# Patient Record
Sex: Female | Born: 1935 | Race: White | Hispanic: No | Smoking: Never smoker
Health system: Southern US, Community
[De-identification: ages and names within clinical notes are randomized; demographics above are authoritative.]

## PROBLEM LIST (undated history)

## (undated) DIAGNOSIS — Q2112 Patent foramen ovale: Secondary | ICD-10-CM

## (undated) DIAGNOSIS — E039 Hypothyroidism, unspecified: Secondary | ICD-10-CM

## (undated) DIAGNOSIS — I313 Pericardial effusion (noninflammatory): Secondary | ICD-10-CM

## (undated) DIAGNOSIS — M1990 Unspecified osteoarthritis, unspecified site: Secondary | ICD-10-CM

## (undated) DIAGNOSIS — I272 Pulmonary hypertension, unspecified: Secondary | ICD-10-CM

## (undated) DIAGNOSIS — R000 Tachycardia, unspecified: Secondary | ICD-10-CM

## (undated) DIAGNOSIS — R002 Palpitations: Secondary | ICD-10-CM

## (undated) DIAGNOSIS — I6529 Occlusion and stenosis of unspecified carotid artery: Secondary | ICD-10-CM

## (undated) DIAGNOSIS — R001 Bradycardia, unspecified: Secondary | ICD-10-CM

## (undated) DIAGNOSIS — I00 Rheumatic fever without heart involvement: Secondary | ICD-10-CM

## (undated) DIAGNOSIS — I071 Rheumatic tricuspid insufficiency: Secondary | ICD-10-CM

## (undated) DIAGNOSIS — I3139 Other pericardial effusion (noninflammatory): Secondary | ICD-10-CM

## (undated) DIAGNOSIS — M316 Other giant cell arteritis: Secondary | ICD-10-CM

## (undated) DIAGNOSIS — I5189 Other ill-defined heart diseases: Secondary | ICD-10-CM

## (undated) DIAGNOSIS — R9430 Abnormal result of cardiovascular function study, unspecified: Secondary | ICD-10-CM

## (undated) DIAGNOSIS — I340 Nonrheumatic mitral (valve) insufficiency: Secondary | ICD-10-CM

## (undated) DIAGNOSIS — R42 Dizziness and giddiness: Secondary | ICD-10-CM

## (undated) DIAGNOSIS — I8393 Asymptomatic varicose veins of bilateral lower extremities: Secondary | ICD-10-CM

## (undated) DIAGNOSIS — M353 Polymyalgia rheumatica: Secondary | ICD-10-CM

## (undated) DIAGNOSIS — I700 Atherosclerosis of aorta: Secondary | ICD-10-CM

## (undated) DIAGNOSIS — I10 Essential (primary) hypertension: Secondary | ICD-10-CM

## (undated) DIAGNOSIS — R29898 Other symptoms and signs involving the musculoskeletal system: Secondary | ICD-10-CM

## (undated) DIAGNOSIS — I491 Atrial premature depolarization: Secondary | ICD-10-CM

## (undated) DIAGNOSIS — I499 Cardiac arrhythmia, unspecified: Secondary | ICD-10-CM

## (undated) DIAGNOSIS — R0989 Other specified symptoms and signs involving the circulatory and respiratory systems: Secondary | ICD-10-CM

## (undated) DIAGNOSIS — I351 Nonrheumatic aortic (valve) insufficiency: Secondary | ICD-10-CM

## (undated) DIAGNOSIS — R252 Cramp and spasm: Secondary | ICD-10-CM

## (undated) HISTORY — DX: Nonrheumatic aortic (valve) insufficiency: I35.1

## (undated) HISTORY — DX: Other giant cell arteritis: M31.6

## (undated) HISTORY — DX: Bradycardia, unspecified: R00.1

## (undated) HISTORY — DX: Pulmonary hypertension, unspecified: I27.20

## (undated) HISTORY — DX: Cramp and spasm: R25.2

## (undated) HISTORY — PX: TONSILLECTOMY: SUR1361

## (undated) HISTORY — DX: Occlusion and stenosis of unspecified carotid artery: I65.29

## (undated) HISTORY — DX: Nonrheumatic mitral (valve) insufficiency: I34.0

## (undated) HISTORY — DX: Other ill-defined heart diseases: I51.89

## (undated) HISTORY — DX: Abnormal result of cardiovascular function study, unspecified: R94.30

## (undated) HISTORY — DX: Hypothyroidism, unspecified: E03.9

## (undated) HISTORY — DX: Rheumatic fever without heart involvement: I00

## (undated) HISTORY — DX: Patent foramen ovale: Q21.12

## (undated) HISTORY — DX: Tachycardia, unspecified: R00.0

## (undated) HISTORY — PX: BACK SURGERY: SHX140

## (undated) HISTORY — DX: Essential (primary) hypertension: I10

## (undated) HISTORY — PX: NECK SURGERY: SHX720

## (undated) HISTORY — DX: Atrial premature depolarization: I49.1

## (undated) HISTORY — PX: TOTAL ABDOMINAL HYSTERECTOMY: SHX209

## (undated) HISTORY — DX: Dizziness and giddiness: R42

## (undated) HISTORY — DX: Polymyalgia rheumatica: M35.3

## (undated) HISTORY — PX: BUNIONECTOMY: SHX129

## (undated) HISTORY — DX: Other symptoms and signs involving the musculoskeletal system: R29.898

## (undated) HISTORY — DX: Atherosclerosis of aorta: I70.0

## (undated) HISTORY — DX: Other specified symptoms and signs involving the circulatory and respiratory systems: R09.89

## (undated) HISTORY — DX: Other pericardial effusion (noninflammatory): I31.39

## (undated) HISTORY — DX: Palpitations: R00.2

## (undated) HISTORY — DX: Pericardial effusion (noninflammatory): I31.3

## (undated) HISTORY — DX: Asymptomatic varicose veins of bilateral lower extremities: I83.93

## (undated) HISTORY — DX: Rheumatic tricuspid insufficiency: I07.1

---

## 2000-02-23 ENCOUNTER — Emergency Department (HOSPITAL_COMMUNITY): Admission: EM | Admit: 2000-02-23 | Discharge: 2000-02-23 | Payer: Self-pay

## 2001-02-24 ENCOUNTER — Encounter: Admission: RE | Admit: 2001-02-24 | Discharge: 2001-05-25 | Payer: Self-pay | Admitting: *Deleted

## 2001-11-08 ENCOUNTER — Ambulatory Visit (HOSPITAL_COMMUNITY): Admission: RE | Admit: 2001-11-08 | Discharge: 2001-11-08 | Payer: Self-pay | Admitting: Gastroenterology

## 2003-02-07 ENCOUNTER — Emergency Department (HOSPITAL_COMMUNITY): Admission: EM | Admit: 2003-02-07 | Discharge: 2003-02-08 | Payer: Self-pay

## 2007-04-27 ENCOUNTER — Ambulatory Visit: Payer: Self-pay | Admitting: Cardiology

## 2007-04-30 ENCOUNTER — Ambulatory Visit: Payer: Self-pay

## 2007-05-21 ENCOUNTER — Ambulatory Visit: Payer: Self-pay | Admitting: Cardiology

## 2007-06-15 ENCOUNTER — Observation Stay (HOSPITAL_COMMUNITY): Admission: EM | Admit: 2007-06-15 | Discharge: 2007-06-16 | Payer: Self-pay | Admitting: Emergency Medicine

## 2007-06-15 ENCOUNTER — Ambulatory Visit: Payer: Self-pay | Admitting: Cardiovascular Disease

## 2007-06-16 ENCOUNTER — Encounter (INDEPENDENT_AMBULATORY_CARE_PROVIDER_SITE_OTHER): Payer: Self-pay | Admitting: Internal Medicine

## 2007-08-18 ENCOUNTER — Ambulatory Visit: Payer: Self-pay | Admitting: Cardiology

## 2008-08-01 DIAGNOSIS — M353 Polymyalgia rheumatica: Secondary | ICD-10-CM | POA: Insufficient documentation

## 2008-08-01 DIAGNOSIS — E039 Hypothyroidism, unspecified: Secondary | ICD-10-CM | POA: Insufficient documentation

## 2008-09-01 ENCOUNTER — Ambulatory Visit: Payer: Self-pay | Admitting: Cardiology

## 2008-09-20 ENCOUNTER — Encounter: Payer: Self-pay | Admitting: Cardiology

## 2008-11-14 ENCOUNTER — Encounter (INDEPENDENT_AMBULATORY_CARE_PROVIDER_SITE_OTHER): Payer: Self-pay | Admitting: *Deleted

## 2009-04-21 DIAGNOSIS — R9430 Abnormal result of cardiovascular function study, unspecified: Secondary | ICD-10-CM

## 2009-04-21 HISTORY — DX: Abnormal result of cardiovascular function study, unspecified: R94.30

## 2010-02-13 ENCOUNTER — Encounter: Payer: Self-pay | Admitting: Cardiology

## 2010-03-05 ENCOUNTER — Encounter: Payer: Self-pay | Admitting: Cardiology

## 2010-03-06 ENCOUNTER — Ambulatory Visit: Payer: Self-pay | Admitting: Cardiology

## 2010-04-04 ENCOUNTER — Ambulatory Visit: Payer: Self-pay | Admitting: Cardiology

## 2010-05-13 ENCOUNTER — Ambulatory Visit
Admission: RE | Admit: 2010-05-13 | Discharge: 2010-05-13 | Payer: Self-pay | Source: Home / Self Care | Attending: Cardiology | Admitting: Cardiology

## 2010-05-19 LAB — CONVERTED CEMR LAB: CRP, High Sensitivity: 2.13 (ref 0.00–5.00)

## 2010-05-21 ENCOUNTER — Ambulatory Visit: Admission: RE | Admit: 2010-05-21 | Discharge: 2010-05-21 | Payer: Self-pay | Source: Home / Self Care

## 2010-05-21 ENCOUNTER — Ambulatory Visit (HOSPITAL_COMMUNITY)
Admission: RE | Admit: 2010-05-21 | Discharge: 2010-05-21 | Payer: Self-pay | Source: Home / Self Care | Attending: Cardiology | Admitting: Cardiology

## 2010-05-23 NOTE — Assessment & Plan Note (Signed)
Summary: f1y/ gd   Visit Type:  1 year follow up Primary Appolonia Ackert:  Juluis Rainier MD  CC:  dizziness.  History of Present Illness: The patient is seen for followup of a history of palpitations.  She is a small pericardial effusion in 2009 and we were not concerned about.  There is a history of polymyalgia rheumatica.  There is question of the patient had leg cramps in the past for Lipitor.  However after further discussion today is possible that she has leg cramps independent of Lipitor.  She does not have exertional claudication.  Recently she has had a few dizzy spells.  She has not had vertigo.  She does have a slow resting heart rate.  Current Medications (verified): 1)  Aspirin 81 Mg Tbec (Aspirin) .... Take One Tablet By Mouth Daily 2)  Multivitamins   Tabs (Multiple Vitamin) .Marland Kitchen.. 1 By Mouth Once Daily 3)  Vitamin B-12 500 Mcg  Tabs (Cyanocobalamin) .Marland Kitchen.. 1 By Mouth Once Daily 4)  Synthroid 100 Mcg Tabs (Levothyroxine Sodium) .... Once Daily 5)  Vitamin D3 1000 Unit Caps (Cholecalciferol) .Marland Kitchen.. 1 Tablet Once Daily 6)  Lipitor 20 Mg Tabs (Atorvastatin Calcium) .... Take One Tablet By Mouth Daily. 7)  Hydrochlorothiazide 25 Mg Tabs (Hydrochlorothiazide) .... Take One Tablet By Mouth Daily. 8)  Prednisone 1 Mg Tabs (Prednisone) .... 2 Tablets Daily 9)  Metoprolol Succinate 25 Mg Xr24h-Tab (Metoprolol Succinate) .... Take One Tablet By Mouth Daily 10)  Tums 500 Mg Chew (Calcium Carbonate Antacid) .Marland Kitchen.. 1 Once Daily 11)  Hydrochlorothiazide 25 Mg Tabs (Hydrochlorothiazide) .... Take One Tablet By Mouth Daily. 12)  Hydroxyzine Pamoate 25 Mg Caps (Hydroxyzine Pamoate) .Marland Kitchen.. 1 Tablet Three Times Daily, As Needed 13)  Voltaren 0.1 % Soln (Diclofenac Sodium) .... As Needed  Allergies (verified): 1)  ! Bactrim 2)  ! Cipro 3)  ! Keflex  Past History:  Past Medical History: PERICARDIAL EFFUSION (ICD-423.9)...small...posterior...echo..04/2007 HYPOTHYROIDISM (ICD-244.9) R/O TEMPORAL  ARTERITIS (ICD-446.5) PAC (ICD-427.61) SINUS TACHYCARDIA (ICD-427.89) PALPITATIONS (ICD-785.1) HYPERTENSION, HX OF (ICD-V12.50) POLYMYALGIA RHEUMATICA (ICD-725) leg cramps from Lipitor in the past Questionable history of rheumatic fever but no history of valvular abnormalities Pulmonary hypertension Mitral regurgitation-mild/moderate..echo..04/2007 Tricuspid regurgitation-moderate...echo..04/2007 EF  60%..echo..04/2007 Diastolic dysfunction-mild Abdominal bruit but no abdominal aortic aneurysm by evaluation in the past Sinus bradycardia   2011 Dizziness   2011  Review of Systems       Patient denies fever, chills, headache, sweats, rash, change in vision, change in hearing, chest pain, cough, nausea vomiting, urinary symptoms.  All of the systems are reviewed and are negative.  Vital Signs:  Patient profile:   76 year old female Height:      66 inches Weight:      142.25 pounds BMI:     23.04 Pulse rate:   48 / minute BP sitting:   138 / 60  (left arm) Cuff size:   regular  Vitals Entered By: Caralee Ates CMA (March 06, 2010 2:52 PM)  Physical Exam  General:  patient is stable today. Head:  head is atraumatic. Eyes:  no xanthelasma. Neck:  no jugular venous distention. Chest Wall:  no chest wall tenderness. Lungs:  lungs are clear.  Respiratory effort is nonlabored. Heart:  cardiac exam reveals an S1-S2.  There is a soft systolic murmur. Abdomen:  abdomen is soft. Msk:  no musculoskeletal deformities. Extremities:  no peripheral edema. Skin:  no skin rashes. Psych:  patient is oriented to person time and place.  Affect is normal.  Impression & Recommendations:  Problem # 1:  DIZZINESS (ICD-780.4) The patient has had some dizziness recently.  This does not sound like vertigo.  She does have sinus bradycardia.  She is on a small dose of the Toprol which we will stop.  Problem # 2:  SINUS BRADYCARDIA (ICD-427.81)  The following medications were removed from the  medication list:    Metoprolol Succinate 25 Mg Xr24h-tab (Metoprolol succinate) .Marland Kitchen... Take one tablet by mouth daily Her updated medication list for this problem includes:    Aspirin 81 Mg Tbec (Aspirin) .Marland Kitchen... Take one tablet by mouth daily With her sinus bradycardia, metoprolol will be stopped. EKG is done today and reviewed by me.  There is marked sinus bradycardia.  Orders: EKG w/ Interpretation (93000)  Problem # 3:  MITRAL REGURGITATION (ICD-396.3) The patient's last echo was in 2009.  I will not arrange to repeat one at this time but keep it in mind  Problem # 4:  PALPITATIONS (ICD-785.1)  The following medications were removed from the medication list:    Metoprolol Succinate 25 Mg Xr24h-tab (Metoprolol succinate) .Marland Kitchen... Take one tablet by mouth daily Her updated medication list for this problem includes:    Aspirin 81 Mg Tbec (Aspirin) .Marland Kitchen... Take one tablet by mouth daily The patient is not having any significant palpitations.  No further workup.  Problem # 5:  HYPERTENSION, HX OF (ICD-V12.50) blood pressure is controlled today.  No change in therapy.  Problem # 6:  * LEG CRAMPS AND  The patient has leg cramps.  This does not sound like claudication to me.  It is of note that she had some leg cramps in the past that led to Lipitor being stopped.  It is possible that she may not have a statin intolerance.  There is a strong family history of coronary disease.  I will obtain the results of her recent blood tests from her primary physician.  The patient asked if we could do a CRP after her reading.  With the combination of the CRP and her other labs I will talk to her about the possibility of trying a statin if appropriate.  I will see her back in 4 weeks to review this information.  Orders: EKG w/ Interpretation (93000)  Other Orders: TLB-CRP-High Sensitivity (C-Reactive Protein) (86140-FCRP)  Patient Instructions: 1)  Stop Metoprolol 2)  Lab today 3)  Your physician has  requested that you regularly monitor and record your blood pressure readings at home.  Please use the same machine at the same time of day to check your readings and record them to bring to your follow-up visit. 4)  Follow up in 4 weeks

## 2010-05-23 NOTE — Miscellaneous (Signed)
  Clinical Lists Changes  Observations: Added new observation of PAST MED HX: PERICARDIAL EFFUSION (ICD-423.9)...small...posterior...echo..04/2007 HYPOTHYROIDISM (ICD-244.9) R/O TEMPORAL ARTERITIS (ICD-446.5) PAC (ICD-427.61) SINUS TACHYCARDIA (ICD-427.89) PALPITATIONS (ICD-785.1) HYPERTENSION, HX OF (ICD-V12.50) POLYMYALGIA RHEUMATICA (ICD-725) leg cramps from Lipitor in the past Questionable history of rheumatic fever but no history of valvular abnormalities Pulmonary hypertension Mitral regurgitation-mild/moderate..echo..04/2007 Tricuspid regurgitation-moderate...echo..04/2007 EF  60%..echo..04/2007 Diastolic dysfunction-mild Abdominal bruit but no abdominal aortic aneurysm by evaluation in the past (03/05/2010 18:05) Added new observation of PRIMARY MD: Juluis Rainier MD (03/05/2010 18:05)       Past History:  Past Medical History: PERICARDIAL EFFUSION (ICD-423.9)...small...posterior...echo..04/2007 HYPOTHYROIDISM (ICD-244.9) R/O TEMPORAL ARTERITIS (ICD-446.5) PAC (ICD-427.61) SINUS TACHYCARDIA (ICD-427.89) PALPITATIONS (ICD-785.1) HYPERTENSION, HX OF (ICD-V12.50) POLYMYALGIA RHEUMATICA (ICD-725) leg cramps from Lipitor in the past Questionable history of rheumatic fever but no history of valvular abnormalities Pulmonary hypertension Mitral regurgitation-mild/moderate..echo..04/2007 Tricuspid regurgitation-moderate...echo..04/2007 EF  60%..echo..04/2007 Diastolic dysfunction-mild Abdominal bruit but no abdominal aortic aneurysm by evaluation in the past

## 2010-05-23 NOTE — Assessment & Plan Note (Signed)
Summary: 1 month rov.sl   Visit Type:  Follow-up Primary Brenin Heidelberger:  Juluis Rainier MD  CC:  dizziness.  History of Present Illness: Patient is seen for followup of mild dizziness.  When I saw her last she had some bradycardia.  We put her beta blocker on hold.  She says she is not having any significant dizziness now.  I am not sure if this is related to stopping the beta blocker or not.  She has checked her blood pressure and brought the list.  Pressures range from 160/82 to 125/60.  Pulse ranges from 53-91.  She does not know any significant palpitations.  Current Medications (verified): 1)  Aspirin 81 Mg Tbec (Aspirin) .... Take One Tablet By Mouth Daily 2)  Multivitamins   Tabs (Multiple Vitamin) .Marland Kitchen.. 1 By Mouth Once Daily 3)  Vitamin B-12 500 Mcg  Tabs (Cyanocobalamin) .Marland Kitchen.. 1 By Mouth Once Daily 4)  Synthroid 100 Mcg Tabs (Levothyroxine Sodium) .... Once Daily 5)  Vitamin D3 1000 Unit Caps (Cholecalciferol) .Marland Kitchen.. 1 Tablet Once Daily 6)  Lipitor 20 Mg Tabs (Atorvastatin Calcium) .... Take One Tablet By Mouth Daily. 7)  Hydrochlorothiazide 25 Mg Tabs (Hydrochlorothiazide) .... Take One Tablet By Mouth Daily. 8)  Prednisone 1 Mg Tabs (Prednisone) .Marland Kitchen.. 1 Tablets Daily 9)  Tums 500 Mg Chew (Calcium Carbonate Antacid) .Marland Kitchen.. 1 Once Daily 10)  Hydroxyzine Pamoate 25 Mg Caps (Hydroxyzine Pamoate) .Marland Kitchen.. 1 Tablet Three Times Daily, As Needed 11)  Voltaren 0.1 % Soln (Diclofenac Sodium) .... As Needed  Allergies (verified): 1)  ! Bactrim 2)  ! Cipro 3)  ! Keflex  Past History:  Past Medical History: PERICARDIAL EFFUSION (ICD-423.9)...small...posterior...echo..04/2007 HYPOTHYROIDISM (ICD-244.9) R/O TEMPORAL ARTERITIS (ICD-446.5) PAC (ICD-427.61) SINUS TACHYCARDIA (ICD-427.89) PALPITATIONS (ICD-785.1) HYPERTENSION, HX OF (ICD-V12.50) POLYMYALGIA RHEUMATICA (ICD-725) leg cramps from Lipitor in the past Questionable history of rheumatic fever but no history of valvular  abnormalities Pulmonary hypertension Mitral regurgitation-mild/moderate..echo..04/2007 Tricuspid regurgitation-moderate...echo..04/2007 EF  60%..echo..04/2007 Diastolic dysfunction-mild Abdominal bruit but no abdominal aortic aneurysm by evaluation in the past Sinus bradycardia   2011 Dizziness   2011.Marland Kitchen  Review of Systems       Patient denies fever, chills, headache, sweats, rash, change in vision, change in hearing, chest pain, cough, nausea vomiting, urinary symptoms.  All other systems are reviewed and are negative.  Vital Signs:  Patient profile:   75 year old female Height:      66 inches Weight:      142 pounds BMI:     23.00 Pulse rate:   60 / minute BP sitting:   124 / 56  (left arm) Cuff size:   regular  Vitals Entered By: Hardin Negus, RMA (April 04, 2010 10:46 AM)  Physical Exam  General:  she is stable today. Eyes:  no xanthelasma. Neck:  no jugular venous distention. Lungs:  lungs are clear respiratory effort is nonlabored. Heart:  cardiac exam reveals S1-S2.  No clicks.  There is a soft systolic murmur. Abdomen:  abdomen is soft. Extremities:  no peripheral edema. Psych:  patient is oriented to person time and place.  Affect is normal.   Impression & Recommendations:  Problem # 1:  DIZZINESS (ICD-780.4) She's not having dizziness.  I do not know this is related to changing her beta blocker.  Problem # 2:  MITRAL REGURGITATION (ICD-396.3) We will arrange for followup echo Doppler when I see her next time.  Problem # 3:  HYPERTENSION, HX OF (ICD-V12.50) Blood pressure is controlled today.  However her  blood pressures show that we can probably do a little better.  I will put her on diltiazem 120 long-acting and see her back for follow up of blood pressure and see how her dizziness is doing and consider followup echo to reassess her valvular function.  Patient Instructions: 1)  Start Diltiazem 120mg  daily 2)  Follow up in 6  weeks Prescriptions: DILTIAZEM HCL CR 120 MG XR12H-CAP (DILTIAZEM HCL) Take 1 capsule by mouth once a day  #30 x 6   Entered by:   Meredith Staggers, RN   Authorized by:   Talitha Givens, MD, [facility]   Signed by:   Meredith Staggers, RN on 04/04/2010   Method used:   Electronically to        [facility] (retail)       [address]       [place], [place]  [address]       Ph: [phone]       Fax: [phone]   RxID:   [id number]

## 2010-05-23 NOTE — Assessment & Plan Note (Signed)
Summary: 6wk f/u sl   Visit Type:  Follow-up Primary Provider:  Juluis Rainier MD  CC:  hypertension, arm discomfort, and valvular heart disease.  History of Present Illness: The patient is seen today for followup hypertension and bradycardia, to assess arm discomfort, to follow valvular heart disease.  I saw her last December 15, 011.  We decided to add diltiazem at that time.  She brought in blood pressures today.  Her diastolic pressure ranges from the 50s to 85.  On Advair occasion her diastolic is 90.  Systolic pressure ranges from 956-213.  At times her systolic is higher.  Overall these blood pressures appear to be reasonable.  Patient mentions today that she's had some episodes of arm weakness.  This can occur with exercise.  She does not have associated chest discomfort.  There is some shortness of breath with exercise.  Current Medications (verified): 1)  Aspirin 81 Mg Tbec (Aspirin) .... Take One Tablet By Mouth Daily 2)  Multivitamins   Tabs (Multiple Vitamin) .Marland Kitchen.. 1 By Mouth Once Daily 3)  Vitamin B-12 500 Mcg  Tabs (Cyanocobalamin) .Marland Kitchen.. 1 By Mouth Once Daily 4)  Synthroid 100 Mcg Tabs (Levothyroxine Sodium) .... Once Daily 5)  Vitamin D3 1000 Unit Caps (Cholecalciferol) .Marland Kitchen.. 1 Tablet Once Daily 6)  Lipitor 20 Mg Tabs (Atorvastatin Calcium) .... Take One Tablet By Mouth Daily. 7)  Hydrochlorothiazide 25 Mg Tabs (Hydrochlorothiazide) .... Take One Tablet By Mouth Daily. 8)  Tums 500 Mg Chew (Calcium Carbonate Antacid) .Marland Kitchen.. 1 Once Daily 9)  Hydroxyzine Pamoate 25 Mg Caps (Hydroxyzine Pamoate) .Marland Kitchen.. 1 Tablet Three Times Daily, As Needed 10)  Voltaren 0.1 % Soln (Diclofenac Sodium) .... As Needed 11)  Diltiazem Hcl Cr 120 Mg Xr12h-Cap (Diltiazem Hcl) .... Take 1 Capsule By Mouth Once A Day 12)  Fish Oil   Oil (Fish Oil) .... Once Daily 13)  Hyomax-Dt 0.375 Mg Cr-Tabs (Hyoscyamine Sulfate) .Marland Kitchen.. 1-2 Tabs Once Daily As Needed  Allergies (verified): 1)  ! Bactrim 2)  ! Cipro 3)   ! Keflex 4)  ! Sulfa 5)  ! Lisinopril  Past History:  Past Medical History: PERICARDIAL EFFUSION (ICD-423.9)...small...posterior...echo..04/2007 HYPOTHYROIDISM (ICD-244.9) R/O TEMPORAL ARTERITIS (ICD-446.5) PAC (ICD-427.61) SINUS TACHYCARDIA (ICD-427.89) PALPITATIONS (ICD-785.1) HYPERTENSION, HX OF (ICD-V12.50) POLYMYALGIA RHEUMATICA (ICD-725) leg cramps from Lipitor in the past Questionable history of rheumatic fever but no history of valvular abnormalities Pulmonary hypertension Mitral regurgitation-mild/moderate..echo..04/2007 Tricuspid regurgitation-moderate...echo..04/2007 EF  60%..echo..04/2007 Diastolic dysfunction-mild Abdominal bruit but no abdominal aortic aneurysm by evaluation in the past Sinus bradycardia   2011 Dizziness   2011.. Arm weakness with exercise   January, 2012  Past Surgical History: foot (bunions) neck Abdominal Hysterectomy-Total Back Surgery x 2  Review of Systems       Patient denies fever, chills, headache, sweats, rash, change in vision, change in hearing, chest pain, cough, nausea vomiting, urinary symptoms.  All other systems are reviewed and are negative.  Vital Signs:  Patient profile:   76 year old female Height:      66 inches Weight:      144 pounds BMI:     23.33 Pulse rate:   56 / minute BP sitting:   124 / 68  (right arm) Cuff size:   regular  Vitals Entered By: Hardin Negus, RMA (May 13, 2010 10:17 AM)  Physical Exam  General:  patient is stable today. Eyes:  no xanthelasma. Neck:  no jugular venous distention. Lungs:  lungs are clear.  Respiratory effort is nonlabored. Heart:  cardiac exam reveals S1-S2.  There is a soft systolic murmur. Abdomen:  abdomen soft. Extremities:  no peripheral edema. Psych:  patient is oriented to person time and place.  Affect is normal.   Impression & Recommendations:  Problem # 1:  * ARM WEAKNESS WITH EXERCISE This will be followed up over time to see if she has more episodes.   At some point we will have to decide if this is anginal equivalent  Problem # 2:  DIZZINESS (ICD-780.4) The patient has not had any further dizziness and no further workup.  Problem # 3:  SINUS BRADYCARDIA (ICD-427.81)  Her updated medication list for this problem includes:    Aspirin 81 Mg Tbec (Aspirin) .Marland Kitchen... Take one tablet by mouth daily    Diltiazem Hcl Cr 120 Mg Xr12h-cap (Diltiazem hcl) .Marland Kitchen... Take 1 capsule by mouth once a day The rate is slow but stable.  No further workup.  Problem # 4:  MITRAL REGURGITATION (ICD-396.3)  And is now time for followup 2-D echo to reassess her valvular heart disease.  It has been 3 years.  Since the last echo.  Orders: Echocardiogram (Echo)  Problem # 5:  HYPERTENSION, HX OF (ICD-V12.50) Currently her blood pressure is stable.  She will gather more information for me as an outpatient.  Patient Instructions: 1)  Your physician recommends that you schedule a follow-up appointment in: 6 weeks 2)  Your physician has requested that you have an echocardiogram.  Echocardiography is a painless test that uses sound waves to create images of your heart. It provides your doctor with information about the size and shape of your heart and how well your heart's chambers and valves are working.  This procedure takes approximately one hour. There are no restrictions for this procedure.

## 2010-05-23 NOTE — Letter (Signed)
Summary: [facility] Visit Note   [facility] Physicians Office Visit Note   Imported By: Roderic Ovens 03/28/2010 09:52:36  _____________________________________________________________________  External Attachment:    Type:   Image     Comment:   External Document

## 2010-06-28 ENCOUNTER — Other Ambulatory Visit (HOSPITAL_COMMUNITY): Payer: Self-pay

## 2010-06-28 ENCOUNTER — Ambulatory Visit: Payer: Self-pay | Admitting: Internal Medicine

## 2010-07-07 ENCOUNTER — Encounter: Payer: Self-pay | Admitting: Cardiology

## 2010-07-08 ENCOUNTER — Ambulatory Visit (INDEPENDENT_AMBULATORY_CARE_PROVIDER_SITE_OTHER): Payer: Medicare Other | Admitting: Cardiology

## 2010-07-08 ENCOUNTER — Encounter: Payer: Self-pay | Admitting: Cardiology

## 2010-07-08 DIAGNOSIS — I10 Essential (primary) hypertension: Secondary | ICD-10-CM

## 2010-07-18 NOTE — Assessment & Plan Note (Signed)
Summary: f 6wks/ per check out/saf   Visit Type:  Follow-up Primary Provider:  Juluis Rainier MD  CC:  hypertension and bradycardia.  History of Present Illness: Patient is seen for followup of hypertension and bradycardia.  She also arm  discomfort.  She also has mild mitral regurgitation. I. saw her last we decided to arrange for a followup 2-D echo to reassess her mitral regurgitation.  This was done in January, 2012.  Mitral regurgitation is mild.  Ejection fraction is 55-60%.  She's feeling well.  However she is very bothered by her constipation.  This is worse since we started diltiazem.  Diltiazem was started for blood pressure.  We will need to find another medication.  Current Medications (verified): 1)  Aspirin 81 Mg Tbec (Aspirin) .... Take One Tablet By Mouth Daily 2)  Multivitamins   Tabs (Multiple Vitamin) .Marland Kitchen.. 1 By Mouth Once Daily 3)  Vitamin B-12 500 Mcg  Tabs (Cyanocobalamin) .Marland Kitchen.. 1 By Mouth Once Daily 4)  Synthroid 100 Mcg Tabs (Levothyroxine Sodium) .... Once Daily 5)  Vitamin D3 1000 Unit Caps (Cholecalciferol) .Marland Kitchen.. 1 Tablet Once Daily 6)  Lipitor 20 Mg Tabs (Atorvastatin Calcium) .... Take One Tablet By Mouth Daily. 7)  Hydrochlorothiazide 25 Mg Tabs (Hydrochlorothiazide) .... Take One Tablet By Mouth Daily. 8)  Tums 500 Mg Chew (Calcium Carbonate Antacid) .Marland Kitchen.. 1 Once Daily 9)  Hydroxyzine Pamoate 25 Mg Caps (Hydroxyzine Pamoate) .Marland Kitchen.. 1 Tablet Three Times Daily, As Needed 10)  Voltaren 0.1 % Soln (Diclofenac Sodium) .... As Needed 11)  Diltiazem Hcl Cr 120 Mg Xr12h-Cap (Diltiazem Hcl) .... Take 1 Capsule By Mouth Once A Day 12)  Fish Oil   Oil (Fish Oil) .... Once Daily 13)  Hyomax-Dt 0.375 Mg Cr-Tabs (Hyoscyamine Sulfate) .Marland Kitchen.. 1-2 Tabs Once Daily As Needed 14)  Co Q-10 30 Mg  Caps (Coenzyme Q10) .... 00mg  Daily  Allergies (verified): 1)  ! Bactrim 2)  ! Cipro 3)  ! Keflex 4)  ! Sulfa 5)  ! Lisinopril  Past History:  Past Medical History: PERICARDIAL  EFFUSION (ICD-423.9)...small...posterior...echo..04/2007 HYPOTHYROIDISM (ICD-244.9) R/O TEMPORAL ARTERITIS (ICD-446.5) PAC (ICD-427.61) SINUS TACHYCARDIA (ICD-427.89) PALPITATIONS (ICD-785.1) HYPERTENSION, HX OF (ICD-V12.50) POLYMYALGIA RHEUMATICA (ICD-725) leg cramps from Lipitor in the past Questionable history of rheumatic fever but no history of valvular abnormalities Pulmonary hypertension Aortic Regurg....mild...echo...04/2010 Mitral regurgitation-mild/moderate..echo..04/2007  /  mild..04/2010 Tricuspid regurgitation-moderate...echo..04/2007 EF  60%..echo..04/2007  /   55-60%....echo..05/21/2010 Diastolic dysfunction-mild Abdominal bruit but no abdominal aortic aneurysm by evaluation in the past Sinus bradycardia   2011 Dizziness   2011.. Arm weakness with exercise   January, 2012 Constipation   worse with diltiazem.... March, 2012.  Review of Systems       Patient denies fever, chills, headache, sweats, rash, change in vision, change in hearing, chest pain, cough, nausea vomiting, urinary symptoms.  All other systems are reviewed and are negative.  A  Vital Signs:  Patient profile:   76 year old female Height:      66 inches Weight:      141 pounds BMI:     22.84 Pulse rate:   48 / minute BP sitting:   124 / 65  (left arm) Cuff size:   regular  Vitals Entered By: Hardin Negus, RMA (July 08, 2010 10:30 AM)  Physical Exam  General:  Patient stable. Eyes:  no xanthelasma. Neck:  no jugular venous distention. Lungs:  lungs are clear.  Respiratory effort is nonlabored. Heart:  cardiac exam reveals an S1-S2.  No clicks.  There is a soft systolic murmur. Abdomen:  abdomen is soft. Extremities:  no peripheral edema. Psych:  patient is oriented to person time and place.  Affect is normal.   Impression & Recommendations:  Problem # 1:  * ARM WEAKNESS WITH EXERCISE She is not having any further difficulty with this.  Problem # 2:  DIZZINESS (ICD-780.4) There's been no  significant dizziness.  Problem # 3:  SINUS BRADYCARDIA (ICD-427.81) The patient does have a slow resting heart rate.  No change in therapy.  Problem # 4:  MITRAL REGURGITATION (ICD-396.3) Mitral regurgitation remains mild by followup echo.  No further workup.  Problem # 5:  HYPERTENSION, HX OF (ICD-V12.50) The patient has significant constipation from the diltiazem.  We will switch her to a small dose of amlodipine.  Patient Instructions: 1)  Stop Diltiazem 2)  Start Amlodipine 2.5mg  daily 3)  Follow up in 8 weeks Prescriptions: AMLODIPINE BESYLATE 2.5 MG TABS (AMLODIPINE BESYLATE) Take one tablet by mouth daily  #30 x 6   Entered by:   Meredith Staggers, RN   Authorized by:   Talitha Givens, MD, [facility]   Signed by:   Meredith Staggers, RN on 07/08/2010   Method used:   Electronically to        [facility] (retail)       [address]       [place], [place]  [address]       Ph: [phone]       Fax: [phone]   RxID:   [id number]

## 2010-07-18 NOTE — Miscellaneous (Signed)
  Clinical Lists Changes  Observations: Added new observation of PAST MED HX: PERICARDIAL EFFUSION (ICD-423.9)...small...posterior...echo..04/2007 HYPOTHYROIDISM (ICD-244.9) R/O TEMPORAL ARTERITIS (ICD-446.5) PAC (ICD-427.61) SINUS TACHYCARDIA (ICD-427.89) PALPITATIONS (ICD-785.1) HYPERTENSION, HX OF (ICD-V12.50) POLYMYALGIA RHEUMATICA (ICD-725) leg cramps from Lipitor in the past Questionable history of rheumatic fever but no history of valvular abnormalities Pulmonary hypertension Aortic Regurg....mild...echo...04/2010 Mitral regurgitation-mild/moderate..echo..04/2007  /  mild..04/2010 Tricuspid regurgitation-moderate...echo..04/2007 EF  60%..echo..04/2007  /   55-60%....echo..05/21/2010 Diastolic dysfunction-mild Abdominal bruit but no abdominal aortic aneurysm by evaluation in the past Sinus bradycardia   2011 Dizziness   2011.. Arm weakness with exercise   January, 2012 (07/07/2010 16:03) Added new observation of PRIMARY MD: Juluis Rainier MD (07/07/2010 16:03)       Past History:  Past Medical History: PERICARDIAL EFFUSION (ICD-423.9)...small...posterior...echo..04/2007 HYPOTHYROIDISM (ICD-244.9) R/O TEMPORAL ARTERITIS (ICD-446.5) PAC (ICD-427.61) SINUS TACHYCARDIA (ICD-427.89) PALPITATIONS (ICD-785.1) HYPERTENSION, HX OF (ICD-V12.50) POLYMYALGIA RHEUMATICA (ICD-725) leg cramps from Lipitor in the past Questionable history of rheumatic fever but no history of valvular abnormalities Pulmonary hypertension Aortic Regurg....mild...echo...04/2010 Mitral regurgitation-mild/moderate..echo..04/2007  /  mild..04/2010 Tricuspid regurgitation-moderate...echo..04/2007 EF  60%..echo..04/2007  /   55-60%....echo..05/21/2010 Diastolic dysfunction-mild Abdominal bruit but no abdominal aortic aneurysm by evaluation in the past Sinus bradycardia   2011 Dizziness   2011.. Arm weakness with exercise   January, 2012

## 2010-08-31 ENCOUNTER — Encounter: Payer: Self-pay | Admitting: *Deleted

## 2010-08-31 ENCOUNTER — Encounter: Payer: Self-pay | Admitting: Cardiology

## 2010-09-02 ENCOUNTER — Encounter: Payer: Self-pay | Admitting: Cardiology

## 2010-09-02 DIAGNOSIS — R0989 Other specified symptoms and signs involving the circulatory and respiratory systems: Secondary | ICD-10-CM | POA: Insufficient documentation

## 2010-09-02 DIAGNOSIS — R9430 Abnormal result of cardiovascular function study, unspecified: Secondary | ICD-10-CM | POA: Insufficient documentation

## 2010-09-02 DIAGNOSIS — I313 Pericardial effusion (noninflammatory): Secondary | ICD-10-CM | POA: Insufficient documentation

## 2010-09-02 DIAGNOSIS — R42 Dizziness and giddiness: Secondary | ICD-10-CM | POA: Insufficient documentation

## 2010-09-02 DIAGNOSIS — I00 Rheumatic fever without heart involvement: Secondary | ICD-10-CM | POA: Insufficient documentation

## 2010-09-02 DIAGNOSIS — I272 Pulmonary hypertension, unspecified: Secondary | ICD-10-CM | POA: Insufficient documentation

## 2010-09-02 DIAGNOSIS — R29898 Other symptoms and signs involving the musculoskeletal system: Secondary | ICD-10-CM | POA: Insufficient documentation

## 2010-09-02 DIAGNOSIS — I351 Nonrheumatic aortic (valve) insufficiency: Secondary | ICD-10-CM | POA: Insufficient documentation

## 2010-09-02 DIAGNOSIS — R000 Tachycardia, unspecified: Secondary | ICD-10-CM | POA: Insufficient documentation

## 2010-09-02 DIAGNOSIS — R001 Bradycardia, unspecified: Secondary | ICD-10-CM | POA: Insufficient documentation

## 2010-09-02 DIAGNOSIS — R252 Cramp and spasm: Secondary | ICD-10-CM | POA: Insufficient documentation

## 2010-09-02 DIAGNOSIS — M316 Other giant cell arteritis: Secondary | ICD-10-CM | POA: Insufficient documentation

## 2010-09-02 DIAGNOSIS — I491 Atrial premature depolarization: Secondary | ICD-10-CM | POA: Insufficient documentation

## 2010-09-02 DIAGNOSIS — K5900 Constipation, unspecified: Secondary | ICD-10-CM | POA: Insufficient documentation

## 2010-09-02 DIAGNOSIS — I5189 Other ill-defined heart diseases: Secondary | ICD-10-CM | POA: Insufficient documentation

## 2010-09-02 DIAGNOSIS — R002 Palpitations: Secondary | ICD-10-CM | POA: Insufficient documentation

## 2010-09-03 ENCOUNTER — Encounter: Payer: Self-pay | Admitting: Cardiology

## 2010-09-03 ENCOUNTER — Ambulatory Visit (INDEPENDENT_AMBULATORY_CARE_PROVIDER_SITE_OTHER): Payer: Medicare Other | Admitting: Cardiology

## 2010-09-03 DIAGNOSIS — I498 Other specified cardiac arrhythmias: Secondary | ICD-10-CM

## 2010-09-03 DIAGNOSIS — I10 Essential (primary) hypertension: Secondary | ICD-10-CM

## 2010-09-03 DIAGNOSIS — R29898 Other symptoms and signs involving the musculoskeletal system: Secondary | ICD-10-CM

## 2010-09-03 DIAGNOSIS — R001 Bradycardia, unspecified: Secondary | ICD-10-CM

## 2010-09-03 DIAGNOSIS — R42 Dizziness and giddiness: Secondary | ICD-10-CM

## 2010-09-03 DIAGNOSIS — K5900 Constipation, unspecified: Secondary | ICD-10-CM

## 2010-09-03 MED ORDER — AMLODIPINE BESYLATE 2.5 MG PO TABS: 2.5000 mg | ORAL_TABLET | Freq: Every day | ORAL | Status: DC

## 2010-09-03 NOTE — Assessment & Plan Note (Signed)
She does not have any significant dizziness.  No change in therapy.

## 2010-09-03 NOTE — Assessment & Plan Note (Signed)
Constipation is improved.  No change in therapy.

## 2010-09-03 NOTE — H&P (Signed)
[name], BREED NO.:  [id number]   MEDICAL RECORD NO.:  [id number]          PATIENT TYPE:  EMS   LOCATION:  ED                           FACILITY:  [facility]   PHYSICIAN:  Corinna L. Lendell Caprice, MDDATE OF BIRTH:  [birth year]   DATE OF ADMISSION:  06/15/2007  DATE OF DISCHARGE:                              HISTORY & PHYSICAL   CHIEF COMPLAINT:  Allergic reaction to Cipro.   HISTORY OF PRESENT ILLNESS:  Samantha Henderson is a 76 year old white female, a  patient of Dr. Zachery Dauer, who presents to the emergency room with the above  complaint.  She was diagnosed with a urinary tract infection on Friday  and started Cipro.  She has a history of polymyalgia rheumatica and was  seen by Dr. Coral Spikes yesterday.  Last night she began having a sensation  of swelling of her tongue and it felt difficult to swallow.  She had no  shortness of breath.  She has pleuritic substernal chest pain.  She  started having a rash on her left buttock within the past few days.  It  does not hurt.  It is pruritic.  Her muscle and joint aches had worsened  last week.  She called in to Dr. Katina Degree office and spoke to his  nurse.  She had been instructed by the nurse to increase her prednisone  from 5 mg b.i.d. to 7.5 mg b.i.d.  This helped with her pain, and when  she was seen in the office yesterday her pain was improved, and it was  felt that she could decrease her prednisone back to 5 mg twice a day.  The patient, however, complained of some jaw claudication and left  temporal pain and is to have a temporal artery biopsy by Dr. Freida Busman this  Thursday.  The patient apparently was not felt to be having an allergic  reaction by the ED physician as she had received only pain medications.  She did have an elevated D-dimer, and therefore a CAT scan of the chest  was done, which showed no PE but a moderate perricardial effusion.  Dr.  Linwood Dibbles subsequently consulted [facility] for possible  pericarditis.  The [facility] PA has seen the patient and has discussed the  case with Dr. Eden Emms, but the patient has not been seen by him at this  point.  She has a known pericardial effusion based on [facility]  office notes.  Apparently at that time it was small at the end of  January.  She was being seen by [facility] for palpitations and  was found to have periods of sinus tachycardia and PACs and was  prescribed metoprolol.  The patient has received several doses of  morphine and a dose of Ativan and Dilaudid with only slight relief.  She  has no shortness of breath, no wheezing.   PAST MEDICAL HISTORY:  Polymyalgia rheumatica, hypertension,  palpitations secondary to sinus tachycardia and PACs.  Rule out temporal  arteritis.  Hypertension.  Hypothyroidism.  Recent urinary tract  infection.   MEDICATIONS:  Prednisone 5 mg  p.o. b.i.d., Synthroid 112 mcg a day,  hyoscyamine sulfate 0.375 mg a day, Toprol XL 50 mg a day, aspirin 81 mg  a day, hydrochlorothiazide 25 mg a day, lisinopril 5 mg a day.   ALLERGIES:  SHE REPORTS AN ALLERGY TO KEFLEX, WHICH CAUSES A RASH.   SOCIAL HISTORY:  She does not drink or smoke.  She is married and here  with her husband.   FAMILY HISTORY:  Noncontributory.   REVIEW OF SYSTEMS:  As above, otherwise negative.   PHYSICAL EXAMINATION:  VITAL SIGNS:  Her temperature initially was  normal but since she has been in the emergency room has increased to  101.3, blood pressure 134/76, pulse 96, respiratory rate 24.  Oxygen  saturation 100% on room air.  GENERAL:  The patient is a very uncomfortable-appearing white female who  is occasionally grunting and writhing in pain.  HEENT:  Normocephalic, atraumatic.  Pupils equal, round, reactive to  light.  Sclerae nonicteric.  She has slightly dry mucous membranes.  There is no obvious tongue swelling.  No thrush.  No airway obstruction.  NECK:  Supple.  No lymphadenopathy.  No JVD.  LUNGS:   Clear to auscultation bilaterally without wheezes, rhonchi or  rales.  CARDIOVASCULAR:  Regular rate and rhythm without murmurs, gallops or  rubs.  ABDOMEN:  Normal bowel sounds.  Soft, nontender, nondistended.  GENITOURINARY/RECTAL:  Deferred.  EXTREMITIES:  No clubbing, cyanosis or edema.  SKIN:  She has erythematous plaques with some central clearing in  multiple areas of the left buttock, which appear to be urticaria.  She  has some tiny papules sparsely across her abdomen which appear to be  excoriated.  NEUROLOGIC:  Alert and oriented.  Cranial nerves and sensorimotor exam  are intact.  PSYCHIATRIC:  Normal affect.  MUSCULOSKELETAL:  She has decreased range of motion in her shoulders due  to pain and has a difficult time sitting up or turning over due to pain,  but no obvious deformities or effusions.   LABORATORY DATA:  CBC unremarkable.  D-dimer 3.89.  Basic metabolic  panel significant for a BUN of 29.  Her creatinine is 1.09.  Point-of-  care enzymes negative.  EKG shows normal sinus rhythm.  Cannot rule out  anterior infarct.  Chest x-ray shows cardiomegaly and bibasilar  atelectasis.  CT angiogram of the chest shows no pulmonary embolus,  moderate pericardial effusion, small hiatal hernia, multiple cystic  lesions in the liver.   ASSESSMENT/PLAN:  1. Drug reaction secondary to Cipro.  I have reviewed the urine      culture done as an outpatient 4 days ago.  She had greater than      100,000 colonies of E. coli, which was sensitive to everything.  I      will give Macrobid, she is allergic to cephalosporins, and also      give Benadryl, Zantac.  At this point I do not think she needs high-      dose steroids and shows no signs of airway compromise.  She will be      placed on 23-hour observation.  2. Severe pain.  Her ESR done yesterday in Dr. Katina Degree was only 18.      Her C-reactive protein was 6.3.  He recommends increasing her      prednisone to 7.5 mg p.o. b.i.d.  because that seemed to help last      week,  but I question whether a lot of her pain is related  to      allergic reaction to Cipro.  3. Elevated BUN/dehydration.  I will hold her hydrochlorothiazide and      give gentle IV fluids.  4. Polymyalgia rheumatica.  See above.  5. Hypothyroidism.  Check TSH.  6. Escherichia coli urinary tract infection.  See above.  7. Pericardial effusion per [facility].  8. History of palpitations.  9. Hypertension.  10.Pending temporal artery biopsy on Thursday.      Corinna L. Lendell Caprice, MD  Electronically Signed     CLS/MEDQ  D:  06/15/2007  T:  06/15/2007  Job:  [id number]   cc:   Dossie Der, MD  Fax: [phone]   Demetria Pore. Coral Spikes, M.D.  Fax: [phone]   [name], MD, [facility]  [address]  [address]  [place]  [place] [address]   Luis Abed, MD, [facility]  [address]  [address]  [place]  [place] [address]

## 2010-09-03 NOTE — Assessment & Plan Note (Signed)
She is not having any significant problems with arm weakness.  No change in therapy.

## 2010-09-03 NOTE — Assessment & Plan Note (Signed)
Samantha Henderson                            CARDIOLOGY OFFICE NOTE   Samantha Henderson, Samantha Henderson                          MRN:          [id number]  DATE:04/27/2007                            DOB:          [birth year]    Samantha Henderson as an add-on consultation today.  This woman is 76  years old and is approaching Samantha Henderson 50th wedding anniversary.  In fact,  Samantha Henderson and Samantha Henderson husband just bought a Therapist, art with a personalize licence  plate with both of their names on it.   CHIEF COMPLAINT:  Palpitations.   HISTORY OF PRESENT ILLNESS:  Samantha Henderson is a lady that has been Henderson by  Dr. Myrtis Ser in the past.  On Saturday and Sunday, Samantha Henderson noticed an irregular  heartbeat that lasted for nearly a day and half.  Samantha Henderson has had this in  the past, but it has not been as frequent as this.  Samantha Henderson did not quite  feel right when Samantha Henderson was having this, although Samantha Henderson denies any specific  chest pain.  The the symptoms essentially resolved, and Samantha Henderson was Henderson by  Dr. Zachery Dauer today in the office.  Samantha Henderson has had prior workup.  Samantha Henderson saw Dr.  Myrtis Ser previously at the request of Dr. Janey Greaser.  Samantha Henderson had been Henderson by Dr.  Myrtis Ser in consultation in November 2004. Samantha Henderson felt poorly at that time and  had some dizziness.  Samantha Henderson had some hypertension.  Samantha Henderson went to the  emergency room.  CT was done.  Samantha Henderson eventually saw Dr. Myrtis Ser back,  underwent a 2-D echocardiogram.  A 2-D echocardiogram demonstrated  normal left ventricular size and function with borderline LVH.  There  was mitral annular calcification with mild MR, and mild aortic  regurgitation and there was two plus TR with RV systolic pressure 440  range.  Further follow-up was not thought to be necessary and Samantha Henderson has  not been Henderson in the interim.   Samantha Henderson is now feeling quite a bit better and Samantha Henderson and Dr. Myrtis Ser and I and Samantha Henderson  husband had a brief chat after Samantha Henderson office visit today.   Samantha Henderson past medical history is remarkable for prior thyroidectomy, Samantha Henderson has  had back surgery, a  hysterectomy, and foot surgery in 2004.   Samantha Henderson ALLERGIES INCLUDE THAT TO KEFLEX.   CURRENT MEDICATIONS:  Glucosamine b.i.d., Centrum Silver daily, Toprol  XL, the dose is unknown but may be 12.5 mg daily, Hyzaar daily, aspirin,  Tylenol and Synthroid 0.112 milligrams daily.   FAMILY HISTORY:  Mother died at 37 of a CVA.  Samantha Henderson father died of 20 of  pancreatic cancer.  Samantha Henderson has three sisters and one brother and two whom  have died with cancer and CVA.   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 Samantha Henderson  does have two children.   REVIEW OF SYSTEMS:  There are no specific GI complaints. There have been  no fevers.  Samantha Henderson did notice some chills.   PHYSICAL EXAMINATION:  Samantha Henderson is alert and oriented in no acute distress.  Samantha Henderson is well appearing.  HEENT examination is unremarkable.  Carotid upstrokes are brisk.  Blood pressure is 156/70 and equal in both arms. Pulse 60 and regular.  The lung fields are clear to auscultation and percussion.  There is a 1/6 systolic ejection murmur and I cannot appreciate a  significant diastolic murmur.  EXTREMITIES:  Reveal no edema.  Gross neurologic exam is nonfocal.   Electrocardiogram demonstrates normal sinus rhythm with occasional  premature atrial contractions.  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  This is largely unchanged from previous tracings.   IMPRESSION:  1. Palpitations with rapid heart rate, question underlying atrial      fibrillation  2. History of prior surgery as noted.   RECOMMENDATIONS:  I have discussed the case with Dr. Myrtis Ser.  We will  proceed with an event monitor.  Samantha Henderson will also get a repeat 2-D  echocardiogram as Samantha Henderson had a mild elevation in Samantha Henderson pulmonary artery  pressures.  I will see Samantha Henderson back in follow-up or Dr. Myrtis Ser will see Samantha Henderson  back in follow-up in about 2 weeks. In the interim, we will also obtain  a chest x-ray and try to get any laboratory studies that have been done  from Dr. Zachery Dauer office.  Should Samantha Henderson have a recurrence, then Samantha Henderson is to  contact us.  Samantha Henderson already remains on low-dose aspirin, so we will not  change that at the present time. Should Samantha Henderson have paroxysmal atrial  fibrillation some consideration to further management would be required.     Arturo Morton. Riley Kill, MD, [facility]  Electronically Signed    TDS/MedQ  DD: 04/27/2007  DT: 04/27/2007  Job #: [id number]   cc:   Juluis Rainier, M.D.  Luis Abed, MD, [facility]

## 2010-09-03 NOTE — Assessment & Plan Note (Signed)
[facility]                            CARDIOLOGY OFFICE NOTE   TEXANNA, HILBURN                          MRN:          [id number]  DATE:08/18/2007                            DOB:          [birth year]    Samantha Henderson is seen for cardiology follow-up.  She really is doing well.  I had seen her in January of 2009.  At that time we used some beta  blocker for palpitations.  After that time she was actually admitted on  June 15, 2007, to [facility].  It appears that  she had tongue swelling and it was felt that there was a good chance  that this was related to the Cipro that she had just begun receiving.  At this point, Cipro will be considered an allergy.  She was seen by our  team by Dr. Eden Emms.  She had varying heart rates but it was felt that  her overall cardiac status was stable.  Since that time, she has been  stable.   PAST MEDICAL HISTORY:   ALLERGIES:  KEFLEX and CIPRO (tongue swelling).   MEDICATIONS:  Hyzaar, omeprazole, aspirin, Synthroid, vitamin D,  Lipitor, hydrochlorothiazide, metoprolol, lisinopril and prednisone.   OTHER MEDICAL PROBLEMS:  See the complete list on my note of May 21, 2007.   REVIEW OF SYSTEMS:  She is feeling well at this time and a review of  systems is negative.   PHYSICAL EXAMINATION:  VITAL SIGNS:  Blood pressure today is 132/62.  Pulse is 55.  GENERAL APPEARANCE:  The patient is oriented to person, time and place.  She is here with her husband.  HEENT:  Reveals no xanthelasma.  She has normal extraocular motion.  NECK:  There are no carotid bruits.  There is no jugular venous  distention.  LUNGS:  Clear.  Respiratory effort is not labored.  CARDIAC:  Exam reveals an S1 with an S2.  There are no clicks or  significant murmurs.  ABDOMEN:  Soft.  EXTREMITIES:  She has no peripheral edema.   PROBLEMS:  Are listed on my note of May 21, 2007.  #2.  History of allergies.  TONGUE  SWELLING FROM CIPRO.   The patient also is followed carefully by Dr. Lennox Pippins.  He lists  her problems as polymyalgia rheumatica, osteoarthritis of the hand.  There is a complete note in the chart from him.   Her cardiac status is stable.  I will see her back in one year.     Luis Abed, MD, [facility]  Electronically Signed    JDK/MedQ  DD: 08/18/2007  DT: 08/18/2007  Job #: [id number]   cc:   Juluis Rainier, M.D.  Demetria Pore. Coral Spikes, M.D.

## 2010-09-03 NOTE — Patient Instructions (Signed)
Continue medications as listed Follow up with Dr Myrtis Ser in 1 year

## 2010-09-03 NOTE — Progress Notes (Signed)
HPI Patient is seen today for cardiology followup.  I saw her last July 08, 2010.  She was having constipation from diltiazem.  We decided to switch her to a small dose of amlodipine.  With 2.5 mg of amlodipine her blood pressure is nicely controlled and her constipation is better.  She's not having any significant swelling.  She's not having any significant dizziness.  Her resting heart rate remains approximately 55.  She's doing well. Allergies  Allergen Reactions  . Cephalexin   . Ciprofloxacin   . Lisinopril   . Sulfamethoxazole W/Trimethoprim   . Sulfonamide Derivatives     Current Outpatient Prescriptions  Medication Sig Dispense Refill  . amLODipine (NORVASC) 2.5 MG tablet Take 2.5 mg by mouth daily.        Marland Kitchen aspirin 81 MG tablet Take 81 mg by mouth daily.        Marland Kitchen atorvastatin (LIPITOR) 20 MG tablet Take 20 mg by mouth daily.        . calcium carbonate (TUMS) 500 MG chewable tablet Chew 1 tablet by mouth daily.        . Cholecalciferol (VITAMIN D3) 1000 UNITS CAPS 1 tab po qd       . co-enzyme Q-10 30 MG capsule Take 30 mg by mouth daily.        . cyanocobalamin 500 MCG tablet Take 500 mcg by mouth daily.        . fish oil-omega-3 fatty acids 1000 MG capsule Take 1 g by mouth daily.        . hydrochlorothiazide 25 MG tablet Take 25 mg by mouth daily.        . hydrOXYzine (ATARAX) 25 MG tablet Take 25 mg by mouth 3 (three) times daily as needed.        Marland Kitchen Hyoscyamine Sulfate (HYOMAX-DT) 0.375 MG TBCR 1 tab po qd       . levothyroxine (SYNTHROID, LEVOTHROID) 100 MCG tablet Take 100 mcg by mouth daily.        . Multiple Vitamin (MULTIVITAMIN) capsule Take 1 capsule by mouth daily.        Marland Kitchen diltiazem (TIAZAC) 120 MG 24 hr capsule 1 tablet Daily.        History   Social History  . Marital Status: Married    Spouse Name: N/A    Number of Children: N/A  . Years of Education: N/A   Occupational History  . Not on file.   Social History Main Topics  . Smoking status: Never  Smoker   . Smokeless tobacco: Never Used  . Alcohol Use: Not on file  . Drug Use: Not on file  . Sexually Active: Not on file   Other Topics Concern  . Not on file   Social History Narrative  . No narrative on file    No family history on file.  Past Medical History  Diagnosis Date  . Pericardial effusion     small...posterior...echo..04/2007  . Hypothyroidism   . Temporal arteritis     Rule out temporal arteritis in the past  . PAC (premature atrial contraction)   . Sinus tachycardia   . Palpitation   . HTN (hypertension)   . Polymyalgia   . Leg cramps     from lipitor in the past  . Rheumatic fever     Questionable history of rheumatic fever, but no history of valvular abnormalities  . Aortic regurgitation     .mild...echo...04/2010  . Mitral regurgitation  echo..04/2007  /  mild..04/2010  . Tricuspid regurgitation     -moderate...echo..04/2007...Marland KitchenMarland KitchenEF  60%..echo..04/2007  /   55-60%....echo..05/21/2010  . Ejection fraction 2011    55-60% ejection fraction, echo, January, 2012  . Dizziness   . Pulmonary hypertension   . Diastolic dysfunction     Mild  . Abdominal bruit     Abdominal bruit in the past no aortic aneurysm by evaluation in the past  . Bradycardia   . Constipation     Constipation with diltiazem March, 2012  . Arm weakness     Arm weakness with exercise, January, 2012    Past Surgical History  Procedure Date  . Bunionectomy   . Neck surgery   . Total abdominal hysterectomy   . Back surgery     ROS  Patient denies fever, chills, headache, sweats, rash, change in vision, change in hearing, chest pain, cough, nausea vomiting, urinary symptoms.  All other systems are reviewed and are negative.  PHYSICAL EXAM Patient is oriented to person time and place.  Affect is normal.  There is no xanthelasma.  Lungs are clear.  Respiratory effort is not labored.  Cardiac exam reveals S1-S2.  No clicks or significant murmurs.  The abdomen is soft.  There is no  peripheral edema. Filed Vitals:   09/03/10 1038  BP: 124/78  Pulse: 55  Height: 5' 6.5" (1.689 m)  Weight: 139 lb 4 oz (63.163 kg)    ASSESSMENT & PLAN

## 2010-09-03 NOTE — Assessment & Plan Note (Signed)
[facility]                            CARDIOLOGY OFFICE NOTE   Samantha Henderson, Samantha Henderson                          MRN:          [id number]  DATE:05/21/2007                            DOB:          [birth year]    Samantha Henderson is doing well.  I had seen her in the past.  More recently,  she saw Dr. Riley Kill in consultation and she is here to see me in  followup.  I talked with Dr. Riley Kill later in the day that he saw her.  The patient has had some palpitations.  Two-dimensional echocardiogram  was done and she has vigorous LV function.  There is a small pericardial  effusion.  She has excellent LV function.  There was mild to moderate  tricuspid regurgitation.  There is mild increase in pulmonary pressure.  There is mild to moderate aortic valvular regurgitation.  The patient  also for an event recorder.  She has rare PACs.  She also has some  episodes of increased heart rate with mild sinus tachycardia.  I believe  that her palpitations represent feeling her sinus tachycardia and the  PACs.  She is on a beta-blocker.  I have told her that this rhythm is  not dangerous for her and that she should be reassured.  I encourage her  to take an extra dose of metoprolol if she has significant symptoms.   A chest x-ray was also done.  This study was somewhat over penetrated,  but showed no significant abnormalities.   Today in the office, she is feeling well.  She is not having any  significant complaints.  She is here with her husband.   REVIEW OF SYSTEMS:  Negative.   PHYSICAL EXAMINATION:  VITAL SIGNS:  Weight today is 134 pounds.  Blood  pressure today is 160/83.  Her pulse is 69.  This blood pressure is  mildly increased and it has varied over time in the past.  This can be  followed.  GENERAL:  She is oriented to person, time and place.  Affect is normal.  HEENT:  No xanthelasma.  She has normal extraocular motion.  NECK:  There are no carotid bruits.  There is  no jugular venous  distention.  LUNGS:  Lungs are clear.  Respiratory effort is not labored.  CARDIAC:  S1, S2.  There are no clicks or significant murmurs.  ABDOMEN:  Soft.  She has normal bowel sounds.  There is no peripheral  edema.  No other labs are done today.   Problems are reviewed completely.  I have discussed all the findings  with the patient and her husband.   IMPRESSION:  1. History of palpitations.  With her event recorder and the current      data, I believe that she has intermittent mild sinus tachycardia      with premature atrial contractions.  I would continue to treat her      with a beta-blocker and use reassurance.  She can take extra beta-      blocker if she has any symptoms.  2. History of allergies to codeine and Keflex.  3. In in the remote past, she had leg cramps from Lipitor.  4. History of hypothyroidism on Synthroid.  5. Questionable history of rheumatic fever, although her valvular      studies have never shown significant valvular abnormalities.  6. History of mild pulmonary hypertension and this continues.  7. Soft systolic murmur.  8. Mild to moderate aortic valvular regurgitation and we will follow      this over time.  9. Mild mitral regurgitation.  10.Moderate tricuspid regurgitation.  11.Small pericardial effusion noted on the most recent echocardiogram      that can be followed over time.  12.Vigorous left ventricular function with an ejection fraction in the      70% range.  13.Mild, grade 1 diastolic dysfunction.  14.Areas in the liver seen on echocardiogram that most probably      represent cysts.  This has been noted in the past.  15.In the past, question of an abdominal bruit, but an ultrasound in      the past showed no significant abdominal aortic aneurysm.  16.Variation in blood pressure.  Exact etiology is not clear, but I      still believe this is not a major problem for her.  17.Some type of arthritic complaints.  She is being  evaluated fully by      Dr. Lennox Pippins.  Currently, she is on prednisone 50 mg daily.  From the cardiac workup, I have no further insight concerning her  arthritic discomfort, although as mentioned, there is a small,  pericardial effusion that is present and this can be kept in mind.   Her cardiac status is stable.  I do not know if the question of cyst in  her liver has ever been followed up any further.  I will send this  information to Dr. Zachery Dauer.     Luis Abed, MD, [facility]  Electronically Signed    JDK/MedQ  DD: 05/21/2007  DT: 05/22/2007  Job #: [id number]   cc:   Juluis Rainier, M.D.  Demetria Pore. Coral Spikes, M.D.

## 2010-09-03 NOTE — Assessment & Plan Note (Signed)
Blood pressure is well-controlled on her new medication regimen.  No change in therapy.

## 2010-09-03 NOTE — Consult Note (Signed)
NAMECIPRIANA, BILLER NO.:  [id number]   MEDICAL RECORD NO.:  [id number]          PATIENT TYPE:  OBV   LOCATION:  0104                         FACILITY:  [facility]   PHYSICIAN:  [name], MD, FACCDATE OF BIRTH:  July 17, 1934   DATE OF CONSULTATION:  06/15/2007  DATE OF DISCHARGE:                                 CONSULTATION   [name] a 76 year old patient who came to the ER with multiple  complaints. She has had recent UTI and was started on Cipro. Friday she  appears have had an allergic reaction to it.  She noticed some itchiness  to her skin, difficulty swallowing, and some tongue swelling. Along with  this, she has felt uncomfortable throughout her body including her arms,  back, and chest hurting.  The pain is atypical of angina.  She has no  previous history of coronary artery disease.  She does have a history of  hypertension and possible PVCs and palpitations.   The patient appears to be somewhat dehydrated.  She has not had much of  an appetite.  She has had polymyalgia rheumatica which has really  limited her ability to exercise recently. She has been on steroids for  this.   The pain in her chest is a stabbing. It is sharp.  It can radiate to the  arms and was not pleuritic.  There are no associated current  palpitations.   The patient was noted to have a fever as high as 101 in the emergency  room.   In regards to palpitations, the patient has had occasional flip flops in  the past. She has never had long runs of rapid irregular heartbeat. She  is currently not having palpitations.   REVIEW OF SYSTEMS:  Otherwise negative.   PAST MEDICAL HISTORY:  Remarkable for:  1. Palpitations with PVCs.  2. Hypothyroidism.  3. Polymyalgia rheumatica on steroids.  4. She had an echocardiogram on April 30, 2007, that showed an EF of      70% with no wall motion abnormalities, diastolic dysfunction with      some aortic insufficiency, and mild MR.   ALLERGIES:  The patient is allergic to KEFLEX, CODEINE, LIPITOR and now  CIPRO.   MEDICATIONS PRIOR TO ADMISSION:  1. Synthroid 112 mcg a day.  2. Hycosamine.  3. Metoprolol 50 a day.  4. Omeprazole 20 a day.  5. Aspirin a day.  6. Hydrochlorothiazide 25 a day.  7. Lisinopril 5 a day.  8. Cipro to be stopped.  9. Prednisone 5 b.i.d.   SOCIAL HISTORY:  Patient is married.  Her husband was in the room with  her today. She is retired.  She used to be a Location manager. She does  not smoke or drink.  She does her activities of daily living but not  much else due to polymyalgia rheumatica.   FAMILY HISTORY:  Noncontributory.  Mother died of a CVA at age 36.  Father died at age 60 of pancreatic cancer.   REVIEW OF SYSTEMS:  Is remarkable for some left buttock pain  and some  arthralgias   PHYSICAL EXAMINATION:  '  GENERAL:  Exam is remarkable for a chronically ill-appearing female.  VITAL SIGNS:  Temperature was 101 and is now 100.8.  She is and sinus  rhythm at a rate of 96.  Oxygen saturation is 100% on 2 liters.  Respiratory rate is 14.  HEENT:  Unremarkable.  NECK:  Carotids are normal without bruit.  No lymphadenopathy,  thyromegaly, JVP elevation.  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  LUNGS:  Clear with diaphragmatic motion.  No wheezing.  CARDIAC:  There is an S1 and S2.  I cannot hear a rub.  There is no  murmur.  PMI is normal.  ABDOMEN:  Benign. Bowel sounds positive.  No AAA.  No tenderness.  No  hepatosplenomegaly.  No hepatojugular reflux.  EXTREMITIES:  Distal pulses are intact.  No edema.  NEUROLOGIC:  Nonfocal.  SKIN:  Warm and dry.   I reviewed the patient's CAT scan personally. She has a small  pericardial effusion that is nondescript. She has large hepatic cyst and  some atelectasis.   EKG shows left axis deviation with sinus rhythm, no acute changes.   Lab work is remarkable for white count of 5.6, hematocrit 36. BUN 29,  creatinine 1.09.  D-dimer 3.89.  Point-of-care markers negative.   IMPRESSION:  1. Chest pain, atypical.  Doubt angina, possible pericarditis in      association with steroid suppression and underlying infection.      Check 2-D echocardiogram to reassess size. Check sedimentation      rate. Would treat with Naprosyn or other nonsteroidal q. 6 h.  2. Relative tachycardia with history of PVCs and palpitations.  Check      TSH and T4 to make sure they are normal. Hold hydrochlorothiazide      as the patient appears somewhat dehydrated. Continue metoprolol 50      a day.  3. Fever with recent urinary tract infection.  Cipro to be stopped due      to drug reaction, immunocompromise with chronic prednisone.      Consider broader spectrum antibiotics.  Check UA C&S.  4. Hypothyroidism.  Check TSH, T4.  Continue current dose of      Synthroid.  5. History of reflux. Continue omeprazole 20 mg a day.   Further recommendations will be based on the results of the patient's  sed rate and echocardiogram.      Theron Arista C. Eden Emms, MD, [facility]  Electronically Signed     PCN/MEDQ  D:  06/15/2007  T:  06/15/2007  Job:  [id number]

## 2010-09-03 NOTE — Assessment & Plan Note (Signed)
Heart rate is 55.  No change in therapy.

## 2010-09-06 ENCOUNTER — Ambulatory Visit: Payer: Medicare Other | Admitting: Cardiology

## 2010-09-06 NOTE — Procedures (Signed)
[facility]  Patient:    Samantha Henderson, Samantha Henderson Visit Number: [id number] MRN: [id number]          Service Type: END Location: ENDO Attending Physician:  Orland Mustard Dictated by:   Llana Aliment. Randa Evens, M.D. Proc. Date: 11/08/01 Admit Date:  11/08/2001 Discharge Date: 11/08/2001   CC:         Leonette Most A. Tenny Craw, M.D.   Procedure Report  PROCEDURE:  Colonoscopy.  DATE OF BIRTH:  06/12/1934  MEDICATIONS:  Fentanyl 100 mcg, Versed 8 mg IV.  SCOPE:  Olympus pediatric video colonoscope.  INDICATIONS FOR PROCEDURE:  Strong family history of colon cancer in a brother.  DESCRIPTION OF PROCEDURE:  The procedure had been explained to the patient an consent obtained. With the patient in the left lateral decubitus position, the Olympus pediatric video colonoscope was inserted and advanced under direct visualization. Prep excellent. Using abdominal pressure and position changes, we were able to reach the cecum, ileocecal valve and appendiceal orifice seen. The scope withdrawn and the cecum, ascending colon, hepatic flexure, transverse colon, splenic flexure, descending and sigmoid colon were seen well. No polyps or other lesions seen. The scope was withdrawn back down to the rectum and retroflexed with a finding of small internal hemorrhoids. The scope withdrawn and the patient tolerated the procedure well.  ASSESSMENT: 1. Strong family history of colon cancer but no polyp seen. 2. Small internal hemorrhoids.  PLAN:  Will recommend repeating in five years with yearly Hemoccult. Dictated by:   Llana Aliment. Randa Evens, M.D. Attending Physician:  Orland Mustard DD:  11/08/01 TD:  11/12/01 Job: [id number]

## 2010-10-22 ENCOUNTER — Telehealth: Payer: Self-pay | Admitting: Cardiology

## 2010-10-22 DIAGNOSIS — I10 Essential (primary) hypertension: Secondary | ICD-10-CM

## 2010-10-22 NOTE — Telephone Encounter (Signed)
Pt medication was changed to amlodipine . C/o having cramps day/ night.

## 2010-10-22 NOTE — Telephone Encounter (Signed)
Pt has been having leg cramps for past couple of weeks, she is on hctz she will come for labs on Thur to check potassium

## 2010-10-24 ENCOUNTER — Other Ambulatory Visit: Payer: Medicare Other | Admitting: *Deleted

## 2011-01-10 LAB — CBC
HCT: 36
MCHC: 34.4
MCV: 86
Platelets: 304
RDW: 16.8 — ABNORMAL HIGH

## 2011-01-10 LAB — DIFFERENTIAL
Eosinophils Absolute: 0
Lymphs Abs: 1.2
Monocytes Relative: 1 — ABNORMAL LOW
Neutrophils Relative %: 77

## 2011-01-10 LAB — BASIC METABOLIC PANEL
BUN: 29 — ABNORMAL HIGH
GFR calc non Af Amer: 50 — ABNORMAL LOW
Glucose, Bld: 93
Potassium: 3.5

## 2011-01-10 LAB — CULTURE, BLOOD (ROUTINE X 2): Culture: NO GROWTH

## 2011-01-10 LAB — POCT CARDIAC MARKERS
CKMB, poc: 1.3
Myoglobin, poc: 79.7
Operator id: 4661
Troponin i, poc: <0.05

## 2011-01-10 LAB — URINALYSIS, ROUTINE W REFLEX MICROSCOPIC
Nitrite: NEGATIVE
Specific Gravity, Urine: >1.046 — ABNORMAL HIGH
Urobilinogen, UA: 0.2

## 2011-01-10 LAB — D-DIMER, QUANTITATIVE: D-Dimer, Quant: 3.89 — ABNORMAL HIGH

## 2011-01-10 LAB — URINE MICROSCOPIC-ADD ON

## 2011-01-10 LAB — URINE CULTURE: Culture: NO GROWTH

## 2011-04-02 ENCOUNTER — Encounter (INDEPENDENT_AMBULATORY_CARE_PROVIDER_SITE_OTHER): Payer: Medicare Other | Admitting: Ophthalmology

## 2011-04-02 DIAGNOSIS — H2510 Age-related nuclear cataract, unspecified eye: Secondary | ICD-10-CM

## 2011-04-02 DIAGNOSIS — H43819 Vitreous degeneration, unspecified eye: Secondary | ICD-10-CM

## 2011-04-02 DIAGNOSIS — H01009 Unspecified blepharitis unspecified eye, unspecified eyelid: Secondary | ICD-10-CM

## 2011-04-02 DIAGNOSIS — H33309 Unspecified retinal break, unspecified eye: Secondary | ICD-10-CM

## 2011-04-11 ENCOUNTER — Encounter (INDEPENDENT_AMBULATORY_CARE_PROVIDER_SITE_OTHER): Payer: Medicare Other | Admitting: Ophthalmology

## 2011-04-11 DIAGNOSIS — H33309 Unspecified retinal break, unspecified eye: Secondary | ICD-10-CM

## 2011-08-11 ENCOUNTER — Ambulatory Visit (INDEPENDENT_AMBULATORY_CARE_PROVIDER_SITE_OTHER): Payer: Medicare Other | Admitting: Ophthalmology

## 2011-08-14 ENCOUNTER — Ambulatory Visit (INDEPENDENT_AMBULATORY_CARE_PROVIDER_SITE_OTHER): Payer: Medicare Other | Admitting: Ophthalmology

## 2011-08-18 ENCOUNTER — Ambulatory Visit (INDEPENDENT_AMBULATORY_CARE_PROVIDER_SITE_OTHER): Payer: Medicare Other | Admitting: Ophthalmology

## 2011-08-18 DIAGNOSIS — H33309 Unspecified retinal break, unspecified eye: Secondary | ICD-10-CM

## 2011-08-18 DIAGNOSIS — H43819 Vitreous degeneration, unspecified eye: Secondary | ICD-10-CM

## 2011-08-18 DIAGNOSIS — H2510 Age-related nuclear cataract, unspecified eye: Secondary | ICD-10-CM

## 2011-08-19 ENCOUNTER — Other Ambulatory Visit: Payer: Self-pay | Admitting: Cardiology

## 2011-09-10 ENCOUNTER — Encounter: Payer: Self-pay | Admitting: Cardiology

## 2011-09-10 ENCOUNTER — Ambulatory Visit (INDEPENDENT_AMBULATORY_CARE_PROVIDER_SITE_OTHER): Payer: Medicare Other | Admitting: Cardiology

## 2011-09-10 VITALS — BP 126/60 | HR 63 | Ht 66.0 in | Wt 140.0 lb

## 2011-09-10 DIAGNOSIS — I498 Other specified cardiac arrhythmias: Secondary | ICD-10-CM

## 2011-09-10 DIAGNOSIS — R42 Dizziness and giddiness: Secondary | ICD-10-CM

## 2011-09-10 DIAGNOSIS — I059 Rheumatic mitral valve disease, unspecified: Secondary | ICD-10-CM

## 2011-09-10 DIAGNOSIS — Z8679 Personal history of other diseases of the circulatory system: Secondary | ICD-10-CM

## 2011-09-10 DIAGNOSIS — R001 Bradycardia, unspecified: Secondary | ICD-10-CM

## 2011-09-10 DIAGNOSIS — I340 Nonrheumatic mitral (valve) insufficiency: Secondary | ICD-10-CM

## 2011-09-10 DIAGNOSIS — R002 Palpitations: Secondary | ICD-10-CM

## 2011-09-10 MED ORDER — AMLODIPINE BESYLATE 2.5 MG PO TABS: 2.5000 mg | ORAL_TABLET | Freq: Every day | ORAL | Status: DC

## 2011-09-10 NOTE — Assessment & Plan Note (Signed)
Blood pressure is under good control. No change in therapy 

## 2011-09-10 NOTE — Patient Instructions (Addendum)
Your physician recommends that you schedule a follow-up appointment in:  12 months with Dr. Myrtis Ser MD. The office will mail you a reminder letter 2 months prior the appointment date. Your physician recommends that you continue on your current medications as directed. Please refer to the Current Medication list given to you today.

## 2011-09-10 NOTE — Assessment & Plan Note (Signed)
There is mild valvular heart disease. She does not need a followup echo at this time. I'll see her back in one year.

## 2011-09-10 NOTE — Assessment & Plan Note (Signed)
Patient is not having any significant dizziness. No change in therapy.

## 2011-09-10 NOTE — Progress Notes (Signed)
HPI Patient is seen for cardiology followup. She's doing well. She's not having any significant palpitations. She has a rare sharp chest pain that is very brief in duration. There is no exertional component. It does not sound like angina. She's not having any arm discomfort. She's getting very good blood pressure control.I saw her last in the office May, 2012  Allergies  Allergen Reactions  . Cephalexin   . Ciprofloxacin   . Lisinopril   . Sulfamethoxazole W-Trimethoprim   . Sulfonamide Derivatives     Current Outpatient Prescriptions  Medication Sig Dispense Refill  . amLODipine (NORVASC) 2.5 MG tablet TAKE ONE (1) TABLET(S) DAILY  30 tablet  1  . aspirin 81 MG tablet Take 81 mg by mouth daily.        Marland Kitchen atorvastatin (LIPITOR) 20 MG tablet Take 20 mg by mouth daily.        . calcium carbonate (TUMS) 500 MG chewable tablet Chew 1 tablet by mouth daily.        . Cholecalciferol (VITAMIN D3) 1000 UNITS CAPS 1 tab po qd       . co-enzyme Q-10 30 MG capsule Take 30 mg by mouth daily.        . cyanocobalamin 500 MCG tablet Take 500 mcg by mouth daily.        . fish oil-omega-3 fatty acids 1000 MG capsule Take 1 g by mouth daily.        . Glucosamine-Chondroitin-Vit C 2000-1200-60 MG/30ML LIQD Take by mouth daily.      . hydrochlorothiazide 25 MG tablet Take 25 mg by mouth daily.        . hydrOXYzine (ATARAX) 25 MG tablet Take 25 mg by mouth 3 (three) times daily as needed.        Marland Kitchen Hyoscyamine Sulfate (HYOMAX-DT) 0.375 MG TBCR 25 mg as needed.       Marland Kitchen ketorolac (ACULAR) 0.4 % SOLN Place 1 drop into both eyes Three times a day.      . levothyroxine (SYNTHROID, LEVOTHROID) 100 MCG tablet Take 100 mcg by mouth daily.        . NON FORMULARY I COOL --- daily (for hot flashes)      . traMADol (ULTRAM) 50 MG tablet Take 50 mg by mouth every 6 (six) hours as needed.      . VOLTAREN 1 % GEL Every 6 hours.        History   Social History  . Marital Status: Married    Spouse Name: N/A   Number of Children: N/A  . Years of Education: N/A   Occupational History  . Not on file.   Social History Main Topics  . Smoking status: Never Smoker   . Smokeless tobacco: Never Used  . Alcohol Use: Not on file  . Drug Use: Not on file  . Sexually Active: Not on file   Other Topics Concern  . Not on file   Social History Narrative  . No narrative on file    No family history on file.  Past Medical History  Diagnosis Date  . Pericardial effusion     small...posterior...echo..04/2007  . Hypothyroidism   . Temporal arteritis     Rule out temporal arteritis in the past  . PAC (premature atrial contraction)   . Sinus tachycardia   . Palpitation   . HTN (hypertension)   . Polymyalgia   . Leg cramps     from lipitor in the past  . Rheumatic  fever     Questionable history of rheumatic fever, but no history of valvular abnormalities  . Aortic regurgitation     .mild...echo...04/2010  . Mitral regurgitation     echo..04/2007  /  mild..04/2010  . Tricuspid regurgitation     -moderate...echo..04/2007...Marland KitchenMarland KitchenEF  60%..echo..04/2007  /   55-60%....echo..05/21/2010  . Ejection fraction 2011    55-60% ejection fraction, echo, January, 2012  . Dizziness   . Pulmonary hypertension   . Diastolic dysfunction     Mild  . Abdominal bruit     Abdominal bruit in the past no aortic aneurysm by evaluation in the past  . Bradycardia   . Constipation     Constipation with diltiazem March, 2012  . Arm weakness     Arm weakness with exercise, January, 2012    Past Surgical History  Procedure Date  . Bunionectomy   . Neck surgery   . Total abdominal hysterectomy   . Back surgery     ROS Patient denies fever, chills, headache, sweats, rash, change in vision, change in hearing, chest pain, cough, nausea vomiting, urinary symptoms. All other systems are reviewed and are negative.  PHYSICAL EXAM Patient is oriented to person time and place. Affect is normal. Lungs are clear. Respiratory  effort is non-labored. There is no jugular venous distention. No carotid bruits. Cardiac exam reveals S1 and S2. There no clicks or significant murmurs. Abdomen is soft. There is no peripheral edema.  Filed Vitals:   09/10/11 0904  BP: 126/60  Pulse: 63  Height: 5\' 6"  (1.676 m)  Weight: 140 lb (63.504 kg)   EKG is done today and reviewed by me. There is normal sinus rhythm. There are no diagnostic abnormalities. There is no significant change.  ASSESSMENT & PLAN

## 2011-09-10 NOTE — Assessment & Plan Note (Signed)
She's not having any significant palpitations. No change in therapy. 

## 2012-03-01 ENCOUNTER — Other Ambulatory Visit: Payer: Self-pay | Admitting: Otolaryngology

## 2012-03-01 DIAGNOSIS — H903 Sensorineural hearing loss, bilateral: Secondary | ICD-10-CM

## 2012-03-01 DIAGNOSIS — H905 Unspecified sensorineural hearing loss: Secondary | ICD-10-CM

## 2012-03-08 ENCOUNTER — Ambulatory Visit
Admission: RE | Admit: 2012-03-08 | Discharge: 2012-03-08 | Disposition: A | Discharge status: Home / Self Care | Payer: Medicare Other | Source: Ambulatory Visit | Attending: Otolaryngology | Admitting: Otolaryngology

## 2012-03-08 DIAGNOSIS — H905 Unspecified sensorineural hearing loss: Secondary | ICD-10-CM

## 2012-03-08 DIAGNOSIS — H903 Sensorineural hearing loss, bilateral: Secondary | ICD-10-CM

## 2012-03-08 MED ORDER — GADOBENATE DIMEGLUMINE 529 MG/ML IV SOLN
12.0000 mL | Freq: Once | INTRAVENOUS | Status: AC | PRN
  Administered 2012-03-08: 12 mL via INTRAVENOUS

## 2012-08-18 ENCOUNTER — Ambulatory Visit (INDEPENDENT_AMBULATORY_CARE_PROVIDER_SITE_OTHER): Payer: Medicare Other | Admitting: Ophthalmology

## 2012-08-18 DIAGNOSIS — H2510 Age-related nuclear cataract, unspecified eye: Secondary | ICD-10-CM

## 2012-08-18 DIAGNOSIS — H43819 Vitreous degeneration, unspecified eye: Secondary | ICD-10-CM

## 2012-08-18 DIAGNOSIS — H33309 Unspecified retinal break, unspecified eye: Secondary | ICD-10-CM

## 2012-09-17 ENCOUNTER — Other Ambulatory Visit: Payer: Self-pay

## 2012-09-17 MED ORDER — AMLODIPINE BESYLATE 2.5 MG PO TABS: 2.5000 mg | ORAL_TABLET | Freq: Every day | ORAL | Status: DC

## 2012-09-17 NOTE — Telephone Encounter (Signed)
amLODipine (NORVASC) 2.5 MG tablet  TAKE ONE (1) TABLET(S) DAILY   30 tablet   Your physician recommends that you continue on your current medications as directed. Please refer to the Current Medication list given to you today.

## 2012-10-13 ENCOUNTER — Other Ambulatory Visit: Payer: Self-pay | Admitting: Cardiology

## 2012-11-10 ENCOUNTER — Other Ambulatory Visit: Payer: Self-pay | Admitting: Cardiology

## 2012-12-13 ENCOUNTER — Other Ambulatory Visit: Payer: Self-pay | Admitting: Cardiology

## 2013-02-09 ENCOUNTER — Other Ambulatory Visit: Payer: Self-pay | Admitting: *Deleted

## 2013-02-09 MED ORDER — AMLODIPINE BESYLATE 2.5 MG PO TABS: 2.5000 mg | ORAL_TABLET | Freq: Every day | ORAL | Status: DC

## 2013-02-09 NOTE — Telephone Encounter (Signed)
Appointment made with Dr Myrtis Ser

## 2013-02-28 ENCOUNTER — Ambulatory Visit (INDEPENDENT_AMBULATORY_CARE_PROVIDER_SITE_OTHER): Payer: Medicare Other | Admitting: Cardiology

## 2013-02-28 ENCOUNTER — Encounter: Payer: Self-pay | Admitting: Cardiology

## 2013-02-28 VITALS — BP 120/79 | HR 54 | Ht 66.0 in | Wt 140.0 lb

## 2013-02-28 DIAGNOSIS — I071 Rheumatic tricuspid insufficiency: Secondary | ICD-10-CM

## 2013-02-28 DIAGNOSIS — I10 Essential (primary) hypertension: Secondary | ICD-10-CM

## 2013-02-28 DIAGNOSIS — R002 Palpitations: Secondary | ICD-10-CM

## 2013-02-28 DIAGNOSIS — I079 Rheumatic tricuspid valve disease, unspecified: Secondary | ICD-10-CM

## 2013-02-28 MED ORDER — AMLODIPINE BESYLATE 2.5 MG PO TABS: 2.5000 mg | ORAL_TABLET | Freq: Every day | ORAL | Status: DC

## 2013-02-28 NOTE — Assessment & Plan Note (Signed)
She's not having any significant recurrent palpitations. No further workup.

## 2013-02-28 NOTE — Assessment & Plan Note (Signed)
No further work up 

## 2013-02-28 NOTE — Assessment & Plan Note (Signed)
Blood pressures control. No change in therapy. 

## 2013-02-28 NOTE — Progress Notes (Signed)
HPI  Patient is seen for a one-year followup. She has not been having any significant palpitations. She has mild valvular heart disease by history. I saw her last May, 2013. She had an episode one week ago that sounds like classic vertigo. It improved as the day went on and she has not had any recurring problems.  Allergies  Allergen Reactions  . Cephalexin   . Ciprofloxacin   . Lisinopril   . Sulfamethoxazole-Trimethoprim   . Sulfonamide Derivatives     Current Outpatient Prescriptions  Medication Sig Dispense Refill  . amLODipine (NORVASC) 2.5 MG tablet Take 1 tablet (2.5 mg total) by mouth daily.  30 tablet  0  . aspirin 81 MG tablet Take 81 mg by mouth daily.        Marland Kitchen atorvastatin (LIPITOR) 20 MG tablet Take 20 mg by mouth daily.        . calcium carbonate (TUMS) 500 MG chewable tablet Chew 1 tablet by mouth daily.        . Cholecalciferol (VITAMIN D3) 1000 UNITS CAPS 1 tab po qd       . co-enzyme Q-10 30 MG capsule Take 30 mg by mouth daily.        . cyanocobalamin 500 MCG tablet Take 500 mcg by mouth daily.        . fish oil-omega-3 fatty acids 1000 MG capsule Take 1 g by mouth daily.        . Glucosamine-Chondroitin-Vit C 2000-1200-60 MG/30ML LIQD Take by mouth daily.      . hydrochlorothiazide 25 MG tablet Take 25 mg by mouth daily.        . hydrOXYzine (ATARAX) 25 MG tablet Take 25 mg by mouth 3 (three) times daily as needed.        Marland Kitchen Hyoscyamine Sulfate (HYOMAX-DT) 0.375 MG TBCR 25 mg as needed.       Marland Kitchen levothyroxine (SYNTHROID, LEVOTHROID) 100 MCG tablet Take 100 mcg by mouth daily.        . NON FORMULARY I COOL --- daily (for hot flashes)      . VOLTAREN 1 % GEL Every 6 hours.      Marland Kitchen FLUZONE HIGH-DOSE injection        No current facility-administered medications for this visit.    History   Social History  . Marital Status: Married    Spouse Name: N/A    Number of Children: N/A  . Years of Education: N/A   Occupational History  . Not on file.   Social  History Main Topics  . Smoking status: Never Smoker   . Smokeless tobacco: Never Used  . Alcohol Use: Not on file  . Drug Use: Not on file  . Sexual Activity: Not on file   Other Topics Concern  . Not on file   Social History Narrative  . No narrative on file    No family history on file.  Past Medical History  Diagnosis Date  . Pericardial effusion     small...posterior...echo..04/2007  . Hypothyroidism   . Temporal arteritis     Rule out temporal arteritis in the past  . PAC (premature atrial contraction)   . Sinus tachycardia   . Palpitation   . HTN (hypertension)   . Polymyalgia   . Leg cramps     from lipitor in the past  . Rheumatic fever     Questionable history of rheumatic fever, but no history of valvular abnormalities  . Aortic regurgitation     .  mild...echo...04/2010  . Mitral regurgitation     echo..04/2007  /  mild..04/2010  . Tricuspid regurgitation     -moderate...echo..04/2007...Marland KitchenMarland KitchenEF  60%..echo..04/2007  /   55-60%....echo..05/21/2010  . Ejection fraction 2011    55-60% ejection fraction, echo, January, 2012  . Dizziness   . Pulmonary hypertension   . Diastolic dysfunction     Mild  . Abdominal bruit     Abdominal bruit in the past no aortic aneurysm by evaluation in the past  . Bradycardia   . Constipation     Constipation with diltiazem March, 2012  . Arm weakness     Arm weakness with exercise, January, 2012    Past Surgical History  Procedure Laterality Date  . Bunionectomy    . Neck surgery    . Total abdominal hysterectomy    . Back surgery      Patient Active Problem List   Diagnosis Date Noted  . Pericardial effusion   . Temporal arteritis   . PAC (premature atrial contraction)   . Sinus tachycardia   . Palpitation   . HTN (hypertension)   . Leg cramps   . Rheumatic fever   . Aortic regurgitation   . Mitral regurgitation   . Tricuspid regurgitation   . Ejection fraction   . Dizziness   . Pulmonary hypertension   .  Diastolic dysfunction   . Abdominal bruit   . Bradycardia   . Constipation   . Arm weakness   . DIZZINESS 03/06/2010  . HYPOTHYROIDISM 08/01/2008  . POLYMYALGIA RHEUMATICA 08/01/2008  . PALPITATIONS 08/01/2008  . HYPERTENSION, HX OF 08/01/2008    ROS   Patient denies fever, chills, headache, sweats, rash, change in vision, change in hearing, chest pain, cough, nausea or vomiting, urinary symptoms. All other systems are reviewed and are negative.  PHYSICAL EXAM   Patient is oriented to person time and place. Affect is normal. There is no jugulovenous distention. Lungs are clear. Respiratory effort is nonlabored. Cardiac exam reveals S1 and S2. There no clicks or significant murmurs. The abdomen is soft. There is no peripheral edema.  Filed Vitals:   02/28/13 1433  BP: 120/79  Pulse: 54  Height: 5\' 6"  (1.676 m)  Weight: 140 lb (63.504 kg)   EKG is done today and reviewed by me. There is old low voltage. There is sinus rhythm. There is decreased anterior R wave progression. One PAC is noted.  ASSESSMENT & PLAN

## 2013-02-28 NOTE — Assessment & Plan Note (Signed)
The patient has had some mild valvular abnormalities affecting several valves in the past. This was mild and does not need a followup echo at this time.

## 2013-02-28 NOTE — Patient Instructions (Signed)
**Note De-identified Anastasiya Gowin Obfuscation** Your physician recommends that you continue on your current medications as directed. Please refer to the Current Medication list given to you today.  Your physician wants you to follow-up in: 1 year. You will receive a reminder letter in the mail two months in advance. If you don't receive a letter, please call our office to schedule the follow-up appointment.  

## 2013-08-24 ENCOUNTER — Ambulatory Visit (INDEPENDENT_AMBULATORY_CARE_PROVIDER_SITE_OTHER): Payer: Medicare HMO | Admitting: Ophthalmology

## 2013-08-24 DIAGNOSIS — H33309 Unspecified retinal break, unspecified eye: Secondary | ICD-10-CM

## 2013-08-24 DIAGNOSIS — I10 Essential (primary) hypertension: Secondary | ICD-10-CM

## 2013-08-24 DIAGNOSIS — H35039 Hypertensive retinopathy, unspecified eye: Secondary | ICD-10-CM

## 2013-08-24 DIAGNOSIS — H43819 Vitreous degeneration, unspecified eye: Secondary | ICD-10-CM

## 2013-08-24 DIAGNOSIS — H2510 Age-related nuclear cataract, unspecified eye: Secondary | ICD-10-CM

## 2013-10-06 ENCOUNTER — Telehealth: Payer: Self-pay | Admitting: Cardiology

## 2013-10-06 NOTE — Telephone Encounter (Signed)
The pt states that she has been having palpitations or a fast heart rate on and off for 2 months. She states that "it starts in my throat and moves to my chest and feels like I have been running". She also c/o tightness in her shoulders and neck. The pt is advised that Dr Ron Parker has no open appointments at this time and offered her an appointment with a PA or NP but she declined stating that she only wants to see Dr Ron Parker. I explained to her that I have spoke with Dr Ron Parker concerning her s/s and that he recommends that she see either one of our NP, PA or the DOD as he has nothing available. She agreed to see the DOD but stated that because she has [employer] she has to be referred by her pcp to see a cardiologist. The pt is advised to contact her PCP and ask them for the referral and to call the office to schedule her an appointment. She verbalized understanding.

## 2013-10-06 NOTE — Telephone Encounter (Signed)
New Message  Pt called states that she is experiencing heart palpitations on and off over the span of two months. She is requesting a call back to discuss.

## 2013-10-06 NOTE — Telephone Encounter (Signed)
Follow Up  ° °Pt returned the call  °

## 2013-10-06 NOTE — Telephone Encounter (Signed)
Follow Up:  Pt called to set up an appt w/ Dr. Ron Parker... Pt does not want to wait til Aug for his next appt and pt does not want to see a PA.Marland Kitchen Pt requests a call back from the nurse.. I informed the pt that Dr. Ron Parker is out of the office for about a month

## 2013-10-06 NOTE — Telephone Encounter (Signed)
LMTCB

## 2013-10-10 ENCOUNTER — Telehealth: Payer: Self-pay | Admitting: Cardiology

## 2013-10-10 DIAGNOSIS — R42 Dizziness and giddiness: Secondary | ICD-10-CM

## 2013-10-10 DIAGNOSIS — I491 Atrial premature depolarization: Secondary | ICD-10-CM

## 2013-10-10 NOTE — Telephone Encounter (Signed)
New message    Patient wants to speak with Jeani Hawking when she return tomorrow.

## 2013-10-11 ENCOUNTER — Other Ambulatory Visit: Payer: Self-pay

## 2013-10-11 NOTE — Telephone Encounter (Signed)
**Note De-Identified Samantha Henderson Obfuscation** Per Dr Tamala Julian, DOD, the pt needs to wear a 30 day event monitor for infrequent PAC's.

## 2013-10-11 NOTE — Telephone Encounter (Signed)
Follow up  ° ° °Patient returning call back to nurse  °

## 2013-10-11 NOTE — Telephone Encounter (Signed)
The pt states that she has been having palpitations or a fast heart rate on and off for 2 to 3 months with occasional tightness in her neck and shoulders. She states that she is not dizzy at this time but has had some dizziness within the past 2 months.  The pt is advised, per Dr Tamala Julian (DOD), to wear a event monitor for 30 days due to her palpitations, dizziness and fast HR. She verbalized understanding and agrees to wear the event monitor for 30 days.   I ordered the monitor and sent a message to [facility] to call pt with date and time for event monitor attachment. The pt is aware. Will forward this note to Dr Ron Parker as Juluis Rainier.

## 2013-10-18 ENCOUNTER — Encounter: Payer: Self-pay | Admitting: *Deleted

## 2013-10-18 ENCOUNTER — Encounter (INDEPENDENT_AMBULATORY_CARE_PROVIDER_SITE_OTHER): Payer: Commercial Managed Care - HMO

## 2013-10-18 DIAGNOSIS — R42 Dizziness and giddiness: Secondary | ICD-10-CM

## 2013-10-18 DIAGNOSIS — I491 Atrial premature depolarization: Secondary | ICD-10-CM

## 2013-10-18 NOTE — Progress Notes (Signed)
Patient ID: Samantha Henderson, female   DOB: 30-Apr-1934, 79 y.o.   MRN: [id number] [employer] 30 day cardiac event monitor applied to patient.

## 2013-10-24 ENCOUNTER — Telehealth: Payer: Self-pay | Admitting: *Deleted

## 2013-10-24 NOTE — Telephone Encounter (Signed)
Received monitor results from weekend. Spoke w/husband who states she feels fine.  Had an episode Fri night of heart beating fast but only lasted a few minutes and she was fine.  No dizziness. .Monitor showed AFib.  Spoke w/Dr. Irish Lack who advises that she should start on Coumadin or another blood thinner due to age. Needs to be seen to discuss starting blood thinner. Will leave message for Chanetta Marshall his nurse to see when Dr. Ron Parker could see her.  No one has an available appointment in near future.

## 2013-10-25 NOTE — Telephone Encounter (Signed)
**Note De-identified Wrangler Penning Obfuscation** Please advise 

## 2013-10-28 NOTE — Telephone Encounter (Signed)
Add her to my schedule 11/23/2013

## 2013-11-01 NOTE — Telephone Encounter (Signed)
**Note De-identified Samantha Henderson Obfuscation** LMTCB

## 2013-11-01 NOTE — Telephone Encounter (Signed)
**Note De-Identified Malayah Demuro Obfuscation** The pt and I scheduled an appointment for her to see Dr Ron Parker on 11/23/13 at 9:30.

## 2013-11-23 ENCOUNTER — Ambulatory Visit (INDEPENDENT_AMBULATORY_CARE_PROVIDER_SITE_OTHER): Payer: Commercial Managed Care - HMO | Admitting: Cardiology

## 2013-11-23 ENCOUNTER — Encounter: Payer: Self-pay | Admitting: Cardiology

## 2013-11-23 VITALS — BP 130/60 | HR 56 | Ht 66.5 in | Wt 136.0 lb

## 2013-11-23 DIAGNOSIS — I10 Essential (primary) hypertension: Secondary | ICD-10-CM

## 2013-11-23 DIAGNOSIS — E039 Hypothyroidism, unspecified: Secondary | ICD-10-CM

## 2013-11-23 DIAGNOSIS — I351 Nonrheumatic aortic (valve) insufficiency: Secondary | ICD-10-CM

## 2013-11-23 DIAGNOSIS — I4891 Unspecified atrial fibrillation: Secondary | ICD-10-CM

## 2013-11-23 DIAGNOSIS — I359 Nonrheumatic aortic valve disorder, unspecified: Secondary | ICD-10-CM

## 2013-11-23 DIAGNOSIS — I340 Nonrheumatic mitral (valve) insufficiency: Secondary | ICD-10-CM

## 2013-11-23 DIAGNOSIS — I4819 Other persistent atrial fibrillation: Secondary | ICD-10-CM | POA: Insufficient documentation

## 2013-11-23 DIAGNOSIS — I480 Paroxysmal atrial fibrillation: Secondary | ICD-10-CM

## 2013-11-23 DIAGNOSIS — R001 Bradycardia, unspecified: Secondary | ICD-10-CM

## 2013-11-23 DIAGNOSIS — I498 Other specified cardiac arrhythmias: Secondary | ICD-10-CM

## 2013-11-23 DIAGNOSIS — I059 Rheumatic mitral valve disease, unspecified: Secondary | ICD-10-CM

## 2013-11-23 LAB — CBC WITH DIFFERENTIAL/PLATELET
BASOS PCT: 0.3 % (ref 0.0–3.0)
Basophils Absolute: 0 10*3/uL (ref 0.0–0.1)
EOS PCT: 1.4 % (ref 0.0–5.0)
Eosinophils Absolute: 0.1 10*3/uL (ref 0.0–0.7)
HCT: 41 % (ref 36.0–46.0)
HEMOGLOBIN: 13.8 g/dL (ref 12.0–15.0)
Lymphocytes Relative: 22.5 % (ref 12.0–46.0)
Lymphs Abs: 2.1 10*3/uL (ref 0.7–4.0)
MCHC: 33.5 g/dL (ref 30.0–36.0)
MCV: 88 fl (ref 78.0–100.0)
MONO ABS: 0.4 10*3/uL (ref 0.1–1.0)
Monocytes Relative: 4.7 % (ref 3.0–12.0)
NEUTROS ABS: 6.5 10*3/uL (ref 1.4–7.7)
NEUTROS PCT: 71.1 % (ref 43.0–77.0)
Platelets: 252 10*3/uL (ref 150.0–400.0)
RBC: 4.66 Mil/uL (ref 3.87–5.11)
RDW: 13.3 % (ref 11.5–15.5)
WBC: 9.1 10*3/uL (ref 4.0–10.5)

## 2013-11-23 LAB — BASIC METABOLIC PANEL
BUN: 17 mg/dL (ref 6–23)
CHLORIDE: 97 meq/L (ref 96–112)
CO2: 31 mEq/L (ref 19–32)
CREATININE: 1.1 mg/dL (ref 0.4–1.2)
Calcium: 9.5 mg/dL (ref 8.4–10.5)
GFR: 49.14 mL/min — AB (ref >60.00)
GLUCOSE: 88 mg/dL (ref 70–99)
POTASSIUM: 3.8 meq/L (ref 3.5–5.1)
Sodium: 135 mEq/L (ref 135–145)

## 2013-11-23 LAB — TSH: TSH: 0.96 u[IU]/mL (ref 0.35–4.50)

## 2013-11-23 MED ORDER — DILTIAZEM HCL ER COATED BEADS 120 MG PO CP24: 120.0000 mg | ORAL_CAPSULE | Freq: Every day | ORAL | Status: DC

## 2013-11-23 NOTE — Assessment & Plan Note (Signed)
The patient has had some mild sinus bradycardia. She has not had any significant symptoms.

## 2013-11-23 NOTE — Assessment & Plan Note (Addendum)
This is a new diagnosis. On her event recorder she had several episodes of atrial fibrillation. Some of them included a rapid rate. I had an extended discussion with her about atrial fibrillation. I explained that we will do a followup 2-D echo to reassess. TSH will be checked if we do not find a recent value. She is on a very small dose of amlodipine. I'm going to change this to a very small dose of diltiazem. The patient does meet criteria for requiring anticoagulation. She does not want to take Coumadin. She's concerned about finances but is willing to take 1 of the newer agents. We will start Eliquis. He'll also need to be sure that her renal function is stable before starting the medication.  As part of today's evaluation I spent greater than 25 minutes with a total care. More than half of this time is been with direct contact with a long and complete description of her atrial fibrillation and the indications for anticoagulation.

## 2013-11-23 NOTE — Assessment & Plan Note (Signed)
The patient is on thyroid replacement. I do not have her recent TSH available to me. I will check to see if one has been done elsewhere. If not, TSH will be done.

## 2013-11-23 NOTE — Assessment & Plan Note (Signed)
By history there is aortic regurgitation. It is time for followup echo to reassess this valvular abnormality. 2-D echo will be done.

## 2013-11-23 NOTE — Assessment & Plan Note (Signed)
Blood pressure is controlled. No change in therapy. 

## 2013-11-23 NOTE — Assessment & Plan Note (Signed)
There is mild mitral regurgitation by history. Followup echo will be done.

## 2013-11-23 NOTE — Progress Notes (Signed)
Patient ID: Samantha Henderson, female   DOB: 1934-10-04, 79 y.o.   MRN: [id number]    HPI  Patient is seen today to followup history of palpitations and mild valvular heart disease. I saw her last in November, 2014. She's been having palpitations and eventually plans were made for her to wear an event recorder. This was done. She had several episodes of documented atrial fibrillation. There was some increased heart rate with this. She is now here for further discussion concerning all of this. She's not having any significant chest pain. She has not had a recent TSH.  Allergies  Allergen Reactions  . Cephalexin   . Ciprofloxacin   . Lisinopril   . Sulfamethoxazole-Trimethoprim   . Sulfonamide Derivatives     Current Outpatient Prescriptions  Medication Sig Dispense Refill  . amLODipine (NORVASC) 2.5 MG tablet Take 1 tablet (2.5 mg total) by mouth daily.  90 tablet  3  . aspirin 81 MG tablet Take 81 mg by mouth daily.        Marland Kitchen atorvastatin (LIPITOR) 20 MG tablet Take 20 mg by mouth daily.        . calcium carbonate (TUMS) 500 MG chewable tablet Chew 1 tablet by mouth daily.        Marland Kitchen co-enzyme Q-10 30 MG capsule Take 30 mg by mouth daily.        . cyanocobalamin 500 MCG tablet Take 500 mcg by mouth daily.        . fish oil-omega-3 fatty acids 1000 MG capsule Take 1 g by mouth daily.        . Glucosamine-Chondroitin-Vit C 2000-1200-60 MG/30ML LIQD Take by mouth daily.      . hydrochlorothiazide 25 MG tablet Take 25 mg by mouth daily.        . hydrOXYzine (ATARAX) 25 MG tablet Take 25 mg by mouth 3 (three) times daily as needed.        Marland Kitchen levothyroxine (SYNTHROID, LEVOTHROID) 112 MCG tablet Take 112 mcg by mouth daily before breakfast.      . NON FORMULARY I COOL --- daily (for hot flashes)      . VOLTAREN 1 % GEL Every 6 hours.      . Cholecalciferol (VITAMIN D3) 1000 UNITS CAPS 1 tab po qd       . FLUZONE HIGH-DOSE injection        No current facility-administered medications for this visit.     History   Social History  . Marital Status: Married    Spouse Name: N/A    Number of Children: N/A  . Years of Education: N/A   Occupational History  . Not on file.   Social History Main Topics  . Smoking status: Never Smoker   . Smokeless tobacco: Never Used  . Alcohol Use: Not on file  . Drug Use: Not on file  . Sexual Activity: Not on file   Other Topics Concern  . Not on file   Social History Narrative  . No narrative on file    No family history on file.  Past Medical History  Diagnosis Date  . Pericardial effusion     small...posterior...echo..04/2007  . Hypothyroidism   . Temporal arteritis     Rule out temporal arteritis in the past  . PAC (premature atrial contraction)   . Sinus tachycardia   . Palpitation   . HTN (hypertension)   . Polymyalgia   . Leg cramps     from lipitor in the  past  . Rheumatic fever     Questionable history of rheumatic fever, but no history of valvular abnormalities  . Aortic regurgitation     .mild...echo...04/2010  . Mitral regurgitation     echo..04/2007  /  mild..04/2010  . Tricuspid regurgitation     -moderate...echo..04/2007...Marland KitchenMarland KitchenEF  60%..echo..04/2007  /   55-60%....echo..05/21/2010  . Ejection fraction 2011    55-60% ejection fraction, echo, January, 2012  . Dizziness   . Pulmonary hypertension   . Diastolic dysfunction     Mild  . Abdominal bruit     Abdominal bruit in the past no aortic aneurysm by evaluation in the past  . Bradycardia   . Constipation     Constipation with diltiazem March, 2012  . Arm weakness     Arm weakness with exercise, January, 2012    Past Surgical History  Procedure Laterality Date  . Bunionectomy    . Neck surgery    . Total abdominal hysterectomy    . Back surgery      Patient Active Problem List   Diagnosis Date Noted  . Paroxysmal atrial fibrillation 11/23/2013  . Pericardial effusion   . Temporal arteritis   . PAC (premature atrial contraction)   . Sinus tachycardia    . Palpitation   . HTN (hypertension)   . Leg cramps   . Rheumatic fever   . Aortic regurgitation   . Mitral regurgitation   . Tricuspid regurgitation   . Ejection fraction   . Dizziness   . Pulmonary hypertension   . Diastolic dysfunction   . Abdominal bruit   . Bradycardia   . Constipation   . Arm weakness   . DIZZINESS 03/06/2010  . HYPOTHYROIDISM 08/01/2008  . POLYMYALGIA RHEUMATICA 08/01/2008    ROS   Patient denies fever, chills, headache, sweats, rash, change in vision, change in hearing, chest pain, cough, nausea or vomiting, urinary symptoms. All other systems are reviewed and are negative.  PHYSICAL EXAM  Patient is oriented to person time and place. Affect is normal. Head is atraumatic. Sclera and conjunctiva are normal. There is no jugulovenous distention. Lungs are clear. Respiratory effort is nonlabored. Cardiac exam reveals an S1 and S2. The rhythm is regular today. Abdomen is soft. There is no peripheral edema. There no musculoskeletal deformities. There are no skin rashes.  Filed Vitals:   11/23/13 0845  BP: 130/60  Pulse: 56  Height: 5' 6.5" (1.689 m)  Weight: 136 lb (61.689 kg)    I have reviewed the patient's event recorder. There are episodes of atrial fibrillation with rapid rates.  ASSESSMENT & PLAN

## 2013-11-23 NOTE — Patient Instructions (Addendum)
Your physician has recommended you make the following change in your medication: stop taking Amlodipine and start taking Diltiazem 120 mg daily and Eliquis 5 mg twice daily (please do not start Eliquis until you hear from [place] concerning your lab results).  Your physician has requested that you have an echocardiogram. Echocardiography is a painless test that uses sound waves to create images of your heart. It provides your doctor with information about the size and shape of your heart and how well your heart's chambers and valves are working. This procedure takes approximately one hour. There are no restrictions for this procedure.  Your physician recommends that you return for lab work in: today  Your physician recommends that you schedule a follow-up appointment in: 3 to 4 weeks with DR Ron Parker and in 1 month with [facility].

## 2013-11-25 ENCOUNTER — Encounter: Payer: Self-pay | Admitting: Cardiology

## 2013-12-05 ENCOUNTER — Ambulatory Visit (HOSPITAL_COMMUNITY): Payer: Medicare PPO | Attending: Cardiology | Admitting: Radiology

## 2013-12-05 DIAGNOSIS — I2789 Other specified pulmonary heart diseases: Secondary | ICD-10-CM | POA: Insufficient documentation

## 2013-12-05 DIAGNOSIS — I4891 Unspecified atrial fibrillation: Secondary | ICD-10-CM | POA: Diagnosis not present

## 2013-12-05 DIAGNOSIS — K7689 Other specified diseases of liver: Secondary | ICD-10-CM | POA: Diagnosis not present

## 2013-12-05 DIAGNOSIS — I480 Paroxysmal atrial fibrillation: Secondary | ICD-10-CM

## 2013-12-05 DIAGNOSIS — I340 Nonrheumatic mitral (valve) insufficiency: Secondary | ICD-10-CM

## 2013-12-05 DIAGNOSIS — I351 Nonrheumatic aortic (valve) insufficiency: Secondary | ICD-10-CM

## 2013-12-05 NOTE — Progress Notes (Signed)
Echocardiogram performed.  

## 2013-12-06 ENCOUNTER — Encounter: Payer: Self-pay | Admitting: Cardiology

## 2013-12-06 DIAGNOSIS — K7689 Other specified diseases of liver: Secondary | ICD-10-CM | POA: Insufficient documentation

## 2013-12-19 ENCOUNTER — Encounter: Payer: Self-pay | Admitting: Cardiology

## 2013-12-19 ENCOUNTER — Ambulatory Visit (INDEPENDENT_AMBULATORY_CARE_PROVIDER_SITE_OTHER): Payer: Commercial Managed Care - HMO | Admitting: Cardiology

## 2013-12-19 VITALS — Ht 66.5 in | Wt 136.0 lb

## 2013-12-19 DIAGNOSIS — I10 Essential (primary) hypertension: Secondary | ICD-10-CM

## 2013-12-19 DIAGNOSIS — I340 Nonrheumatic mitral (valve) insufficiency: Secondary | ICD-10-CM

## 2013-12-19 DIAGNOSIS — E039 Hypothyroidism, unspecified: Secondary | ICD-10-CM

## 2013-12-19 DIAGNOSIS — K7689 Other specified diseases of liver: Secondary | ICD-10-CM

## 2013-12-19 DIAGNOSIS — I480 Paroxysmal atrial fibrillation: Secondary | ICD-10-CM

## 2013-12-19 DIAGNOSIS — I059 Rheumatic mitral valve disease, unspecified: Secondary | ICD-10-CM

## 2013-12-19 DIAGNOSIS — I4891 Unspecified atrial fibrillation: Secondary | ICD-10-CM

## 2013-12-19 DIAGNOSIS — I359 Nonrheumatic aortic valve disorder, unspecified: Secondary | ICD-10-CM

## 2013-12-19 DIAGNOSIS — I351 Nonrheumatic aortic (valve) insufficiency: Secondary | ICD-10-CM

## 2013-12-19 MED ORDER — APIXABAN 5 MG PO TABS: 5.0000 mg | ORAL_TABLET | Freq: Two times a day (BID) | ORAL | Status: DC

## 2013-12-19 MED ORDER — DILTIAZEM HCL ER COATED BEADS 120 MG PO CP24: 120.0000 mg | ORAL_CAPSULE | Freq: Every day | ORAL | Status: DC

## 2013-12-19 NOTE — Assessment & Plan Note (Signed)
Her rhythm is regular today. She is taking Eliquis. Therefore she no longer needs aspirin. This can be stopped.

## 2013-12-19 NOTE — Assessment & Plan Note (Signed)
Blood pressure is controlled. No change in therapy. 

## 2013-12-19 NOTE — Assessment & Plan Note (Signed)
Mitral regurgitation is read as possibly more significant than prior echoes. This is not a significant problem at this time. Actos can be followed over time.

## 2013-12-19 NOTE — Assessment & Plan Note (Signed)
Aortic regurgitation is stable. No change in therapy.

## 2013-12-19 NOTE — Progress Notes (Signed)
Patient ID: Samantha Henderson, female   DOB: 02/05/1935, 79 y.o.   MRN: [id number]    HPI  Patient is seen today in followup paroxysmal atrial fibrillation. When I saw her last on November 23, 2013, I adjusted her medicines switching her to a small dose of diltiazem. Also after a long discussion, she agreed to take Eliquis. She is tolerating both of these medicines. Her blood test on that day were stable. Her thyroid test was normal. She also had a followup two-dimensional echo. This showed the left ventricular function remains good with an EF of 60-65%. She is mild aortic regurgitation that we have seen in the past. Her mitral regurgitation is moderate. This can be followed over time.  Allergies  Allergen Reactions  . Cephalexin   . Ciprofloxacin   . Lisinopril   . Sulfamethoxazole-Trimethoprim   . Sulfonamide Derivatives     Current Outpatient Prescriptions  Medication Sig Dispense Refill  . apixaban (ELIQUIS) 5 MG TABS tablet Take 5 mg by mouth 2 (two) times daily.      Marland Kitchen aspirin 81 MG tablet Take 81 mg by mouth daily.        Marland Kitchen atorvastatin (LIPITOR) 20 MG tablet Take 20 mg by mouth daily.        . calcium carbonate (TUMS) 500 MG chewable tablet Chew 1 tablet by mouth daily.        . Cholecalciferol (VITAMIN D3) 1000 UNITS CAPS 1 tab po qd       . co-enzyme Q-10 30 MG capsule Take 30 mg by mouth daily.        . cyanocobalamin 500 MCG tablet Take 500 mcg by mouth daily.        Marland Kitchen diltiazem (DILTIAZEM CD) 120 MG 24 hr capsule Take 1 capsule (120 mg total) by mouth daily.  30 capsule  3  . fish oil-omega-3 fatty acids 1000 MG capsule Take 1 g by mouth daily.        Marland Kitchen FLUZONE HIGH-DOSE injection       . Glucosamine-Chondroitin-Vit C 2000-1200-60 MG/30ML LIQD Take by mouth daily.      . hydrochlorothiazide 25 MG tablet Take 25 mg by mouth daily.        . hydrOXYzine (ATARAX) 25 MG tablet Take 25 mg by mouth 3 (three) times daily as needed.        Marland Kitchen levothyroxine (SYNTHROID, LEVOTHROID) 112 MCG  tablet Take 112 mcg by mouth daily before breakfast.      . NON FORMULARY I COOL --- daily (for hot flashes)      . VOLTAREN 1 % GEL Every 6 hours.       No current facility-administered medications for this visit.    History   Social History  . Marital Status: Married    Spouse Name: N/A    Number of Children: N/A  . Years of Education: N/A   Occupational History  . Not on file.   Social History Main Topics  . Smoking status: Never Smoker   . Smokeless tobacco: Never Used  . Alcohol Use: Not on file  . Drug Use: Not on file  . Sexual Activity: Not on file   Other Topics Concern  . Not on file   Social History Narrative  . No narrative on file    No family history on file.  Past Medical History  Diagnosis Date  . Pericardial effusion     small...posterior...echo..04/2007  . Hypothyroidism   . Temporal arteritis  Rule out temporal arteritis in the past  . PAC (premature atrial contraction)   . Sinus tachycardia   . Palpitation   . HTN (hypertension)   . Polymyalgia   . Leg cramps     from lipitor in the past  . Rheumatic fever     Questionable history of rheumatic fever, but no history of valvular abnormalities  . Aortic regurgitation     .mild...echo...04/2010  . Mitral regurgitation     echo..04/2007  /  mild..04/2010  . Tricuspid regurgitation     -moderate...echo..04/2007...Marland KitchenMarland KitchenEF  60%..echo..04/2007  /   55-60%....echo..05/21/2010  . Ejection fraction 2011    55-60% ejection fraction, echo, January, 2012  . Dizziness   . Pulmonary hypertension   . Diastolic dysfunction     Mild  . Abdominal bruit     Abdominal bruit in the past no aortic aneurysm by evaluation in the past  . Bradycardia   . Constipation     Constipation with diltiazem March, 2012  . Arm weakness     Arm weakness with exercise, January, 2012    Past Surgical History  Procedure Laterality Date  . Bunionectomy    . Neck surgery    . Total abdominal hysterectomy    . Back surgery       Patient Active Problem List   Diagnosis Date Noted  . Hepatic cyst 12/06/2013  . Paroxysmal atrial fibrillation 11/23/2013  . Pericardial effusion   . Temporal arteritis   . PAC (premature atrial contraction)   . Sinus tachycardia   . Palpitation   . HTN (hypertension)   . Leg cramps   . Rheumatic fever   . Aortic regurgitation   . Mitral regurgitation   . Tricuspid regurgitation   . Ejection fraction   . Dizziness   . Pulmonary hypertension   . Diastolic dysfunction   . Abdominal bruit   . Bradycardia   . Constipation   . Arm weakness   . HYPOTHYROIDISM 08/01/2008  . POLYMYALGIA RHEUMATICA 08/01/2008    ROS   Patient denies fever, chills, headache, sweats, rash, change in vision, change in hearing, chest pain, cough, nausea vomiting, urinary symptoms. All of the systems are reviewed and are negative.  PHYSICAL EXAM   Patient is oriented to person time and place. Affect is normal. There is no jugulovenous distention. Head is atraumatic. Sclera and conjunctiva are normal. Lungs are clear. Respiratory effort is unlabored. Cardiac exam reveals S1 and S2. The abdomen is soft. There is no peripheral edema.  Filed Vitals:   12/19/13 0918  Height: 5' 6.5" (1.689 m)  Weight: 136 lb (61.689 kg)     ASSESSMENT & PLAN

## 2013-12-19 NOTE — Assessment & Plan Note (Signed)
The patient had a 2-D echo on December 05, 2013 an incidental finding with that was several hepatic cysts. The largest was 6 x 6 cm. I will pass this information to her primary care physician.

## 2013-12-19 NOTE — Patient Instructions (Signed)
Your physician has recommended you make the following change in your medication: stop taking Aspirin.  Your physician recommends that you schedule a follow-up appointment in: 3 months.  Please contact your primary care doctor concerning cyst in liver that was seen on 2 D Echo.

## 2013-12-19 NOTE — Assessment & Plan Note (Signed)
A TSH was checked recently and was in the normal range.

## 2013-12-28 ENCOUNTER — Ambulatory Visit (INDEPENDENT_AMBULATORY_CARE_PROVIDER_SITE_OTHER): Payer: Commercial Managed Care - HMO | Admitting: Pharmacist

## 2013-12-28 DIAGNOSIS — I4891 Unspecified atrial fibrillation: Secondary | ICD-10-CM

## 2013-12-28 LAB — CBC
HCT: 40.2 % (ref 36.0–46.0)
Hemoglobin: 13.5 g/dL (ref 12.0–15.0)
MCHC: 33.6 g/dL (ref 30.0–36.0)
MCV: 88.8 fl (ref 78.0–100.0)
Platelets: 223 10*3/uL (ref 150.0–400.0)
RBC: 4.52 Mil/uL (ref 3.87–5.11)
RDW: 13.4 % (ref 11.5–15.5)
WBC: 7.9 10*3/uL (ref 4.0–10.5)

## 2013-12-28 NOTE — Progress Notes (Signed)
Pt was started on Eliquis for Atrial fibrillation by Dr. Ron Parker on 11/23/13  Reviewed patients medication list.  Pt is not currently on any combined P-gp and strong CYP3A4 inhibitors/inducers (ketoconazole, traconazole, ritonavir, carbamazepine, phenytoin, rifampin, St. John's wort).  Reviewed labs.  SCr 1.1, Weight 62 kg, CrCl- 43 mL/min.  Dose appropriate based on CrCl.   Hgb and HCT Within Normal Limits  A full discussion of the nature of anticoagulants has been carried out.  A benefit/risk analysis has been presented to the patient, so that they understand the justification for choosing anticoagulation with Eliquis at this time.  The need for compliance is stressed.  Pt is aware to take the medication twice daily.  Side effects of potential bleeding are discussed, including unusual colored urine or stools, coughing up blood or coffee ground emesis, nose bleeds or serious fall or head trauma.  Discussed signs and symptoms of stroke. The patient should avoid any OTC items containing aspirin or ibuprofen.  Avoid alcohol consumption.   Call if any signs of abnormal bleeding.  Discussed financial obligations and resolved any difficulty in obtaining medication.  Next lab test test in 6 months.

## 2013-12-30 ENCOUNTER — Other Ambulatory Visit: Payer: Self-pay | Admitting: Family Medicine

## 2013-12-30 DIAGNOSIS — K7689 Other specified diseases of liver: Secondary | ICD-10-CM

## 2014-01-06 ENCOUNTER — Ambulatory Visit
Admission: RE | Admit: 2014-01-06 | Discharge: 2014-01-06 | Disposition: A | Discharge status: Home / Self Care | Payer: Commercial Managed Care - HMO | Source: Ambulatory Visit | Attending: Family Medicine | Admitting: Family Medicine

## 2014-01-06 DIAGNOSIS — K7689 Other specified diseases of liver: Secondary | ICD-10-CM

## 2014-03-20 ENCOUNTER — Ambulatory Visit (INDEPENDENT_AMBULATORY_CARE_PROVIDER_SITE_OTHER): Payer: Commercial Managed Care - HMO | Admitting: Cardiology

## 2014-03-20 ENCOUNTER — Encounter: Payer: Self-pay | Admitting: Cardiology

## 2014-03-20 VITALS — BP 134/52 | HR 77 | Ht 66.5 in | Wt 136.0 lb

## 2014-03-20 DIAGNOSIS — M2550 Pain in unspecified joint: Secondary | ICD-10-CM

## 2014-03-20 DIAGNOSIS — I480 Paroxysmal atrial fibrillation: Secondary | ICD-10-CM

## 2014-03-20 DIAGNOSIS — K7689 Other specified diseases of liver: Secondary | ICD-10-CM

## 2014-03-20 MED ORDER — APIXABAN 5 MG PO TABS: 5.0000 mg | ORAL_TABLET | Freq: Two times a day (BID) | ORAL | Status: DC

## 2014-03-20 NOTE — Patient Instructions (Signed)
**Note De-identified Samantha Henderson Obfuscation** Your physician recommends that you continue on your current medications as directed. Please refer to the Current Medication list given to you today.  Your physician wants you to follow-up in: 6 months. You will receive a reminder letter in the mail two months in advance. If you don't receive a letter, please call our office to schedule the follow-up appointment.  

## 2014-03-20 NOTE — Progress Notes (Signed)
Patient ID: Samantha Henderson, female   DOB: [birth year], 79 y.o.   MRN: [id number]    HPI Patient is seen to follow-up paroxysmal atrial fibrillation. I saw her last August, 2015. There is been question of hepatic cysts seen on her 2-D echo. I was in contact with her primary physician who ordered an abdominal ultrasound. This was done in September, 2015. It was felt that these were simple cysts.   The patient has had some palpitations but no prolonged episodes. She is taking the anticoagulation is recommended. She is having diffuse joint pains and wants to know if it could be related to her anticoagulation. I told her I thought this was very unlikely. She is on atorvastatin, however she has been on this for a long time.  Allergies  Allergen Reactions  . Cephalexin   . Ciprofloxacin   . Lisinopril   . Sulfamethoxazole-Trimethoprim   . Sulfonamide Derivatives     Current Outpatient Prescriptions  Medication Sig Dispense Refill  . acetaminophen (TYLENOL) 650 MG CR tablet Take 650 mg by mouth every 8 (eight) hours as needed for pain.    Marland Kitchen apixaban (ELIQUIS) 5 MG TABS tablet Take 1 tablet (5 mg total) by mouth 2 (two) times daily. 60 tablet 3  . atorvastatin (LIPITOR) 20 MG tablet Take 20 mg by mouth daily.      . calcium carbonate (TUMS) 500 MG chewable tablet Chew 1 tablet by mouth daily.      . Cholecalciferol (VITAMIN D3) 1000 UNITS CAPS 1 tab po qd     . diltiazem (DILTIAZEM CD) 120 MG 24 hr capsule Take 1 capsule (120 mg total) by mouth daily. 60 capsule 3  . fish oil-omega-3 fatty acids 1000 MG capsule Take 1 g by mouth daily.      Marland Kitchen FLUZONE HIGH-DOSE injection     . Glucosamine-Chondroitin-Vit C 2000-1200-60 MG/30ML LIQD Take by mouth daily.    . hydrochlorothiazide 25 MG tablet Take 25 mg by mouth daily.      Marland Kitchen levothyroxine (SYNTHROID, LEVOTHROID) 112 MCG tablet Take 112 mcg by mouth daily before breakfast.     No current facility-administered medications for this visit.    History    Social History  . Marital Status: Married    Spouse Name: N/A    Number of Children: N/A  . Years of Education: N/A   Occupational History  . Not on file.   Social History Main Topics  . Smoking status: Never Smoker   . Smokeless tobacco: Never Used  . Alcohol Use: Not on file  . Drug Use: Not on file  . Sexual Activity: Not on file   Other Topics Concern  . Not on file   Social History Narrative    History reviewed. No pertinent family history.  Past Medical History  Diagnosis Date  . Pericardial effusion     small...posterior...echo..04/2007  . Hypothyroidism   . Temporal arteritis     Rule out temporal arteritis in the past  . PAC (premature atrial contraction)   . Sinus tachycardia   . Palpitation   . HTN (hypertension)   . Polymyalgia   . Leg cramps     from lipitor in the past  . Rheumatic fever     Questionable history of rheumatic fever, but no history of valvular abnormalities  . Aortic regurgitation     .mild...echo...04/2010  . Mitral regurgitation     echo..04/2007  /  mild..04/2010  . Tricuspid regurgitation     -  moderate...echo..04/2007...Marland KitchenMarland KitchenEF  60%..echo..04/2007  /   55-60%....echo..05/21/2010  . Ejection fraction 2011    55-60% ejection fraction, echo, January, 2012  . Dizziness   . Pulmonary hypertension   . Diastolic dysfunction     Mild  . Abdominal bruit     Abdominal bruit in the past no aortic aneurysm by evaluation in the past  . Bradycardia   . Constipation     Constipation with diltiazem March, 2012  . Arm weakness     Arm weakness with exercise, January, 2012    Past Surgical History  Procedure Laterality Date  . Bunionectomy    . Neck surgery    . Total abdominal hysterectomy    . Back surgery      Patient Active Problem List   Diagnosis Date Noted  . Hepatic cyst 12/06/2013  . Paroxysmal atrial fibrillation 11/23/2013  . Pericardial effusion   . Temporal arteritis   . PAC (premature atrial contraction)   . Sinus  tachycardia   . Palpitation   . HTN (hypertension)   . Leg cramps   . Rheumatic fever   . Aortic regurgitation   . Mitral regurgitation   . Tricuspid regurgitation   . Ejection fraction   . Dizziness   . Pulmonary hypertension   . Diastolic dysfunction   . Abdominal bruit   . Bradycardia   . Constipation   . Arm weakness   . HYPOTHYROIDISM 08/01/2008  . POLYMYALGIA RHEUMATICA 08/01/2008    ROS  Patient denies fever, chills, headache, sweats, rash, change in vision, change in hearing, chest pain, cough, nausea or vomiting, urinary symptoms. All other systems are reviewed and are negative.  PHYSICAL EXAM Patient is stable today. She is oriented to person time and place. Affect is normal. Head is atraumatic. Sclera and conjunctiva are normal. There is no jugular venous distention. Lungs are clear. Respiratory effort is not labored. Cardiac exam reveals S1 and S2. Abdomen is soft. There is no peripheral edema. There are no musculoskeletal deformities. There are no skin rashes.  Filed Vitals:   03/20/14 0924  BP: 134/52  Pulse: 77  Height: 5' 6.5" (1.689 m)  Weight: 136 lb (61.689 kg)   EKG is done and reviewed by me today. There is sinus rhythm. There is no change from the past.  ASSESSMENT & PLAN

## 2014-03-20 NOTE — Assessment & Plan Note (Signed)
The patient has some mild recurrent palpitations. She is not having significant symptoms from this. She is anticoagulated. I discussed with her again the importance of continuing the anticoagulation.

## 2014-03-20 NOTE — Assessment & Plan Note (Signed)
I explained to the patient that I thought it was very unlikely that her joint pain is related to Eliquis. She has been on atorvastatin for a long time. However it is possible she could have some symptoms from this. I suggested that she try coming off her atorvastatin for one to 2 weeks to see if this helps. However I think it's most likely that her joint pain is related to other rheumatologic problems. I've encouraged her to see her primary physician and her rheumatologist.

## 2014-05-24 ENCOUNTER — Telehealth: Payer: Self-pay | Admitting: Cardiology

## 2014-05-24 NOTE — Telephone Encounter (Signed)
**Note De-Identified Shay Jhaveri Obfuscation** The pts husband states that he has spoken with Alfred Levins, RN at this office concerning the pts primary pharmacy. He wants to be sure that the pts Rx's are sent to [facility] unless they request otherwise. He is advised that I am forwarding this message to our refill team to correct in the pts chart and to make them aware of the pts request. The pt verbalized understanding.

## 2014-05-24 NOTE — Telephone Encounter (Signed)
New Message        Pt's husband calling stating that he spoke with [facility] a few weeks ago in regards to getting pt's rx sent through [facility] and has questions in regards to this. Please call back and advise.

## 2014-05-29 ENCOUNTER — Telehealth: Payer: Self-pay

## 2014-05-29 MED ORDER — DILTIAZEM HCL ER COATED BEADS 120 MG PO CP24: 120.0000 mg | ORAL_CAPSULE | Freq: Every day | ORAL | Status: DC

## 2014-05-29 NOTE — Telephone Encounter (Signed)
Per Tar[place]tha, with [facility], the pt's Eliquis (prior auth) has been approved until 05/30/15. Approval #= [id number]  The pt is aware that she has been approved.

## 2014-06-16 ENCOUNTER — Other Ambulatory Visit: Payer: Self-pay | Admitting: *Deleted

## 2014-06-16 MED ORDER — APIXABAN 5 MG PO TABS: 5.0000 mg | ORAL_TABLET | Freq: Two times a day (BID) | ORAL | Status: DC

## 2014-08-29 ENCOUNTER — Ambulatory Visit (INDEPENDENT_AMBULATORY_CARE_PROVIDER_SITE_OTHER): Payer: Medicare HMO | Admitting: Ophthalmology

## 2014-09-13 ENCOUNTER — Ambulatory Visit (INDEPENDENT_AMBULATORY_CARE_PROVIDER_SITE_OTHER): Payer: Medicare Other | Admitting: Ophthalmology

## 2014-09-13 DIAGNOSIS — H25011 Cortical age-related cataract, right eye: Secondary | ICD-10-CM | POA: Diagnosis not present

## 2014-09-13 DIAGNOSIS — I10 Essential (primary) hypertension: Secondary | ICD-10-CM

## 2014-09-13 DIAGNOSIS — M0609 Rheumatoid arthritis without rheumatoid factor, multiple sites: Secondary | ICD-10-CM | POA: Diagnosis not present

## 2014-09-13 DIAGNOSIS — H33302 Unspecified retinal break, left eye: Secondary | ICD-10-CM | POA: Diagnosis not present

## 2014-09-13 DIAGNOSIS — H35033 Hypertensive retinopathy, bilateral: Secondary | ICD-10-CM | POA: Diagnosis not present

## 2014-09-20 ENCOUNTER — Ambulatory Visit (INDEPENDENT_AMBULATORY_CARE_PROVIDER_SITE_OTHER): Payer: Medicare Other | Admitting: Cardiology

## 2014-09-20 ENCOUNTER — Encounter: Payer: Self-pay | Admitting: Cardiology

## 2014-09-20 VITALS — BP 118/62 | HR 82 | Ht 66.5 in | Wt 139.0 lb

## 2014-09-20 DIAGNOSIS — I480 Paroxysmal atrial fibrillation: Secondary | ICD-10-CM

## 2014-09-20 DIAGNOSIS — I351 Nonrheumatic aortic (valve) insufficiency: Secondary | ICD-10-CM | POA: Diagnosis not present

## 2014-09-20 NOTE — Patient Instructions (Addendum)
**Note De-Identified Izaiyah Kleinman Obfuscation** Medication Instructions:  Same-no change  Labwork: None  Testing/Procedures: None  Follow-Up: Your physician wants you to follow-up in: 6 months with Dr Radford Pax. You will receive a reminder letter in the mail two months in advance. If you don't receive a letter, please call our office to schedule the follow-up appointment.

## 2014-09-20 NOTE — Assessment & Plan Note (Signed)
We know that she has had some paroxysmal atrial fibrillation. She is anticoagulated. No further workup.

## 2014-09-20 NOTE — Progress Notes (Signed)
Cardiology Office Note   Date:  09/20/2014   ID:  TASHONA CALK, DOB 09-Sep-1934, MRN [id number]  PCP:  Gerrit Heck, MD  Cardiologist:  Dola Argyle, MD   Chief Complaint  Patient presents with  . Appointment    Follow-up atrial fibrillation      History of Present Illness: Samantha Henderson is a 80 y.o. female who presents today to follow-up paroxysmal atrial fibrillation. She is actually feeling well. Her last echo was done in 2015 and she is stable. She is tolerating her anticoagulation.  The patient is aware that I will retire at the end of September, 2016. Her husband is followed by Dr. Radford Pax. She would like her ongoing cardiology care to be with Dr. Radford Pax and we will arrange this.    Past Medical History  Diagnosis Date  . Pericardial effusion     small...posterior...echo..04/2007  . Hypothyroidism   . Temporal arteritis     Rule out temporal arteritis in the past  . PAC (premature atrial contraction)   . Sinus tachycardia   . Palpitation   . HTN (hypertension)   . Polymyalgia   . Leg cramps     from lipitor in the past  . Rheumatic fever     Questionable history of rheumatic fever, but no history of valvular abnormalities  . Aortic regurgitation     .mild...echo...04/2010  . Mitral regurgitation     echo..04/2007  /  mild..04/2010  . Tricuspid regurgitation     -moderate...echo..04/2007...Marland KitchenMarland KitchenEF  60%..echo..04/2007  /   55-60%....echo..05/21/2010  . Ejection fraction 2011    55-60% ejection fraction, echo, January, 2012  . Dizziness   . Pulmonary hypertension   . Diastolic dysfunction     Mild  . Abdominal bruit     Abdominal bruit in the past no aortic aneurysm by evaluation in the past  . Bradycardia   . Constipation     Constipation with diltiazem March, 2012  . Arm weakness     Arm weakness with exercise, January, 2012    Past Surgical History  Procedure Laterality Date  . Bunionectomy    . Neck surgery    . Total abdominal hysterectomy    .  Back surgery      Patient Active Problem List   Diagnosis Date Noted  . Joint pain 03/20/2014  . Hepatic cyst 12/06/2013  . Paroxysmal atrial fibrillation 11/23/2013  . Pericardial effusion   . Temporal arteritis   . PAC (premature atrial contraction)   . Sinus tachycardia   . Palpitation   . HTN (hypertension)   . Leg cramps   . Rheumatic fever   . Aortic regurgitation   . Mitral regurgitation   . Tricuspid regurgitation   . Ejection fraction   . Dizziness   . Pulmonary hypertension   . Diastolic dysfunction   . Abdominal bruit   . Bradycardia   . Constipation   . Arm weakness   . HYPOTHYROIDISM 08/01/2008  . POLYMYALGIA RHEUMATICA 08/01/2008      Current Outpatient Prescriptions  Medication Sig Dispense Refill  . apixaban (ELIQUIS) 5 MG TABS tablet Take 1 tablet (5 mg total) by mouth 2 (two) times daily. 180 tablet 1  . atorvastatin (LIPITOR) 20 MG tablet Take 20 mg by mouth daily.      . calcium carbonate (TUMS) 500 MG chewable tablet Chew 1 tablet by mouth daily.      . Cholecalciferol (VITAMIN D3) 1000 UNITS CAPS 1 tab po qd     .  diltiazem (DILTIAZEM CD) 120 MG 24 hr capsule Take 1 capsule (120 mg total) by mouth daily. 90 capsule 1  . fish oil-omega-3 fatty acids 1000 MG capsule Take 1 g by mouth daily.      Marland Kitchen FLUZONE HIGH-DOSE injection     . Glucosamine-Chondroitin-Vit C 2000-1200-60 MG/30ML LIQD Take 1 tablet by mouth daily.     . hydrochlorothiazide 25 MG tablet Take 25 mg by mouth daily.      . hydroxychloroquine (PLAQUENIL) 200 MG tablet Take 200 mg by mouth daily.   3  . levothyroxine (SYNTHROID, LEVOTHROID) 112 MCG tablet Take 112 mcg by mouth daily before breakfast.    . predniSONE (DELTASONE) 5 MG tablet Take 5 mg by mouth 2 (two) times daily with a meal.   3   No current facility-administered medications for this visit.    Allergies:   Cephalexin; Ciprofloxacin; Lisinopril; Sulfamethoxazole-trimethoprim; and Sulfonamide derivatives    Social  History:  The patient  reports that she has never smoked. She has never used smokeless tobacco.   Family History:  The patient's family history includes Heart disease in her mother.    ROS:  Please see the history of present illness.  Patient denies fever, chills, headache, sweats, rash, change in vision, change in hearing, chest pain, cough, nausea or vomiting, urinary symptoms. All other systems are reviewed and are negative.      PHYSICAL EXAM: VS:  BP 118/62 mmHg  Pulse 82  Ht 5' 6.5" (1.689 m)  Wt 139 lb (63.05 kg)  BMI 22.10 kg/m2 , Patient is oriented to person time and place. Affect is normal. Head is atraumatic. Sclera and conjunctiva are normal. There is no jugular venous distention. Lungs are clear. Respiratory effort is nonlabored. Cardiac exam reveals S1 and S2. Abdomen is soft. There is no peripheral edema. There are no musculoskeletal deformities. There are no skin rashes.  EKG:   EKG is not done today.   Recent Labs: 11/23/2013: BUN 17; Creatinine 1.1; Potassium 3.8; Sodium 135; TSH 0.96 12/28/2013: Hemoglobin 13.5; Platelets 223.0    Lipid Panel No results found for: CHOL, TRIG, HDL, CHOLHDL, VLDL, LDLCALC, LDLDIRECT    Wt Readings from Last 3 Encounters:  09/20/14 139 lb (63.05 kg)  03/20/14 136 lb (61.689 kg)  12/19/13 136 lb (61.689 kg)      Current medicines are reviewed  The patient understands her medications.     ASSESSMENT AND PLAN:

## 2014-09-20 NOTE — Assessment & Plan Note (Signed)
She has mild valvular heart disease. No further workup is needed at this time.

## 2014-10-16 ENCOUNTER — Other Ambulatory Visit: Payer: Self-pay | Admitting: Cardiology

## 2014-12-07 ENCOUNTER — Other Ambulatory Visit: Payer: Self-pay | Admitting: Cardiology

## 2015-01-01 ENCOUNTER — Other Ambulatory Visit: Payer: Self-pay | Admitting: Cardiology

## 2015-02-20 ENCOUNTER — Other Ambulatory Visit: Payer: Self-pay | Admitting: Cardiology

## 2015-02-21 ENCOUNTER — Encounter: Payer: Self-pay | Admitting: Cardiology

## 2015-04-03 ENCOUNTER — Ambulatory Visit: Payer: Medicare Other | Admitting: Cardiology

## 2015-05-01 ENCOUNTER — Ambulatory Visit (INDEPENDENT_AMBULATORY_CARE_PROVIDER_SITE_OTHER): Payer: Medicare Other | Admitting: Cardiology

## 2015-05-01 ENCOUNTER — Encounter: Payer: Self-pay | Admitting: Cardiology

## 2015-05-01 VITALS — BP 136/70 | HR 68 | Ht 66.5 in | Wt 131.6 lb

## 2015-05-01 DIAGNOSIS — I351 Nonrheumatic aortic (valve) insufficiency: Secondary | ICD-10-CM | POA: Diagnosis not present

## 2015-05-01 DIAGNOSIS — I340 Nonrheumatic mitral (valve) insufficiency: Secondary | ICD-10-CM | POA: Diagnosis not present

## 2015-05-01 DIAGNOSIS — I10 Essential (primary) hypertension: Secondary | ICD-10-CM

## 2015-05-01 DIAGNOSIS — I480 Paroxysmal atrial fibrillation: Secondary | ICD-10-CM

## 2015-05-01 LAB — CBC WITH DIFFERENTIAL/PLATELET
BASOS ABS: 0 10*3/uL (ref 0.0–0.1)
Basophils Relative: 0 % (ref 0–1)
EOS ABS: 0.1 10*3/uL (ref 0.0–0.7)
EOS PCT: 1 % (ref 0–5)
HCT: 38.8 % (ref 36.0–46.0)
Hemoglobin: 13.3 g/dL (ref 12.0–15.0)
LYMPHS ABS: 1.3 10*3/uL (ref 0.7–4.0)
Lymphocytes Relative: 18 % (ref 12–46)
MCH: 29.5 pg (ref 26.0–34.0)
MCHC: 34.3 g/dL (ref 30.0–36.0)
MCV: 86 fL (ref 78.0–100.0)
MPV: 10.9 fL (ref 8.6–12.4)
Monocytes Absolute: 0.3 10*3/uL (ref 0.1–1.0)
Monocytes Relative: 4 % (ref 3–12)
Neutro Abs: 5.6 10*3/uL (ref 1.7–7.7)
Neutrophils Relative %: 77 % (ref 43–77)
PLATELETS: 144 10*3/uL — AB (ref 150–400)
RBC: 4.51 MIL/uL (ref 3.87–5.11)
RDW: 13.3 % (ref 11.5–15.5)
WBC: 7.3 10*3/uL (ref 4.0–10.5)

## 2015-05-01 LAB — BASIC METABOLIC PANEL
BUN: 16 mg/dL (ref 7–25)
CO2: 27 mmol/L (ref 20–31)
CREATININE: 1.17 mg/dL — AB (ref 0.60–0.93)
Calcium: 9.4 mg/dL (ref 8.6–10.4)
Chloride: 99 mmol/L (ref 98–110)
Glucose, Bld: 93 mg/dL (ref 65–99)
Potassium: 3.9 mmol/L (ref 3.5–5.3)
Sodium: 135 mmol/L (ref 135–146)

## 2015-05-01 NOTE — Progress Notes (Signed)
Cardiology Office Note   Date:  05/01/2015   ID:  Samantha Henderson, DOB Apr 21, 1935, MRN [id number]  PCP:  Samantha Heck, MD    Chief Complaint  Patient presents with  . Atrial Fibrillation      History of Present Illness: Samantha Henderson is a 81 y.o. female who presents for followup of atrial fibrillation.  She is taking the anticoagulation is recommended.She denies any chest pain, pressure, SOB, DOE, LE edema unless she has been standing, claudication, dizziness or syncope. She denies any palpitations.     Past Medical History  Diagnosis Date  . Pericardial effusion     small...posterior...echo..04/2007  . Hypothyroidism   . Temporal arteritis (H[facility])     Rule out temporal arteritis in the past  . PAC (premature atrial contraction)   . Sinus tachycardia ([place])   . Palpitation   . HTN (hypertension)   . Polymyalgia ([place])   . Leg cramps     from lipitor in the past  . Rheumatic fever     Questionable history of rheumatic fever, but no history of valvular abnormalities  . Aortic regurgitation     .mild...echo...04/2010  . Mitral regurgitation     echo..04/2007  /  mild..04/2010  . Tricuspid regurgitation     -moderate...echo..04/2007...Marland KitchenMarland KitchenEF  60%..echo..04/2007  /   55-60%....echo..05/21/2010  . Ejection fraction 2011    55-60% ejection fraction, echo, January, 2012  . Dizziness   . Pulmonary hypertension ([place])   . Diastolic dysfunction     Mild  . Abdominal bruit     Abdominal bruit in the past no aortic aneurysm by evaluation in the past  . Bradycardia   . Constipation     Constipation with diltiazem March, 2012  . Arm weakness     Arm weakness with exercise, January, 2012    Past Surgical History  Procedure Laterality Date  . Bunionectomy    . Neck surgery    . Total abdominal hysterectomy    . Back surgery       Current Outpatient Prescriptions  Medication Sig Dispense Refill  . acetaminophen (TYLENOL) 325 MG tablet Take 650 mg by mouth  2 (two) times daily. For pain    . BIOTIN PO Take 500 mg by mouth daily.    . predniSONE (DELTASONE) 1 MG tablet Take 2 mg by mouth 2 (two) times daily with a meal.    . vitamin E (VITAMIN E) 1000 UNIT capsule Take 1,000 Units by mouth daily.    Marland Kitchen atorvastatin (LIPITOR) 20 MG tablet Take 20 mg by mouth daily.      . calcium carbonate (TUMS) 500 MG chewable tablet Chew 1 tablet by mouth daily.      . Cholecalciferol (VITAMIN D3) 1000 UNITS CAPS Take 1 capsule by mouth daily. 1 tab po qd    . diltiazem (CARDIZEM CD) 120 MG 24 hr capsule Take 1 capsule by mouth  daily 90 capsule 1  . ELIQUIS 5 MG TABS tablet Take 1 tablet by mouth two  times daily 180 tablet 0  . fish oil-omega-3 fatty acids 1000 MG capsule Take 1 g by mouth daily.      . hydrochlorothiazide 25 MG tablet Take 25 mg by mouth daily.      . hydroxychloroquine (PLAQUENIL) 200 MG tablet Take 400 mg by mouth daily.   3  . levothyroxine (SYNTHROID, LEVOTHROID) 112 MCG tablet Take  112 mcg by mouth daily before breakfast.     No current facility-administered medications for this visit.    Allergies:   Cephalexin; Ciprofloxacin; Lisinopril; Sulfamethoxazole-trimethoprim; and Sulfonamide derivatives    Social History:  The patient  reports that she has never smoked. She has never used smokeless tobacco.   Family History:  The patient's family history includes Heart disease in her mother.    ROS:  Please see the history of present illness.   Otherwise, review of systems are positive for none.   All other systems are reviewed and negative.    PHYSICAL EXAM: VS:  BP 136/70 mmHg  Pulse 68  Ht 5' 6.5" (1.689 m)  Wt 131 lb 9.6 oz (59.693 kg)  BMI 20.92 kg/m2 , BMI Body mass index is 20.92 kg/(m^2). GEN: Well nourished, well developed, in no acute distress HEENT: normal Neck: no JVD, carotid bruits, or masses Cardiac: RRR; no murmurs, rubs, or gallops,no edema  Respiratory:  clear to auscultation bilaterally, normal work of  breathing GI: soft, nontender, nondistended, + BS MS: no deformity or atrophy Skin: warm and dry, no rash Neuro:  Strength and sensation are intact Psych: euthymic mood, full affect   EKG:  EKG is ordered today. The ekg ordered today demonstrates NSR with PACs   Recent Labs: No results found for requested labs within last 365 days.    Lipid Panel No results found for: CHOL, TRIG, HDL, CHOLHDL, VLDL, LDLCALC, LDLDIRECT    Wt Readings from Last 3 Encounters:  05/01/15 131 lb 9.6 oz (59.693 kg)  09/20/14 139 lb (63.05 kg)  03/20/14 136 lb (61.689 kg)       ASSESSMENT AND PLAN:  1.  PAF - maintaining NSR with PACs  Continue Anticoagulation with Eliquis.   2.  Moderate MR and mild AR by echo - repeat to assess for progression 3.  HTN - controlled on current medical regimen   Current medicines are reviewed at length with the patient today.  The patient does not have concerns regarding medicines.  The following changes have been made:  no change  Labs/ tests ordered today: See above Assessment and Plan  Orders Placed This Encounter  Procedures  . Basic metabolic panel  . CBC with Differential/Platelet  . EKG 12-Lead  . ECHOCARDIOGRAM COMPLETE     Disposition:   FU with me in 6 months  Signed, Sueanne Margarita, MD  05/01/2015 2:21 PM    [facility] [address], [place], [place]  [address] Phone: [phone]; Fax: [phone]

## 2015-05-01 NOTE — Patient Instructions (Signed)
Medication Instructions:  Your physician recommends that you continue on your current medications as directed. Please refer to the Current Medication list given to you today.   Labwork: TODAY: BMET, CBC  Testing/Procedures: Your physician has requested that you have an echocardiogram. Echocardiography is a painless test that uses sound waves to create images of your heart. It provides your doctor with information about the size and shape of your heart and how well your heart's chambers and valves are working. This procedure takes approximately one hour. There are no restrictions for this procedure.  Follow-Up: Your physician wants you to follow-up in: 6 months with Dr. Turner. You will receive a reminder letter in the mail two months in advance. If you don't receive a letter, please call our office to schedule the follow-up appointment.   Any Other Special Instructions Will Be Listed Below (If Applicable).     If you need a refill on your cardiac medications before your next appointment, please call your pharmacy.   

## 2015-05-02 ENCOUNTER — Telehealth: Payer: Self-pay | Admitting: Cardiology

## 2015-05-02 NOTE — Telephone Encounter (Signed)
-----   Message from Sueanne Margarita, MD sent at 05/01/2015 10:34 PM EST ----- Please let patient know that labs were normal.  Continue current medical therapy.

## 2015-05-02 NOTE — Telephone Encounter (Signed)
New message ° ° ° ° °Returning a call to the nurse °

## 2015-05-02 NOTE — Telephone Encounter (Signed)
Informed patient of results and verbal understanding expressed.  

## 2015-05-07 ENCOUNTER — Other Ambulatory Visit: Payer: Self-pay | Admitting: Cardiology

## 2015-05-14 ENCOUNTER — Ambulatory Visit (HOSPITAL_COMMUNITY): Payer: Medicare Other | Attending: Internal Medicine

## 2015-05-14 ENCOUNTER — Other Ambulatory Visit: Payer: Self-pay

## 2015-05-14 DIAGNOSIS — I071 Rheumatic tricuspid insufficiency: Secondary | ICD-10-CM | POA: Diagnosis not present

## 2015-05-14 DIAGNOSIS — I340 Nonrheumatic mitral (valve) insufficiency: Secondary | ICD-10-CM | POA: Diagnosis not present

## 2015-05-14 DIAGNOSIS — I700 Atherosclerosis of aorta: Secondary | ICD-10-CM | POA: Diagnosis not present

## 2015-05-14 DIAGNOSIS — I313 Pericardial effusion (noninflammatory): Secondary | ICD-10-CM | POA: Diagnosis not present

## 2015-05-14 DIAGNOSIS — I351 Nonrheumatic aortic (valve) insufficiency: Secondary | ICD-10-CM | POA: Diagnosis not present

## 2015-05-14 DIAGNOSIS — Z8249 Family history of ischemic heart disease and other diseases of the circulatory system: Secondary | ICD-10-CM | POA: Diagnosis not present

## 2015-05-14 DIAGNOSIS — I10 Essential (primary) hypertension: Secondary | ICD-10-CM | POA: Diagnosis not present

## 2015-05-14 DIAGNOSIS — I059 Rheumatic mitral valve disease, unspecified: Secondary | ICD-10-CM | POA: Diagnosis present

## 2015-05-17 ENCOUNTER — Telehealth: Payer: Self-pay

## 2015-05-17 DIAGNOSIS — I340 Nonrheumatic mitral (valve) insufficiency: Secondary | ICD-10-CM

## 2015-05-17 NOTE — Telephone Encounter (Signed)
-----   Message from Sueanne Margarita, MD sent at 05/14/2015  7:28 PM EST ----- Normal LVF with mild AR, moderate MR/TR and trivial pericardia effusion - no change from echo 2015 - repeat echo in 1 year for MR

## 2015-05-17 NOTE — Telephone Encounter (Signed)
Informed patient of results and verbal understanding expressed.  Repeat ECHO ordered to be scheduled in 1 year. Patient agrees with treatment plan. 

## 2015-09-26 ENCOUNTER — Ambulatory Visit (INDEPENDENT_AMBULATORY_CARE_PROVIDER_SITE_OTHER): Payer: Medicare Other | Admitting: Ophthalmology

## 2015-09-26 DIAGNOSIS — M069 Rheumatoid arthritis, unspecified: Secondary | ICD-10-CM | POA: Diagnosis not present

## 2015-09-26 DIAGNOSIS — H33302 Unspecified retinal break, left eye: Secondary | ICD-10-CM

## 2015-09-26 DIAGNOSIS — I10 Essential (primary) hypertension: Secondary | ICD-10-CM

## 2015-09-26 DIAGNOSIS — H35033 Hypertensive retinopathy, bilateral: Secondary | ICD-10-CM

## 2015-09-26 DIAGNOSIS — H43813 Vitreous degeneration, bilateral: Secondary | ICD-10-CM

## 2015-09-26 DIAGNOSIS — Z79899 Other long term (current) drug therapy: Secondary | ICD-10-CM

## 2015-10-17 ENCOUNTER — Other Ambulatory Visit: Payer: Self-pay | Admitting: Cardiology

## 2016-04-12 ENCOUNTER — Emergency Department (HOSPITAL_COMMUNITY): Payer: Medicare Other | Admitting: Anesthesiology

## 2016-04-12 ENCOUNTER — Inpatient Hospital Stay (HOSPITAL_COMMUNITY): Payer: Medicare Other

## 2016-04-12 ENCOUNTER — Inpatient Hospital Stay (HOSPITAL_COMMUNITY)
Admission: EM | Admit: 2016-04-12 | Discharge: 2016-04-16 | DRG: 481 | Disposition: A | Discharge status: Skilled Nursing Facility | Payer: Medicare Other | Attending: Internal Medicine | Admitting: Internal Medicine

## 2016-04-12 ENCOUNTER — Emergency Department (HOSPITAL_COMMUNITY): Payer: Medicare Other

## 2016-04-12 ENCOUNTER — Encounter (HOSPITAL_COMMUNITY): Payer: Self-pay | Admitting: Nurse Practitioner

## 2016-04-12 ENCOUNTER — Encounter (HOSPITAL_COMMUNITY)
Admission: EM | Disposition: A | Discharge status: Skilled Nursing Facility | Payer: Self-pay | Source: Home / Self Care | Attending: Internal Medicine

## 2016-04-12 DIAGNOSIS — Y92009 Unspecified place in unspecified non-institutional (private) residence as the place of occurrence of the external cause: Secondary | ICD-10-CM | POA: Diagnosis not present

## 2016-04-12 DIAGNOSIS — E039 Hypothyroidism, unspecified: Secondary | ICD-10-CM | POA: Diagnosis present

## 2016-04-12 DIAGNOSIS — S72012A Unspecified intracapsular fracture of left femur, initial encounter for closed fracture: Secondary | ICD-10-CM | POA: Diagnosis present

## 2016-04-12 DIAGNOSIS — E785 Hyperlipidemia, unspecified: Secondary | ICD-10-CM | POA: Diagnosis present

## 2016-04-12 DIAGNOSIS — M353 Polymyalgia rheumatica: Secondary | ICD-10-CM | POA: Diagnosis present

## 2016-04-12 DIAGNOSIS — I340 Nonrheumatic mitral (valve) insufficiency: Secondary | ICD-10-CM | POA: Diagnosis present

## 2016-04-12 DIAGNOSIS — M316 Other giant cell arteritis: Secondary | ICD-10-CM | POA: Diagnosis present

## 2016-04-12 DIAGNOSIS — I272 Pulmonary hypertension, unspecified: Secondary | ICD-10-CM | POA: Diagnosis present

## 2016-04-12 DIAGNOSIS — M810 Age-related osteoporosis without current pathological fracture: Secondary | ICD-10-CM | POA: Diagnosis present

## 2016-04-12 DIAGNOSIS — S72002A Fracture of unspecified part of neck of left femur, initial encounter for closed fracture: Secondary | ICD-10-CM | POA: Diagnosis present

## 2016-04-12 DIAGNOSIS — R262 Difficulty in walking, not elsewhere classified: Secondary | ICD-10-CM

## 2016-04-12 DIAGNOSIS — I4819 Other persistent atrial fibrillation: Secondary | ICD-10-CM | POA: Diagnosis present

## 2016-04-12 DIAGNOSIS — Z9889 Other specified postprocedural states: Secondary | ICD-10-CM

## 2016-04-12 DIAGNOSIS — Z8781 Personal history of (healed) traumatic fracture: Secondary | ICD-10-CM

## 2016-04-12 DIAGNOSIS — S72009A Fracture of unspecified part of neck of unspecified femur, initial encounter for closed fracture: Secondary | ICD-10-CM

## 2016-04-12 DIAGNOSIS — I110 Hypertensive heart disease with heart failure: Secondary | ICD-10-CM | POA: Diagnosis present

## 2016-04-12 DIAGNOSIS — M315 Giant cell arteritis with polymyalgia rheumatica: Secondary | ICD-10-CM | POA: Diagnosis present

## 2016-04-12 DIAGNOSIS — I351 Nonrheumatic aortic (valve) insufficiency: Secondary | ICD-10-CM | POA: Diagnosis present

## 2016-04-12 DIAGNOSIS — I5189 Other ill-defined heart diseases: Secondary | ICD-10-CM | POA: Diagnosis present

## 2016-04-12 DIAGNOSIS — M1990 Unspecified osteoarthritis, unspecified site: Secondary | ICD-10-CM | POA: Diagnosis present

## 2016-04-12 DIAGNOSIS — I480 Paroxysmal atrial fibrillation: Secondary | ICD-10-CM | POA: Diagnosis present

## 2016-04-12 DIAGNOSIS — W010XXA Fall on same level from slipping, tripping and stumbling without subsequent striking against object, initial encounter: Secondary | ICD-10-CM | POA: Diagnosis present

## 2016-04-12 DIAGNOSIS — I5032 Chronic diastolic (congestive) heart failure: Secondary | ICD-10-CM | POA: Diagnosis present

## 2016-04-12 HISTORY — PX: HIP PINNING,CANNULATED: SHX1758

## 2016-04-12 LAB — BASIC METABOLIC PANEL
ANION GAP: 9 (ref 5–15)
BUN: 27 mg/dL — ABNORMAL HIGH (ref 6–20)
CALCIUM: 8.9 mg/dL (ref 8.9–10.3)
CHLORIDE: 103 mmol/L (ref 101–111)
CO2: 26 mmol/L (ref 22–32)
Creatinine, Ser: 0.94 mg/dL (ref 0.44–1.00)
GFR calc non Af Amer: 56 mL/min — ABNORMAL LOW (ref >60)
Glucose, Bld: 89 mg/dL (ref 65–99)
Potassium: 3 mmol/L — ABNORMAL LOW (ref 3.5–5.1)
Sodium: 138 mmol/L (ref 135–145)

## 2016-04-12 LAB — CBC
HCT: 41.9 % (ref 36.0–46.0)
Hemoglobin: 14.2 g/dL (ref 12.0–15.0)
MCH: 29.8 pg (ref 26.0–34.0)
MCHC: 33.9 g/dL (ref 30.0–36.0)
MCV: 88 fL (ref 78.0–100.0)
PLATELETS: 132 10*3/uL — AB (ref 150–400)
RBC: 4.76 MIL/uL (ref 3.87–5.11)
RDW: 12.8 % (ref 11.5–15.5)
WBC: 14 10*3/uL — AB (ref 4.0–10.5)

## 2016-04-12 LAB — PROTIME-INR
INR: 1.23
Prothrombin Time: 15.6 seconds — ABNORMAL HIGH (ref 11.4–15.2)

## 2016-04-12 SURGERY — FIXATION, FEMUR, NECK, PERCUTANEOUS, USING SCREW
Anesthesia: General | Site: Hip | Laterality: Left

## 2016-04-12 MED ORDER — MENTHOL 3 MG MT LOZG
1.0000 | LOZENGE | OROMUCOSAL | Status: DC | PRN
Start: 2016-04-12 — End: 2016-04-16

## 2016-04-12 MED ORDER — ONDANSETRON HCL 4 MG/2ML IJ SOLN: 4.0000 mg | Freq: Four times a day (QID) | INTRAMUSCULAR | Status: DC | PRN

## 2016-04-12 MED ORDER — ATORVASTATIN CALCIUM 20 MG PO TABS
20.0000 mg | ORAL_TABLET | Freq: Every day | ORAL | Status: DC
  Administered 2016-04-13 – 2016-04-16 (×4): 20 mg via ORAL
  Filled 2016-04-12 (×4): qty 1

## 2016-04-12 MED ORDER — DILTIAZEM HCL ER COATED BEADS 120 MG PO CP24
120.0000 mg | ORAL_CAPSULE | Freq: Every day | ORAL | Status: DC
  Administered 2016-04-15 – 2016-04-16 (×2): 120 mg via ORAL
  Filled 2016-04-12 (×3): qty 1

## 2016-04-12 MED ORDER — BUPIVACAINE HCL (PF) 0.25 % IJ SOLN
INTRAMUSCULAR | Status: AC
  Filled 2016-04-12: qty 30

## 2016-04-12 MED ORDER — DOCUSATE SODIUM 100 MG PO CAPS
100.0000 mg | ORAL_CAPSULE | Freq: Two times a day (BID) | ORAL | Status: DC
  Administered 2016-04-13 – 2016-04-16 (×8): 100 mg via ORAL
  Filled 2016-04-12 (×8): qty 1

## 2016-04-12 MED ORDER — ONDANSETRON HCL 4 MG/2ML IJ SOLN
4.0000 mg | Freq: Four times a day (QID) | INTRAMUSCULAR | Status: DC
  Administered 2016-04-12: 4 mg via INTRAVENOUS
  Filled 2016-04-12: qty 2

## 2016-04-12 MED ORDER — 0.9 % SODIUM CHLORIDE (POUR BTL) OPTIME
TOPICAL | Status: DC | PRN
  Administered 2016-04-12: 1000 mL

## 2016-04-12 MED ORDER — VITAMIN D 1000 UNITS PO TABS
1000.0000 [IU] | ORAL_TABLET | Freq: Every day | ORAL | Status: DC
  Administered 2016-04-13 – 2016-04-16 (×4): 1000 [IU] via ORAL
  Filled 2016-04-12 (×4): qty 1

## 2016-04-12 MED ORDER — LEVOTHYROXINE SODIUM 112 MCG PO TABS
112.0000 ug | ORAL_TABLET | Freq: Every day | ORAL | Status: DC
  Administered 2016-04-13 – 2016-04-16 (×4): 112 ug via ORAL
  Filled 2016-04-12 (×4): qty 1

## 2016-04-12 MED ORDER — FENTANYL CITRATE (PF) 100 MCG/2ML IJ SOLN
INTRAMUSCULAR | Status: AC
  Administered 2016-04-12: 50 ug via INTRAVENOUS
  Filled 2016-04-12: qty 2

## 2016-04-12 MED ORDER — HYDROCODONE-ACETAMINOPHEN 5-325 MG PO TABS
1.0000 | ORAL_TABLET | Freq: Four times a day (QID) | ORAL | Status: DC | PRN
  Administered 2016-04-13 (×3): 1 via ORAL
  Administered 2016-04-14 – 2016-04-16 (×8): 2 via ORAL
  Filled 2016-04-12 (×2): qty 2
  Filled 2016-04-12: qty 1
  Filled 2016-04-12 (×5): qty 2
  Filled 2016-04-12 (×2): qty 1
  Filled 2016-04-12: qty 2

## 2016-04-12 MED ORDER — FERROUS SULFATE 325 (65 FE) MG PO TABS
325.0000 mg | ORAL_TABLET | Freq: Three times a day (TID) | ORAL | Status: DC
  Administered 2016-04-13 – 2016-04-16 (×9): 325 mg via ORAL
  Filled 2016-04-12 (×9): qty 1

## 2016-04-12 MED ORDER — POVIDONE-IODINE 10 % EX SWAB: 2.0000 "application " | Freq: Once | CUTANEOUS | Status: DC

## 2016-04-12 MED ORDER — OMEGA-3-ACID ETHYL ESTERS 1 G PO CAPS
1.0000 g | ORAL_CAPSULE | Freq: Every day | ORAL | Status: DC
  Administered 2016-04-13 – 2016-04-16 (×3): 1 g via ORAL
  Filled 2016-04-12 (×3): qty 1

## 2016-04-12 MED ORDER — SENNA 8.6 MG PO TABS
1.0000 | ORAL_TABLET | Freq: Two times a day (BID) | ORAL | Status: DC
  Administered 2016-04-13 – 2016-04-16 (×8): 8.6 mg via ORAL
  Filled 2016-04-12 (×8): qty 1

## 2016-04-12 MED ORDER — BISACODYL 10 MG RE SUPP: 10.0000 mg | Freq: Every day | RECTAL | Status: DC | PRN

## 2016-04-12 MED ORDER — VITAMIN E 180 MG (400 UNIT) PO CAPS
800.0000 [IU] | ORAL_CAPSULE | Freq: Every day | ORAL | Status: DC
  Administered 2016-04-13 – 2016-04-16 (×4): 800 [IU] via ORAL
  Filled 2016-04-12 (×4): qty 2

## 2016-04-12 MED ORDER — HYDROCHLOROTHIAZIDE 25 MG PO TABS
25.0000 mg | ORAL_TABLET | Freq: Every day | ORAL | Status: DC
  Administered 2016-04-15 – 2016-04-16 (×2): 25 mg via ORAL
  Filled 2016-04-12 (×4): qty 1

## 2016-04-12 MED ORDER — MORPHINE SULFATE (PF) 2 MG/ML IV SOLN
2.0000 mg | INTRAVENOUS | Status: DC | PRN
  Administered 2016-04-13 – 2016-04-14 (×8): 2 mg via INTRAVENOUS
  Filled 2016-04-12 (×8): qty 1

## 2016-04-12 MED ORDER — ROCURONIUM BROMIDE 100 MG/10ML IV SOLN
INTRAVENOUS | Status: DC | PRN
  Administered 2016-04-12: 30 mg via INTRAVENOUS

## 2016-04-12 MED ORDER — ONDANSETRON HCL 4 MG PO TABS
4.0000 mg | ORAL_TABLET | Freq: Four times a day (QID) | ORAL | Status: DC | PRN
  Administered 2016-04-15 – 2016-04-16 (×2): 4 mg via ORAL
  Filled 2016-04-12 (×2): qty 1

## 2016-04-12 MED ORDER — SODIUM CHLORIDE 0.9 % IV SOLN
75.0000 mL/h | INTRAVENOUS | Status: DC
  Administered 2016-04-13: 75 mL/h via INTRAVENOUS

## 2016-04-12 MED ORDER — HYDROXYCHLOROQUINE SULFATE 200 MG PO TABS
400.0000 mg | ORAL_TABLET | Freq: Every day | ORAL | Status: DC
  Administered 2016-04-14 – 2016-04-16 (×3): 400 mg via ORAL
  Filled 2016-04-12 (×4): qty 2

## 2016-04-12 MED ORDER — CEFAZOLIN SODIUM-DEXTROSE 2-4 GM/100ML-% IV SOLN: 2.0000 g | INTRAVENOUS | Status: DC

## 2016-04-12 MED ORDER — OXYCODONE HCL 5 MG/5ML PO SOLN: 5.0000 mg | Freq: Once | ORAL | Status: AC | PRN

## 2016-04-12 MED ORDER — ACETAMINOPHEN 325 MG PO TABS
650.0000 mg | ORAL_TABLET | Freq: Four times a day (QID) | ORAL | Status: DC | PRN
  Administered 2016-04-13 – 2016-04-16 (×2): 650 mg via ORAL
  Filled 2016-04-12 (×2): qty 2

## 2016-04-12 MED ORDER — MAGNESIUM CITRATE PO SOLN: 1.0000 | Freq: Once | ORAL | Status: DC | PRN

## 2016-04-12 MED ORDER — TRAMADOL HCL 50 MG PO TABS: 50.0000 mg | ORAL_TABLET | Freq: Four times a day (QID) | ORAL | 0 refills | Status: DC | PRN

## 2016-04-12 MED ORDER — CALCIUM CARBONATE ANTACID 500 MG PO CHEW
1.0000 | CHEWABLE_TABLET | Freq: Every day | ORAL | Status: DC
  Administered 2016-04-13 – 2016-04-15 (×3): 200 mg via ORAL
  Filled 2016-04-12 (×3): qty 1

## 2016-04-12 MED ORDER — BIOTIN 2.5 MG PO TABS: 500.0000 mg | ORAL_TABLET | Freq: Every day | ORAL | Status: DC

## 2016-04-12 MED ORDER — SUGAMMADEX SODIUM 200 MG/2ML IV SOLN
INTRAVENOUS | Status: DC | PRN
  Administered 2016-04-12: 200 mg via INTRAVENOUS

## 2016-04-12 MED ORDER — POLYETHYLENE GLYCOL 3350 17 G PO PACK: 17.0000 g | PACK | Freq: Every day | ORAL | Status: DC | PRN

## 2016-04-12 MED ORDER — PHENOL 1.4 % MT LIQD: 1.0000 | OROMUCOSAL | Status: DC | PRN

## 2016-04-12 MED ORDER — EPHEDRINE SULFATE 50 MG/ML IJ SOLN
INTRAMUSCULAR | Status: DC | PRN
  Administered 2016-04-12: 5 mg via INTRAVENOUS

## 2016-04-12 MED ORDER — ACETAMINOPHEN 650 MG RE SUPP: 650.0000 mg | Freq: Four times a day (QID) | RECTAL | Status: DC | PRN

## 2016-04-12 MED ORDER — PREDNISONE 1 MG PO TABS
2.0000 mg | ORAL_TABLET | Freq: Two times a day (BID) | ORAL | Status: DC
  Administered 2016-04-13 – 2016-04-16 (×6): 2 mg via ORAL
  Filled 2016-04-12 (×7): qty 2

## 2016-04-12 MED ORDER — SENNA-DOCUSATE SODIUM 8.6-50 MG PO TABS: 2.0000 | ORAL_TABLET | Freq: Every day | ORAL | 1 refills | Status: DC

## 2016-04-12 MED ORDER — LIDOCAINE HCL (CARDIAC) 20 MG/ML IV SOLN
INTRAVENOUS | Status: DC | PRN
  Administered 2016-04-12: 60 mg via INTRAVENOUS

## 2016-04-12 MED ORDER — FENTANYL CITRATE (PF) 100 MCG/2ML IJ SOLN
INTRAMUSCULAR | Status: AC
  Filled 2016-04-12: qty 2

## 2016-04-12 MED ORDER — OXYCODONE HCL 5 MG PO TABS
ORAL_TABLET | ORAL | Status: AC
  Filled 2016-04-12: qty 1

## 2016-04-12 MED ORDER — CHLORHEXIDINE GLUCONATE 4 % EX LIQD: 60.0000 mL | Freq: Once | CUTANEOUS | Status: DC

## 2016-04-12 MED ORDER — MORPHINE SULFATE (PF) 2 MG/ML IV SOLN
2.0000 mg | INTRAVENOUS | Status: DC | PRN
  Administered 2016-04-12: 2 mg via INTRAVENOUS
  Filled 2016-04-12: qty 1

## 2016-04-12 MED ORDER — OXYCODONE HCL 5 MG PO TABS
5.0000 mg | ORAL_TABLET | Freq: Once | ORAL | Status: AC | PRN
Start: 2016-04-12 — End: 2016-04-12
  Administered 2016-04-12: 5 mg via ORAL

## 2016-04-12 MED ORDER — ONDANSETRON HCL 4 MG/2ML IJ SOLN
INTRAMUSCULAR | Status: DC | PRN
Start: 2016-04-12 — End: 2016-04-12
  Administered 2016-04-12: 4 mg via INTRAVENOUS

## 2016-04-12 MED ORDER — ALUM & MAG HYDROXIDE-SIMETH 200-200-20 MG/5ML PO SUSP
30.0000 mL | ORAL | Status: DC | PRN
  Administered 2016-04-15: 30 mL via ORAL
  Filled 2016-04-12: qty 30

## 2016-04-12 MED ORDER — APIXABAN 5 MG PO TABS
5.0000 mg | ORAL_TABLET | Freq: Two times a day (BID) | ORAL | Status: DC
  Administered 2016-04-13 – 2016-04-16 (×7): 5 mg via ORAL
  Filled 2016-04-12 (×7): qty 1

## 2016-04-12 MED ORDER — LACTATED RINGERS IV SOLN
INTRAVENOUS | Status: DC | PRN
  Administered 2016-04-12 (×2): via INTRAVENOUS

## 2016-04-12 MED ORDER — FENTANYL CITRATE (PF) 100 MCG/2ML IJ SOLN
25.0000 ug | INTRAMUSCULAR | Status: DC | PRN
  Administered 2016-04-12 (×2): 50 ug via INTRAVENOUS

## 2016-04-12 MED ORDER — FENTANYL CITRATE (PF) 100 MCG/2ML IJ SOLN
INTRAMUSCULAR | Status: DC | PRN
  Administered 2016-04-12: 50 ug via INTRAVENOUS

## 2016-04-12 MED ORDER — CEFAZOLIN SODIUM-DEXTROSE 2-3 GM-% IV SOLR
INTRAVENOUS | Status: DC | PRN
  Administered 2016-04-12: 2 g via INTRAVENOUS

## 2016-04-12 MED ORDER — PROPOFOL 10 MG/ML IV BOLUS
INTRAVENOUS | Status: DC | PRN
  Administered 2016-04-12: 100 mg via INTRAVENOUS
  Administered 2016-04-12: 20 mg via INTRAVENOUS

## 2016-04-12 SURGICAL SUPPLY — 41 items
BENZOIN TINCTURE PRP APPL 2/3 (GAUZE/BANDAGES/DRESSINGS) IMPLANT
BIT DRILL CANNULATED (DRILL) ×1 IMPLANT
BNDG COHESIVE 6X5 TAN STRL LF (GAUZE/BANDAGES/DRESSINGS) ×2 IMPLANT
BOOTCOVER CLEANROOM LRG (PROTECTIVE WEAR) ×4 IMPLANT
CLSR STERI-STRIP ANTIMIC 1/2X4 (GAUZE/BANDAGES/DRESSINGS) ×2 IMPLANT
COVER PERINEAL POST (MISCELLANEOUS) ×2 IMPLANT
COVER SURGICAL LIGHT HANDLE (MISCELLANEOUS) ×2 IMPLANT
DRAPE STERI IOBAN 125X83 (DRAPES) ×2 IMPLANT
DRILL CANNULATED (DRILL) ×2
DRSG MEPILEX BORDER 4X4 (GAUZE/BANDAGES/DRESSINGS) ×2 IMPLANT
DURAPREP 26ML APPLICATOR (WOUND CARE) ×2 IMPLANT
ELECT REM PT RETURN 9FT ADLT (ELECTROSURGICAL) ×2
ELECTRODE REM PT RTRN 9FT ADLT (ELECTROSURGICAL) ×1 IMPLANT
GLOVE BIOGEL PI ORTHO PRO SZ8 (GLOVE) ×2
GLOVE ORTHO TXT STRL SZ7.5 (GLOVE) ×2 IMPLANT
GLOVE PI ORTHO PRO STRL SZ8 (GLOVE) ×2 IMPLANT
GLOVE SURG ORTHO 8.0 STRL STRW (GLOVE) ×4 IMPLANT
GOWN STRL REUS W/ TWL XL LVL3 (GOWN DISPOSABLE) ×1 IMPLANT
GOWN STRL REUS W/TWL 2XL LVL3 (GOWN DISPOSABLE) IMPLANT
GOWN STRL REUS W/TWL XL LVL3 (GOWN DISPOSABLE) ×1
GUIDEWIRE THREAD TIP 3.2X9 (WIRE) ×6 IMPLANT
KIT BASIN OR (CUSTOM PROCEDURE TRAY) ×2 IMPLANT
KIT ROOM TURNOVER OR (KITS) ×2 IMPLANT
LINER BOOT UNIVERSAL DISP (MISCELLANEOUS) ×2 IMPLANT
NEEDLE 22X1 1/2 (OR ONLY) (NEEDLE) ×2 IMPLANT
NS IRRIG 1000ML POUR BTL (IV SOLUTION) ×2 IMPLANT
PACK GENERAL/GYN (CUSTOM PROCEDURE TRAY) ×2 IMPLANT
PAD ARMBOARD 7.5X6 YLW CONV (MISCELLANEOUS) ×4 IMPLANT
SCREW CANN RVR CUT FLUT 90X16X (Screw) ×1 IMPLANT
SCREW CANN RVRS CUT FLT 85X16 (Screw) ×1 IMPLANT
SCREW CANNULATED 6.5X85MM (Screw) ×1 IMPLANT
SCREW CANNULATED 6.5X90 (Screw) ×1 IMPLANT
SCREW CANNULATED 6.5X95 (Screw) ×2 IMPLANT
SUT VIC AB 2-0 FS1 27 (SUTURE) ×2 IMPLANT
SUT VIC AB 2-0 SH 27 (SUTURE) ×1
SUT VIC AB 2-0 SH 27XBRD (SUTURE) ×1 IMPLANT
SUT VIC AB 3-0 SH 27 (SUTURE) ×1
SUT VIC AB 3-0 SH 27X BRD (SUTURE) ×1 IMPLANT
SYR CONTROL 10ML LL (SYRINGE) ×2 IMPLANT
TOWEL OR 17X26 10 PK STRL BLUE (TOWEL DISPOSABLE) ×2 IMPLANT
TRAY FOLEY W/METER SILVER 16FR (SET/KITS/TRAYS/PACK) ×2 IMPLANT

## 2016-04-12 NOTE — Transfer of Care (Signed)
Immediate Anesthesia Transfer of Care Note  Patient: Samantha Henderson  Procedure(s) Performed: Procedure(s): CANNULATED HIP PINNING (Left)  Patient Location: PACU  Anesthesia Type:General  Level of Consciousness: awake, alert  and oriented  Airway & Oxygen Therapy: Patient Spontanous Breathing and Patient connected to face mask oxygen  Post-op Assessment: Report given to RN and Post -op Vital signs reviewed and stable  Post vital signs: Reviewed and stable  Last Vitals:  Vitals:   04/12/16 1907 04/12/16 1930  BP: 130/73   Pulse: 68 (!) 59  Resp: 22 16  Temp:      Last Pain:  Vitals:   04/12/16 1646  TempSrc:   PainSc: 10-Worst pain ever         Complications: No apparent anesthesia complications

## 2016-04-12 NOTE — ED Notes (Signed)
Report called to PACU

## 2016-04-12 NOTE — ED Triage Notes (Signed)
Pt states she had a mechanical fall, tripped by warm covers, landed on her left hip that she is c/o pain. Requesting and X-ray.

## 2016-04-12 NOTE — ED Provider Notes (Signed)
[facility] DEPT Provider Note   CSN: [id number] Arrival date & time: 04/12/16  1549  History   Chief Complaint Chief Complaint  Patient presents with  . Hip Pain    left hip  . Fall    HPI Samantha Henderson is a 81 y.o. female.  HPI  81 y.o. female with a hx of HTN, presents to the Emergency Department today complaining of left hip pain s/p mechanical fall. Pt states she got tangled in her throw blanket and fell on her left hip. Notes immediate pain. Notes pain with ambulation. Notes mild numbness as well. States pain is 8/10 currently. No N/V. No CP/SOB/ABD pain. No head trauma. Pt takes Eliquis for "a leaky valve." No other symptoms noted.    Past Medical History:  Diagnosis Date  . Abdominal bruit    Abdominal bruit in the past no aortic aneurysm by evaluation in the past  . Aortic regurgitation    .mild...echo...04/2010  . Arm weakness    Arm weakness with exercise, January, 2012  . Bradycardia   . Constipation    Constipation with diltiazem March, 2012  . Diastolic dysfunction    Mild  . Dizziness   . Ejection fraction 2011   55-60% ejection fraction, echo, January, 2012  . HTN (hypertension)   . Hypothyroidism   . Leg cramps    from lipitor in the past  . Mitral regurgitation    echo..04/2007  /  mild..04/2010  . PAC (premature atrial contraction)   . Palpitation   . Pericardial effusion    small...posterior...echo..04/2007  . Polymyalgia ([place])   . Pulmonary hypertension   . Rheumatic fever    Questionable history of rheumatic fever, but no history of valvular abnormalities  . Sinus tachycardia   . Temporal arteritis (HCC)    Rule out temporal arteritis in the past  . Tricuspid regurgitation    -moderate...echo..04/2007...Marland KitchenMarland KitchenEF  60%..echo..04/2007  /   55-60%....echo..05/21/2010    Patient Active Problem List   Diagnosis Date Noted  . Joint pain 03/20/2014  . Hepatic cyst 12/06/2013  . Paroxysmal atrial fibrillation ([place]) 11/23/2013  . Pericardial effusion    . Temporal arteritis ([place])   . PAC (premature atrial contraction)   . Sinus tachycardia   . Palpitation   . HTN (hypertension)   . Leg cramps   . Rheumatic fever   . Aortic regurgitation   . Mitral regurgitation   . Tricuspid regurgitation   . Ejection fraction   . Dizziness   . Pulmonary hypertension   . Diastolic dysfunction   . Abdominal bruit   . Bradycardia   . Constipation   . Arm weakness   . HYPOTHYROIDISM 08/01/2008  . POLYMYALGIA RHEUMATICA 08/01/2008    Past Surgical History:  Procedure Laterality Date  . BACK SURGERY    . BUNIONECTOMY    . NECK SURGERY    . TOTAL ABDOMINAL HYSTERECTOMY      OB History    No data available       Home Medications    Prior to Admission medications   Medication Sig Start Date End Date Taking? Authorizing Provider  acetaminophen (TYLENOL) 325 MG tablet Take 650 mg by mouth 2 (two) times daily. For pain    Historical Provider, MD  atorvastatin (LIPITOR) 20 MG tablet Take 20 mg by mouth daily.      Historical Provider, MD  BIOTIN PO Take 500 mg by mouth daily.    Historical Provider, MD  calcium carbonate (TUMS) 500 MG  chewable tablet Chew 1 tablet by mouth daily.      Historical Provider, MD  Cholecalciferol (VITAMIN D3) 1000 UNITS CAPS Take 1 capsule by mouth daily. 1 tab po qd    Historical Provider, MD  diltiazem (CARDIZEM CD) 120 MG 24 hr capsule Take 1 capsule (120 mg total) by mouth daily. Please call and schedule a 6 month follow up appointment per last office visit 10/17/15   Sueanne Margarita, MD  ELIQUIS 5 MG TABS tablet Take 1 tablet by mouth two  times daily 05/07/15   Sueanne Margarita, MD  fish oil-omega-3 fatty acids 1000 MG capsule Take 1 g by mouth daily.      Historical Provider, MD  hydrochlorothiazide 25 MG tablet Take 25 mg by mouth daily.      Historical Provider, MD  hydroxychloroquine (PLAQUENIL) 200 MG tablet Take 400 mg by mouth daily.  08/09/14   Historical Provider, MD  levothyroxine (SYNTHROID, LEVOTHROID)  112 MCG tablet Take 112 mcg by mouth daily before breakfast.    Historical Provider, MD  predniSONE (DELTASONE) 1 MG tablet Take 2 mg by mouth 2 (two) times daily with a meal.    Historical Provider, MD  vitamin E (VITAMIN E) 1000 UNIT capsule Take 1,000 Units by mouth daily.    Historical Provider, MD    Family History Family History  Problem Relation Age of Onset  . Heart disease Mother     Social History Social History  Substance Use Topics  . Smoking status: Never Smoker  . Smokeless tobacco: Never Used  . Alcohol use Not on file     Allergies   Cephalexin; Ciprofloxacin; Lisinopril; Sulfamethoxazole-trimethoprim; and Sulfonamide derivatives   Review of Systems Review of Systems ROS reviewed and all are negative for acute change except as noted in the HPI.  Physical Exam Updated Vital Signs BP 131/71 (BP Location: Right Arm)   Pulse 69   Temp 98.6 F (37 C) (Oral)   Resp 18   SpO2 98%   Physical Exam  Constitutional: She is oriented to person, place, and time. Vital signs are normal. She appears well-developed and well-nourished.  HENT:  Head: Normocephalic and atraumatic.  Right Ear: Hearing normal.  Left Ear: Hearing normal.  Eyes: Conjunctivae and EOM are normal. Pupils are equal, round, and reactive to light.  Neck: Normal range of motion. Neck supple.  Cardiovascular: Normal rate and regular rhythm.   Pulmonary/Chest: Effort normal.  Musculoskeletal: Normal range of motion.  Left Hip pain TTP along posterior/lateral aspect. No swelling. No ecchymosis. No erythema. Pain with external rotation. NVI. Distal pulses appreciated.    Neurological: She is alert and oriented to person, place, and time.  Skin: Skin is warm and dry.  Psychiatric: She has a normal mood and affect. Her speech is normal and behavior is normal. Thought content normal.  Nursing note and vitals reviewed.  ED Treatments / Results  Labs (all labs ordered are listed, but only abnormal  results are displayed) Labs Reviewed  CBC  BASIC METABOLIC PANEL  PROTIME-INR    EKG  EKG Interpretation None      Radiology Dg Hip Unilat With Pelvis 2-3 Views Left  Result Date: 04/12/2016 CLINICAL DATA:  LEFT hip pain, tripped over a blanket while getting out of chair this evening, unable to rise from lying on x-ray table post imaging, hip pain post fall EXAM: DG HIP (WITH OR WITHOUT PELVIS) 2-3V LEFT COMPARISON:  None FINDINGS: Diffuse osseous demineralization. Hip and SI joint  spaces symmetric and preserved. Subcapital fracture of the LEFT femoral neck, minimally displaced. No dislocation. No additional fracture, dislocation or bone destruction. IMPRESSION: Minimally displaced subcapital fracture LEFT femoral neck. Osseous demineralization. Electronically Signed   By: Lavonia Dana M.D.   On: 04/12/2016 17:35    Procedures Procedures (including critical care time)  Medications Ordered in ED Medications - No data to display   Initial Impression / Assessment and Plan / ED Course  I have reviewed the triage vital signs and the nursing notes.  Pertinent labs & imaging results that were available during my care of the patient were reviewed by me and considered in my medical decision making (see chart for details).  Clinical Course    Final Clinical Impressions(s) / ED Diagnoses  {I have reviewed and evaluated the relevant laboratory values. {I have reviewed and evaluated the relevant imaging studies.  {I have reviewed the relevant previous healthcare records.  {I obtained HPI from historian. {Patient discussed with supervising physician.  ED Course:  Assessment: Pt is a 81yF with hx HTN, On Eliquis for Mitral Regurg who presents with left hip pain s/p mechanical fall. On exam, pt in NAD. Nontoxic/nonseptic appearing. VSS. Afebrile. Lungs CTA. Heart RRR. Left hip with pain on ROM. NO visible or palpable deformities. Imaging showed subcapital fracture on left femoral neck. Minimal  displacement. Given analgesia in ED. Consult to Orthopedics (Dr. Mardelle Matte) requests transfer to [facility] to Pre Op. Will coordinate with medicine for admission. Plan is to [place].   Disposition/Plan:  Admit Pt acknowledges and agrees with plan  Supervising Physician Forde Dandy, MD  Final diagnoses:  Closed subcapital fracture of left femur, initial encounter [facility])    New Prescriptions New Prescriptions   No medications on file     Shary Decamp, PA-C 04/12/16 Oktaha Liu, MD 04/12/16 [phone]

## 2016-04-12 NOTE — Progress Notes (Signed)

## 2016-04-12 NOTE — Anesthesia Procedure Notes (Signed)
Procedure Name: Intubation Date/Time: 04/12/2016 9:02 PM Performed by: Manuela Schwartz B Pre-anesthesia Checklist: Patient identified, Emergency Drugs available, Suction available, Patient being monitored and Timeout performed Patient Re-evaluated:Patient Re-evaluated prior to inductionOxygen Delivery Method: Circle system utilized Preoxygenation: Pre-oxygenation with 100% oxygen Intubation Type: IV induction Ventilation: Mask ventilation without difficulty Laryngoscope Size: Mac and 3 Grade View: Grade I Tube type: Oral Tube size: 7.0 mm Number of attempts: 1 Airway Equipment and Method: Stylet Placement Confirmation: ETT inserted through vocal cords under direct vision,  positive ETCO2 and breath sounds checked- equal and bilateral Secured at: 21 cm Tube secured with: Tape Dental Injury: Teeth and Oropharynx as per pre-operative assessment

## 2016-04-12 NOTE — Consult Note (Signed)
ORTHOPAEDIC CONSULTATION  REQUESTING PHYSICIAN: No att. providers found  Chief Complaint: Left hip pain  HPI: Samantha Henderson is a 81 y.o. female who complains of  left hip pain after a mechanical fall. Pain is located laterally, worse with movement, better with rest. Described as an ache. She got tangled up in a blanket. Denies any other injuries.   Past Medical History:  Diagnosis Date  . Abdominal bruit    Abdominal bruit in the past no aortic aneurysm by evaluation in the past  . Aortic regurgitation    .mild...echo...04/2010  . Arm weakness    Arm weakness with exercise, January, 2012  . Bradycardia   . Constipation    Constipation with diltiazem March, 2012  . Diastolic dysfunction    Mild  . Dizziness   . Ejection fraction 2011   55-60% ejection fraction, echo, January, 2012  . HTN (hypertension)   . Hypothyroidism   . Leg cramps    from lipitor in the past  . Mitral regurgitation    echo..04/2007  /  mild..04/2010  . PAC (premature atrial contraction)   . Palpitation   . Pericardial effusion    small...posterior...echo..04/2007  . Polymyalgia ([place])   . Pulmonary hypertension   . Rheumatic fever    Questionable history of rheumatic fever, but no history of valvular abnormalities  . Sinus tachycardia   . Temporal arteritis (HCC)    Rule out temporal arteritis in the past  . Tricuspid regurgitation    -moderate...echo..04/2007...Marland KitchenMarland KitchenEF  60%..echo..04/2007  /   55-60%....echo..05/21/2010   Past Surgical History:  Procedure Laterality Date  . BACK SURGERY    . BUNIONECTOMY    . NECK SURGERY    . TOTAL ABDOMINAL HYSTERECTOMY     Social History   Social History  . Marital status: Married    Spouse name: N/A  . Number of children: N/A  . Years of education: N/A   Social History Main Topics  . Smoking status: Never Smoker  . Smokeless tobacco: Never Used  . Alcohol use None  . Drug use: Unknown  . Sexual activity: Not Asked   Other Topics Concern  . None    Social History Narrative  . None   Family History  Problem Relation Age of Onset  . Heart disease Mother    Allergies  Allergen Reactions  . Cephalexin     Rash  . Ciprofloxacin Swelling    Hives  Tongue Sweeling  . Lisinopril     Cougfh  . Sulfamethoxazole-Trimethoprim     Rash  . Sulfonamide Derivatives     Rash     Positive ROS: All other systems have been reviewed and were otherwise negative with the exception of those mentioned in the HPI and as above.  Physical Exam: General: Alert, no acute distress Cardiovascular: No pedal edema Respiratory: No cyanosis, no use of accessory musculature GI: No organomegaly, abdomen is soft and non-tender Skin: No lesions in the area of chief complaint Neurologic: Sensation intact distally Psychiatric: Patient is competent for consent with normal mood and affect Lymphatic: No axillary or cervical lymphadenopathy  MUSCULOSKELETAL: Left hip has positive log roll, leg lengths are equal, EHL and FHL are intact.  Assessment: Left valgus impacted femoral neck fracture  Plan: This is an acute severe injury that can require surgical fixation in order to stabilize her hip and get her back on her feet.  We'll plan for a cutaneous screw fixation.  The risks benefits and alternatives were discussed with  the patient including but not limited to the risks of nonoperative treatment, versus surgical intervention including infection, bleeding, nerve injury, malunion, nonunion, the need for revision surgery, hardware prominence, hardware failure, the need for hardware removal, blood clots, cardiopulmonary complications, morbidity, mortality, among others, and they were willing to proceed.  We have also discussed the potential for avascular necrosis, and the need for future conversion to hemiarthroplasty, however hopefully we will be able to achieve union. We will restart her eloquence postoperatively. I will also contact the hospitalist service  for admission.   Johnny Bridge, MD Cell [phone]   04/12/2016 8:50 PM

## 2016-04-12 NOTE — Anesthesia Preprocedure Evaluation (Addendum)
Anesthesia Evaluation  Patient identified by MRN, date of birth, ID band Patient awake    Reviewed: Allergy & Precautions, NPO status , Patient's Chart, lab work & pertinent test results  History of Anesthesia Complications Negative for: history of anesthetic complications  Airway Mallampati: I  TM Distance: >3 FB Neck ROM: Full    Dental  (+) Teeth Intact   Pulmonary neg pulmonary ROS,    breath sounds clear to auscultation       Cardiovascular hypertension, (-) angina+ Peripheral Vascular Disease  (-) Past MI + dysrhythmias Atrial Fibrillation + Valvular Problems/Murmurs MR  Rhythm:Regular     Neuro/Psych  Neuromuscular disease negative psych ROS   GI/Hepatic negative GI ROS, Neg liver ROS,   Endo/Other  Hypothyroidism   Renal/GU negative Renal ROS     Musculoskeletal Left hip fracture   Abdominal   Peds  Hematology   Anesthesia Other Findings   Reproductive/Obstetrics                            Anesthesia Physical Anesthesia Plan  ASA: II  Anesthesia Plan: General   Post-op Pain Management:    Induction: Intravenous  Airway Management Planned: Oral ETT  Additional Equipment: None  Intra-op Plan:   Post-operative Plan: Extubation in OR  Informed Consent: I have reviewed the patients History and Physical, chart, labs and discussed the procedure including the risks, benefits and alternatives for the proposed anesthesia with the patient or authorized representative who has indicated his/her understanding and acceptance.   Dental advisory given  Plan Discussed with: CRNA and Surgeon  Anesthesia Plan Comments:         Anesthesia Quick Evaluation

## 2016-04-12 NOTE — Op Note (Signed)
04/12/2016  9:51 PM  PATIENT:  Samantha Henderson    PRE-OPERATIVE DIAGNOSIS:  left valgus impacted femoral neck fracture  POST-OPERATIVE DIAGNOSIS:  Same  PROCEDURE:  CANNULATED HIP PINNING, left hip  SURGEON:  Johnny Bridge, MD  PHYSICIAN ASSISTANT: Joya Gaskins, OPA-C, present and scrubbed throughout the case, critical for completion in a timely fashion, and for retraction, instrumentation, and closure.  ANESTHESIA:   General  PREOPERATIVE INDICATIONS:  WILLOUGHBY GUBLER is a  81 y.o. female who fell and was found to have a diagnosis of left hip fracture who elected for surgical management.    The risks benefits and alternatives were discussed with the patient preoperatively including but not limited to the risks of infection, bleeding, nerve injury, cardiopulmonary complications, blood clots, malunion, nonunion, avascular necrosis, the need for revision surgery, the potential for conversion to hemiarthroplasty, among others, and the patient was willing to proceed.  OPERATIVE IMPLANTS: Zimmer 7.5 mm cannulated screws x3  OPERATIVE FINDINGS: Clinical osteoporosis with weak bone, proximal femur  OPERATIVE PROCEDURE: The patient was brought to the operating room and placed in supine position. IV antibiotics were given. General anesthesia administered. The patient was placed on the fracture table. The operative extremity was positioned, without any significant reduction maneuver and was prepped and draped in usual sterile fashion.  Time out was performed.  Small incision was made distal to the greater trochanter, and 3 guidewires were introduced Into an inverted triangle configuration. The lengths were measured. The reduction was slightly valgus, and near-anatomic. I opened the cortex with a cannulated drill, and then placed the screws into position. Satisfactory fixation was achieved.  The wounds were irrigated copiously, and repaired with Vicryl with Steri-Strips and sterile gauze. There no  complications and the patient tolerated the procedure well.  The patient will be weightbearing as tolerated, and will resume L Oquist on postoperative day one for DVT prophylaxis.

## 2016-04-12 NOTE — ED Notes (Signed)
Nurse is going to put an IV and collect labs

## 2016-04-12 NOTE — Anesthesia Postprocedure Evaluation (Signed)
Anesthesia Post Note  Patient: Samantha Henderson  Procedure(s) Performed: Procedure(s) (LRB): CANNULATED HIP PINNING (Left)  Patient location during evaluation: PACU Anesthesia Type: General Level of consciousness: awake Pain management: pain level controlled Vital Signs Assessment: post-procedure vital signs reviewed and stable Respiratory status: spontaneous breathing, nonlabored ventilation, respiratory function stable and patient connected to nasal cannula oxygen Cardiovascular status: blood pressure returned to baseline and stable Postop Assessment: no signs of nausea or vomiting Anesthetic complications: no       Last Vitals:  Vitals:   04/12/16 2245 04/12/16 2300  BP: 131/63 124/61  Pulse:    Resp:    Temp: 36.6 C     Last Pain:  Vitals:   04/12/16 2323  TempSrc:   PainSc: 0-No pain                 Tashiba Timoney

## 2016-04-13 ENCOUNTER — Encounter (HOSPITAL_COMMUNITY): Payer: Self-pay | Admitting: Internal Medicine

## 2016-04-13 DIAGNOSIS — S72012A Unspecified intracapsular fracture of left femur, initial encounter for closed fracture: Principal | ICD-10-CM

## 2016-04-13 LAB — CBC
HEMATOCRIT: 40.1 % (ref 36.0–46.0)
Hemoglobin: 13.4 g/dL (ref 12.0–15.0)
MCH: 29.7 pg (ref 26.0–34.0)
MCHC: 33.4 g/dL (ref 30.0–36.0)
MCV: 88.9 fL (ref 78.0–100.0)
Platelets: 87 10*3/uL — ABNORMAL LOW (ref 150–400)
RBC: 4.51 MIL/uL (ref 3.87–5.11)
RDW: 12.7 % (ref 11.5–15.5)
WBC: 9.5 10*3/uL (ref 4.0–10.5)

## 2016-04-13 LAB — BASIC METABOLIC PANEL
ANION GAP: 10 (ref 5–15)
BUN: 16 mg/dL (ref 6–20)
CHLORIDE: 100 mmol/L — AB (ref 101–111)
CO2: 28 mmol/L (ref 22–32)
Calcium: 9 mg/dL (ref 8.9–10.3)
Creatinine, Ser: 0.91 mg/dL (ref 0.44–1.00)
GFR, EST NON AFRICAN AMERICAN: 58 mL/min — AB (ref >60)
Glucose, Bld: 87 mg/dL (ref 65–99)
POTASSIUM: 3.4 mmol/L — AB (ref 3.5–5.1)
SODIUM: 138 mmol/L (ref 135–145)

## 2016-04-13 MED ORDER — SODIUM CHLORIDE 0.9% FLUSH
3.0000 mL | Freq: Two times a day (BID) | INTRAVENOUS | Status: DC
  Administered 2016-04-13 – 2016-04-15 (×6): 3 mL via INTRAVENOUS

## 2016-04-13 MED ORDER — POTASSIUM CHLORIDE CRYS ER 20 MEQ PO TBCR
40.0000 meq | EXTENDED_RELEASE_TABLET | Freq: Once | ORAL | Status: AC
  Administered 2016-04-13: 40 meq via ORAL
  Filled 2016-04-13: qty 2

## 2016-04-13 NOTE — H&P (Signed)
History and Physical    Samantha Henderson DOB: 17-Nov-1934 DOA: 04/12/2016  PCP: Gerrit Heck, MD  Patient coming from: home  Chief Complaint: fall  HPI: Samantha Henderson is a 81 y.o. female with a fall at home resulting in left hip fracture.  Pt was in her recliner when the phone rang.  She was wearing a blanket to keep warm.  She was getting up to answer the phone but she didn't realize the blanket was wrapped around her feet.  No chest pain, palpitations, sob, cough, hemoptysis, nausea, vomiting, fever, chills, abdominal pain, dysuria, hematuria, polyuria, nocturia, BRBPR, melena, hematochezia, heat/cold intolerance, orthopnea, PND, leg swelling, LOC or head trauma.  She went to the OR for Left hip repair by Ortho.  They recommended for NOAC to resume on POD1    ED Course: Noted for hip fracture and went to OR  Review of Systems: As per HPI otherwise 10 point review of systems negative.   Past Medical History:  Diagnosis Date  . Abdominal bruit    Abdominal bruit in the past no aortic aneurysm by evaluation in the past  . Aortic regurgitation    .mild...echo...04/2010  . Arm weakness    Arm weakness with exercise, January, 2012  . Bradycardia   . Constipation    Constipation with diltiazem March, 2012  . Diastolic dysfunction    Mild  . Dizziness   . Ejection fraction 2011   55-60% ejection fraction, echo, January, 2012  . HTN (hypertension)   . Hypothyroidism   . Leg cramps    from lipitor in the past  . Mitral regurgitation    echo..04/2007  /  mild..04/2010  . PAC (premature atrial contraction)   . Palpitation   . Pericardial effusion    small...posterior...echo..04/2007  . Polymyalgia ([place])   . Pulmonary hypertension   . Rheumatic fever    Questionable history of rheumatic fever, but no history of valvular abnormalities  . Sinus tachycardia   . Temporal arteritis (HCC)    Rule out temporal arteritis in the past  . Tricuspid regurgitation    -moderate...echo..04/2007...Marland KitchenMarland KitchenEF  60%..echo..04/2007  /   55-60%....echo..05/21/2010    Past Surgical History:  Procedure Laterality Date  . BACK SURGERY    . BUNIONECTOMY    . NECK SURGERY    . TOTAL ABDOMINAL HYSTERECTOMY       reports that she has never smoked. She has never used smokeless tobacco. She reports that she does not drink alcohol or use drugs.  Allergies  Allergen Reactions  . Cephalexin     Rash  . Ciprofloxacin Swelling    Hives  Tongue Sweeling  . Lisinopril     Cougfh  . Sulfamethoxazole-Trimethoprim     Rash  . Sulfonamide Derivatives     Rash    Family History  Problem Relation Age of Onset  . Heart disease Mother   . Stroke Mother    Family hx reviewed  Prior to Admission medications   Medication Sig Start Date End Date Taking? Authorizing Provider  acetaminophen (TYLENOL) 325 MG tablet Take 650 mg by mouth 2 (two) times daily. For pain    Historical Provider, MD  atorvastatin (LIPITOR) 20 MG tablet Take 20 mg by mouth daily.      Historical Provider, MD  BIOTIN PO Take 500 mg by mouth daily.    Historical Provider, MD  calcium carbonate (TUMS) 500 MG chewable tablet Chew 1 tablet by mouth daily.  Historical Provider, MD  Cholecalciferol (VITAMIN D3) 1000 UNITS CAPS Take 1 capsule by mouth daily. 1 tab po qd    Historical Provider, MD  diltiazem (CARDIZEM CD) 120 MG 24 hr capsule Take 1 capsule (120 mg total) by mouth daily. Please call and schedule a 6 month follow up appointment per last office visit 10/17/15   Sueanne Margarita, MD  ELIQUIS 5 MG TABS tablet Take 1 tablet by mouth two  times daily 05/07/15   Sueanne Margarita, MD  fish oil-omega-3 fatty acids 1000 MG capsule Take 1 g by mouth daily.      Historical Provider, MD  hydrochlorothiazide 25 MG tablet Take 25 mg by mouth daily.      Historical Provider, MD  hydroxychloroquine (PLAQUENIL) 200 MG tablet Take 400 mg by mouth daily.  08/09/14   Historical Provider, MD  levothyroxine (SYNTHROID,  LEVOTHROID) 112 MCG tablet Take 112 mcg by mouth daily before breakfast.    Historical Provider, MD  predniSONE (DELTASONE) 1 MG tablet Take 2 mg by mouth 2 (two) times daily with a meal.    Historical Provider, MD  sennosides-docusate sodium (SENOKOT-S) 8.6-50 MG tablet Take 2 tablets by mouth daily. 04/12/16   Marchia Bond, MD  traMADol (ULTRAM) 50 MG tablet Take 1 tablet (50 mg total) by mouth every 6 (six) hours as needed. 04/12/16   Marchia Bond, MD  vitamin E (VITAMIN E) 1000 UNIT capsule Take 1,000 Units by mouth daily.    Historical Provider, MD    Physical Exam: Vitals:   04/12/16 2245 04/12/16 2300 04/12/16 2339 04/12/16 2351  BP: 131/63 124/61 (!) 127/59   Pulse:   79   Resp:      Temp: 97.9 F (36.6 C)  97.7 F (36.5 C)   TempSrc:   Axillary   SpO2:    97%      Constitutional: NAD, calm, comfortable Vitals:   04/12/16 2245 04/12/16 2300 04/12/16 2339 04/12/16 2351  BP: 131/63 124/61 (!) 127/59   Pulse:   79   Resp:      Temp: 97.9 F (36.6 C)  97.7 F (36.5 C)   TempSrc:   Axillary   SpO2:    97%   Eyes: PERRL, lids and conjunctivae normal, anicteric ENMT: Mucous membranes are moist. Posterior pharynx clear of any exudate or lesions.Normal dentition.  Neck: normal, supple, no masses, no thyromegaly Respiratory: clear to auscultation bilaterally, no wheezing, no crackles. Normal respiratory effort. No accessory muscle use.  Cardiovascular: Regular rate and rhythm, no murmurs / rubs / gallops. No extremity edema. 2+ pedal pulses. No carotid bruits.  Abdomen: no tenderness, no masses palpated. No hepatosplenomegaly. Bowel sounds positive.  Musculoskeletal: no clubbing / cyanosis. No joint deformity upper and lower extremities. Good ROM, no contractures. Normal muscle tone.  Skin: no rashes, lesions, ulcers. No induration Neurologic: CN 2-12 grossly intact. Sensation intact, DTR normal. Strength 5/5 in all 4. Except left leg due to recent repair could not fully  assess. Psychiatric: Normal judgment and insight. Alert and oriented x 3. Normal mood.  Capillary refill less than 2 seconds   Labs on Admission: I have personally reviewed following labs and imaging studies  CBC:  Recent Labs Lab 04/12/16 1819  WBC 14.0*  HGB 14.2  HCT 41.9  MCV 88.0  PLT [id number]*   Basic Metabolic Panel:  Recent Labs Lab 04/12/16 1819  NA 138  K 3.0*  CL 103  CO2 26  GLUCOSE 89  BUN 27*  CREATININE 0.94  CALCIUM 8.9   GFR: CrCl cannot be calculated (Unknown ideal weight.). Liver Function Tests: No results for input(s): AST, ALT, ALKPHOS, BILITOT, PROT, ALBUMIN in the last 168 hours. No results for input(s): LIPASE, AMYLASE in the last 168 hours. No results for input(s): AMMONIA in the last 168 hours. Coagulation Profile:  Recent Labs Lab 04/12/16 1819  INR 1.23   Cardiac Enzymes: No results for input(s): CKTOTAL, CKMB, CKMBINDEX, TROPONINI in the last 168 hours. BNP (last 3 results) No results for input(s): PROBNP in the last 8760 hours. HbA1C: No results for input(s): HGBA1C in the last 72 hours. CBG: No results for input(s): GLUCAP in the last 168 hours. Lipid Profile: No results for input(s): CHOL, HDL, LDLCALC, TRIG, CHOLHDL, LDLDIRECT in the last 72 hours. Thyroid Function Tests: No results for input(s): TSH, T4TOTAL, FREET4, T3FREE, THYROIDAB in the last 72 hours. Anemia Panel: No results for input(s): VITAMINB12, FOLATE, FERRITIN, TIBC, IRON, RETICCTPCT in the last 72 hours. Urine analysis:    Component Value Date/Time   COLORURINE AMBER BIOCHEMICALS MAY BE AFFECTED BY COLOR (A) 06/15/2007 2117   APPEARANCEUR CLEAR 06/15/2007 2117   LABSPEC >1.046 (H) 06/15/2007 2117   PHURINE 6.0 06/15/2007 2117   GLUCOSEU NEGATIVE 06/15/2007 2117   HGBUR NEGATIVE 06/15/2007 2117   BILIRUBINUR SMALL (A) 06/15/2007 2117   KETONESUR TRACE (A) 06/15/2007 2117   PROTEINUR 100 (A) 06/15/2007 2117   UROBILINOGEN 0.2 06/15/2007 2117   NITRITE  NEGATIVE 06/15/2007 2117   LEUKOCYTESUR NEGATIVE 06/15/2007 2117    Sepsis Labs: No results found for this or any previous visit (from the past 240 hour(s)).   Radiological Exams on Admission: Pelvis Portable  Result Date: 04/12/2016 CLINICAL DATA:  81 year old female status post ORIF of the left hip. EXAM: PORTABLE PELVIS 1-2 VIEWS COMPARISON:  Left hip radiograph dated 04/12/2016 FINDINGS: There has been interval placement of 3 fixation screws through the left femoral neck fracture. The fixation screws appear intact. No new fracture identified. The bones are osteopenic. There is no dislocation. There is mild-to-moderate osteoarthritic changes of the hips. Lower lumbar degenerative changes. The soft tissues appear unremarkable. IMPRESSION: Interval fixation of previously seen left femoral neck fracture and placement of 3 fixation screws. Electronically Signed   By: Anner Crete M.D.   On: 04/12/2016 23:15   Dg Hip Unilat With Pelvis 2-3 Views Left  Result Date: 04/12/2016 CLINICAL DATA:  LEFT hip pain, tripped over a blanket while getting out of chair this evening, unable to rise from lying on x-ray table post imaging, hip pain post fall EXAM: DG HIP (WITH OR WITHOUT PELVIS) 2-3V LEFT COMPARISON:  None FINDINGS: Diffuse osseous demineralization. Hip and SI joint spaces symmetric and preserved. Subcapital fracture of the LEFT femoral neck, minimally displaced. No dislocation. No additional fracture, dislocation or bone destruction. IMPRESSION: Minimally displaced subcapital fracture LEFT femoral neck. Osseous demineralization. Electronically Signed   By: Lavonia Dana M.D.   On: 04/12/2016 17:35    EKG: by my independent review. NSR 63  Assessment/Plan Principal Problem:   Closed subcapital fracture of left femur (HCC) Active Problems:   Hypothyroidism   Polymyalgia rheumatica (HCC)   Temporal arteritis (HCC)   HTN (hypertension)   Pulmonary hypertension   Diastolic dysfunction    Paroxysmal atrial fibrillation (HCC)   Left displaced femoral neck fracture (HCC)    Let hip repair: plan per Ortho, POD 0.  Ok to resume NOAC POD1, order already placed by Ortho.  Pain control.  Monitor  HTN: continue meds, monitor, optimize care and Rx  Hypothyroidism: stable, monitor, ck labs, cont meds  PMR/Temporal arteritis: stable, chronic, cont c steroids  Pulm HTN: stable, chronic, monitor  Diastolic Dysfx: stable chronic, monitor.  Caution c IVF  PAF: on NOAC, cont rate control, stable, monitor   DVT prophylaxis: NOAC  Code Status: Full  Family Communication: none  Disposition Plan: NH  Consults called: Ortho Dr Mardelle Matte  Admission status: Full, 2 midnights for the above related condition.    Carmon Ginsberg MD [facility] Pager [phone]  If 7PM-7AM, please contact night-coverage [email or web address] Password [email or web address]  04/13/2016, 1:10 AM

## 2016-04-13 NOTE — Progress Notes (Signed)
SPORTS MEDICINE AND JOINT REPLACEMENT  Lara Mulch, MD    Carlyon Shadow, PA-C [place], [place],   [address]                             [phone]   PROGRESS NOTE  Subjective:  negative for Chest Pain  negative for Shortness of Breath  negative for Nausea/Vomiting   negative for Calf Pain  negative for Bowel Movement   Tolerating Diet: yes         Patient reports pain as 5 on 0-10 scale.    Objective: Vital signs in last 24 hours:   Patient Vitals for the past 24 hrs:  BP Temp Temp src Pulse Resp SpO2  04/13/16 1016 (!) 102/40 - - - - -  04/13/16 0448 (!) 117/51 98.5 F (36.9 C) Oral (!) 56 16 96 %  04/12/16 2351 - - - - - 97 %  04/12/16 2339 (!) 127/59 97.7 F (36.5 C) Axillary 79 - -  04/12/16 2300 124/61 - - - - -  04/12/16 2245 131/63 97.9 F (36.6 C) - - - -  04/12/16 2230 (!) 142/60 - - - - -  04/12/16 2215 136/66 - - - - -  04/12/16 2202 (!) 143/68 97.9 F (36.6 C) - 73 12 99 %  04/12/16 1930 - - - (!) 59 16 92 %  04/12/16 1907 130/73 - - 68 22 98 %  04/12/16 1621 131/71 98.6 F (37 C) Oral 69 18 98 %    @flow {1959:LAST@   Intake/Output from previous day:   12/23 0701 - 12/24 0700 In: 1475 [P.O.:75; I.V.:1400] Out: 1420 [Urine:1400]   Intake/Output this shift:   12/24 0701 - 12/24 1900 In: 3 [I.V.:3] Out: -    Intake/Output      12/23 0701 - 12/24 0700 12/24 0701 - 12/25 0700   P.O. 75    I.V. 1400 3   Total Intake 1475 3   Urine 1400    Blood 20    Total Output 1420     Net +55 +3           LABORATORY DATA:  Recent Labs  04/12/16 1819 04/13/16 0541  WBC 14.0* 9.5  HGB 14.2 13.4  HCT 41.9 40.1  PLT 132* 87*    Recent Labs  04/12/16 1819 04/13/16 0541  NA 138 138  K 3.0* 3.4*  CL 103 100*  CO2 26 28  BUN 27* 16  CREATININE 0.94 0.91  GLUCOSE 89 87  CALCIUM 8.9 9.0   Lab Results  Component Value Date   INR 1.23 04/12/2016    Examination:  General appearance: alert, cooperative and no  distress Extremities: extremities normal, atraumatic, no cyanosis or edema  Wound Exam: clean, dry, intact   Drainage:  None: wound tissue dry  Motor Exam: Quadriceps and Hamstrings Intact  Sensory Exam: Superficial Peroneal, Deep Peroneal and Tibial normal   Assessment:    1 Day Post-Op  Procedure(s) (LRB): CANNULATED HIP PINNING (Left)  ADDITIONAL DIAGNOSIS:  Principal Problem:   Closed subcapital fracture of left femur (HCC) Active Problems:   Hypothyroidism   Polymyalgia rheumatica (HCC)   Temporal arteritis (HCC)   HTN (hypertension)   Pulmonary hypertension   Diastolic dysfunction   Paroxysmal atrial fibrillation (HCC)   Left displaced femoral neck fracture (HCC)     Plan: Physical Therapy as ordered Weight Bearing as Tolerated (WBAT)  DVT Prophylaxis:  Eloquis  DISCHARGE PLAN: Home  DISCHARGE NEEDS: HHPT   Patient doing well. Continue to follow until medicine D/Cs         Donia Ast 04/13/2016, 12:41 PM

## 2016-04-13 NOTE — Discharge Instructions (Signed)
Diet: As you were doing prior to hospitalization   Shower:  May shower but keep the wounds dry, use an occlusive plastic wrap, NO SOAKING IN TUB.  If the bandage gets wet, change with a clean dry gauze.  If you have a splint on, leave the splint in place and keep the splint dry with a plastic bag.  Dressing:  You may change your dressing 3-5 days after surgery, unless you have a splint.  If you have a splint, then just leave the splint in place and we will change your bandages during your first follow-up appointment.    If you had hand or foot surgery, we will plan to remove your stitches in about 2 weeks in the office.  For all other surgeries, there are sticky tapes (steri-strips) on your wounds and all the stitches are absorbable.  Leave the steri-strips in place when changing your dressings, they will peel off with time, usually 2-3 weeks.  Activity:  Increase activity slowly as tolerated, but follow the weight bearing instructions below.  The rules on driving is that you can not be taking narcotics while you drive, and you must feel in control of the vehicle.    Weight Bearing:   As tolerated.    To prevent constipation: you may use a stool softener such as -  Colace (over the counter) 100 mg by mouth twice a day  Drink plenty of fluids (prune juice may be helpful) and high fiber foods Miralax (over the counter) for constipation as needed.    Itching:  If you experience itching with your medications, try taking only a single pain pill, or even half a pain pill at a time.  You may take up to 10 pain pills per day, and you can also use benadryl over the counter for itching or also to help with sleep.   Precautions:  If you experience chest pain or shortness of breath - call 911 immediately for transfer to the hospital emergency department!!  If you develop a fever greater that 101 F, purulent drainage from wound, increased redness or drainage from wound, or calf pain -- Call the office at  [phone]                                                Follow- Up Appointment:  Please call for an appointment to be seen in 2 weeks [place] - [phone]  Information on my medicine - ELIQUIS (apixaban)  This medication education was reviewed with me or my healthcare representative as part of my discharge preparation.  The pharmacist that spoke with me during my hospital stay was:  Brain Hilts, [facility]  Why was Eliquis prescribed for you? Eliquis was prescribed for you to reduce the risk of a blood clot forming that can cause a stroke if you have a medical condition called atrial fibrillation (a type of irregular heartbeat).  What do You need to know about Eliquis ? Take your Eliquis TWICE DAILY - one tablet in the morning and one tablet in the evening with or without food. If you have difficulty swallowing the tablet whole please discuss with your pharmacist how to take the medication safely.  Take Eliquis exactly as prescribed by your doctor and DO NOT stop taking Eliquis without talking to the doctor who prescribed the medication.  Stopping may increase your risk  of developing a stroke.  Refill your prescription before you run out.  After discharge, you should have regular check-up appointments with your healthcare provider that is prescribing your Eliquis.  In the future your dose may need to be changed if your kidney function or weight changes by a significant amount or as you get older.  What do you do if you miss a dose? If you miss a dose, take it as soon as you remember on the same day and resume taking twice daily.  Do not take more than one dose of ELIQUIS at the same time to make up a missed dose.  Important Safety Information A possible side effect of Eliquis is bleeding. You should call your healthcare provider right away if you experience any of the following: ? Bleeding from an injury or your nose that does not stop. ? Unusual colored urine (red or  dark brown) or unusual colored stools (red or black). ? Unusual bruising for unknown reasons. ? A serious fall or if you hit your head (even if there is no bleeding).  Some medicines may interact with Eliquis and might increase your risk of bleeding or clotting while on Eliquis. To help avoid this, consult your healthcare provider or pharmacist prior to using any new prescription or non-prescription medications, including herbals, vitamins, non-steroidal anti-inflammatory drugs (NSAIDs) and supplements.  This website has more information on Eliquis (apixaban): http://www.eliquis.com/eliquis/home

## 2016-04-13 NOTE — Progress Notes (Signed)
OT Cancellation Note  Patient Details Name: MORGYN MELUCCI MRN: [id number] DOB: 07/06/1934   Cancelled Treatment:    Reason Eval/Treat Not Completed: Pain limiting ability to participate;Fatigue/lethargy limiting ability to participate   Betsy Pries 04/13/2016, 11:40 AM

## 2016-04-13 NOTE — Care Management Note (Signed)
81 yo F with a L hip x after falling at home. S/p cannulated hip pinning.   Received referral to assist with HHPT and DME.  PT is recommending SNF.  SW to assist pt with SNF placement.

## 2016-04-13 NOTE — Evaluation (Signed)
Physical Therapy Evaluation Patient Details Name: Samantha Henderson MRN: [id number] DOB: 10/04/1934 Today's Date: 04/13/2016   History of Present Illness  81 yo female admitted on 04/12/16 following fall resulting in left subcapital fracture requiring hip pinning. PMH significant for HTN, bradycardia, EF 55-60%.   Clinical Impression  Pt is POD 1 following the above procedure. Prior to admission, pt was living alone in single level townhouse doing for herself without any assistance. Pt has below deficits which limit her ability to return home alone. Pt's daughter works and would not be available to assist if she returned home. Pt will benefit from admission to SNF for short term rehab in order to improve strength and mobility prior to returning home. Pt will benefit from being seen acutely in order to address below deficits in order to assist with transition to next venue of care.     Follow Up Recommendations SNF    Equipment Recommendations  Rolling walker with 5" wheels;3in1 (PT)    Recommendations for Other Services       Precautions / Restrictions Precautions Precautions: Fall Restrictions Weight Bearing Restrictions: Yes LLE Weight Bearing: Weight bearing as tolerated      Mobility  Bed Mobility Overal bed mobility: Needs Assistance Bed Mobility: Supine to Sit;Sit to Supine     Supine to sit: Mod assist;HOB elevated Sit to supine: Mod assist   General bed mobility comments: Mod A to bring trunk upright from supine position and Mod A to bring Le's back into bed.   Transfers Overall transfer level: Needs assistance Equipment used: Rolling walker (2 wheeled) Transfers: Sit to/from Stand Sit to Stand: Mod assist         General transfer comment: Mod A from bed to RW initially, Min A from recliner to RW   Ambulation/Gait Ambulation/Gait assistance: Min assist Ambulation Distance (Feet): 10 Feet (5x2) Assistive device: Rolling walker (2 wheeled) Gait  Pattern/deviations: Shuffle;Trunk flexed;Decreased weight shift to left;Decreased stance time - left;Decreased step length - right Gait velocity: decreased Gait velocity interpretation: Below normal speed for age/gender General Gait Details: Pt unable to weight shift to LLE to step with RLE and shuffels feet in order to maneuver them from bed to chair and back.   Stairs            Wheelchair Mobility    Modified Rankin (Stroke Patients Only)       Balance Overall balance assessment: Needs assistance Sitting-balance support: Single extremity supported Sitting balance-Leahy Scale: Fair Sitting balance - Comments: supervision for safety at EOB   Standing balance support: Bilateral upper extremity supported Standing balance-Leahy Scale: Poor Standing balance comment: relies on RW for support in standing                             Pertinent Vitals/Pain Pain Assessment: 0-10 Pain Score: 9  Pain Location: left hip Pain Descriptors / Indicators: Throbbing Pain Intervention(s): Limited activity within patient's tolerance;Monitored during session;Premedicated before session;Ice applied;Repositioned    Home Living Family/patient expects to be discharged to:: Private residence Living Arrangements: Alone Available Help at Discharge: Family;Available PRN/intermittently Type of Home: Other(Comment) (townhouse) Home Access: Level entry     Home Layout: One level Home Equipment: Walker - 4 wheels      Prior Function Level of Independence: Independent         Comments: running errands, driving, performing housework and independent with adls     Hand Dominance   Dominant Hand:  Right    Extremity/Trunk Assessment   Upper Extremity Assessment Upper Extremity Assessment: Defer to OT evaluation    Lower Extremity Assessment Lower Extremity Assessment: LLE deficits/detail LLE Deficits / Details: pt with normal post op pain and weakness at least 3/5 ankle and  2/5 knee and hip per gross functional assessment.        Communication   Communication: No difficulties  Cognition Arousal/Alertness: Awake/alert Behavior During Therapy: WFL for tasks assessed/performed Overall Cognitive Status: Within Functional Limits for tasks assessed                      General Comments General comments (skin integrity, edema, etc.): pt is hesistant to perform OOB transfers and wants to immediately return to Bed after getting to recliner    Exercises     Assessment/Plan    PT Assessment Patient needs continued PT services  PT Problem List Decreased strength;Decreased range of motion;Decreased activity tolerance;Decreased balance;Decreased mobility;Decreased knowledge of use of DME;Pain          PT Treatment Interventions DME instruction;Gait training;Functional mobility training;Therapeutic activities;Therapeutic exercise    PT Goals (Current goals can be found in the Care Plan section)  Acute Rehab PT Goals Patient Stated Goal: to get back to walking again PT Goal Formulation: With patient Time For Goal Achievement: 04/20/16 Potential to Achieve Goals: Good    Frequency Min 3X/week   Barriers to discharge        Co-evaluation               End of Session Equipment Utilized During Treatment: Gait belt Activity Tolerance: Patient limited by fatigue;Patient limited by pain Patient left: in bed;with call bell/phone within reach;with family/visitor present;with SCD's reapplied Nurse Communication: Mobility status;Patient requests pain meds         Time: 1020-1058 PT Time Calculation (min) (ACUTE ONLY): 38 min   Charges:   PT Evaluation $PT Eval Moderate Complexity: 1 Procedure PT Treatments $Gait Training: 8-22 mins $Therapeutic Activity: 8-22 mins   PT G Codes:        Scheryl Marten PT, DPT  [phone]  04/13/2016, 12:22 PM

## 2016-04-13 NOTE — Progress Notes (Addendum)
Progress note  Patient admitted early this morning. See H&P. Admitted for left hip fx, underwent surgery with ortho. She has no new complaints this morning. Has some hip and knee pain, but no other chest pain, shortness of breath, abdominal pain.   Pain control Replace K  PT/OT   Dessa Phi, DO [facility] [email or web address] Password [facility] 04/13/2016, 9:41 AM

## 2016-04-14 ENCOUNTER — Other Ambulatory Visit: Payer: Self-pay | Admitting: Cardiology

## 2016-04-14 LAB — BASIC METABOLIC PANEL
ANION GAP: 3 — AB (ref 5–15)
BUN: 14 mg/dL (ref 6–20)
CALCIUM: 8.4 mg/dL — AB (ref 8.9–10.3)
CO2: 32 mmol/L (ref 22–32)
Chloride: 101 mmol/L (ref 101–111)
Creatinine, Ser: 0.86 mg/dL (ref 0.44–1.00)
GFR calc Af Amer: >60 mL/min (ref >60)
GFR calc non Af Amer: >60 mL/min (ref >60)
GLUCOSE: 94 mg/dL (ref 65–99)
Potassium: 4 mmol/L (ref 3.5–5.1)
Sodium: 136 mmol/L (ref 135–145)

## 2016-04-14 MED ORDER — MORPHINE SULFATE (PF) 2 MG/ML IV SOLN
2.0000 mg | INTRAVENOUS | Status: DC | PRN
  Administered 2016-04-14 – 2016-04-16 (×4): 2 mg via INTRAVENOUS
  Filled 2016-04-14 (×4): qty 1

## 2016-04-14 NOTE — NC FL2 (Signed)
[place] LEVEL OF CARE SCREENING TOOL     IDENTIFICATION  Patient Name: Samantha Henderson Birthdate: 02/21/1935 Sex: female Admission Date (Current Location): 04/12/2016  [facility] and [place] Number:  Herbalist and Address:  [facility] [place], [place] [address], [place], [place] [address]      Provider Number: [id number]  Attending Physician Name and Address:  Shon Millet*  Relative Name and Phone Number:       Current Level of Care: Hospital Recommended Level of Care: [place] Prior Approval Number:    Date Approved/Denied:   PASRR Number: [id number]  Discharge Plan: SNF    Current Diagnoses: Patient Active Problem List   Diagnosis Date Noted  . Closed subcapital fracture of left femur ([place]) 04/12/2016  . Left displaced femoral neck fracture ([place]) 04/12/2016  . Joint pain 03/20/2014  . Hepatic cyst 12/06/2013  . Paroxysmal atrial fibrillation ([place]) 11/23/2013  . Pericardial effusion   . Temporal arteritis ([place])   . PAC (premature atrial contraction)   . Sinus tachycardia   . Palpitation   . HTN (hypertension)   . Leg cramps   . Rheumatic fever   . Aortic regurgitation   . Mitral regurgitation   . Tricuspid regurgitation   . Ejection fraction   . Dizziness   . Pulmonary hypertension   . Diastolic dysfunction   . Abdominal bruit   . Bradycardia   . Constipation   . Arm weakness   . Hypothyroidism 08/01/2008  . Polymyalgia rheumatica ([place]) 08/01/2008    Orientation RESPIRATION BLADDER Height & Weight     Self, Time, Situation, Place  Normal Continent Weight:   Height:     BEHAVIORAL SYMPTOMS/MOOD NEUROLOGICAL BOWEL NUTRITION STATUS      Continent Diet (Please see DC Summary)  AMBULATORY STATUS COMMUNICATION OF NEEDS Skin   Extensive Assist Verbally Surgical wounds (Closed incision on hip)                       Personal Care Assistance Level of Assistance  Bathing, Feeding,  Dressing Bathing Assistance: Maximum assistance Feeding assistance: Independent Dressing Assistance: Limited assistance     Functional Limitations Info             SPECIAL CARE FACTORS FREQUENCY  PT (By licensed PT)     PT Frequency: 5x/week              Contractures      Additional Factors Info  Code Status, Allergies Code Status Info: Full Allergies Info: Cephalexin, Ciprofloxacin, Lisinopril, Sulfamethoxazole-trimethoprim, Sulfonamide Derivatives           Current Medications (04/14/2016):  This is the current hospital active medication list Current Facility-Administered Medications  Medication Dose Route Frequency Provider Last Rate Last Dose  . acetaminophen (TYLENOL) tablet 650 mg  650 mg Oral Q6H PRN Marchia Bond, MD   650 mg at 04/13/16 1756  . alum & mag hydroxide-simeth (MAALOX/MYLANTA) 200-200-20 MG/5ML suspension 30 mL  30 mL Oral Q4H PRN Marchia Bond, MD      . apixaban Arne Cleveland) tablet 5 mg  5 mg Oral BID Marchia Bond, MD   5 mg at 04/14/16 0835  . atorvastatin (LIPITOR) tablet 20 mg  20 mg Oral Daily Marchia Bond, MD   20 mg at 04/14/16 0835  . bisacodyl (DULCOLAX) suppository 10 mg  10 mg Rectal Daily PRN Marchia Bond, MD      . calcium carbonate (TUMS -  dosed in mg elemental calcium) chewable tablet 200 mg of elemental calcium  1 tablet Oral QAC supper Marchia Bond, MD   200 mg of elemental calcium at 04/13/16 1600  . cholecalciferol (VITAMIN D) tablet 1,000 Units  1,000 Units Oral Daily Marchia Bond, MD   1,000 Units at 04/14/16 [phone]  . diltiazem (CARDIZEM CD) 24 hr capsule 120 mg  120 mg Oral Daily Marchia Bond, MD      . docusate sodium (COLACE) capsule 100 mg  100 mg Oral BID Marchia Bond, MD   100 mg at 04/14/16 0835  . ferrous sulfate tablet 325 mg  325 mg Oral TID PC Marchia Bond, MD   325 mg at 04/14/16 [id number]  . hydrochlorothiazide (HYDRODIURIL) tablet 25 mg  25 mg Oral Daily Marchia Bond, MD      . HYDROcodone-acetaminophen  (NORCO/VICODIN) 5-325 MG per tablet 1-2 tablet  1-2 tablet Oral Q6H PRN Marchia Bond, MD   2 tablet at 04/14/16 0304  . hydroxychloroquine (PLAQUENIL) tablet 400 mg  400 mg Oral Daily Marchia Bond, MD   400 mg at 04/14/16 0836  . levothyroxine (SYNTHROID, LEVOTHROID) tablet 112 mcg  112 mcg Oral QAC breakfast Marchia Bond, MD   112 mcg at 04/14/16 0640  . menthol-cetylpyridinium (CEPACOL) lozenge 3 mg  1 lozenge Oral PRN Marchia Bond, MD       Or  . phenol (CHLORASEPTIC) mouth spray 1 spray  1 spray Mouth/Throat PRN Marchia Bond, MD      . morphine 2 MG/ML injection 2 mg  2 mg Intravenous Q2H PRN Marchia Bond, MD   2 mg at 04/14/16 0830  . omega-3 acid ethyl esters (LOVAZA) capsule 1 g  1 g Oral Daily Marchia Bond, MD   1 g at 04/13/16 1011  . ondansetron (ZOFRAN) tablet 4 mg  4 mg Oral Q6H PRN Marchia Bond, MD       Or  . ondansetron [facility]) injection 4 mg  4 mg Intravenous Q6H PRN Marchia Bond, MD      . polyethylene glycol (MIRALAX / GLYCOLAX) packet 17 g  17 g Oral Daily PRN Marchia Bond, MD      . predniSONE (DELTASONE) tablet 2 mg  2 mg Oral BID WC Marchia Bond, MD   2 mg at 04/14/16 0836  . senna (SENOKOT) tablet 8.6 mg  1 tablet Oral BID Marchia Bond, MD   8.6 mg at 04/14/16 0835  . sodium chloride flush (NS) 0.9 % injection 3 mL  3 mL Intravenous Q12H Carmon Ginsberg, MD   3 mL at 04/14/16 0837  . vitamin E capsule 800 Units  800 Units Oral Daily Marchia Bond, MD   800 Units at 04/14/16 [id number]     Discharge Medications: Please see discharge summary for a list of discharge medications.  Relevant Imaging Results:  Relevant Lab Results:   Additional Information SSN: [place]se [place], [place]

## 2016-04-14 NOTE — Progress Notes (Signed)
OT Cancellation Note  Patient Details Name: Samantha Henderson MRN: [id number] DOB: Dec 11, 1934   Cancelled Treatment:    Reason Eval/Treat Not Completed: Other (comment) Pt's current D/C plan is SNF. No apparent immediate acute care OT needs, therefore will defer OT to SNF. If OT eval is needed please call Acute Rehab Dept. at [phone] or text page OT at [phone].   [place], OT/L  [id number] 04/14/2016 04/14/2016, 2:44 PM

## 2016-04-14 NOTE — Progress Notes (Signed)
SPORTS MEDICINE AND JOINT REPLACEMENT  Lara Mulch, MD    Carlyon Shadow, PA-C [place], [place], [place]  [address]                             [phone]   PROGRESS NOTE  Subjective:  negative for Chest Pain  negative for Shortness of Breath  negative for Nausea/Vomiting   negative for Calf Pain  negative for Bowel Movement   Tolerating Diet: yes         Patient reports pain as 4 on 0-10 scale.    Objective: Vital signs in last 24 hours:   Patient Vitals for the past 24 hrs:  BP Temp Temp src Pulse Resp SpO2  04/14/16 0408 (!) 108/49 97.9 F (36.6 C) Oral (!) 50 16 96 %  04/13/16 1940 (!) 106/44 98.9 F (37.2 C) Oral (!) 53 16 94 %  04/13/16 1744 (!) 115/41 100.2 F (37.9 C) - 64 18 100 %  04/13/16 1433 (!) 113/49 99.1 F (37.3 C) - 64 16 98 %  04/13/16 1016 (!) 102/40 - - - - -    @flow {1959:LAST@   Intake/Output from previous day:   12/24 0701 - 12/25 0700 In: 748 [P.O.:240; I.V.:508] Out: -    Intake/Output this shift:   No intake/output data recorded.   Intake/Output      12/24 0701 - 12/25 0700 12/25 0701 - 12/26 0700   P.O. 240    I.V. 508    Total Intake 748     Urine     Blood     Total Output       Net +748          Urine Occurrence 4 x       LABORATORY DATA:  Recent Labs  04/12/16 1819 04/13/16 0541  WBC 14.0* 9.5  HGB 14.2 13.4  HCT 41.9 40.1  PLT 132* 87*    Recent Labs  04/12/16 1819 04/13/16 0541 04/14/16 0456  NA 138 138 136  K 3.0* 3.4* 4.0  CL 103 100* 101  CO2 26 28 32  BUN 27* 16 14  CREATININE 0.94 0.91 0.86  GLUCOSE 89 87 94  CALCIUM 8.9 9.0 8.4*   Lab Results  Component Value Date   INR 1.23 04/12/2016    Examination:  General appearance: alert, cooperative and no distress Extremities: extremities normal, atraumatic, no cyanosis or edema  Wound Exam: clean, dry, intact   Drainage:  None: wound tissue dry  Motor Exam: Quadriceps and Hamstrings Intact  Sensory Exam: Superficial  Peroneal, Deep Peroneal and Tibial normal   Assessment:    2 Days Post-Op  Procedure(s) (LRB): CANNULATED HIP PINNING (Left)  ADDITIONAL DIAGNOSIS:  Principal Problem:   Closed subcapital fracture of left femur (HCC) Active Problems:   Hypothyroidism   Polymyalgia rheumatica (HCC)   Temporal arteritis (HCC)   HTN (hypertension)   Pulmonary hypertension   Diastolic dysfunction   Paroxysmal atrial fibrillation (HCC)   Left displaced femoral neck fracture (HCC)     Plan: Physical Therapy as ordered Weight Bearing as Tolerated (WBAT)  DVT Prophylaxis:  Eloquis  DISCHARGE PLAN: Home  DISCHARGE NEEDS: HHPT   Patient doing very well from an ortho standpoint. Continue to progress and PT. Continue plans per medicine         Donia Ast 04/14/2016, 7:55 AM

## 2016-04-14 NOTE — Progress Notes (Signed)
PROGRESS NOTE    Samantha Henderson  [id number] DOB: [birth year] DOA: 04/12/2016 PCP: Gerrit Heck, MD     Brief Narrative:  Samantha Henderson is a 81 y.o. female with a fall at home resulting in left hip fracture. She underwent repair with ortho on 12/23.   Assessment & Plan:   Principal Problem:   Closed subcapital fracture of left femur (HCC) Active Problems:   Hypothyroidism   Polymyalgia rheumatica (HCC)   Temporal arteritis (HCC)   HTN (hypertension)   Pulmonary hypertension   Diastolic dysfunction   Paroxysmal atrial fibrillation (HCC)   Left displaced femoral neck fracture (HCC)  Left hip fracture after fall -OR for repair on 12/23 -PT/OT recommending SNF   HTN -Continue HCTZ  Hypothyroidism -Continue synthroid   PMR/temporal arteritis -Continue prednisone, plaquenil   Chronic diastolic CHF -Stable   Paroxysmal A Fib -Continue eliquis, cardizem  -Currently in sinus   HLD -Continue lipitor    DVT prophylaxis: eliquis Code Status: full Family Communication: no family at bedside Disposition Plan: SNF   Consultants:   Ortho  Procedures:   OR for left hip fx repair 12/23  Antimicrobials:   None     Subjective: Feeling well this morning. States that her left hip pain is slowly improving. Has no other complaints. No fevers overnight, denies any chest pain or shortness of breath. Tolerating meals.  Objective: Vitals:   04/13/16 1744 04/13/16 1940 04/14/16 0408 04/14/16 0833  BP: (!) 115/41 (!) 106/44 (!) 108/49 (!) 112/48  Pulse: 64 (!) 53 (!) 50 (!) 53  Resp: 18 16 16    Temp: 100.2 F (37.9 C) 98.9 F (37.2 C) 97.9 F (36.6 C)   TempSrc:  Oral Oral   SpO2: 100% 94% 96%     Intake/Output Summary (Last 24 hours) at 04/14/16 1058 Last data filed at 04/14/16 [id number]  Gross per 24 hour  Intake              748 ml  Output              100 ml  Net              648 ml   There were no vitals filed for this visit.  Examination:    General exam: Appears calm and comfortable  Respiratory system: Clear to auscultation. Respiratory effort normal. Cardiovascular system: S1 & S2 heard, RRR. No JVD, murmurs, rubs, gallops or clicks. No pedal edema. Gastrointestinal system: Abdomen is nondistended, soft and nontender. No organomegaly or masses felt. Normal bowel sounds heard. Central nervous system: Alert and oriented. No focal neurological deficits. Extremities: Symmetric 5 x 5 power. Skin: No rashes, lesions or ulcers Psychiatry: Judgement and insight appear normal. Mood & affect appropriate.   Data Reviewed: I have personally reviewed following labs and imaging studies  CBC:  Recent Labs Lab 04/12/16 1819 04/13/16 0541  WBC 14.0* 9.5  HGB 14.2 13.4  HCT 41.9 40.1  MCV 88.0 88.9  PLT 132* 87*   Basic Metabolic Panel:  Recent Labs Lab 04/12/16 1819 04/13/16 0541 04/14/16 0456  NA 138 138 136  K 3.0* 3.4* 4.0  CL 103 100* 101  CO2 26 28 32  GLUCOSE 89 87 94  BUN 27* 16 14  CREATININE 0.94 0.91 0.86  CALCIUM 8.9 9.0 8.4*   GFR: CrCl cannot be calculated (Unknown ideal weight.). Liver Function Tests: No results for input(s): AST, ALT, ALKPHOS, BILITOT, PROT, ALBUMIN in the last 168 hours. No results for  input(s): LIPASE, AMYLASE in the last 168 hours. No results for input(s): AMMONIA in the last 168 hours. Coagulation Profile:  Recent Labs Lab 04/12/16 1819  INR 1.23   Cardiac Enzymes: No results for input(s): CKTOTAL, CKMB, CKMBINDEX, TROPONINI in the last 168 hours. BNP (last 3 results) No results for input(s): PROBNP in the last 8760 hours. HbA1C: No results for input(s): HGBA1C in the last 72 hours. CBG: No results for input(s): GLUCAP in the last 168 hours. Lipid Profile: No results for input(s): CHOL, HDL, LDLCALC, TRIG, CHOLHDL, LDLDIRECT in the last 72 hours. Thyroid Function Tests: No results for input(s): TSH, T4TOTAL, FREET4, T3FREE, THYROIDAB in the last 72 hours. Anemia  Panel: No results for input(s): VITAMINB12, FOLATE, FERRITIN, TIBC, IRON, RETICCTPCT in the last 72 hours. Sepsis Labs: No results for input(s): PROCALCITON, LATICACIDVEN in the last 168 hours.  No results found for this or any previous visit (from the past 240 hour(s)).     Radiology Studies: Pelvis Portable  Result Date: 04/12/2016 CLINICAL DATA:  81 year old female status post ORIF of the left hip. EXAM: PORTABLE PELVIS 1-2 VIEWS COMPARISON:  Left hip radiograph dated 04/12/2016 FINDINGS: There has been interval placement of 3 fixation screws through the left femoral neck fracture. The fixation screws appear intact. No new fracture identified. The bones are osteopenic. There is no dislocation. There is mild-to-moderate osteoarthritic changes of the hips. Lower lumbar degenerative changes. The soft tissues appear unremarkable. IMPRESSION: Interval fixation of previously seen left femoral neck fracture and placement of 3 fixation screws. Electronically Signed   By: Anner Crete M.D.   On: 04/12/2016 23:15   Dg C-arm 1-60 Min  Result Date: 04/13/2016 CLINICAL DATA:  Left hip fracture EXAM: DG C-ARM 61-120 MIN; OPERATIVE LEFT HIP WITH PELVIS COMPARISON:  Left hip radiograph 04/12/2016 FINDINGS: Two fluoroscopic images during internal fixation of left hip fracture are provided. Fluoroscopy time was reported as 64 seconds. There are 3 lag screws traversing the femoral neck. IMPRESSION: Intraoperative fluoroscopy during internal fixation of left hip fracture. Electronically Signed   By: Ulyses Jarred M.D.   On: 04/13/2016 03:47   Dg Hip Operative Unilat W Or W/o Pelvis Left  Result Date: 04/13/2016 CLINICAL DATA:  Left hip fracture EXAM: DG C-ARM 61-120 MIN; OPERATIVE LEFT HIP WITH PELVIS COMPARISON:  Left hip radiograph 04/12/2016 FINDINGS: Two fluoroscopic images during internal fixation of left hip fracture are provided. Fluoroscopy time was reported as 64 seconds. There are 3 lag screws  traversing the femoral neck. IMPRESSION: Intraoperative fluoroscopy during internal fixation of left hip fracture. Electronically Signed   By: Ulyses Jarred M.D.   On: 04/13/2016 03:47   Dg Hip Unilat With Pelvis 2-3 Views Left  Result Date: 04/12/2016 CLINICAL DATA:  LEFT hip pain, tripped over a blanket while getting out of chair this evening, unable to rise from lying on x-ray table post imaging, hip pain post fall EXAM: DG HIP (WITH OR WITHOUT PELVIS) 2-3V LEFT COMPARISON:  None FINDINGS: Diffuse osseous demineralization. Hip and SI joint spaces symmetric and preserved. Subcapital fracture of the LEFT femoral neck, minimally displaced. No dislocation. No additional fracture, dislocation or bone destruction. IMPRESSION: Minimally displaced subcapital fracture LEFT femoral neck. Osseous demineralization. Electronically Signed   By: Lavonia Dana M.D.   On: 04/12/2016 17:35      Scheduled Meds: . apixaban  5 mg Oral BID  . atorvastatin  20 mg Oral Daily  . calcium carbonate  1 tablet Oral QAC supper  . cholecalciferol  1,000 Units Oral Daily  . diltiazem  120 mg Oral Daily  . docusate sodium  100 mg Oral BID  . ferrous sulfate  325 mg Oral TID PC  . hydrochlorothiazide  25 mg Oral Daily  . hydroxychloroquine  400 mg Oral Daily  . levothyroxine  112 mcg Oral QAC breakfast  . omega-3 acid ethyl esters  1 g Oral Daily  . predniSONE  2 mg Oral BID WC  . senna  1 tablet Oral BID  . sodium chloride flush  3 mL Intravenous Q12H  . vitamin E  800 Units Oral Daily   Continuous Infusions:   LOS: 2 days    Time spent: 30 minutes   Dessa Phi, DO [facility] [email or web address] Password [facility] 04/14/2016, 10:58 AM

## 2016-04-15 ENCOUNTER — Encounter (HOSPITAL_COMMUNITY): Payer: Self-pay

## 2016-04-15 MED ORDER — TRAMADOL HCL 50 MG PO TABS: 50.0000 mg | ORAL_TABLET | Freq: Four times a day (QID) | ORAL | 0 refills | Status: DC | PRN

## 2016-04-15 MED ORDER — HYDROCODONE-ACETAMINOPHEN 5-325 MG PO TABS: 1.0000 | ORAL_TABLET | Freq: Four times a day (QID) | ORAL | 0 refills | Status: DC | PRN

## 2016-04-15 NOTE — Progress Notes (Signed)
Physical Therapy Treatment Patient Details Name: Samantha Henderson MRN: [id number] DOB: 1935-04-03 Today's Date: 04/15/2016    History of Present Illness 81 yo female admitted on 04/12/16 following fall resulting in left subcapital fracture requiring hip pinning. PMH significant for HTN, bradycardia, EF 55-60%.     PT Comments    Pt demonstrates improved mobility this session to EOB and short distance gait to recliner. Improved weight bearing through LLE noted this session and improved step height. Initiated supine exercises in order to improve hip mobility prior to mobility.   Follow Up Recommendations  SNF     Equipment Recommendations  Rolling walker with 5" wheels;3in1 (PT)    Recommendations for Other Services       Precautions / Restrictions Precautions Precautions: Fall Restrictions Weight Bearing Restrictions: Yes LLE Weight Bearing: Weight bearing as tolerated    Mobility  Bed Mobility Overal bed mobility: Needs Assistance Bed Mobility: Supine to Sit     Supine to sit: Min assist     General bed mobility comments: Min A to bring trunk upright into sitting. Pt leaing to left due to pain on LLE  Transfers Overall transfer level: Needs assistance Equipment used: Rolling walker (2 wheeled) Transfers: Sit to/from Stand Sit to Stand: Min assist         General transfer comment: Min A from EOB to RW  Ambulation/Gait Ambulation/Gait assistance: Min assist Ambulation Distance (Feet): 15 Feet Assistive device: Rolling walker (2 wheeled) Gait Pattern/deviations: Shuffle;Trunk flexed;Decreased weight shift to left;Decreased stance time - left;Decreased step length - right Gait velocity: decreased Gait velocity interpretation: Below normal speed for age/gender General Gait Details: pt with improved toe clearance on LLE, improved step length but continues to be limited with gait distance   Stairs            Wheelchair Mobility    Modified Rankin (Stroke  Patients Only)       Balance Overall balance assessment: Needs assistance Sitting-balance support: Single extremity supported Sitting balance-Leahy Scale: Fair Sitting balance - Comments: supervision for safety at EOB Postural control: Right lateral lean Standing balance support: Bilateral upper extremity supported Standing balance-Leahy Scale: Poor Standing balance comment: relies on RW for support in standing                    Cognition Arousal/Alertness: Awake/alert Behavior During Therapy: WFL for tasks assessed/performed Overall Cognitive Status: Within Functional Limits for tasks assessed                      Exercises Total Joint Exercises Ankle Circles/Pumps: AROM;Both;20 reps;Supine Quad Sets: AROM;Left;10 reps;Supine Heel Slides: AAROM;Left;10 reps;Supine    General Comments        Pertinent Vitals/Pain Pain Assessment: 0-10 Pain Score: 4  Pain Location: left hip Pain Descriptors / Indicators: Throbbing Pain Intervention(s): Limited activity within patient's tolerance;Monitored during session;Premedicated before session    Home Living                      Prior Function            PT Goals (current goals can now be found in the care plan section) Acute Rehab PT Goals Patient Stated Goal: to get back to walking again Progress towards PT goals: Progressing toward goals    Frequency    Min 3X/week      PT Plan Current plan remains appropriate    Co-evaluation  End of Session Equipment Utilized During Treatment: Gait belt Activity Tolerance: Patient limited by pain Patient left: in chair;with call bell/phone within reach     Time: 0849-0919 PT Time Calculation (min) (ACUTE ONLY): 30 min  Charges:  $Gait Training: 8-22 mins $Therapeutic Exercise: 8-22 mins                    G Codes:      Scheryl Marten PT, DPT  [phone]  04/15/2016, 12:27 PM

## 2016-04-15 NOTE — Clinical Social Work Note (Signed)
Pt reports her husband passed away in 03/09/23. This is the only contact on her chart. Pt provided CSW with her daughters contact. Pt's daughter lives in [place] and assist in her medical decisions/needs. Pt daughter name is Noralee Chars (c) [phone], [phone].  [address] [place]
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
## 2016-04-15 NOTE — Clinical Social Work Placement (Signed)
   CLINICAL SOCIAL WORK PLACEMENT  NOTE  Date:  04/15/2016  Patient Details  Name: Samantha Henderson MRN: [id number] Date of Birth: 04-Mar-1935  Clinical Social Work is seeking post-discharge placement for this patient at the [place] level of care (*CSW will initial, date and re-position this form in  chart as items are completed):      Patient/family provided with [place] Work Department's list of facilities offering this level of care within the geographic area requested by the patient (or if unable, by the patient's family).      Patient/family informed of their freedom to choose among providers that offer the needed level of care, that participate in Medicare, Medicaid or managed care program needed by the patient, have an available bed and are willing to accept the patient.      Patient/family informed of [facility]'s ownership interest in [facility] and [facility], as well as of the fact that they are under no obligation to receive care at these facilities.  PASRR submitted to EDS on 04/14/16     PASRR number received on 04/14/16     Existing PASRR number confirmed on       FL2 transmitted to all facilities in geographic area requested by pt/family on 04/14/16     FL2 transmitted to all facilities within larger geographic area on       Patient informed that his/her managed care company has contracts with or will negotiate with certain facilities, including the following:        Yes   Patient/family informed of bed offers received.  Patient chooses bed at [facility]     Physician recommends and patient chooses bed at      Patient to be transferred to [facility] on 04/16/16.  Patient to be transferred to facility by [facility]     Patient family notified on   of transfer.  Name of family member notified:        PHYSICIAN Please sign FL2, Please prepare prescriptions, Please prepare priority discharge summary, including  medications     Additional Comment:    _______________________________________________ Alla German, LCSW 04/15/2016, 10:11 AM

## 2016-04-15 NOTE — Progress Notes (Signed)
SLP Cancellation Note  Patient Details Name: Samantha Henderson MRN: [id number] DOB: [birth year]   Cancelled treatment:       Reason Eval/Treat Not Completed: SLP screened, no needs identified, will sign off. Swallow eval ordered on 12/24 due to pt having difficulty swallow a pill. Checked in with pt today, she reports she has been tolerating PO and pills since then and does not feel she needs a swallow eval at this time. Briefly discussed taking pills safely. Will defer evaluation per pt request.    Savahanna Almendariz, Katherene Ponto 04/15/2016, 9:18 AM

## 2016-04-15 NOTE — Progress Notes (Addendum)
     Subjective:  Patient reports pain as mild.  No complaints.  Objective:   VITALS:   Vitals:   04/14/16 0833 04/14/16 2107 04/15/16 0453 04/15/16 1500  BP: (!) 112/48 (!) 124/46 (!) 111/53 (!) 116/48  Pulse: (!) 53 (!) 48 (!) 50 (!) 56  Resp:  16 16 16   Temp:  98.1 F (36.7 C) 97.9 F (36.6 C) 97.5 F (36.4 C)  TempSrc:  Oral Oral Oral  SpO2:  91% 95% 98%    Neurologically intact Dorsiflexion/Plantar flexion intact Incision: dressing C/D/I   Lab Results  Component Value Date   WBC 9.5 04/13/2016   HGB 13.4 04/13/2016   HCT 40.1 04/13/2016   MCV 88.9 04/13/2016   PLT 87 (L) 04/13/2016   BMET    Component Value Date/Time   NA 136 04/14/2016 0456   K 4.0 04/14/2016 0456   CL 101 04/14/2016 0456   CO2 32 04/14/2016 0456   GLUCOSE 94 04/14/2016 0456   BUN 14 04/14/2016 0456   CREATININE 0.86 04/14/2016 0456   CREATININE 1.17 (H) 05/01/2015 1425   CALCIUM 8.4 (L) 04/14/2016 0456   GFRNONAA >60 04/14/2016 0456   GFRAA >60 04/14/2016 0456     Assessment/Plan: 3 Days Post-Op   Principal Problem:   Closed subcapital fracture of left femur (HCC) Active Problems:   Hypothyroidism   Polymyalgia rheumatica (HCC)   Temporal arteritis (HCC)   HTN (hypertension)   Pulmonary hypertension   Diastolic dysfunction   Paroxysmal atrial fibrillation (HCC)   Left displaced femoral neck fracture (HCC)   Advance diet Up with therapy Discharge to SNF  Anticoagulation for 3 weeks from my standpoint, however she has been on eliquis long-term, and can continue with this for long-term., return to clinic with me in 2 weeks, weightbearing as tolerated.  Doing well, planning snf, please call with additional questions.   Lucie Friedlander P 04/15/2016, 4:14 PM   Marchia Bond, MD Cell [phone]

## 2016-04-15 NOTE — Clinical Social Work Note (Signed)
Pt has chosen [facility] at d/c. Per Ivin Booty at [facility] pt will have a bed available on 12/27. MD and RN notified. CSW will set up transportation on 12/27.  [address], [place]

## 2016-04-15 NOTE — Clinical Social Work Note (Signed)
Clinical Social Work Assessment  Patient Details  Name: Samantha Henderson MRN: [id number] Date of Birth: March 17, 1935  Date of referral:  04/15/16               Reason for consult:  Facility Placement                Permission sought to share information with:  Family Supports Permission granted to share information::     Name::        Agency::     Relationship::     Contact Information:     Housing/Transportation Living arrangements for the past 2 months:  Single Family Home Source of Information:  Patient Patient Interpreter Needed:  None Criminal Activity/Legal Involvement Pertinent to Current Situation/Hospitalization:  No - Comment as needed Significant Relationships:  Adult Children Lives with:  Self Do you feel safe going back to the place where you live?  Yes Need for family participation in patient care:     Care giving concerns:  No family or friends at bedside at this time. Pt reports her family should be coming to the hospital soon.    Social Worker assessment / plan:  CSW spoke with pt at bedside. Pt is alert and oriented.  Pt is agreeable to SNF placement at this time. CSW presented bed offers. Pt prefers to go to [facility]. CSW will reach out to facility.   Employment status:  Retired Nurse, adult PT Recommendations:  [place] / Referral to community resources:  [place]  Patient/Family's Response to care:  Pt verbalized understanding of CSW role and appreciation of support. Pt denies any concern regarding pt care at this time.  Patient/Family's Understanding of and Emotional Response to Diagnosis, Current Treatment, and Prognosis:  Pt understanding and realistic regarding physical limitations. Pt is agreeable to SNF placement at this time. Pt denies any concern regarding treatment plan at this time. CSW will continue to provide support.  Emotional Assessment Appearance:  Appears stated  age Attitude/Demeanor/Rapport:   (Patient was appropriate.) Affect (typically observed):  Accepting, Appropriate Orientation:  Oriented to Self, Oriented to Place, Oriented to Situation, Oriented to  Time Alcohol / Substance use:  Not Applicable Psych involvement (Current and /or in the community):  No (Comment)  Discharge Needs  Concerns to be addressed:  No discharge needs identified Readmission within the last 30 days:  No Current discharge risk:  Dependent with Mobility Barriers to Discharge:  Continued Medical Work up   [employer], LCSW 04/15/2016, 10:08 AM

## 2016-04-16 DIAGNOSIS — S72012D Unspecified intracapsular fracture of left femur, subsequent encounter for closed fracture with routine healing: Secondary | ICD-10-CM

## 2016-04-16 MED FILL — Hydromorphone HCl Inj 2 MG/ML: INTRAMUSCULAR | Qty: 1 | Status: AC

## 2016-04-16 NOTE — Care Management Important Message (Signed)
Important Message  Patient Details  Name: AUBRA WALCH MRN: [id number] Date of Birth: 09-Aug-1934   Medicare Important Message Given:  Yes    Iviana Blasingame 04/16/2016, 11:22 AM

## 2016-04-16 NOTE — Progress Notes (Signed)
Pt to be transported to [facility] today. Report called to facility. IV removed from left [facility] without difficulty. Site WNL. Pt demonstates no s/sx of distress or c/o. Pt will transfer via [facility]

## 2016-04-16 NOTE — Progress Notes (Signed)
Called to pt room as she was discharging to [facility]. Pt states that the "lady from that place I'm going" says I need to go over my medicine with you as there are medicines that I don't want to continue taking.  Explained to pt that I have no authority to make medication changes in her plan of care and that she can discuss her meds with the house MD at [facility] or her ortho physician when she returns for her follow up visit.  Explained that her medication list has been faxed to [facility] at this time and she has the right to decline any medication that is presented to her if she so chooses. Pt would not make comment or eye contact.

## 2016-04-16 NOTE — Plan of Care (Signed)
Problem: Activity: Goal: Risk for activity intolerance will decrease Outcome: Progressing Pt was assisted to the [facility] ,tolerated well   Problem: Pain Management: Goal: Pain level will decrease Outcome: Progressing Medicated for pain as needed with full relief ;pt resting on the bed; no further complaints of pain

## 2016-04-16 NOTE — Plan of Care (Signed)
Problem: Safety: Goal: Ability to remain free from injury will improve Outcome: Progressing Safety precautions and fall preventions maintained, no fall or injuries noted this shift  Problem: Physical Regulation: Goal: Will remain free from infection Outcome: Progressing No S/S of infection noted, VS WDL  Problem: Tissue Perfusion: Goal: Risk factors for ineffective tissue perfusion will decrease Outcome: Progressing Denies S/S of DVT  Problem: Bowel/Gastric: Goal: Will not experience complications related to bowel motility Outcome: Progressing Denies gastric and bowel issues, no c/o or nausea or vomitting

## 2016-04-16 NOTE — Progress Notes (Signed)
Patient will DC to: [place] Anticipated DC date: 04/16/16 Family notified: Pt alerting family Transport by: Corey Harold (may be an hour and half behind)   Per MD patient ready for DC to [facility]. RN, patient, patient's family, and facility notified of DC. Discharge Summary sent to facility. RN given number for report. DC packet on chart. Ambulance transport requested for patient.   CSW signing off.  Cedric Fishman, [place] Social Worker [phone]

## 2016-04-16 NOTE — Care Management Note (Signed)
Case Management Note  Patient Details  Name: RYLEI GIASSON MRN: [id number] Date of Birth: 1934-08-22  Subjective/Objective: 81 y.o. F admitted 04/12/2016 L Hip Fx. Anticipate discharge to [facility] today.                    Action/Plan:CM will sign off for now but will be available should additional discharge needs arise or disposition change.    Expected Discharge Date:                  Expected Discharge Plan:  Skilled Nursing Facility  In-House Referral:  Clinical Social Work  Discharge planning Services  CM Consult  Post Acute Care Choice:  NA Choice offered to:  Patient  DME Arranged:  N/A DME Agency:  NA  HH Arranged:  NA HH Agency:  NA  Status of Service:  Completed, signed off  If discussed at [place] of Stay Meetings, dates discussed:    Additional Comments:  Delrae Sawyers, RN 04/16/2016, 9:47 AM

## 2016-04-16 NOTE — Clinical Social Work Placement (Signed)
   CLINICAL SOCIAL WORK PLACEMENT  NOTE  Date:  04/16/2016  Patient Details  Name: Samantha Henderson MRN: [id number] Date of Birth: 1934/05/03  Clinical Social Work is seeking post-discharge placement for this patient at the [place] level of care (*CSW will initial, date and re-position this form in  chart as items are completed):  Yes   Patient/family provided with [place] Work Department's list of facilities offering this level of care within the geographic area requested by the patient (or if unable, by the patient's family).  Yes   Patient/family informed of their freedom to choose among providers that offer the needed level of care, that participate in Medicare, Medicaid or managed care program needed by the patient, have an available bed and are willing to accept the patient.  Yes   Patient/family informed of [facility]'s ownership interest in [facility] and [facility], as well as of the fact that they are under no obligation to receive care at these facilities.  PASRR submitted to EDS on 04/14/16     PASRR number received on 04/14/16     Existing PASRR number confirmed on       FL2 transmitted to all facilities in geographic area requested by pt/family on 04/14/16     FL2 transmitted to all facilities within larger geographic area on       Patient informed that his/her managed care company has contracts with or will negotiate with certain facilities, including the following:        Yes   Patient/family informed of bed offers received.  Patient chooses bed at [facility]     Physician recommends and patient chooses bed at      Patient to be transferred to [facility] on 04/16/16.  Patient to be transferred to facility by [facility]     Patient family notified on 04/16/16 of transfer.  Name of family member notified:  N/A     PHYSICIAN Please sign FL2, Please prepare prescriptions, Please prepare priority discharge summary,  including medications     Additional Comment:    _______________________________________________ Benard Halsted, LCSWA 04/16/2016, 2:39 PM

## 2016-04-16 NOTE — Progress Notes (Signed)
Pt seen and examined, stable for discharge to SNF today, no changes from DC summary 12/26  Domenic Polite, MD

## 2016-04-17 ENCOUNTER — Encounter: Payer: Self-pay | Admitting: Internal Medicine

## 2016-04-17 ENCOUNTER — Non-Acute Institutional Stay (SKILLED_NURSING_FACILITY): Payer: Medicare Other | Admitting: Internal Medicine

## 2016-04-17 DIAGNOSIS — I10 Essential (primary) hypertension: Secondary | ICD-10-CM

## 2016-04-17 DIAGNOSIS — S72002S Fracture of unspecified part of neck of left femur, sequela: Secondary | ICD-10-CM | POA: Diagnosis not present

## 2016-04-17 DIAGNOSIS — K5900 Constipation, unspecified: Secondary | ICD-10-CM | POA: Diagnosis not present

## 2016-04-17 DIAGNOSIS — I480 Paroxysmal atrial fibrillation: Secondary | ICD-10-CM | POA: Diagnosis not present

## 2016-04-17 DIAGNOSIS — D696 Thrombocytopenia, unspecified: Secondary | ICD-10-CM | POA: Diagnosis not present

## 2016-04-17 DIAGNOSIS — R2681 Unsteadiness on feet: Secondary | ICD-10-CM

## 2016-04-17 DIAGNOSIS — M316 Other giant cell arteritis: Secondary | ICD-10-CM

## 2016-04-17 DIAGNOSIS — M353 Polymyalgia rheumatica: Secondary | ICD-10-CM

## 2016-04-17 DIAGNOSIS — E039 Hypothyroidism, unspecified: Secondary | ICD-10-CM | POA: Diagnosis not present

## 2016-04-17 NOTE — Progress Notes (Signed)
LOCATION: [facility]  PCP: Gerrit Heck, MD   Code Status: Full Code  Goals of care: Advanced Directive information Advanced Directives 04/12/2016  Does Patient Have a Medical Advance Directive? No  Would patient like information on creating a medical advance directive? No - Patient declined       Extended Emergency Contact Information Primary Emergency Contact: Eoff,Jack Address: [address],  [place] Phone: [phone] Relation: Spouse   Allergies  Allergen Reactions  . Cephalexin     Rash  . Ciprofloxacin Swelling    Hives  Tongue Sweeling  . Lisinopril     Cougfh  . Sulfamethoxazole-Trimethoprim     Rash  . Sulfonamide Derivatives     Rash    Chief Complaint  Patient presents with  . New Admit To SNF    New Admission Visit      HPI:  Patient is a 81 y.o. female seen today for short term rehabilitation post hospital admission from [facility]-27th of December 2017 with left hip closed fracture post fall. She underwent surgical repair on 04/12/2016. She has medical history of polymyalgia rheumatica, hypertension, temporal arteritis, atrial fibrillation, hypothyroidism among others. She is seen in her room today.  Review of Systems:  Constitutional: Negative for fever, chills, diaphoresis.  HENT: Negative for headache, congestion, nasal discharge. She occasionally feels like food is getting stuck in her chest.    Eyes: Negative for blurred vision, double vision and discharge.  Respiratory: Negative for cough, shortness of breath and wheezing.   Cardiovascular: Negative for chest pain, palpitation.  Gastrointestinal: Negative for heartburn,vomiting, abdominal pain. Positive for nausea and poor appetite. Last bowel movement was yesterday. Passing gas.   Genitourinary: Negative for dysuria and flank pain.  Musculoskeletal: Negative for back pain, fall in the facility.  pain medication has been  helpful. Skin: Negative for itching, rash.  Neurological: Negative for dizziness. Psychiatric/Behavioral: Negative for depression   Past Medical History:  Diagnosis Date  . Abdominal bruit    Abdominal bruit in the past no aortic aneurysm by evaluation in the past  . Aortic regurgitation    .mild...echo...04/2010  . Arm weakness    Arm weakness with exercise, January, 2012  . Bradycardia   . Constipation    Constipation with diltiazem March, 2012  . Diastolic dysfunction    Mild  . Dizziness   . Ejection fraction 2011   55-60% ejection fraction, echo, January, 2012  . HTN (hypertension)   . Hypothyroidism   . Leg cramps    from lipitor in the past  . Mitral regurgitation    echo..04/2007  /  mild..04/2010  . PAC (premature atrial contraction)   . Palpitation   . Pericardial effusion    small...posterior...echo..04/2007  . Polymyalgia ([place])   . Pulmonary hypertension   . Rheumatic fever    Questionable history of rheumatic fever, but no history of valvular abnormalities  . Sinus tachycardia   . Temporal arteritis (HCC)    Rule out temporal arteritis in the past  . Tricuspid regurgitation    -moderate...echo..04/2007...Marland KitchenMarland KitchenEF  60%..echo..04/2007  /   55-60%....echo..05/21/2010   Past Surgical History:  Procedure Laterality Date  . BACK SURGERY    . BUNIONECTOMY    . HIP PINNING,CANNULATED Left 04/12/2016   Procedure: CANNULATED HIP PINNING;  Surgeon: Marchia Bond, MD;  Location: [facility];  Service: Orthopedics;  Laterality: Left;  . NECK SURGERY    .  TOTAL ABDOMINAL HYSTERECTOMY     Social History:   reports that she has never smoked. She has never used smokeless tobacco. She reports that she does not drink alcohol or use drugs.  Family History  Problem Relation Age of Onset  . Heart disease Mother   . Stroke Mother     Medications: Allergies as of 04/17/2016      Reactions   Cephalexin    Rash   Ciprofloxacin Swelling   Hives  Tongue Sweeling   Lisinopril     Cougfh   Sulfamethoxazole-trimethoprim    Rash   Sulfonamide Derivatives    Rash      Medication List       Accurate as of 04/17/16 11:03 AM. Always use your most recent med list.          acetaminophen 325 MG tablet Commonly known as:  TYLENOL Take 650 mg by mouth 2 (two) times daily. For pain   atorvastatin 20 MG tablet Commonly known as:  LIPITOR Take 20 mg by mouth daily.   BIOTIN PO Take 500 mg by mouth daily.   CARTIA XT 120 MG 24 hr capsule Generic drug:  diltiazem TAKE 1 CAPSULE BY MOUTH  DAILY.   ELIQUIS 5 MG Tabs tablet Generic drug:  apixaban Take 1 tablet by mouth two  times daily   fish oil-omega-3 fatty acids 1000 MG capsule Take 1 g by mouth daily.   hydrochlorothiazide 25 MG tablet Commonly known as:  HYDRODIURIL Take 25 mg by mouth daily.   HYDROcodone-acetaminophen 5-325 MG tablet Commonly known as:  NORCO/VICODIN Take 1-2 tablets by mouth every 6 (six) hours as needed for moderate pain.   levothyroxine 112 MCG tablet Commonly known as:  SYNTHROID, LEVOTHROID Take 112 mcg by mouth daily before breakfast.   predniSONE 1 MG tablet Commonly known as:  DELTASONE Take 3 mg by mouth daily with breakfast.   sennosides-docusate sodium 8.6-50 MG tablet Commonly known as:  SENOKOT-S Take 2 tablets by mouth daily.   traMADol 50 MG tablet Commonly known as:  ULTRAM Take 1 tablet (50 mg total) by mouth every 6 (six) hours as needed.   TUMS 500 MG chewable tablet Generic drug:  calcium carbonate Chew 1 tablet by mouth daily.   Vitamin D3 1000 units Caps Take 1 capsule by mouth daily. 1 tab po qd   vitamin E 1000 UNIT capsule Generic drug:  vitamin E Take 1,000 Units by mouth daily.       Immunizations:  There is no immunization history on file for this patient.   Physical Exam: Vitals:   04/17/16 1057  BP: 123/60  Pulse: 67  Resp: 18  SpO2: 94%  Weight: 131 lb 9.6 oz (59.7 kg)  Height: 5' 6.5" (1.689 m)   Body mass index is  20.92 kg/m.  General- elderly female, Thin built, in no acute distress Head- normocephalic, atraumatic Nose- no maxillary or frontal sinus tenderness, no nasal discharge Throat- moist mucus membrane  Eyes- PERRLA, EOMI, no pallor, no icterus, no discharge, normal conjunctiva, normal sclera Neck- no cervical lymphadenopathy Cardiovascular- irregular heart rate, no murmur Respiratory- bilateral clear to auscultation, no wheeze, no rhonchi, no crackles, no use of accessory muscles Abdomen- bowel sounds present, soft, non tender Musculoskeletal- able to move all 4 extremities, limited left leg range of motion, no leg edema Neurological- alert and oriented to person, place and time Skin- warm and dry, left hip surgical incision site with Mepilex dressing, clean and dry, bruise to  left arm  Psychiatry- normal mood and affect    Labs reviewed: Basic Metabolic Panel:  Recent Labs  04/12/16 1819 04/13/16 0541 04/14/16 0456  NA 138 138 136  K 3.0* 3.4* 4.0  CL 103 100* 101  CO2 26 28 32  GLUCOSE 89 87 94  BUN 27* 16 14  CREATININE 0.94 0.91 0.86  CALCIUM 8.9 9.0 8.4*   Liver Function Tests: No results for input(s): AST, ALT, ALKPHOS, BILITOT, PROT, ALBUMIN in the last 8760 hours. No results for input(s): LIPASE, AMYLASE in the last 8760 hours. No results for input(s): AMMONIA in the last 8760 hours. CBC:  Recent Labs  05/01/15 1425 04/12/16 1819 04/13/16 0541  WBC 7.3 14.0* 9.5  NEUTROABS 5.6  --   --   HGB 13.3 14.2 13.4  HCT 38.8 41.9 40.1  MCV 86.0 88.0 88.9  PLT 144* 132* 87*   Cardiac Enzymes: No results for input(s): CKTOTAL, CKMB, CKMBINDEX, TROPONINI in the last 8760 hours. BNP: Invalid input(s): POCBNP CBG: No results for input(s): GLUCAP in the last 8760 hours.  Radiological Exams: Pelvis Portable  Result Date: 04/12/2016 CLINICAL DATA:  81 year old female status post ORIF of the left hip. EXAM: PORTABLE PELVIS 1-2 VIEWS COMPARISON:  Left hip radiograph  dated 04/12/2016 FINDINGS: There has been interval placement of 3 fixation screws through the left femoral neck fracture. The fixation screws appear intact. No new fracture identified. The bones are osteopenic. There is no dislocation. There is mild-to-moderate osteoarthritic changes of the hips. Lower lumbar degenerative changes. The soft tissues appear unremarkable. IMPRESSION: Interval fixation of previously seen left femoral neck fracture and placement of 3 fixation screws. Electronically Signed   By: Anner Crete M.D.   On: 04/12/2016 23:15   Dg C-arm 1-60 Min  Result Date: 04/13/2016 CLINICAL DATA:  Left hip fracture EXAM: DG C-ARM 61-120 MIN; OPERATIVE LEFT HIP WITH PELVIS COMPARISON:  Left hip radiograph 04/12/2016 FINDINGS: Two fluoroscopic images during internal fixation of left hip fracture are provided. Fluoroscopy time was reported as 64 seconds. There are 3 lag screws traversing the femoral neck. IMPRESSION: Intraoperative fluoroscopy during internal fixation of left hip fracture. Electronically Signed   By: Ulyses Jarred M.D.   On: 04/13/2016 03:47   Dg Hip Operative Unilat W Or W/o Pelvis Left  Result Date: 04/13/2016 CLINICAL DATA:  Left hip fracture EXAM: DG C-ARM 61-120 MIN; OPERATIVE LEFT HIP WITH PELVIS COMPARISON:  Left hip radiograph 04/12/2016 FINDINGS: Two fluoroscopic images during internal fixation of left hip fracture are provided. Fluoroscopy time was reported as 64 seconds. There are 3 lag screws traversing the femoral neck. IMPRESSION: Intraoperative fluoroscopy during internal fixation of left hip fracture. Electronically Signed   By: Ulyses Jarred M.D.   On: 04/13/2016 03:47   Dg Hip Unilat With Pelvis 2-3 Views Left  Result Date: 04/12/2016 CLINICAL DATA:  LEFT hip pain, tripped over a blanket while getting out of chair this evening, unable to rise from lying on x-ray table post imaging, hip pain post fall EXAM: DG HIP (WITH OR WITHOUT PELVIS) 2-3V LEFT  COMPARISON:  None FINDINGS: Diffuse osseous demineralization. Hip and SI joint spaces symmetric and preserved. Subcapital fracture of the LEFT femoral neck, minimally displaced. No dislocation. No additional fracture, dislocation or bone destruction. IMPRESSION: Minimally displaced subcapital fracture LEFT femoral neck. Osseous demineralization. Electronically Signed   By: Lavonia Dana M.D.   On: 04/12/2016 17:35    Assessment/Plan  Unstable gait With closed left hip fracture. Status postsurgical repair.  Will need for her to work with physical therapy and occupational therapy to train her gait and to help regain her balance. Fall precautions to be taken.  Closed left hip fracture Status postsurgical repair on 23rd December. Has follow-up with orthopedic. Continue Tylenol 650 mg twice a day and Norco 5-3 25 mg 1-2 tablets every 6 hours as needed for pain and tramadol 50 mg every 6 hours as needed for pain. Will need for her to work with physical therapy and occupational therapy to help restore her strength and balance. Continue eliquis for DVT prophylaxis.  Thrombocytopenia No bleed reported. Monitor platelet count with her on eliquis.  Atrial fibrillation Rate currently controlled. Continue Cardizem 120 mg daily and eliquis 5 mg twice a day.  Hypothyroidism Continue home regimen levothyroxine. No changes made.  Hypertension Monitor blood pressure readings. Continue hydrochlorothiazide and Cardizem. Check BMP.  Temporal arteritis Continue prednisone 3 mg daily chronically.  Polymyalgia rheumatica Stable at present. Continue chronic prednisone.  Chronic constipation Continue her Senokot S. Hydration encouraged.     Goals of care: short term rehabilitation   Labs/tests ordered: CBC, CMP 04/22/2016  Family/ staff Communication: reviewed care plan with patient and nursing supervisor    Blanchie Serve, MD Internal Medicine [place], [place] [address] Cell Phone (Monday-Friday 8 am - 5 pm): [phone] On Call: [phone] and follow prompts after 5 pm and on weekends Office Phone: [phone] Office Fax: [phone]

## 2016-04-22 LAB — BASIC METABOLIC PANEL
BUN: 22 mg/dL — AB (ref 4–21)
CREATININE: 1 mg/dL (ref 0.5–1.1)
GLUCOSE: 110 mg/dL
POTASSIUM: 3.5 mmol/L (ref 3.4–5.3)
Sodium: 140 mmol/L (ref 137–147)

## 2016-04-22 LAB — CBC AND DIFFERENTIAL
HCT: 44 % (ref 36–46)
Hemoglobin: 14.5 g/dL (ref 12.0–16.0)
Platelets: 100 10*3/uL — AB (ref 150–399)
WBC: 7.5 10*3/mL

## 2016-04-25 ENCOUNTER — Encounter (HOSPITAL_COMMUNITY): Payer: Self-pay | Admitting: Cardiology

## 2016-04-28 ENCOUNTER — Other Ambulatory Visit: Payer: Self-pay | Admitting: *Deleted

## 2016-04-28 MED ORDER — HYDROCODONE-ACETAMINOPHEN 5-325 MG PO TABS: ORAL_TABLET | ORAL | 0 refills | Status: DC

## 2016-04-28 NOTE — Telephone Encounter (Signed)
[name] #[phone] Fax: [phone] 

## 2016-05-09 ENCOUNTER — Non-Acute Institutional Stay (SKILLED_NURSING_FACILITY): Payer: Medicare Other | Admitting: Adult Health

## 2016-05-09 ENCOUNTER — Encounter: Payer: Self-pay | Admitting: Adult Health

## 2016-05-09 DIAGNOSIS — E039 Hypothyroidism, unspecified: Secondary | ICD-10-CM

## 2016-05-09 DIAGNOSIS — K5900 Constipation, unspecified: Secondary | ICD-10-CM

## 2016-05-09 DIAGNOSIS — I10 Essential (primary) hypertension: Secondary | ICD-10-CM

## 2016-05-09 DIAGNOSIS — E785 Hyperlipidemia, unspecified: Secondary | ICD-10-CM | POA: Diagnosis not present

## 2016-05-09 DIAGNOSIS — D696 Thrombocytopenia, unspecified: Secondary | ICD-10-CM

## 2016-05-09 DIAGNOSIS — M353 Polymyalgia rheumatica: Secondary | ICD-10-CM | POA: Diagnosis not present

## 2016-05-09 DIAGNOSIS — S72002S Fracture of unspecified part of neck of left femur, sequela: Secondary | ICD-10-CM | POA: Diagnosis not present

## 2016-05-09 DIAGNOSIS — K219 Gastro-esophageal reflux disease without esophagitis: Secondary | ICD-10-CM

## 2016-05-09 DIAGNOSIS — R2681 Unsteadiness on feet: Secondary | ICD-10-CM | POA: Diagnosis not present

## 2016-05-09 DIAGNOSIS — I480 Paroxysmal atrial fibrillation: Secondary | ICD-10-CM | POA: Diagnosis not present

## 2016-05-09 DIAGNOSIS — M316 Other giant cell arteritis: Secondary | ICD-10-CM

## 2016-05-09 NOTE — Progress Notes (Signed)
DATE:  05/09/2016   MRN:  [id number]  BIRTHDAY: [birth year]  Facility:  [facility] Location:  [facility] Room Number: 104-P  LEVEL OF CARE:  SNF (31)  Contact Information    Name Relation Home Work Mobile   Gallery,Jack Spouse [phone]     Whittington,Debbie Daughter [phone]     No name specified           Code Status History    Date Active Date Inactive Code Status Order ID Comments User Context   04/13/2016  1:10 AM 04/16/2016  7:04 PM Full Code [id number]  Carmon Ginsberg, MD Inpatient   04/12/2016 11:18 PM 04/13/2016  1:10 AM Full Code [id number]  Marchia Bond, MD Inpatient       Chief Complaint  Patient presents with  . Discharge Note    HISTORY OF PRESENT ILLNESS:  This is a 82-YO female seen for a discharge visit.  She is discharging home with home health PT, OT, CNA, nursing and SW services.    She has been admitted to [facility] on 04/16/17 from [facility] admission dates 04/12/17 thru 04/16/17 with left hip closed fracture sustained from a fall. She had surgical repair on12/23/17. She has PMH of polymyalgia rheumatica, hypertension, temporal arteritis, atrial fibrillation and hypothyroidism.  Patient was admitted to this facility for short-term rehabilitation after the patient's recent hospitalization.  Patient has completed SNF rehabilitation and therapy has cleared the patient for discharge.   PAST MEDICAL HISTORY:  Past Medical History:  Diagnosis Date  . Abdominal bruit    Abdominal bruit in the past no aortic aneurysm by evaluation in the past  . Aortic regurgitation    .mild...echo...04/2010  . Arm weakness    Arm weakness with exercise, January, 2012  . Bradycardia   . Constipation    Constipation with diltiazem March, 2012  . Diastolic dysfunction    Mild  . Dizziness   . Ejection fraction 2011   55-60% ejection fraction, echo, January, 2012  . HTN (hypertension)   .  Hypothyroidism   . Leg cramps    from lipitor in the past  . Mitral regurgitation    echo..04/2007  /  mild..04/2010  . PAC (premature atrial contraction)   . Palpitation   . Pericardial effusion    small...posterior...echo..04/2007  . Polymyalgia ([place])   . Pulmonary hypertension   . Rheumatic fever    Questionable history of rheumatic fever, but no history of valvular abnormalities  . Sinus tachycardia   . Temporal arteritis (HCC)    Rule out temporal arteritis in the past  . Tricuspid regurgitation    -moderate...echo..04/2007...Marland KitchenMarland KitchenEF  60%..echo..04/2007  /   55-60%....echo..05/21/2010     CURRENT MEDICATIONS: Reviewed  Patient's Medications  New Prescriptions   No medications on file  Previous Medications   ATORVASTATIN (LIPITOR) 20 MG TABLET    Take 20 mg by mouth daily.    BIOTIN PO    Take 500 mg by mouth daily.   CARTIA XT 120 MG 24 HR CAPSULE    TAKE 1 CAPSULE BY MOUTH  DAILY.   CHOLECALCIFEROL (VITAMIN D3) 1000 UNITS CAPS    Take 1 capsule by mouth daily. 1 tab po qd   ELIQUIS 5 MG TABS TABLET    Take 1 tablet by mouth two  times daily   FISH OIL-OMEGA-3 FATTY ACIDS 1000 MG CAPSULE    Take 1 g by mouth daily.  HYDROCHLOROTHIAZIDE 25 MG TABLET    Take 25 mg by mouth daily.    HYDROCODONE-ACETAMINOPHEN (NORCO/VICODIN) 5-325 MG TABLET    Take 1-2 tablets by mouth 2 (two) times daily as needed for moderate pain (Give 1 tab for mild-moderate pain, 2 tabs for moderate-severe pain).   LEVOTHYROXINE (SYNTHROID, LEVOTHROID) 112 MCG TABLET    Take 112 mcg by mouth daily before breakfast.   MENTHOL (ICY HOT) 5 % PTCH    Apply 1 patch topically daily. Apply to left SI area in a.m., remove at h.s.   OMEPRAZOLE (PRILOSEC OTC) 20 MG TABLET    Take 20 mg by mouth 2 (two) times daily.   POLYETHYLENE GLYCOL (MIRALAX / GLYCOLAX) PACKET    Take 17 g by mouth daily.   PREDNISONE (DELTASONE) 1 MG TABLET    Take 3 mg by mouth daily with breakfast.    PROMETHAZINE (PHENERGAN) 25 MG TABLET    25  mg. Give orally or rectally prn per SO.   SENNOSIDES-DOCUSATE SODIUM (SENOKOT-S) 8.6-50 MG TABLET    Take 2 tablets by mouth 2 (two) times daily.   TRAMADOL (ULTRAM) 50 MG TABLET    Take 1 tablet (50 mg total) by mouth every 6 (six) hours as needed.  Modified Medications   No medications on file  Discontinued Medications   ACETAMINOPHEN (TYLENOL) 325 MG TABLET    Take 650 mg by mouth 2 (two) times daily. For pain   CALCIUM CARBONATE (TUMS) 500 MG CHEWABLE TABLET    Chew 1 tablet by mouth daily.     HYDROCODONE-ACETAMINOPHEN (NORCO/VICODIN) 5-325 MG TABLET    Take one to two tablets by mouth every 6 hours as needed for moderate pain. Do not exceed 4gm of Tylenol in 24 hours   SENNOSIDES-DOCUSATE SODIUM (SENOKOT-S) 8.6-50 MG TABLET    Take 2 tablets by mouth daily.   VITAMIN E (VITAMIN E) 1000 UNIT CAPSULE    Take 1,000 Units by mouth daily.     Allergies  Allergen Reactions  . Cephalexin     Rash  . Ciprofloxacin Swelling    Hives  Tongue Sweeling  . Lisinopril     Cougfh  . Sulfamethoxazole-Trimethoprim     Rash  . Sulfonamide Derivatives     Rash     REVIEW OF SYSTEMS:  GENERAL: no change in appetite, no fatigue, no weight changes, no fever, chills or weakness EYES: Denies change in vision, dry eyes, eye pain, itching or discharge EARS: Denies change in hearing, ringing in ears, or earache NOSE: Denies nasal congestion or epistaxis MOUTH and THROAT: Denies oral discomfort, gingival pain or bleeding, pain from teeth or hoarseness   RESPIRATORY: no cough, SOB, DOE, wheezing, hemoptysis CARDIAC: no chest pain, edema or palpitations GI: no abdominal pain, diarrhea, constipation, heart burn, nausea or vomiting GU: Denies dysuria, frequency, hematuria, incontinence, or discharge PSYCHIATRIC: Denies feeling of depression or anxiety. No report of hallucinations, insomnia, paranoia, or agitation   PHYSICAL EXAMINATION  GENERAL APPEARANCE: Well nourished. In no acute distress.  Normal body habitus SKIN:  Left thigh surgical incision has steri-strips, dry and no redness HEAD: Normal in size and contour. No evidence of trauma EYES: Lids open and close normally. No blepharitis, entropion or ectropion. PERRL. Conjunctivae are clear and sclerae are white. Lenses are without opacity EARS: Pinnae are normal. Patient hears normal voice tunes of the examiner MOUTH and THROAT: Lips are without lesions. Oral mucosa is moist and without lesions. Tongue is normal in shape, size, and color  and without lesions NECK: supple, trachea midline, no neck masses, no thyroid tenderness, no thyromegaly LYMPHATICS: no LAN in the neck, no supraclavicular LAN RESPIRATORY: breathing is even & unlabored, BS CTAB CARDIAC: RRR, no murmur,no extra heart sounds, no edema GI: abdomen soft, normal BS, no masses, no tenderness, no hepatomegaly, no splenomegaly EXTREMITIES:   Able to move X 4 extremities  PSYCHIATRIC: Alert and oriented X 3. Affect and behavior are appropriate   LABS/RADIOLOGY: Labs reviewed: Basic Metabolic Panel:  Recent Labs  04/12/16 1819 04/13/16 0541 04/14/16 0456 04/22/16  NA 138 138 136 140  K 3.0* 3.4* 4.0 3.5  CL 103 100* 101  --   CO2 26 28 32  --   GLUCOSE 89 87 94  --   BUN 27* 16 14 22*  CREATININE 0.94 0.91 0.86 1.0  CALCIUM 8.9 9.0 8.4*  --     CBC:  Recent Labs  04/12/16 1819 04/13/16 0541 04/22/16  WBC 14.0* 9.5 7.5  HGB 14.2 13.4 14.5  HCT 41.9 40.1 44  MCV 88.0 88.9  --   PLT 132* 87* 100*   Pelvis Portable  Result Date: 04/12/2016 CLINICAL DATA:  82 year old female status post ORIF of the left hip. EXAM: PORTABLE PELVIS 1-2 VIEWS COMPARISON:  Left hip radiograph dated 04/12/2016 FINDINGS: There has been interval placement of 3 fixation screws through the left femoral neck fracture. The fixation screws appear intact. No new fracture identified. The bones are osteopenic. There is no dislocation. There is mild-to-moderate osteoarthritic  changes of the hips. Lower lumbar degenerative changes. The soft tissues appear unremarkable. IMPRESSION: Interval fixation of previously seen left femoral neck fracture and placement of 3 fixation screws. Electronically Signed   By: Anner Crete M.D.   On: 04/12/2016 23:15   Dg C-arm 1-60 Min  Result Date: 04/13/2016 CLINICAL DATA:  Left hip fracture EXAM: DG C-ARM 61-120 MIN; OPERATIVE LEFT HIP WITH PELVIS COMPARISON:  Left hip radiograph 04/12/2016 FINDINGS: Two fluoroscopic images during internal fixation of left hip fracture are provided. Fluoroscopy time was reported as 64 seconds. There are 3 lag screws traversing the femoral neck. IMPRESSION: Intraoperative fluoroscopy during internal fixation of left hip fracture. Electronically Signed   By: Ulyses Jarred M.D.   On: 04/13/2016 03:47   Dg Hip Operative Unilat W Or W/o Pelvis Left  Result Date: 04/13/2016 CLINICAL DATA:  Left hip fracture EXAM: DG C-ARM 61-120 MIN; OPERATIVE LEFT HIP WITH PELVIS COMPARISON:  Left hip radiograph 04/12/2016 FINDINGS: Two fluoroscopic images during internal fixation of left hip fracture are provided. Fluoroscopy time was reported as 64 seconds. There are 3 lag screws traversing the femoral neck. IMPRESSION: Intraoperative fluoroscopy during internal fixation of left hip fracture. Electronically Signed   By: Ulyses Jarred M.D.   On: 04/13/2016 03:47   Dg Hip Unilat With Pelvis 2-3 Views Left  Result Date: 04/12/2016 CLINICAL DATA:  LEFT hip pain, tripped over a blanket while getting out of chair this evening, unable to rise from lying on x-ray table post imaging, hip pain post fall EXAM: DG HIP (WITH OR WITHOUT PELVIS) 2-3V LEFT COMPARISON:  None FINDINGS: Diffuse osseous demineralization. Hip and SI joint spaces symmetric and preserved. Subcapital fracture of the LEFT femoral neck, minimally displaced. No dislocation. No additional fracture, dislocation or bone destruction. IMPRESSION: Minimally displaced  subcapital fracture LEFT femoral neck. Osseous demineralization. Electronically Signed   By: Lavonia Dana M.D.   On: 04/12/2016 17:35    ASSESSMENT/PLAN:  Unsteady gait - for home  health PT and OT, for therapeutic strengthening exercises; fall precautions  Closed left hip fracture - S/P surgical repair, continue Norco 5/325 mg 1-2 tabs by mouth twice a day when necessary and tramadol 50 mg 1 tab by mouth every 6 hours when necessary for pain; follow-up with orthopedics  Atrial fibrillation - rate controlled; continue Eliquis 5 mg 1 tab by mouth twice a day and Cardizem CD 120 mg 1 capsule by mouth daily  GERD - continue Prilosec 20 mg by mouth twice a day  Chronic constipation - tinea senna S 2 tabs by mouth twice a day and MiraLAX 17 g by mouth daily  Essential hypertension - well-controlled; continue Cardizem CD 120 mg 1 capsule by mouth daily and hydrochlorothiazide 25 mg 1 tab by mouth daily  Polymyalgia rheumatica - stable; continue prednisone 1 mg take 3 tabs = 3 mg by mouth daily  Temporal arteritis - continue prednisone 3 mg by mouth daily  Hyperlipidemia - continue Lipitor 20 mg 1 tab by mouth daily at bedtime  Hypothyroidism - continue Synthroid 112 g 1 tab by mouth daily  Thrombocytopenia -  Improving, no bruising nor bleeding  Lab Results  Component Value Date   PLT 100 (A) 04/22/2016       I have filled out patient's discharge paperwork and written prescriptions.  Patient will receive home health PT, OT, SW, Nursing and CNA.  DME provided:  None  Total discharge time: Greater than 30 minutes Greater than 50% was spent in counseling and coordination of care.  Discharge time involved coordination of the discharge process with social worker, nursing staff and therapy department. Medical justification for home health services verified.    Monina C. Ricardo - NP    [employer] [phone]

## 2016-06-06 ENCOUNTER — Telehealth: Payer: Self-pay | Admitting: Cardiology

## 2016-06-06 NOTE — Telephone Encounter (Signed)
I left a message on Lauren's VM stating that they need to fax [place] a EF request form to our Medical records dept at [phone] in order to receive this information. I also left this office phone number so she can call if she has any questions.

## 2016-06-06 NOTE — Telephone Encounter (Signed)
Samantha Butcher  RN  [facility] [phone]  Called needs dx of CHF  Or Cardiomyopathy called in please.

## 2016-06-10 ENCOUNTER — Ambulatory Visit (HOSPITAL_COMMUNITY): Payer: Medicare Other | Attending: Cardiovascular Disease

## 2016-06-10 ENCOUNTER — Other Ambulatory Visit: Payer: Self-pay

## 2016-06-10 ENCOUNTER — Other Ambulatory Visit: Payer: Self-pay | Admitting: *Deleted

## 2016-06-10 DIAGNOSIS — I313 Pericardial effusion (noninflammatory): Secondary | ICD-10-CM | POA: Diagnosis not present

## 2016-06-10 DIAGNOSIS — I361 Nonrheumatic tricuspid (valve) insufficiency: Secondary | ICD-10-CM | POA: Insufficient documentation

## 2016-06-10 DIAGNOSIS — I119 Hypertensive heart disease without heart failure: Secondary | ICD-10-CM | POA: Insufficient documentation

## 2016-06-10 DIAGNOSIS — I340 Nonrheumatic mitral (valve) insufficiency: Secondary | ICD-10-CM | POA: Diagnosis not present

## 2016-06-10 DIAGNOSIS — I351 Nonrheumatic aortic (valve) insufficiency: Secondary | ICD-10-CM | POA: Insufficient documentation

## 2016-06-10 MED ORDER — APIXABAN 5 MG PO TABS: 5.0000 mg | ORAL_TABLET | Freq: Two times a day (BID) | ORAL | 0 refills | Status: DC

## 2016-06-11 ENCOUNTER — Other Ambulatory Visit: Payer: Self-pay | Admitting: Cardiology

## 2016-06-11 ENCOUNTER — Encounter: Payer: Self-pay | Admitting: Cardiology

## 2016-06-11 MED ORDER — APIXABAN 5 MG PO TABS
5.0000 mg | ORAL_TABLET | Freq: Two times a day (BID) | ORAL | 0 refills | Status: DC
Start: 2016-06-11 — End: 2016-08-08

## 2016-06-11 NOTE — Telephone Encounter (Signed)
New Message:    Pt wants to know if her Eliquis have been called into [facility].Marland Kitchen She said she went to [facility] to get her 2 weeks supply that was supposed to be called in and they said they could not do this.She needs some Eliquis until her mail order comes in.

## 2016-06-11 NOTE — Telephone Encounter (Signed)
Spoke to patient and she states that she is out of medication and that [facility] refused to "override" her prescription. When questioned as to what override meant she states that she refused to pay for her prescription. She states that an RX should have been sent to [facility] and that if she has a stroke and dies it is our fault. I offered to check for samples and she stated that she would not drive to office to get samples. Tried to explain that RX was sent to [facility]. She states "Thanks for nothing" and hung up the phone.

## 2016-06-12 ENCOUNTER — Telehealth: Payer: Self-pay

## 2016-06-12 DIAGNOSIS — I351 Nonrheumatic aortic (valve) insufficiency: Secondary | ICD-10-CM

## 2016-06-12 NOTE — Telephone Encounter (Signed)
-----   Message from Sueanne Margarita, MD sent at 06/11/2016  8:42 AM EST ----- Echo showed normal LVF with mild LVH, moderate AR, mild MR, mild LAE, mild TR - repeat echo in 1 year for progression of AR

## 2016-06-12 NOTE — Telephone Encounter (Signed)
Informed patient of results and verbal understanding expressed.  Repeat ECHO ordered to be scheduled in 1 year. Patient agrees with treatment plan. 

## 2016-06-24 ENCOUNTER — Encounter: Payer: Self-pay | Admitting: Cardiology

## 2016-07-01 ENCOUNTER — Encounter (INDEPENDENT_AMBULATORY_CARE_PROVIDER_SITE_OTHER): Payer: Self-pay

## 2016-07-01 ENCOUNTER — Ambulatory Visit (INDEPENDENT_AMBULATORY_CARE_PROVIDER_SITE_OTHER): Payer: Medicare Other | Admitting: Cardiology

## 2016-07-01 ENCOUNTER — Encounter: Payer: Self-pay | Admitting: Cardiology

## 2016-07-01 VITALS — BP 148/60 | HR 63 | Ht 66.0 in | Wt 119.0 lb

## 2016-07-01 DIAGNOSIS — I351 Nonrheumatic aortic (valve) insufficiency: Secondary | ICD-10-CM | POA: Diagnosis not present

## 2016-07-01 DIAGNOSIS — I481 Persistent atrial fibrillation: Secondary | ICD-10-CM | POA: Diagnosis not present

## 2016-07-01 DIAGNOSIS — I4819 Other persistent atrial fibrillation: Secondary | ICD-10-CM

## 2016-07-01 DIAGNOSIS — I10 Essential (primary) hypertension: Secondary | ICD-10-CM

## 2016-07-01 MED ORDER — DILTIAZEM HCL 30 MG PO TABS: 30.0000 mg | ORAL_TABLET | Freq: Every day | ORAL | 3 refills | Status: DC | PRN

## 2016-07-01 MED ORDER — DILTIAZEM HCL ER COATED BEADS 180 MG PO CP24: 180.0000 mg | ORAL_CAPSULE | Freq: Every day | ORAL | 3 refills | Status: DC

## 2016-07-01 NOTE — Patient Instructions (Addendum)
Medication Instructions:  1) INCREASE CARDIZEM CD to 180 mg daily 2) START CARDIZEM 30 mg daily AS NEEDED for breakthrough atrial fibrillation  Labwork: TODAY: BMET, CBC  Testing/Procedures: Your physician has requested that you have an echocardiogram in February, 2019. Echocardiography is a painless test that uses sound waves to create images of your heart. It provides your doctor with information about the size and shape of your heart and how well your heart's chambers and valves are working. This procedure takes approximately one hour. There are no restrictions for this procedure.  Follow-Up: Your physician wants you to follow-up in: 6 months with Dr. Radford Pax. You will receive a reminder letter in the mail two months in advance. If you don't receive a letter, please call our office to schedule the follow-up appointment.   Any Other Special Instructions Will Be Listed Below (If Applicable).     If you need a refill on your cardiac medications before your next appointment, please call your pharmacy.

## 2016-07-01 NOTE — Progress Notes (Signed)
Cardiology Office Note    Date:  07/01/2016   ID:  Samantha Henderson, DOB 03-17-1935, MRN [id number]  PCP:  Gerrit Heck, MD  Cardiologist:  Fransico Him, MD   Chief Complaint  Patient presents with  . Atrial Fibrillation  . Mitral Regurgitation  . Hypertension    History of Present Illness:  Samantha Henderson is a 82 y.o. female who presents for followup of persistent atrial fibrillation moderate AR, mild MR and HTN.  She is on chronic anticoagulation  without any bleeding problems.  She denies any chest pain, pressure, SOB, DOE, LE edema , claudication, dizziness or syncope. She occasionally has a breakthrough of palpitations.  This occurs randomly and had an episode last weekend and she says that it make her body shake.  It usually lasts a few hours and resolves spontaneously.  She denies any alcohol use and drinks 1-2 cups of coffee in the am.      Past Medical History:  Diagnosis Date  . Abdominal bruit    Abdominal bruit in the past no aortic aneurysm by evaluation in the past  . Aortic regurgitation    Moderate by echo 05/2016  . Arm weakness    Arm weakness with exercise, January, 2012  . Bradycardia   . Constipation    Constipation with diltiazem March, 2012  . Diastolic dysfunction    Mild  . Dizziness   . Ejection fraction 2011   55-60% ejection fraction, echo, January, 2012  . HTN (hypertension)   . Hypothyroidism   . Leg cramps    from lipitor in the past  . Mitral regurgitation    echo..04/2007  /  mild..04/2010  . PAC (premature atrial contraction)   . Palpitation   . Pericardial effusion    small...posterior...echo..04/2007  . Polymyalgia ([place])   . Pulmonary hypertension   . Rheumatic fever    Questionable history of rheumatic fever, but no history of valvular abnormalities  . Sinus tachycardia   . Temporal arteritis (HCC)    Rule out temporal arteritis in the past  . Tricuspid regurgitation    -moderate...echo..04/2007...Marland KitchenMarland KitchenEF  60%..echo..04/2007  /    55-60%....echo..05/21/2010    Past Surgical History:  Procedure Laterality Date  . BACK SURGERY    . BUNIONECTOMY    . HIP PINNING,CANNULATED Left 04/12/2016   Procedure: CANNULATED HIP PINNING;  Surgeon: Marchia Bond, MD;  Location: [place];  Service: Orthopedics;  Laterality: Left;  . NECK SURGERY    . TOTAL ABDOMINAL HYSTERECTOMY      Current Medications: Current Meds  Medication Sig  . Acetaminophen (TYLENOL PO) Take 650 mg by mouth as needed.  Marland Kitchen apixaban (ELIQUIS) 5 MG TABS tablet Take 1 tablet (5 mg total) by mouth 2 (two) times daily.  Marland Kitchen atorvastatin (LIPITOR) 20 MG tablet Take 20 mg by mouth daily.   Marland Kitchen BIOTIN PO Take 500 mg by mouth daily.  Marland Kitchen CARTIA XT 120 MG 24 hr capsule TAKE 1 CAPSULE BY MOUTH  DAILY.  Marland Kitchen Cholecalciferol (VITAMIN D3) 1000 UNITS CAPS Take 1 capsule by mouth daily. 1 tab po qd  . fish oil-omega-3 fatty acids 1000 MG capsule Take 1 g by mouth daily.    . hydrochlorothiazide 25 MG tablet Take 25 mg by mouth daily.   Marland Kitchen levothyroxine (SYNTHROID, LEVOTHROID) 112 MCG tablet Take 112 mcg by mouth daily before breakfast.  . Melatonin 5 MG CAPS Take 5 mg by mouth as needed.  Marland Kitchen omeprazole (PRILOSEC OTC) 20 MG tablet Take  20 mg by mouth 2 (two) times daily.  . polyethylene glycol (MIRALAX / GLYCOLAX) packet Take 17 g by mouth daily.  . predniSONE (DELTASONE) 1 MG tablet Take 3 mg by mouth daily with breakfast.   . sennosides-docusate sodium (SENOKOT-S) 8.6-50 MG tablet Take 2 tablets by mouth 2 (two) times daily.  . traMADol (ULTRAM) 50 MG tablet Take 1 tablet (50 mg total) by mouth every 6 (six) hours as needed.    Allergies:   Cephalexin; Ciprofloxacin; Lisinopril; Sulfamethoxazole-trimethoprim; and Sulfonamide derivatives   Social History   Social History  . Marital status: Widowed    Spouse name: N/A  . Number of children: N/A  . Years of education: N/A   Social History Main Topics  . Smoking status: Never Smoker  . Smokeless tobacco: Never Used  .  Alcohol use No  . Drug use: No  . Sexual activity: Not Asked   Other Topics Concern  . None   Social History Narrative  . None     Family History:  The patient's family history includes Heart disease in her mother; Stroke in her mother.   ROS:   Please see the history of present illness.    ROS All other systems reviewed and are negative.  No flowsheet data found.     PHYSICAL EXAM:   VS:  BP (!) 148/60   Pulse 63   Ht 5\' 6"  (1.676 m)   Wt 119 lb (54 kg)   SpO2 99%   BMI 19.21 kg/m    GEN: Well nourished, well developed, in no acute distress  HEENT: normal  Neck: no JVD, carotid bruits, or masses Cardiac: RRR; no murmurs, rubs, or gallops,no edema.  Intact distal pulses bilaterally.  Respiratory:  clear to auscultation bilaterally, normal work of breathing GI: soft, nontender, nondistended, + BS MS: no deformity or atrophy  Skin: warm and dry, no rash Neuro:  Alert and Oriented x 3, Strength and sensation are intact Psych: euthymic mood, full affect  Wt Readings from Last 3 Encounters:  07/01/16 119 lb (54 kg)  05/09/16 131 lb (59.4 kg)  04/17/16 131 lb 9.6 oz (59.7 kg)      Studies/Labs Reviewed:   EKG:  EKG is ordered today.  The ekg ordered today demonstrates NSR with no ST changes  Recent Labs: 04/22/2016: BUN 22; Creatinine 1.0; Hemoglobin 14.5; Platelets 100; Potassium 3.5; Sodium 140   Lipid Panel No results found for: CHOL, TRIG, HDL, CHOLHDL, VLDL, LDLCALC, LDLDIRECT  Additional studies/ records that were reviewed today include:  none    ASSESSMENT:    No diagnosis found.   PLAN:  In order of problems listed above:  1. Persistent atrial fibrillation maintaining NSR but having occasional breakthrough of palpitations.  I will increase her Cardizem CD to 180mg  daily and give her a Rx for short acting cardizem 30mg  to take as needed for breakthrough palpitations.  She will continue on Eliquis.  I will check a CBC today.  2. Moderate AR and  mild MR by recent echo with normal LV dimensions.  I will repeat echo in 1 year to make sure there has been no progression.  She is asymptomatic.  3. HTN - BP borderline controlled on current meds.  She will continue on CCB and diuretic. I am increasing her CCB for suppression of PAF which should help with her BP.   I will check a BMET today.      Medication Adjustments/Labs and Tests Ordered: Current medicines are reviewed  at length with the patient today.  Concerns regarding medicines are outlined above.  Medication changes, Labs and Tests ordered today are listed in the Patient Instructions below.  There are no Patient Instructions on file for this visit.   Signed, Fransico Him, MD  07/01/2016 2:03 PM    [facility] [address], [place], [place]  [address] Phone: [phone]; Fax: [phone]

## 2016-07-11 ENCOUNTER — Telehealth: Payer: Self-pay

## 2016-07-11 NOTE — Telephone Encounter (Signed)
Clearance request received from [facility] (Dr. Oletta Lamas) to hold Eliquis for colonoscopy scheduled 08/26/16.  Medication recommendations to be faxed to: [phone]

## 2016-07-11 NOTE — Telephone Encounter (Signed)
Forwarded to Dr. Oletta Lamas.  Fax: [phone]

## 2016-07-11 NOTE — Telephone Encounter (Signed)
Pt on Eliquis for Afib with CHADS<4 and no history of stroke/TIA. Per protocol ok to hold 24 hours prior to procedure.

## 2016-08-08 ENCOUNTER — Other Ambulatory Visit: Payer: Self-pay | Admitting: Cardiology

## 2016-08-26 ENCOUNTER — Encounter: Payer: Self-pay | Admitting: Cardiology

## 2016-09-30 ENCOUNTER — Ambulatory Visit (INDEPENDENT_AMBULATORY_CARE_PROVIDER_SITE_OTHER): Payer: Medicare Other | Admitting: Ophthalmology

## 2016-09-30 DIAGNOSIS — I10 Essential (primary) hypertension: Secondary | ICD-10-CM

## 2016-09-30 DIAGNOSIS — M069 Rheumatoid arthritis, unspecified: Secondary | ICD-10-CM | POA: Diagnosis not present

## 2016-09-30 DIAGNOSIS — H35033 Hypertensive retinopathy, bilateral: Secondary | ICD-10-CM

## 2016-09-30 DIAGNOSIS — H33302 Unspecified retinal break, left eye: Secondary | ICD-10-CM | POA: Diagnosis not present

## 2016-09-30 DIAGNOSIS — H43813 Vitreous degeneration, bilateral: Secondary | ICD-10-CM | POA: Diagnosis not present

## 2016-09-30 DIAGNOSIS — H2512 Age-related nuclear cataract, left eye: Secondary | ICD-10-CM

## 2017-01-16 ENCOUNTER — Encounter: Payer: Self-pay | Admitting: Cardiology

## 2017-01-21 ENCOUNTER — Ambulatory Visit: Payer: Medicare Other | Admitting: Cardiology

## 2017-01-27 ENCOUNTER — Encounter: Payer: Self-pay | Admitting: Cardiology

## 2017-01-27 ENCOUNTER — Ambulatory Visit (INDEPENDENT_AMBULATORY_CARE_PROVIDER_SITE_OTHER): Payer: Medicare Other | Admitting: Cardiology

## 2017-01-27 ENCOUNTER — Ambulatory Visit: Payer: Medicare Other | Admitting: Cardiology

## 2017-01-27 VITALS — BP 112/58 | HR 63 | Ht 66.0 in | Wt 126.4 lb

## 2017-01-27 DIAGNOSIS — I4819 Other persistent atrial fibrillation: Secondary | ICD-10-CM

## 2017-01-27 DIAGNOSIS — I351 Nonrheumatic aortic (valve) insufficiency: Secondary | ICD-10-CM | POA: Diagnosis not present

## 2017-01-27 DIAGNOSIS — I10 Essential (primary) hypertension: Secondary | ICD-10-CM | POA: Insufficient documentation

## 2017-01-27 DIAGNOSIS — I481 Persistent atrial fibrillation: Secondary | ICD-10-CM | POA: Diagnosis not present

## 2017-01-27 HISTORY — DX: Essential (primary) hypertension: I10

## 2017-01-27 NOTE — Progress Notes (Signed)
Cardiology Office Note:    Date:  01/27/2017   ID:  Samantha Henderson, DOB 04-20-1935, MRN [id number]  PCP:  Leighton Ruff, MD  Cardiologist:  Fransico Him, MD   Referring MD: Leighton Ruff, MD   Chief Complaint  Patient presents with  . Atrial Fibrillation  . Aortic Insuffiency  . Hypertension    History of Present Illness:    Samantha Henderson is a 82 y.o. female with a hx of persistent atrial fibrillation, moderate AR, mild MR and HTN.   She is here today for followup and is doing well.  She denies any chest pain or pressure, SOB, DOE, PND, orthopnea, LE edema, dizziness or syncope. She has had about 3 episodes of afib that last not longer than an hour and she has not had any in some time.  She is compliant with her meds and is tolerating meds with no SE.    Past Medical History:  Diagnosis Date  . Abdominal bruit    Abdominal bruit in the past no aortic aneurysm by evaluation in the past  . Aortic regurgitation    Moderate by echo 05/2016  . Arm weakness    Arm weakness with exercise, January, 2012  . Benign essential HTN 01/27/2017  . Bradycardia   . Constipation    Constipation with diltiazem March, 2012  . Diastolic dysfunction    Mild  . Dizziness   . Ejection fraction 2011   55-60% ejection fraction, echo, January, 2012  . HTN (hypertension)   . Hypothyroidism   . Leg cramps    from lipitor in the past  . Mitral regurgitation    echo..04/2007  /  mild..04/2010  . PAC (premature atrial contraction)   . Palpitation   . Pericardial effusion    small...posterior...echo..04/2007  . Polymyalgia ([place])   . Pulmonary hypertension ([place])   . Rheumatic fever    Questionable history of rheumatic fever, but no history of valvular abnormalities  . Sinus tachycardia   . Temporal arteritis (HCC)    Rule out temporal arteritis in the past  . Tricuspid regurgitation    -moderate...echo..04/2007...Marland KitchenMarland KitchenEF  60%..echo..04/2007  /   55-60%....echo..05/21/2010    Past Surgical History:    Procedure Laterality Date  . BACK SURGERY    . BUNIONECTOMY    . HIP PINNING,CANNULATED Left 04/12/2016   Procedure: CANNULATED HIP PINNING;  Surgeon: Marchia Bond, MD;  Location: [place];  Service: Orthopedics;  Laterality: Left;  . NECK SURGERY    . TOTAL ABDOMINAL HYSTERECTOMY      Current Medications: Current Meds  Medication Sig  . Acetaminophen (TYLENOL PO) Take 650 mg by mouth as needed.  Marland Kitchen atorvastatin (LIPITOR) 20 MG tablet Take 20 mg by mouth daily.   Marland Kitchen BIOTIN PO Take 500 mg by mouth daily.  . Cholecalciferol (VITAMIN D3) 1000 UNITS CAPS Take 1 capsule by mouth daily. 1 tab po qd  . diltiazem (CARDIZEM CD) 180 MG 24 hr capsule Take 1 capsule (180 mg total) by mouth daily.  Marland Kitchen diltiazem (CARDIZEM) 30 MG tablet Take 1 tablet (30 mg total) by mouth daily as needed. For breakthrough atrial fibrillation.  Marland Kitchen ELIQUIS 5 MG TABS tablet TAKE 1 TABLET BY MOUTH TWO  TIMES DAILY  . fish oil-omega-3 fatty acids 1000 MG capsule Take 1 g by mouth daily.    . folic acid (FOLVITE) 1 MG tablet TK 1 T PO QD  . hydrochlorothiazide 25 MG tablet Take 25 mg by mouth daily.   Marland Kitchen  levothyroxine (SYNTHROID, LEVOTHROID) 112 MCG tablet Take 112 mcg by mouth daily before breakfast.  . methotrexate (RHEUMATREX) 2.5 MG tablet TK 6 TS PO AT THE SAME TIME ONCE A WK  . omeprazole (PRILOSEC OTC) 20 MG tablet Take 20 mg by mouth 2 (two) times daily.  . polyethylene glycol (MIRALAX / GLYCOLAX) packet Take 17 g by mouth daily.  . predniSONE (DELTASONE) 1 MG tablet Take 3 mg by mouth daily with breakfast.   . sennosides-docusate sodium (SENOKOT-S) 8.6-50 MG tablet Take 2 tablets by mouth 2 (two) times daily.  . traMADol (ULTRAM) 50 MG tablet Take 1 tablet (50 mg total) by mouth every 6 (six) hours as needed.  . Turmeric 500 MG CAPS Take 500 mg by mouth daily.     Allergies:   Cephalexin; Ciprofloxacin; Lisinopril; Sulfamethoxazole-trimethoprim; and Sulfonamide derivatives   Social History   Social History  .  Marital status: Widowed    Spouse name: N/A  . Number of children: N/A  . Years of education: N/A   Social History Main Topics  . Smoking status: Never Smoker  . Smokeless tobacco: Never Used  . Alcohol use No  . Drug use: No  . Sexual activity: Not Asked   Other Topics Concern  . None   Social History Narrative  . None     Family History: The patient's family history includes Heart disease in her mother; Stroke in her mother.  ROS:   Please see the history of present illness.    ROS  All other systems reviewed and negative.   EKGs/Labs/Other Studies Reviewed:    The following studies were reviewed today: None  EKG:  EKG is ordered today abnd showed sinus bradycardia at 52bpm with PACs  Recent Labs: 04/22/2016: BUN 22; Creatinine 1.0; Hemoglobin 14.5; Platelets 100; Potassium 3.5; Sodium 140   Recent Lipid Panel No results found for: CHOL, TRIG, HDL, CHOLHDL, VLDL, LDLCALC, LDLDIRECT  Physical Exam:    VS:  BP (!) 112/58   Pulse 63   Ht 5\' 6"  (1.676 m)   Wt 126 lb 6.4 oz (57.3 kg)   SpO2 95%   BMI 20.40 kg/m     Wt Readings from Last 3 Encounters:  01/27/17 126 lb 6.4 oz (57.3 kg)  07/01/16 119 lb (54 kg)  05/09/16 131 lb (59.4 kg)     GEN:  Well nourished, well developed in no acute distress HEENT: Normal NECK: No JVD; No carotid bruits LYMPHATICS: No lymphadenopathy CARDIAC: RRR, no murmurs, rubs, gallops RESPIRATORY:  Clear to auscultation without rales, wheezing or rhonchi  ABDOMEN: Soft, non-tender, non-distended MUSCULOSKELETAL:  No edema; No deformity  SKIN: Warm and dry NEUROLOGIC:  Alert and oriented x 3 PSYCHIATRIC:  Normal affect   ASSESSMENT:    1. Persistent atrial fibrillation ([place])   2. Nonrheumatic aortic valve insufficiency   3. Benign essential HTN    PLAN:    In order of problems listed above:  1.  Persistent atrial fibrillation - she is maintaining NSR.  She will continue on Cardizem CD 180mg  daily and Eliquis 5mg  BID.  I  will check a BMET and CBC today.   2.  Moderate AR by echo 05/2016 - she is asymptomatic.  LV size and function are normal.  Will repeat echo 05/2017 to assess for progression.    3.  HTN - BP is well controlled on exam today.  She will continue on diuretic and CCB.     Medication Adjustments/Labs and Tests Ordered: Current medicines  are reviewed at length with the patient today.  Concerns regarding medicines are outlined above.  No orders of the defined types were placed in this encounter.  No orders of the defined types were placed in this encounter.   Signed, Fransico Him, MD  01/27/2017 8:49 AM    [place]

## 2017-01-27 NOTE — Patient Instructions (Signed)
Medication Instructions:  Your physician recommends that you continue on your current medications as directed. Please refer to the Current Medication list given to you today.  Labwork: BMET and CBC today  Testing/Procedures: None  Follow-Up: Your physician wants you to follow-up in: 6 months with Dr. Radford Pax.  You will receive a reminder letter in the mail two months in advance. If you don't receive a letter, please call our office to schedule the follow-up appointment.   Any Other Special Instructions Will Be Listed Below (If Applicable).     If you need a refill on your cardiac medications before your next appointment, please call your pharmacy.

## 2017-01-28 LAB — CBC
Hematocrit: 37.4 % (ref 34.0–46.6)
Hemoglobin: 12.6 g/dL (ref 11.1–15.9)
MCH: 31.3 pg (ref 26.6–33.0)
MCHC: 33.7 g/dL (ref 31.5–35.7)
MCV: 93 fL (ref 79–97)
RBC: 4.03 x10E6/uL (ref 3.77–5.28)
RDW: 14.4 % (ref 12.3–15.4)
WBC: 6.6 10*3/uL (ref 3.4–10.8)

## 2017-01-28 LAB — BASIC METABOLIC PANEL
BUN / CREAT RATIO: 16 (ref 12–28)
BUN: 19 mg/dL (ref 8–27)
CALCIUM: 9.9 mg/dL (ref 8.7–10.3)
CO2: 28 mmol/L (ref 20–29)
CREATININE: 1.19 mg/dL — AB (ref 0.57–1.00)
Chloride: 100 mmol/L (ref 96–106)
GFR calc Af Amer: 50 mL/min/{1.73_m2} — ABNORMAL LOW (ref >59)
GFR calc non Af Amer: 43 mL/min/{1.73_m2} — ABNORMAL LOW (ref >59)
Glucose: 72 mg/dL (ref 65–99)
Potassium: 3.6 mmol/L (ref 3.5–5.2)
Sodium: 142 mmol/L (ref 134–144)

## 2017-01-29 ENCOUNTER — Telehealth: Payer: Self-pay | Admitting: *Deleted

## 2017-01-29 NOTE — Telephone Encounter (Signed)
Left message to go over new recommendation from Dr. Radford Pax to add K+ 20 meq daily with BMET to be done in 1 week.

## 2017-01-30 ENCOUNTER — Telehealth: Payer: Self-pay | Admitting: Cardiology

## 2017-01-30 ENCOUNTER — Other Ambulatory Visit: Payer: Self-pay | Admitting: Cardiology

## 2017-01-30 DIAGNOSIS — I5189 Other ill-defined heart diseases: Secondary | ICD-10-CM

## 2017-01-30 DIAGNOSIS — I10 Essential (primary) hypertension: Secondary | ICD-10-CM

## 2017-01-30 DIAGNOSIS — I4819 Other persistent atrial fibrillation: Secondary | ICD-10-CM

## 2017-01-30 MED ORDER — POTASSIUM CHLORIDE CRYS ER 20 MEQ PO TBCR: 20.0000 meq | EXTENDED_RELEASE_TABLET | Freq: Every day | ORAL | 1 refills | Status: DC

## 2017-01-30 NOTE — Telephone Encounter (Signed)
Follow Up:; ° ° °Returning your call. °

## 2017-01-30 NOTE — Telephone Encounter (Signed)
Called pt back at 3:20 pm, Lmtcb for lab results and recommendations, per Dr. Radford Pax.

## 2017-01-30 NOTE — Telephone Encounter (Signed)
Called pt per Notes recorded by Sueanne Margarita, MD on 01/28/2017 at 9:43 AM EDT Add Kdur 12meq daily and repeat BMET in 1 week. Pt's medication was sent to pt's pharmacy and pt was given a date on 02/06/17 for pt to come in to repeat a BMET. I advised the pt that if she has any other problems, questions or concerns to call the office. Pt verbalized understanding

## 2017-01-30 NOTE — Telephone Encounter (Signed)
This call was made yesterday while I was in the [facility] Triage Pool. I will route this back to the [facility] Triage Pool so the [place] in triage today may call the pt today.

## 2017-01-30 NOTE — Telephone Encounter (Signed)
Called pt back and left message for pt to call back again for lab results and recommendations per Dr. Radford Pax.

## 2017-01-30 NOTE — Telephone Encounter (Signed)
F/u Message ° °Pt returning RN call. Please call back to discuss  °

## 2017-02-06 ENCOUNTER — Other Ambulatory Visit: Payer: Medicare Other | Admitting: *Deleted

## 2017-02-06 ENCOUNTER — Telehealth: Payer: Self-pay

## 2017-02-06 DIAGNOSIS — I4819 Other persistent atrial fibrillation: Secondary | ICD-10-CM

## 2017-02-06 DIAGNOSIS — I10 Essential (primary) hypertension: Secondary | ICD-10-CM

## 2017-02-06 DIAGNOSIS — I5189 Other ill-defined heart diseases: Secondary | ICD-10-CM

## 2017-02-06 LAB — BASIC METABOLIC PANEL
BUN/Creatinine Ratio: 16 (ref 12–28)
BUN: 17 mg/dL (ref 8–27)
CO2: 24 mmol/L (ref 20–29)
CREATININE: 1.08 mg/dL — AB (ref 0.57–1.00)
Calcium: 9.6 mg/dL (ref 8.7–10.3)
Chloride: 101 mmol/L (ref 96–106)
GFR calc Af Amer: 56 mL/min/{1.73_m2} — ABNORMAL LOW (ref >59)
GFR, EST NON AFRICAN AMERICAN: 49 mL/min/{1.73_m2} — AB (ref >59)
Glucose: 88 mg/dL (ref 65–99)
Potassium: 4.2 mmol/L (ref 3.5–5.2)
Sodium: 142 mmol/L (ref 134–144)

## 2017-02-06 NOTE — Telephone Encounter (Signed)
spoke with patient about recent labs.  patient verbalized understanding. patient thanked me for my call.

## 2017-03-25 ENCOUNTER — Other Ambulatory Visit: Payer: Self-pay | Admitting: Cardiology

## 2017-04-06 ENCOUNTER — Telehealth: Payer: Self-pay | Admitting: *Deleted

## 2017-04-06 NOTE — Telephone Encounter (Signed)
Patient presented to the office requesting samples of eliquis. Two weeks of eliquis provided to patient.

## 2017-04-08 ENCOUNTER — Telehealth: Payer: Self-pay | Admitting: Cardiology

## 2017-04-08 ENCOUNTER — Telehealth: Payer: Self-pay

## 2017-04-08 ENCOUNTER — Other Ambulatory Visit: Payer: Self-pay

## 2017-04-08 MED ORDER — APIXABAN 5 MG PO TABS: 5.0000 mg | ORAL_TABLET | Freq: Two times a day (BID) | ORAL | 3 refills | Status: DC

## 2017-04-08 NOTE — Telephone Encounter (Signed)
New message  Pt verbalized that she is calling for the rn  She turned in forms from [facility] for the rn on 04/06/2017 for Dr.Turner to sign and give back  So pt can get her Eliquis 5mg  credit on her medication

## 2017-04-08 NOTE — Telephone Encounter (Signed)
Patient is calling about her application for Eliquis. She dropped it off Monday 12/17. I have printed a Rx and placed it in a folder for Dr. Theodosia Blender sig. Left her a message to this affect.

## 2017-04-16 ENCOUNTER — Telehealth: Payer: Self-pay | Admitting: *Deleted

## 2017-04-16 NOTE — Telephone Encounter (Signed)
F/U Call:  Patient Calling to see if forms have been completed.

## 2017-04-16 NOTE — Telephone Encounter (Signed)
Received back the provider form from Dr Radford Pax signed for patient assistance [place] for [employer]. I have read prior notes regarding patient application being received here in office but Im unable to locate it. I will fax the provider portion with script to [facility], they may already have application.

## 2017-04-17 ENCOUNTER — Telehealth: Payer: Self-pay | Admitting: Cardiology

## 2017-04-17 NOTE — Telephone Encounter (Signed)
New Message   Patient is calling back about forms for Eliquis. Please call.

## 2017-04-20 NOTE — Telephone Encounter (Signed)
New message  Pt verbalized that she is calling for the rn about her forms

## 2017-04-20 NOTE — Telephone Encounter (Signed)
**Note De-Identified Samantha Henderson Obfuscation** The pt is advised that I have left her Eliquis pt assistance application and Eliquis RX in our front office and that she may pick up at her convenience. She states that she will pick up today.

## 2017-04-26 DIAGNOSIS — M25552 Pain in left hip: Secondary | ICD-10-CM | POA: Diagnosis not present

## 2017-04-26 DIAGNOSIS — M25512 Pain in left shoulder: Secondary | ICD-10-CM | POA: Diagnosis not present

## 2017-04-28 DIAGNOSIS — M25512 Pain in left shoulder: Secondary | ICD-10-CM | POA: Diagnosis not present

## 2017-04-28 DIAGNOSIS — S7002XA Contusion of left hip, initial encounter: Secondary | ICD-10-CM | POA: Diagnosis not present

## 2017-05-04 ENCOUNTER — Telehealth: Payer: Self-pay | Admitting: Cardiology

## 2017-05-04 MED ORDER — APIXABAN 2.5 MG PO TABS: 2.5000 mg | ORAL_TABLET | Freq: Two times a day (BID) | ORAL | 1 refills | Status: DC

## 2017-05-04 NOTE — Addendum Note (Signed)
Addended by: SUPPLE, MEGAN E on: 05/04/2017 04:19 PM   Modules accepted: Orders

## 2017-05-04 NOTE — Telephone Encounter (Signed)
°*  STAT* If patient is at the pharmacy, call can be transferred to refill team.   1. Which medications need to be refilled? (please list name of each medication and dose if known) Eliquis 5 mg   2. Which pharmacy/location (including street and city if local pharmacy) is medication to be sent to? [facility]   Fax # [phone].    3. Do they need a 30 day or 90 day supply? [place]

## 2017-05-04 NOTE — Telephone Encounter (Signed)
Pt now qualifies for lower dose of Eliquis 2.5mg  BID since she has turned 82 and her weight is < 60kg. Pt is aware of reasoning for decreasing her Eliquis dose. Refill sent in.

## 2017-05-06 ENCOUNTER — Telehealth: Payer: Self-pay | Admitting: Cardiology

## 2017-05-06 DIAGNOSIS — M0579 Rheumatoid arthritis with rheumatoid factor of multiple sites without organ or systems involvement: Secondary | ICD-10-CM | POA: Diagnosis not present

## 2017-05-06 MED ORDER — APIXABAN 2.5 MG PO TABS: 2.5000 mg | ORAL_TABLET | Freq: Two times a day (BID) | ORAL | 1 refills | Status: DC

## 2017-05-06 MED ORDER — APIXABAN 2.5 MG PO TABS: 2.5000 mg | ORAL_TABLET | Freq: Two times a day (BID) | ORAL | 3 refills | Status: DC

## 2017-05-06 NOTE — Telephone Encounter (Signed)
New Message  Pt c/o medication issue:  1. Name of Medication: eliquis   2. How are you currently taking this medication (dosage and times per day)? n/a  3. Are you having a reaction (difficulty breathing--STAT)? no  4. What is your medication issue? Pt call requesting to speak with RN. Pt would like RN to call [facility] to inform them of pts dosage change.

## 2017-05-06 NOTE — Telephone Encounter (Addendum)
Spoke with pt she states, had spoke with [facility] program and was tolled that this office need to give them the decreased dose of Eliquis 2.5 mg BID. (ph # [phone], fax # [phone]) Called the assistance program, spoke with Tomma Rakers witch states what  we need to do is fax the prescription with the decreased dose. I have attempted to print the prescription x 3  with no success.

## 2017-05-06 NOTE — Addendum Note (Signed)
Addended by: Carollee Sires L on: 05/06/2017 12:30 PM   Modules accepted: Orders

## 2017-05-07 MED ORDER — APIXABAN 2.5 MG PO TABS: 2.5000 mg | ORAL_TABLET | Freq: Two times a day (BID) | ORAL | 3 refills | Status: DC

## 2017-05-07 NOTE — Telephone Encounter (Signed)
Spoke with [employer] representative. She states patient application was missing a medicare ID #, which was given on yesterday. Confirmed if they received fax for prescription of eliquis. She states they received the fax but the order was not signed. Informed her I would fax a signed prescription to [phone]. She verbalized understanding and thanked me for the call.

## 2017-05-07 NOTE — Addendum Note (Signed)
Addended by: Teressa Senter on: 05/07/2017 03:41 PM   Modules accepted: Orders

## 2017-05-25 MED ORDER — DILTIAZEM HCL ER COATED BEADS 180 MG PO CP24: 180.0000 mg | ORAL_CAPSULE | Freq: Every day | ORAL | 2 refills | Status: DC

## 2017-05-25 NOTE — Telephone Encounter (Signed)
Pt called requesting that medication be resent to [facility] home delivery pharmacy. Pt insurance has changed. Resent medication as requested. Confirmation received.

## 2017-05-25 NOTE — Addendum Note (Signed)
Addended by: Derl Barrow on: 05/25/2017 01:50 PM   Modules accepted: Orders

## 2017-05-27 DIAGNOSIS — H40003 Preglaucoma, unspecified, bilateral: Secondary | ICD-10-CM | POA: Diagnosis not present

## 2017-05-27 DIAGNOSIS — H01009 Unspecified blepharitis unspecified eye, unspecified eyelid: Secondary | ICD-10-CM | POA: Diagnosis not present

## 2017-06-01 DIAGNOSIS — M25552 Pain in left hip: Secondary | ICD-10-CM | POA: Diagnosis not present

## 2017-06-08 DIAGNOSIS — M25552 Pain in left hip: Secondary | ICD-10-CM | POA: Diagnosis not present

## 2017-06-29 DIAGNOSIS — M154 Erosive (osteo)arthritis: Secondary | ICD-10-CM | POA: Diagnosis not present

## 2017-06-29 DIAGNOSIS — M2550 Pain in unspecified joint: Secondary | ICD-10-CM | POA: Diagnosis not present

## 2017-06-29 DIAGNOSIS — M0579 Rheumatoid arthritis with rheumatoid factor of multiple sites without organ or systems involvement: Secondary | ICD-10-CM | POA: Diagnosis not present

## 2017-06-29 DIAGNOSIS — M1991 Primary osteoarthritis, unspecified site: Secondary | ICD-10-CM | POA: Diagnosis not present

## 2017-06-29 DIAGNOSIS — M329 Systemic lupus erythematosus, unspecified: Secondary | ICD-10-CM | POA: Diagnosis not present

## 2017-06-29 DIAGNOSIS — Z6820 Body mass index (BMI) 20.0-20.9, adult: Secondary | ICD-10-CM | POA: Diagnosis not present

## 2017-06-29 DIAGNOSIS — M3500 Sicca syndrome, unspecified: Secondary | ICD-10-CM | POA: Diagnosis not present

## 2017-06-29 DIAGNOSIS — Z79899 Other long term (current) drug therapy: Secondary | ICD-10-CM | POA: Diagnosis not present

## 2017-06-29 DIAGNOSIS — M545 Low back pain: Secondary | ICD-10-CM | POA: Diagnosis not present

## 2017-07-01 DIAGNOSIS — I10 Essential (primary) hypertension: Secondary | ICD-10-CM | POA: Diagnosis not present

## 2017-07-01 DIAGNOSIS — E039 Hypothyroidism, unspecified: Secondary | ICD-10-CM | POA: Diagnosis not present

## 2017-07-01 DIAGNOSIS — E559 Vitamin D deficiency, unspecified: Secondary | ICD-10-CM | POA: Diagnosis not present

## 2017-07-01 DIAGNOSIS — E7800 Pure hypercholesterolemia, unspecified: Secondary | ICD-10-CM | POA: Diagnosis not present

## 2017-07-01 DIAGNOSIS — D696 Thrombocytopenia, unspecified: Secondary | ICD-10-CM | POA: Diagnosis not present

## 2017-07-02 DIAGNOSIS — I10 Essential (primary) hypertension: Secondary | ICD-10-CM | POA: Diagnosis not present

## 2017-07-02 DIAGNOSIS — G479 Sleep disorder, unspecified: Secondary | ICD-10-CM | POA: Diagnosis not present

## 2017-07-02 DIAGNOSIS — E039 Hypothyroidism, unspecified: Secondary | ICD-10-CM | POA: Diagnosis not present

## 2017-07-02 DIAGNOSIS — M8580 Other specified disorders of bone density and structure, unspecified site: Secondary | ICD-10-CM | POA: Diagnosis not present

## 2017-07-02 DIAGNOSIS — E559 Vitamin D deficiency, unspecified: Secondary | ICD-10-CM | POA: Diagnosis not present

## 2017-07-02 DIAGNOSIS — Z0000 Encounter for general adult medical examination without abnormal findings: Secondary | ICD-10-CM | POA: Diagnosis not present

## 2017-07-02 DIAGNOSIS — R69 Illness, unspecified: Secondary | ICD-10-CM | POA: Diagnosis not present

## 2017-07-02 DIAGNOSIS — E7800 Pure hypercholesterolemia, unspecified: Secondary | ICD-10-CM | POA: Diagnosis not present

## 2017-07-02 DIAGNOSIS — K219 Gastro-esophageal reflux disease without esophagitis: Secondary | ICD-10-CM | POA: Diagnosis not present

## 2017-07-02 DIAGNOSIS — Z1389 Encounter for screening for other disorder: Secondary | ICD-10-CM | POA: Diagnosis not present

## 2017-07-15 ENCOUNTER — Ambulatory Visit (HOSPITAL_COMMUNITY): Payer: Medicare HMO | Attending: Cardiology

## 2017-07-15 ENCOUNTER — Other Ambulatory Visit: Payer: Self-pay

## 2017-07-15 DIAGNOSIS — E039 Hypothyroidism, unspecified: Secondary | ICD-10-CM | POA: Diagnosis not present

## 2017-07-15 DIAGNOSIS — I10 Essential (primary) hypertension: Secondary | ICD-10-CM | POA: Insufficient documentation

## 2017-07-15 DIAGNOSIS — I313 Pericardial effusion (noninflammatory): Secondary | ICD-10-CM | POA: Diagnosis not present

## 2017-07-15 DIAGNOSIS — I083 Combined rheumatic disorders of mitral, aortic and tricuspid valves: Secondary | ICD-10-CM | POA: Insufficient documentation

## 2017-07-15 DIAGNOSIS — I272 Pulmonary hypertension, unspecified: Secondary | ICD-10-CM | POA: Diagnosis not present

## 2017-07-15 DIAGNOSIS — I351 Nonrheumatic aortic (valve) insufficiency: Secondary | ICD-10-CM

## 2017-07-20 DIAGNOSIS — R69 Illness, unspecified: Secondary | ICD-10-CM | POA: Diagnosis not present

## 2017-07-22 DIAGNOSIS — Z1231 Encounter for screening mammogram for malignant neoplasm of breast: Secondary | ICD-10-CM | POA: Diagnosis not present

## 2017-07-29 ENCOUNTER — Ambulatory Visit: Payer: Medicare HMO | Admitting: Cardiology

## 2017-07-29 ENCOUNTER — Encounter: Payer: Self-pay | Admitting: Cardiology

## 2017-07-29 VITALS — BP 124/58 | HR 47 | Wt 129.8 lb

## 2017-07-29 DIAGNOSIS — I10 Essential (primary) hypertension: Secondary | ICD-10-CM | POA: Diagnosis not present

## 2017-07-29 DIAGNOSIS — I351 Nonrheumatic aortic (valve) insufficiency: Secondary | ICD-10-CM | POA: Diagnosis not present

## 2017-07-29 DIAGNOSIS — I481 Persistent atrial fibrillation: Secondary | ICD-10-CM

## 2017-07-29 DIAGNOSIS — I4819 Other persistent atrial fibrillation: Secondary | ICD-10-CM

## 2017-07-29 NOTE — Patient Instructions (Signed)
Medication Instructions:  Your physician recommends that you continue on your current medications as directed. Please refer to the Current Medication list given to you today.  If you need a refill on your cardiac medications, please contact your pharmacy first.  Labwork: None ordered   Testing/Procedures: None ordered   Follow-Up: Your physician wants you to follow-up in: 6 months with Dr. Turner. You will receive a reminder letter in the mail two months in advance. If you don't receive a letter, please call our office to schedule the follow-up appointment.  Any Other Special Instructions Will Be Listed Below (If Applicable).   Thank you for choosing [facility]    Rena Taralyn Ferraiolo, RN  [phone]  If you need a refill on your cardiac medications before your next appointment, please call your pharmacy.   

## 2017-07-29 NOTE — Progress Notes (Signed)
Cardiology Office Note:    Date:  07/29/2017   ID:  Samantha Henderson, DOB Sep 07, 1934, MRN [id number]  PCP:  Leighton Ruff, MD  Cardiologist:  No primary care provider on file.    Referring MD: Leighton Ruff, MD   Chief Complaint  Patient presents with  . Atrial Fibrillation  . Hypertension  . Aortic Insuffiency    History of Present Illness:    Samantha Henderson is a 83 y.o. female with a hx of persistent atrial fibrillation, moderate AR by 2D echocardiogram 06/10/2016, mild MR and HTN.  She is here today for followup and is doing well.  She denies any chest pain or pressure, SOB, DOE, PND, orthopnea, LE edema, dizziness or syncope.  She occasionally complains of palpitations but these are very infrequent and are short-lived.  She is compliant with her meds and is tolerating meds with no SE.     Past Medical History:  Diagnosis Date  . Abdominal bruit    Abdominal bruit in the past no aortic aneurysm by evaluation in the past  . Aortic regurgitation    Moderate by echo 05/2016  . Arm weakness    Arm weakness with exercise, January, 2012  . Benign essential HTN 01/27/2017  . Bradycardia   . Constipation    Constipation with diltiazem March, 2012  . Diastolic dysfunction    Mild  . Dizziness   . Ejection fraction 2011   55-60% ejection fraction, echo, January, 2012  . HTN (hypertension)   . Hypothyroidism   . Leg cramps    from lipitor in the past  . Mitral regurgitation    echo..04/2007  /  mild..04/2010  . PAC (premature atrial contraction)   . Palpitation   . Pericardial effusion    small...posterior...echo..04/2007  . Polymyalgia ([place])   . Pulmonary hypertension ([place])   . Rheumatic fever    Questionable history of rheumatic fever, but no history of valvular abnormalities  . Sinus tachycardia   . Temporal arteritis ([facility])    Rule out temporal arteritis in the past  . Tricuspid regurgitation    -moderate...echo..04/2007...Marland KitchenMarland KitchenEF  60%..echo..04/2007  /    55-60%....echo..05/21/2010    Past Surgical History:  Procedure Laterality Date  . BACK SURGERY    . BUNIONECTOMY    . HIP PINNING,CANNULATED Left 04/12/2016   Procedure: CANNULATED HIP PINNING;  Surgeon: Marchia Bond, MD;  Location: [facility];  Service: Orthopedics;  Laterality: Left;  . NECK SURGERY    . TOTAL ABDOMINAL HYSTERECTOMY      Current Medications: Current Meds  Medication Sig  . Acetaminophen (TYLENOL PO) Take 650 mg by mouth as needed.  Marland Kitchen apixaban (ELIQUIS) 2.5 MG TABS tablet Take 1 tablet (2.5 mg total) by mouth 2 (two) times daily.  Marland Kitchen atorvastatin (LIPITOR) 20 MG tablet Take 20 mg by mouth daily.   Marland Kitchen BIOTIN PO Take 500 mg by mouth daily.  . Cholecalciferol (VITAMIN D3) 1000 UNITS CAPS Take 1 capsule by mouth daily. 1 tab po qd  . diltiazem (CARDIZEM CD) 180 MG 24 hr capsule Take 1 capsule (180 mg total) by mouth daily.  Marland Kitchen diltiazem (CARDIZEM) 30 MG tablet Take 1 tablet (30 mg total) by mouth daily as needed. For breakthrough atrial fibrillation.  . fish oil-omega-3 fatty acids 1000 MG capsule Take 1 g by mouth daily.    . hydrochlorothiazide 25 MG tablet Take 25 mg by mouth daily.   Marland Kitchen levothyroxine (SYNTHROID, LEVOTHROID) 112 MCG tablet Take 112 mcg by mouth daily  before breakfast.  . methotrexate (RHEUMATREX) 2.5 MG tablet TK 6 TS PO AT THE SAME TIME ONCE A WK  . omeprazole (PRILOSEC OTC) 20 MG tablet Take 20 mg by mouth 2 (two) times daily.  . polyethylene glycol (MIRALAX / GLYCOLAX) packet Take 17 g by mouth daily.  . potassium chloride SA (K-DUR,KLOR-CON) 20 MEQ tablet Take 1 tablet (20 mEq total) by mouth daily.  . predniSONE (DELTASONE) 1 MG tablet Take 3 mg by mouth daily with breakfast.   . sennosides-docusate sodium (SENOKOT-S) 8.6-50 MG tablet Take 2 tablets by mouth 2 (two) times daily.  . traMADol (ULTRAM) 50 MG tablet Take 1 tablet (50 mg total) by mouth every 6 (six) hours as needed.  . Turmeric 500 MG CAPS Take 500 mg by mouth daily.     Allergies:    Cephalexin; Ciprofloxacin; Lisinopril; Sulfamethoxazole-trimethoprim; and Sulfonamide derivatives   Social History   Socioeconomic History  . Marital status: Widowed    Spouse name: Not on file  . Number of children: Not on file  . Years of education: Not on file  . Highest education level: Not on file  Occupational History  . Not on file  Social Needs  . Financial resource strain: Not on file  . Food insecurity:    Worry: Not on file    Inability: Not on file  . Transportation needs:    Medical: Not on file    Non-medical: Not on file  Tobacco Use  . Smoking status: Never Smoker  . Smokeless tobacco: Never Used  Substance and Sexual Activity  . Alcohol use: No  . Drug use: No  . Sexual activity: Not on file  Lifestyle  . Physical activity:    Days per week: Not on file    Minutes per session: Not on file  . Stress: Not on file  Relationships  . Social connections:    Talks on phone: Not on file    Gets together: Not on file    Attends religious service: Not on file    Active member of club or organization: Not on file    Attends meetings of clubs or organizations: Not on file    Relationship status: Not on file  Other Topics Concern  . Not on file  Social History Narrative  . Not on file     Family History: The patient's family history includes Heart disease in her mother; Stroke in her mother.  ROS:   Please see the history of present illness.    ROS  All other systems reviewed and negative.   EKGs/Labs/Other Studies Reviewed:    The following studies were reviewed today: none  EKG:  EKG is  ordered today and showed NSR with sinus arrhythmia and septal infarct  Recent Labs: 01/27/2017: Hemoglobin 12.6; Platelets CANCELED 02/06/2017: BUN 17; Creatinine, Ser 1.08; Potassium 4.2; Sodium 142   Recent Lipid Panel No results found for: CHOL, TRIG, HDL, CHOLHDL, VLDL, LDLCALC, LDLDIRECT  Physical Exam:    VS:  BP (!) 124/58   Pulse (!) 47   Wt 129 lb  12.8 oz (58.9 kg)   SpO2 93%   BMI 20.95 kg/m     Wt Readings from Last 3 Encounters:  07/29/17 129 lb 12.8 oz (58.9 kg)  01/27/17 126 lb 6.4 oz (57.3 kg)  07/01/16 119 lb (54 kg)     GEN:  Well nourished, well developed in no acute distress HEENT: Normal NECK: No JVD; No carotid bruits LYMPHATICS: No lymphadenopathy CARDIAC:  RRR, no murmurs, rubs, gallops RESPIRATORY:  Clear to auscultation without rales, wheezing or rhonchi  ABDOMEN: Soft, non-tender, non-distended MUSCULOSKELETAL:  No edema; No deformity  SKIN: Warm and dry NEUROLOGIC:  Alert and oriented x 3 PSYCHIATRIC:  Normal affect   ASSESSMENT:    1. Persistent atrial fibrillation ([place])   2. Benign essential HTN   3. Nonrheumatic aortic valve insufficiency    PLAN:    In order of problems listed above:  1.  Persistent atrial fibrillation -she is maintaining normal sinus bradycardia on exam today.  EKG shows sinus rhythm and sinus bradycardia with significant sinus arrhythmia.  Heart rate varies anywhere from 47-60 bpm.  She will continue on Cardizem 180 mg daily for suppression and as needed short acting Cardizem for breakthrough palpitations.  She will also continue on Cardizem CD 180 mg daily.  She has had no bleeding problems and will continue on Eliquis 2.5 mg twice daily which is dosed based on age of 83yo and weight of less than 60 kg.    2.  HTN - blood pressure is well controlled on exam today.  She will continue on Cardizem CD 180 mg daily and HCTZ 25 mg daily.  3.  Moderate AR - she is having a repeat 2D echocardiogram today to follow-up to make sure that this is stable.   Medication Adjustments/Labs and Tests Ordered: Current medicines are reviewed at length with the patient today.  Concerns regarding medicines are outlined above.  Orders Placed This Encounter  Procedures  . EKG 12-Lead   No orders of the defined types were placed in this encounter.   Signed, Fransico Him, MD  07/29/2017 9:49 AM     [place]

## 2017-08-25 ENCOUNTER — Telehealth: Payer: Self-pay

## 2017-08-25 DIAGNOSIS — I351 Nonrheumatic aortic (valve) insufficiency: Secondary | ICD-10-CM

## 2017-08-25 NOTE — Telephone Encounter (Signed)
Notes recorded by Teressa Senter, RN on 08/25/2017 at 1:07 PM EDT Patient made aware of echo results. She verbalized understanding and thankful for the call. Copy mailed per patient's request. Echo ordered to be scheduled in 1 year.   Notes recorded by Sueanne Margarita, MD on 08/24/2017 at 5:30 PM EDT Normal LVF with increased stiffness of heart muscle normal for her age, moderate AR, mild MR, moderate TR - no change from prior echo- repeat echo in 1 year to followup on AR

## 2017-09-15 DIAGNOSIS — M8580 Other specified disorders of bone density and structure, unspecified site: Secondary | ICD-10-CM | POA: Diagnosis not present

## 2017-09-15 DIAGNOSIS — Z1389 Encounter for screening for other disorder: Secondary | ICD-10-CM | POA: Diagnosis not present

## 2017-09-15 DIAGNOSIS — E039 Hypothyroidism, unspecified: Secondary | ICD-10-CM | POA: Diagnosis not present

## 2017-09-15 DIAGNOSIS — K219 Gastro-esophageal reflux disease without esophagitis: Secondary | ICD-10-CM | POA: Diagnosis not present

## 2017-09-15 DIAGNOSIS — H40003 Preglaucoma, unspecified, bilateral: Secondary | ICD-10-CM | POA: Diagnosis not present

## 2017-09-15 DIAGNOSIS — G479 Sleep disorder, unspecified: Secondary | ICD-10-CM | POA: Diagnosis not present

## 2017-09-15 DIAGNOSIS — I10 Essential (primary) hypertension: Secondary | ICD-10-CM | POA: Diagnosis not present

## 2017-09-15 DIAGNOSIS — E559 Vitamin D deficiency, unspecified: Secondary | ICD-10-CM | POA: Diagnosis not present

## 2017-09-15 DIAGNOSIS — E7800 Pure hypercholesterolemia, unspecified: Secondary | ICD-10-CM | POA: Diagnosis not present

## 2017-09-15 DIAGNOSIS — Z0000 Encounter for general adult medical examination without abnormal findings: Secondary | ICD-10-CM | POA: Diagnosis not present

## 2017-09-15 DIAGNOSIS — R69 Illness, unspecified: Secondary | ICD-10-CM | POA: Diagnosis not present

## 2017-09-29 DIAGNOSIS — Z79899 Other long term (current) drug therapy: Secondary | ICD-10-CM | POA: Diagnosis not present

## 2017-09-29 DIAGNOSIS — Z6820 Body mass index (BMI) 20.0-20.9, adult: Secondary | ICD-10-CM | POA: Diagnosis not present

## 2017-09-29 DIAGNOSIS — M329 Systemic lupus erythematosus, unspecified: Secondary | ICD-10-CM | POA: Diagnosis not present

## 2017-09-29 DIAGNOSIS — M2550 Pain in unspecified joint: Secondary | ICD-10-CM | POA: Diagnosis not present

## 2017-09-29 DIAGNOSIS — M0579 Rheumatoid arthritis with rheumatoid factor of multiple sites without organ or systems involvement: Secondary | ICD-10-CM | POA: Diagnosis not present

## 2017-09-29 DIAGNOSIS — M1991 Primary osteoarthritis, unspecified site: Secondary | ICD-10-CM | POA: Diagnosis not present

## 2017-09-29 DIAGNOSIS — M154 Erosive (osteo)arthritis: Secondary | ICD-10-CM | POA: Diagnosis not present

## 2017-10-01 ENCOUNTER — Ambulatory Visit (INDEPENDENT_AMBULATORY_CARE_PROVIDER_SITE_OTHER): Payer: Medicare HMO | Admitting: Ophthalmology

## 2017-10-01 DIAGNOSIS — H35033 Hypertensive retinopathy, bilateral: Secondary | ICD-10-CM | POA: Diagnosis not present

## 2017-10-01 DIAGNOSIS — I10 Essential (primary) hypertension: Secondary | ICD-10-CM

## 2017-10-01 DIAGNOSIS — H43813 Vitreous degeneration, bilateral: Secondary | ICD-10-CM | POA: Diagnosis not present

## 2017-10-01 DIAGNOSIS — H33302 Unspecified retinal break, left eye: Secondary | ICD-10-CM | POA: Diagnosis not present

## 2017-10-01 DIAGNOSIS — H2703 Aphakia, bilateral: Secondary | ICD-10-CM

## 2017-10-01 DIAGNOSIS — M069 Rheumatoid arthritis, unspecified: Secondary | ICD-10-CM

## 2017-10-01 DIAGNOSIS — H26493 Other secondary cataract, bilateral: Secondary | ICD-10-CM | POA: Diagnosis not present

## 2017-10-12 ENCOUNTER — Encounter (INDEPENDENT_AMBULATORY_CARE_PROVIDER_SITE_OTHER): Payer: Medicare HMO | Admitting: Ophthalmology

## 2017-10-12 DIAGNOSIS — H2702 Aphakia, left eye: Secondary | ICD-10-CM

## 2017-10-20 ENCOUNTER — Telehealth: Payer: Self-pay

## 2017-10-20 NOTE — Telephone Encounter (Signed)
**Note De-Identified [name] Obfuscation** Letter received Samantha Henderson fax from [place] stating that the pt has been approved for pt assistance with Eliquis. Approval good until 67/34/1935 Application Case# [id number]

## 2017-12-07 DIAGNOSIS — E039 Hypothyroidism, unspecified: Secondary | ICD-10-CM | POA: Diagnosis not present

## 2018-01-19 DIAGNOSIS — L570 Actinic keratosis: Secondary | ICD-10-CM | POA: Diagnosis not present

## 2018-01-19 DIAGNOSIS — L820 Inflamed seborrheic keratosis: Secondary | ICD-10-CM | POA: Diagnosis not present

## 2018-01-19 DIAGNOSIS — L821 Other seborrheic keratosis: Secondary | ICD-10-CM | POA: Diagnosis not present

## 2018-01-19 DIAGNOSIS — D3612 Benign neoplasm of peripheral nerves and autonomic nervous system, upper limb, including shoulder: Secondary | ICD-10-CM | POA: Diagnosis not present

## 2018-01-21 ENCOUNTER — Telehealth: Payer: Self-pay | Admitting: Cardiology

## 2018-01-21 DIAGNOSIS — R69 Illness, unspecified: Secondary | ICD-10-CM | POA: Diagnosis not present

## 2018-01-21 NOTE — Telephone Encounter (Signed)
New Message          Pt c/o medication issue:  1. Name of Medication: Eliquis 2.5  2. How are you currently taking this medication (dosage and times per day)? 1 pill x 2 a day  3. Are you having a reaction (difficulty breathing--STAT)? No   4. What is your medication issue? Need a Rx change to 2.5 from 5 mg Eliquis Fax # is [phone]/[facility]

## 2018-01-22 MED ORDER — APIXABAN 2.5 MG PO TABS: 2.5000 mg | ORAL_TABLET | Freq: Two times a day (BID) | ORAL | 3 refills | Status: DC

## 2018-01-22 NOTE — Telephone Encounter (Signed)
Patient is requesting that Samantha Henderson change her medication dose from 5 mg to 2.5 mg. Sending to Pharmacy.

## 2018-01-25 NOTE — Telephone Encounter (Signed)
Will route to our team that helps with patient assistance since pt is receiving medication from [facility]. They should have her paperwork with hard copy rx attached as part of her application form. She does qualify for lower Eliquis 2.5mg  BID dose since age > 16 and weight < 60kg.

## 2018-01-27 ENCOUNTER — Other Ambulatory Visit: Payer: Self-pay

## 2018-01-27 MED ORDER — APIXABAN 2.5 MG PO TABS: 2.5000 mg | ORAL_TABLET | Freq: Two times a day (BID) | ORAL | 3 refills | Status: DC

## 2018-01-27 NOTE — Telephone Encounter (Signed)
**Note De-Identified Georgianna Band Obfuscation** I have printed a new Eliquis RX 2.5 mg RX #180 with 3 refills, Dr Radford Pax has signed it, and I have faxed it to [facility] with a note on the cover letter stating that there has been an Eliquis dose change from 5 mg to 2.5 mg BID.

## 2018-02-09 ENCOUNTER — Telehealth: Payer: Self-pay

## 2018-02-09 NOTE — Telephone Encounter (Signed)
We received an Adverse Event Report Questionnaire from [facility] concerning the dose change in the pts Eliquis dose from 5 mg to 2.5 mg due to the pt being 82 yrs. old and she weighs less than 60 kg.  I have completed the form and placed it in Dr Landis Gandy mail bin awaiting her signature.

## 2018-02-10 ENCOUNTER — Other Ambulatory Visit: Payer: Self-pay | Admitting: Cardiology

## 2018-02-10 DIAGNOSIS — Z23 Encounter for immunization: Secondary | ICD-10-CM | POA: Diagnosis not present

## 2018-02-15 NOTE — Progress Notes (Signed)
Cardiology Office Note:    Date:  02/16/2018   ID:  Samantha Henderson, DOB 04-Mar-1935, MRN [id number]  PCP:  Leighton Ruff, MD  Cardiologist:  No primary care provider on file.    Referring MD: Leighton Ruff, MD   Chief Complaint  Patient presents with  . Atrial Fibrillation  . Aortic Insuffiency  . Hypertension    History of Present Illness:    Samantha Henderson is a 83 y.o. female with a hx of persistent atrial fibrillation,moderate AR by 2D echocardiogram 06/10/2016, mild MR and HTN.  She is here today for followup and is doing well.  She denies any chest pain or pressure, SOB, DOE, PND, orthopnea, LE edema, dizziness, palpitations or syncope. She is compliant with her meds and is tolerating meds with no SE.    Past Medical History:  Diagnosis Date  . Abdominal bruit    Abdominal bruit in the past no aortic aneurysm by evaluation in the past  . Aortic regurgitation    Moderate by echo 05/2016  . Arm weakness    Arm weakness with exercise, January, 2012  . Benign essential HTN 01/27/2017  . Bradycardia   . Constipation    Constipation with diltiazem March, 2012  . Diastolic dysfunction    Mild  . Dizziness   . Ejection fraction 2011   55-60% ejection fraction, echo, January, 2012  . HTN (hypertension)   . Hypothyroidism   . Leg cramps    from lipitor in the past  . Mitral regurgitation    echo..04/2007  /  mild..04/2010  . PAC (premature atrial contraction)   . Palpitation   . Pericardial effusion    small...posterior...echo..04/2007  . Polymyalgia ([place])   . Pulmonary hypertension ([place])   . Rheumatic fever    Questionable history of rheumatic fever, but no history of valvular abnormalities  . Sinus tachycardia   . Temporal arteritis ([facility])    Rule out temporal arteritis in the past  . Tricuspid regurgitation    -moderate...echo..04/2007...Marland KitchenMarland KitchenEF  60%..echo..04/2007  /   55-60%....echo..05/21/2010    Past Surgical History:  Procedure Laterality Date  . BACK SURGERY      . BUNIONECTOMY    . HIP PINNING,CANNULATED Left 04/12/2016   Procedure: CANNULATED HIP PINNING;  Surgeon: Marchia Bond, MD;  Location: [facility];  Service: Orthopedics;  Laterality: Left;  . NECK SURGERY    . TOTAL ABDOMINAL HYSTERECTOMY      Current Medications: Current Meds  Medication Sig  . Acetaminophen (TYLENOL PO) Take 650 mg by mouth as needed.  Marland Kitchen apixaban (ELIQUIS) 2.5 MG TABS tablet Take 1 tablet (2.5 mg total) by mouth 2 (two) times daily.  Marland Kitchen atorvastatin (LIPITOR) 20 MG tablet Take 20 mg by mouth daily.   Marland Kitchen BIOTIN PO Take 500 mg by mouth daily.  . Cholecalciferol (VITAMIN D3) 1000 UNITS CAPS Take 1 capsule by mouth daily. 1 tab po qd  . diltiazem (CARDIZEM CD) 180 MG 24 hr capsule TAKE 1 CAPSULE DAILY  . diltiazem (CARDIZEM) 30 MG tablet Take 1 tablet (30 mg total) by mouth daily as needed. For breakthrough atrial fibrillation.  . fish oil-omega-3 fatty acids 1000 MG capsule Take 1 g by mouth daily.    . hydrochlorothiazide 25 MG tablet Take 25 mg by mouth daily.   Marland Kitchen levothyroxine (SYNTHROID, LEVOTHROID) 112 MCG tablet Take 112 mcg by mouth daily before breakfast.  . omeprazole (PRILOSEC OTC) 20 MG tablet Take 20 mg by mouth 2 (two) times daily.  Marland Kitchen  polyethylene glycol (MIRALAX / GLYCOLAX) packet Take 17 g by mouth daily.  . potassium chloride SA (K-DUR,KLOR-CON) 20 MEQ tablet Take 1 tablet (20 mEq total) by mouth daily.  . predniSONE (DELTASONE) 1 MG tablet Take 3 mg by mouth daily with breakfast.   . sennosides-docusate sodium (SENOKOT-S) 8.6-50 MG tablet Take 2 tablets by mouth 2 (two) times daily.  . traMADol (ULTRAM) 50 MG tablet Take 1 tablet (50 mg total) by mouth every 6 (six) hours as needed.  . Turmeric 500 MG CAPS Take 500 mg by mouth daily.     Allergies:   Cephalexin; Ciprofloxacin; Lisinopril; Sulfamethoxazole-trimethoprim; and Sulfonamide derivatives   Social History   Socioeconomic History  . Marital status: Widowed    Spouse name: Not on file  . Number  of children: Not on file  . Years of education: Not on file  . Highest education level: Not on file  Occupational History  . Not on file  Social Needs  . Financial resource strain: Not on file  . Food insecurity:    Worry: Not on file    Inability: Not on file  . Transportation needs:    Medical: Not on file    Non-medical: Not on file  Tobacco Use  . Smoking status: Never Smoker  . Smokeless tobacco: Never Used  Substance and Sexual Activity  . Alcohol use: No  . Drug use: No  . Sexual activity: Not on file  Lifestyle  . Physical activity:    Days per week: Not on file    Minutes per session: Not on file  . Stress: Not on file  Relationships  . Social connections:    Talks on phone: Not on file    Gets together: Not on file    Attends religious service: Not on file    Active member of club or organization: Not on file    Attends meetings of clubs or organizations: Not on file    Relationship status: Not on file  Other Topics Concern  . Not on file  Social History Narrative  . Not on file     Family History: The patient's family history includes Heart disease in her mother; Stroke in her mother.  ROS:   Please see the history of present illness.    ROS  All other systems reviewed and negative.   EKGs/Labs/Other Studies Reviewed:    The following studies were reviewed today: none  EKG:  EKG is not ordered today.    Recent Labs: No results found for requested labs within last 8760 hours.   Recent Lipid Panel No results found for: CHOL, TRIG, HDL, CHOLHDL, VLDL, LDLCALC, LDLDIRECT  Physical Exam:    VS:  BP 134/72   Pulse (!) 56   Ht 5\' 6"  (1.676 m)   Wt 133 lb 12.8 oz (60.7 kg)   SpO2 99%   BMI 21.60 kg/m     Wt Readings from Last 3 Encounters:  02/16/18 133 lb 12.8 oz (60.7 kg)  07/29/17 129 lb 12.8 oz (58.9 kg)  01/27/17 126 lb 6.4 oz (57.3 kg)     GEN:  Well nourished, well developed in no acute distress HEENT: Normal NECK: No JVD; No  carotid bruits LYMPHATICS: No lymphadenopathy CARDIAC: RRR, no murmurs, rubs, gallops RESPIRATORY:  Clear to auscultation without rales, wheezing or rhonchi  ABDOMEN: Soft, non-tender, non-distended MUSCULOSKELETAL:  No edema; No deformity  SKIN: Warm and dry NEUROLOGIC:  Alert and oriented x 3 PSYCHIATRIC:  Normal affect  ASSESSMENT:    1. Persistent atrial fibrillation ([employer])   2. Benign essential HTN   3. Nonrheumatic aortic valve insufficiency    PLAN:    In order of problems listed above:  1.  Persistent atrial fibrillation - she remains in NSR on exam today.  She will continue on Cardizem for afib suppression.  She will continue on Eliquis dosed at 2.5mg  BID for age > 54 and weight has been anywhere from 59-60kg.  Creatinine was 1.01 and potassium 3.6 on 07/01/2017 hemoglobin 14.2.  I will repeat a bmet and hemoglobin today.  2.  HTN - BP is controlled on exam today.  She will continue on Cardizem CD 180mg  daily and HCTZ 25mg  daily.  3.  Aortic insufficiency - moderate by echo 06/2017 with normal LV size and function.  I will repeat an echo 06/2018 to make sure AR has not progressed.    Medication Adjustments/Labs and Tests Ordered: Current medicines are reviewed at length with the patient today.  Concerns regarding medicines are outlined above.  No orders of the defined types were placed in this encounter.  No orders of the defined types were placed in this encounter.   Signed, Fransico Him, MD  02/16/2018 8:27 AM    [place]

## 2018-02-16 ENCOUNTER — Ambulatory Visit: Payer: Medicare HMO | Admitting: Cardiology

## 2018-02-16 ENCOUNTER — Encounter: Payer: Self-pay | Admitting: Cardiology

## 2018-02-16 VITALS — BP 134/72 | HR 56 | Ht 66.0 in | Wt 133.8 lb

## 2018-02-16 DIAGNOSIS — I10 Essential (primary) hypertension: Secondary | ICD-10-CM

## 2018-02-16 DIAGNOSIS — I351 Nonrheumatic aortic (valve) insufficiency: Secondary | ICD-10-CM

## 2018-02-16 DIAGNOSIS — I4819 Other persistent atrial fibrillation: Secondary | ICD-10-CM | POA: Diagnosis not present

## 2018-02-16 NOTE — Patient Instructions (Signed)
Medication Instructions:  Your physician recommends that you continue on your current medications as directed. Please refer to the Current Medication list given to you today.  If you need a refill on your cardiac medications before your next appointment, please call your pharmacy.   Lab work: Today: BMET and CBC  If you have labs (blood work) drawn today and your tests are completely normal, you will receive your results only by: Marland Kitchen MyChart Message (if you have MyChart) OR . A paper copy in the mail If you have any lab test that is abnormal or we need to change your treatment, we will call you to review the results.  Testing/Procedures: Your physician has requested that you have an echocardiogram around 06/2018. Echocardiography is a painless test that uses sound waves to create images of your heart. It provides your doctor with information about the size and shape of your heart and how well your heart's chambers and valves are working. This procedure takes approximately one hour. There are no restrictions for this procedure.  Follow-Up: At [facility], you and your health needs are our priority.  As part of our continuing mission to provide you with exceptional heart care, we have created designated Provider Care Teams.  These Care Teams include your primary Cardiologist (physician) and Advanced Practice Providers (APPs -  Physician Assistants and Nurse Practitioners) who all work together to provide you with the care you need, when you need it.  Your physician wants you to follow-up in: 6 month with PA. Please call our office to schedule the follow-up appointment.   You will need a follow up appointment in 1 years.  Please call our office 2 months in advance to schedule this appointment.  You may see Dr. Radford Pax or one of the following Advanced Practice Providers on your designated Care Team:   [place], PA-C Melina Copa, PA-C . Ermalinda Barrios, PA-C

## 2018-02-17 LAB — BASIC METABOLIC PANEL
BUN/Creatinine Ratio: 12 (ref 12–28)
BUN: 14 mg/dL (ref 8–27)
CALCIUM: 9 mg/dL (ref 8.7–10.3)
CHLORIDE: 92 mmol/L — AB (ref 96–106)
CO2: 25 mmol/L (ref 20–29)
Creatinine, Ser: 1.18 mg/dL — ABNORMAL HIGH (ref 0.57–1.00)
GFR calc non Af Amer: 43 mL/min/{1.73_m2} — ABNORMAL LOW (ref >59)
GFR, EST AFRICAN AMERICAN: 50 mL/min/{1.73_m2} — AB (ref >59)
Glucose: 73 mg/dL (ref 65–99)
Potassium: 3.4 mmol/L — ABNORMAL LOW (ref 3.5–5.2)
Sodium: 133 mmol/L — ABNORMAL LOW (ref 134–144)

## 2018-02-17 LAB — CBC
Hematocrit: 38.3 % (ref 34.0–46.6)
Hemoglobin: 12.7 g/dL (ref 11.1–15.9)
MCH: 30.1 pg (ref 26.6–33.0)
MCHC: 33.2 g/dL (ref 31.5–35.7)
MCV: 91 fL (ref 79–97)
RBC: 4.22 x10E6/uL (ref 3.77–5.28)
RDW: 12.6 % (ref 12.3–15.4)
WBC: 9.1 10*3/uL (ref 3.4–10.8)

## 2018-02-21 NOTE — Progress Notes (Signed)
Name: Samantha Henderson  MRN/ DOB: [id number], August 03, 1934    Age/ Sex: 83 y.o., female    PCP: Leighton Ruff, MD   Reason for Endocrinology Evaluation: Hypothyroidism     Date of Initial Endocrinology Evaluation: 02/22/2018     HPI: Samantha Henderson is a 83 y.o. female with a past medical history of HTN, temporal arteritis and A.Fib . The patient presented for initial endocrinology clinic visit on 02/22/2018 for consultative assistance with her Hypothyroidism .   She is S/P surgical hypothyroidism in 1961 due to [place]. She was always on Levothyroxine 112 mcg up until this year, when her doses have been gradually reduced. Currently  on 50 mcg daily.  Since the reduction she has noted weight gain and insomnia.  Denies locak neck symptoms such as swelling, pain, voice hoarseness or dysphagia.  She uses tums  On occasions   Has been on Biotin for the past 3 years.   FH : Significant for a niece with thyroid disease.   HISTORY:  Past Medical History:  Past Medical History:  Diagnosis Date  . Abdominal bruit    Abdominal bruit in the past no aortic aneurysm by evaluation in the past  . Aortic regurgitation    Moderate by echo 05/2016  . Arm weakness    Arm weakness with exercise, January, 2012  . Benign essential HTN 01/27/2017  . Bradycardia   . Constipation    Constipation with diltiazem March, 2012  . Diastolic dysfunction    Mild  . Dizziness   . Ejection fraction 2011   55-60% ejection fraction, echo, January, 2012  . HTN (hypertension)   . Hypothyroidism   . Leg cramps    from lipitor in the past  . Mitral regurgitation    echo..04/2007  /  mild..04/2010  . PAC (premature atrial contraction)   . Palpitation   . Pericardial effusion    small...posterior...echo..04/2007  . Polymyalgia ([facility])   . Pulmonary hypertension ([facility])   . Rheumatic fever    Questionable history of rheumatic fever, but no history of valvular abnormalities  . Sinus tachycardia   . Temporal arteritis  (HCC)    Rule out temporal arteritis in the past  . Tricuspid regurgitation    -moderate...echo..04/2007...Marland KitchenMarland KitchenEF  60%..echo..04/2007  /   55-60%....echo..05/21/2010   Past Surgical History:  Past Surgical History:  Procedure Laterality Date  . BACK SURGERY    . BUNIONECTOMY    . HIP PINNING,CANNULATED Left 04/12/2016   Procedure: CANNULATED HIP PINNING;  Surgeon: Marchia Bond, MD;  Location: [facility];  Service: Orthopedics;  Laterality: Left;  . NECK SURGERY    . TOTAL ABDOMINAL HYSTERECTOMY        Social History:  reports that she has never smoked. She has never used smokeless tobacco. She reports that she does not drink alcohol or use drugs.  Family History: family history includes Heart disease in her mother; Stroke in her mother.   HOME MEDICATIONS: Current Outpatient Medications on File Prior to Visit  Medication Sig Dispense Refill  . Acetaminophen (TYLENOL PO) Take 650 mg by mouth as needed.    Marland Kitchen apixaban (ELIQUIS) 2.5 MG TABS tablet Take 1 tablet (2.5 mg total) by mouth 2 (two) times daily. 180 tablet 3  . atorvastatin (LIPITOR) 20 MG tablet Take 20 mg by mouth daily.     Marland Kitchen BIOTIN PO Take 500 mg by mouth daily.    . Cholecalciferol (VITAMIN D3) 1000 UNITS CAPS Take 1 capsule by  mouth daily. 1 tab po qd    . diltiazem (CARDIZEM CD) 180 MG 24 hr capsule TAKE 1 CAPSULE DAILY 90 capsule 1  . diltiazem (CARDIZEM) 30 MG tablet Take 1 tablet (30 mg total) by mouth daily as needed. For breakthrough atrial fibrillation. 90 tablet 3  . fish oil-omega-3 fatty acids 1000 MG capsule Take 1 g by mouth daily.      . hydrochlorothiazide 25 MG tablet Take 25 mg by mouth daily.     Marland Kitchen levothyroxine (SYNTHROID, LEVOTHROID) 112 MCG tablet Take 50 mcg by mouth daily before breakfast.     . omeprazole (PRILOSEC OTC) 20 MG tablet Take 20 mg by mouth 2 (two) times daily.    . polyethylene glycol (MIRALAX / GLYCOLAX) packet Take 17 g by mouth daily.    . potassium chloride SA (K-DUR,KLOR-CON) 20 MEQ  tablet Take 1 tablet (20 mEq total) by mouth daily. 90 tablet 1  . predniSONE (DELTASONE) 1 MG tablet Take 3 mg by mouth daily with breakfast.     . sennosides-docusate sodium (SENOKOT-S) 8.6-50 MG tablet Take 2 tablets by mouth 2 (two) times daily.    . traMADol (ULTRAM) 50 MG tablet Take 1 tablet (50 mg total) by mouth every 6 (six) hours as needed. 30 tablet 0  . Turmeric 500 MG CAPS Take 500 mg by mouth daily.     No current facility-administered medications on file prior to visit.       REVIEW OF SYSTEMS: A comprehensive ROS was conducted with the patient and is negative except as per HPI and below:  Review of Systems  Constitutional: Positive for weight loss. Negative for malaise/fatigue.  Eyes: Negative for blurred vision and pain.  Respiratory: Negative for cough and shortness of breath.   Cardiovascular: Positive for palpitations. Negative for chest pain.  Gastrointestinal: Positive for constipation. Negative for nausea.  Genitourinary: Negative for dysuria and frequency.  Musculoskeletal: Negative for falls and neck pain.  Neurological: Negative for tingling and tremors.  Psychiatric/Behavioral: Negative for depression. The patient is not nervous/anxious.        OBJECTIVE:  VS: BP (!) 142/62 (BP Location: Left Arm, Patient Position: Sitting, Cuff Size: Normal)   Pulse (!) 56   Ht 5\' 6"  (1.676 m)   Wt 132 lb 9.6 oz (60.1 kg)   SpO2 98%   BMI 21.40 kg/m    Wt Readings from Last 3 Encounters:  02/22/18 132 lb 9.6 oz (60.1 kg)  02/16/18 133 lb 12.8 oz (60.7 kg)  07/29/17 129 lb 12.8 oz (58.9 kg)     EXAM: General: Pt appears well and is in NAD  Hydration: Well-hydrated with moist mucous membranes and good skin turgor  Eyes: External eye exam normal without stare, lid lag or exophthalmos.  EOM intact.  PERRL.  Ears, Nose, Throat: Hearing: Grossly intact bilaterally Dental: Good dentition  Throat: Clear without mass, erythema or exudate  Neck: General: Supple  without adenopathy. Thyroid: No goiter or nodules appreciated.  Lungs: Clear with good BS bilat with no rales, rhonchi, or wheezes  Heart: Auscultation: RRR.  Abdomen: Normoactive bowel sounds, soft, nontender, without masses or organomegaly palpable  Extremities: BL LE: No pretibial edema normal ROM and strength.  Skin: Hair: Texture and amount normal with gender appropriate distribution Skin Inspection: No rashes. Skin Palpation: Skin temperature, texture, and thickness normal to palpation  Neuro: Cranial nerves: II - XII grossly intact  Cerebellar: Normal coordination and movement; no tremor Motor: Normal strength throughout DTRs: 2+ and  symmetric in UE without delay in relaxation phase  Mental Status: Judgment, insight: Intact Orientation: Oriented to time, place, and person Mood and affect: No depression, anxiety, or agitation     DATA REVIEWED:                                                                                                     12/07/17                                  09/15/17                                                      07/01/17  TSH 0.17 uIU/mL                   0.16 uIU/mL                                            0.03 uIU/mL    Old records , labs and images have been reviewed.   ASSESSMENT/PLAN/RECOMMENDATIONS:   1. Post- Operative Hypothyroidism :   -  Patient with weight gain and bradycardia that could be attributed to hypothyroidism. - She denies any local neck symptoms - We discussed the effect of Biotin on TFT 's and how it will make TSH seem lower then normal. Patient advised to hold Biotin 72 hrs prior to lab testing in the future - We also discussed, if indeed her TSH continue to be low, another explanation that her thyroid gland has developed autonomous function which is not unusual in someone with prior Hx of MNG - - Pt educated extensively on the correct way to take levothyroxine (first thing in the morning with water, 30 minutes before  eating or taking other medications). - Pt encouraged to double dose the following day if she were to miss a dose given long half-life of levothyroxine.   Recommendations:  - Continue Levothyroxine 50 mcg daily for now - Recheck TFT's next week , while off Biotin    Patient expressed understanding of the above      F/u 2 months    Signed electronically by: Mack Guise, MD  [facility]  [address] [place], [place] [address] Phone: [phone] FAX: [phone]   CC: Leighton Ruff, [place] [address] Phone: [phone] Fax: [phone]   Return to Endocrinology clinic as below: Future Appointments  Date Time Provider [place]  10/18/2018  8:45 AM Hayden Pedro, MD [facility] None

## 2018-02-22 ENCOUNTER — Ambulatory Visit: Payer: Medicare HMO | Admitting: Internal Medicine

## 2018-02-22 ENCOUNTER — Encounter: Payer: Self-pay | Admitting: Internal Medicine

## 2018-02-22 ENCOUNTER — Telehealth: Payer: Self-pay

## 2018-02-22 VITALS — BP 142/62 | HR 56 | Ht 66.0 in | Wt 132.6 lb

## 2018-02-22 DIAGNOSIS — I10 Essential (primary) hypertension: Secondary | ICD-10-CM

## 2018-02-22 DIAGNOSIS — E890 Postprocedural hypothyroidism: Secondary | ICD-10-CM

## 2018-02-22 DIAGNOSIS — I4819 Other persistent atrial fibrillation: Secondary | ICD-10-CM

## 2018-02-22 NOTE — Telephone Encounter (Signed)
The patient only took a few of the kdur 20 meq back when she was prescribed on 10/19, she did not know it was a long-term medication.The patient said she would take the 20 meq once a day. Sending to Dr. Radford Pax for recommendations.

## 2018-02-22 NOTE — Patient Instructions (Signed)
-   Please HOLD the Biotin for 3 days prior to thyroid blood check - Please stop by the office next week to have your thyroid rechecked.  - Continue to take Levothyroxine at 50 mcg daily for now - You are on levothyroxine - which is your thyroid hormone supplement. You MUST take this consistently.  You should take this first thing in the morning on an empty stomach with water. You should not take it with other medications. Wait 48min to 1hr prior to eating. If you are taking any vitamins - please take these in the evening.   If you miss a dose, please take your missed dose the following day (double the dose for that day). You should have a pill box for ONLY levothyroxine on your bedside table to help you remember to take your medications.

## 2018-02-22 NOTE — Telephone Encounter (Signed)
Patient returning call.

## 2018-02-22 NOTE — Telephone Encounter (Signed)
-----   Message from Sueanne Margarita, MD sent at 02/16/2018 10:06 PM EDT ----- K+ borderline low - increase Kdur to 12meq BID and repeat BMET in 1 week

## 2018-02-22 NOTE — Telephone Encounter (Signed)
Left message to call back  

## 2018-02-22 NOTE — Telephone Encounter (Signed)
Repeat BMET in 1 week 

## 2018-02-23 ENCOUNTER — Encounter: Payer: Self-pay | Admitting: Cardiology

## 2018-02-23 MED ORDER — POTASSIUM CHLORIDE CRYS ER 20 MEQ PO TBCR: 20.0000 meq | EXTENDED_RELEASE_TABLET | Freq: Every day | ORAL | 3 refills | Status: DC

## 2018-02-23 NOTE — Telephone Encounter (Signed)
Spoke with the patient, she accepted taking potassium and would cut the pill in half to make swallowing easier. She accepted a BMET on 11/12.

## 2018-02-23 NOTE — Telephone Encounter (Signed)
Duplicate Encounter

## 2018-02-23 NOTE — Telephone Encounter (Signed)
New message   Pt c/o medication issue:  1. Name of Medication:   potassium chloride SA (K-DUR,KLOR-CON) 20 MEQ tablet     2. How are you currently taking this medication (dosage and times per day)? 1 time a day  3. Are you having a reaction (difficulty breathing--STAT)? No   4. What is your medication issue? Patient states that the pill is too big to swallow. She wants to see if it can be get it in 10 mg.

## 2018-03-02 ENCOUNTER — Other Ambulatory Visit: Payer: Medicare HMO

## 2018-03-02 ENCOUNTER — Telehealth: Payer: Self-pay

## 2018-03-02 DIAGNOSIS — I4819 Other persistent atrial fibrillation: Secondary | ICD-10-CM | POA: Diagnosis not present

## 2018-03-02 DIAGNOSIS — I10 Essential (primary) hypertension: Secondary | ICD-10-CM

## 2018-03-02 LAB — BASIC METABOLIC PANEL
BUN/Creatinine Ratio: 11 — ABNORMAL LOW (ref 12–28)
BUN: 12 mg/dL (ref 8–27)
CALCIUM: 9.4 mg/dL (ref 8.7–10.3)
CO2: 25 mmol/L (ref 20–29)
Chloride: 94 mmol/L — ABNORMAL LOW (ref 96–106)
Creatinine, Ser: 1.12 mg/dL — ABNORMAL HIGH (ref 0.57–1.00)
GFR calc non Af Amer: 46 mL/min/{1.73_m2} — ABNORMAL LOW (ref >59)
GFR, EST AFRICAN AMERICAN: 53 mL/min/{1.73_m2} — AB (ref >59)
Glucose: 74 mg/dL (ref 65–99)
POTASSIUM: 3.9 mmol/L (ref 3.5–5.2)
Sodium: 133 mmol/L — ABNORMAL LOW (ref 134–144)

## 2018-03-02 NOTE — Telephone Encounter (Signed)
**Note De-Identified Jordyne Poehlman Obfuscation** The pt dropped off the provider part of her BMS pt asst application with a note asking [place] to complete the application and to call her when it is ready to be picked up.  I have completed the application and placed it in Dr Landis Gandy mail bin awaiting her signature.

## 2018-03-03 ENCOUNTER — Telehealth: Payer: Self-pay | Admitting: Internal Medicine

## 2018-03-03 ENCOUNTER — Other Ambulatory Visit (INDEPENDENT_AMBULATORY_CARE_PROVIDER_SITE_OTHER): Payer: Medicare HMO

## 2018-03-03 DIAGNOSIS — E890 Postprocedural hypothyroidism: Secondary | ICD-10-CM

## 2018-03-03 LAB — TSH: TSH: 15.61 u[IU]/mL — AB (ref 0.35–4.50)

## 2018-03-03 LAB — T4, FREE: FREE T4: 0.81 ng/dL (ref 0.60–1.60)

## 2018-03-03 MED ORDER — LEVOTHYROXINE SODIUM 100 MCG PO TABS: 100.0000 ug | ORAL_TABLET | Freq: Every day | ORAL | 3 refills | Status: DC

## 2018-03-03 NOTE — Telephone Encounter (Signed)
Spoke to Samantha Henderson about her TFT results.    She has stopped the Biotin a week ago She has continued on Levothyroxine 50 mcg daily   Results for Samantha Henderson, Samantha Henderson (MRN [id number]) as of 03/03/2018 13:42  Ref. Range 03/03/2018 09:40  TSH Latest Ref Range: 0.35 - 4.50 uIU/mL 15.61 (H)  T4,Free(Direct) Latest Ref Range: 0.60 - 1.60 ng/dL 0.81     Recommendations - Increase Levothyroxine to 100 mcg daily  - I explained to her that Biotin will NOT interfere with her Levothyroxine but it will interfere with thyroid function tests, she has to stop it 72 hrs prior to testing.  - Recheck on next visit    Pt expressed understanding    [name]

## 2018-03-09 NOTE — Telephone Encounter (Signed)
Dr Radford Pax has signed the pts BMS pt asst application for Eliquis. I called the pt to let her know and she states that she will come by the office to pick up.

## 2018-03-23 DIAGNOSIS — H40003 Preglaucoma, unspecified, bilateral: Secondary | ICD-10-CM | POA: Diagnosis not present

## 2018-03-23 DIAGNOSIS — Z961 Presence of intraocular lens: Secondary | ICD-10-CM | POA: Diagnosis not present

## 2018-03-23 DIAGNOSIS — H02831 Dermatochalasis of right upper eyelid: Secondary | ICD-10-CM | POA: Diagnosis not present

## 2018-03-23 DIAGNOSIS — H18413 Arcus senilis, bilateral: Secondary | ICD-10-CM | POA: Diagnosis not present

## 2018-03-29 ENCOUNTER — Telehealth: Payer: Self-pay

## 2018-03-29 NOTE — Telephone Encounter (Signed)
We received another Adverse Event Report Questionnaire concerning the pts Eliquis from [facility]. I have completed the form and faxed it back to BMS along with notes showing that the reason for the dose change in the pts Eliquis from 5 mg to 2.5 mg is due to her age and weight.

## 2018-03-31 DIAGNOSIS — Z6820 Body mass index (BMI) 20.0-20.9, adult: Secondary | ICD-10-CM | POA: Diagnosis not present

## 2018-03-31 DIAGNOSIS — M1991 Primary osteoarthritis, unspecified site: Secondary | ICD-10-CM | POA: Diagnosis not present

## 2018-03-31 DIAGNOSIS — M545 Low back pain: Secondary | ICD-10-CM | POA: Diagnosis not present

## 2018-03-31 DIAGNOSIS — M2550 Pain in unspecified joint: Secondary | ICD-10-CM | POA: Diagnosis not present

## 2018-03-31 DIAGNOSIS — M0579 Rheumatoid arthritis with rheumatoid factor of multiple sites without organ or systems involvement: Secondary | ICD-10-CM | POA: Diagnosis not present

## 2018-03-31 DIAGNOSIS — M154 Erosive (osteo)arthritis: Secondary | ICD-10-CM | POA: Diagnosis not present

## 2018-03-31 DIAGNOSIS — Z79899 Other long term (current) drug therapy: Secondary | ICD-10-CM | POA: Diagnosis not present

## 2018-04-06 DIAGNOSIS — R69 Illness, unspecified: Secondary | ICD-10-CM | POA: Diagnosis not present

## 2018-04-19 ENCOUNTER — Ambulatory Visit (INDEPENDENT_AMBULATORY_CARE_PROVIDER_SITE_OTHER): Payer: Medicare HMO | Admitting: Internal Medicine

## 2018-04-19 ENCOUNTER — Encounter: Payer: Self-pay | Admitting: Internal Medicine

## 2018-04-19 VITALS — BP 140/58 | HR 67 | Ht 66.0 in | Wt 132.0 lb

## 2018-04-19 DIAGNOSIS — E890 Postprocedural hypothyroidism: Secondary | ICD-10-CM

## 2018-04-19 LAB — TSH: TSH: 0.84 u[IU]/mL (ref 0.35–4.50)

## 2018-04-19 LAB — T4, FREE: Free T4: 1.35 ng/dL (ref 0.60–1.60)

## 2018-04-19 NOTE — Progress Notes (Signed)
Name: Samantha Henderson  MRN/ DOB: [id number], 09/23/1934    Age/ Sex: 83 y.o., female     PCP: Leighton Ruff, MD   Reason for Endocrinology Evaluation: Postoperative Hypothyroidism      Initial Endocrinology Clinic Visit: 02/22/2018    PATIENT IDENTIFIER: Samantha Henderson is a 83 y.o., female with a past medical history of HTN, temporal arteritis and A.Fib. She has followed with [facility] since 02/22/2018 for consultative assistance with management of her Hypothyroidism .   HISTORICAL SUMMARY: . She is S/P surgical hypothyroidism in 1961 due to [place]. She was always on Levothyroxine 112 mcg up until 2019, when her doses have been gradually reduced due to low TS. On her initial visit to our office she was on Levothyroxine 50 mcg daily with symptomatic hypothyroidism. She was on Biotin at the time. We have asked her to hold it for a few days with repeat TFT's consistent with hypothyroidism TSH 15.61 uIU/mL . We started her on Levothyroxine 100 mcg daily and she was asked to hold Biotin for at least 3 days prior to her lab work in the future.    SUBJECTIVE:    Today (04/19/2018):  Samantha Henderson is here for her 2 month follow up on hypothyroidism. She has been taking LT-4 replacement consistently and as prescribed. She has been feeling much better on current dose of LT-4 replacement.   Her fatigue has resolved. She has chronic constipation , takes miralax regularly.  Denies depression or anxeity Denies sob or cough, no chest pain Denies local neck symptoms   ROS:  As per HPI.   HISTORY:  Past Medical History:  Past Medical History:  Diagnosis Date  . Abdominal bruit    Abdominal bruit in the past no aortic aneurysm by evaluation in the past  . Aortic regurgitation    Moderate by echo 05/2016  . Arm weakness    Arm weakness with exercise, January, 2012  . Benign essential HTN 01/27/2017  . Bradycardia   . Constipation    Constipation with diltiazem March, 2012  .  Diastolic dysfunction    Mild  . Dizziness   . Ejection fraction 2011   55-60% ejection fraction, echo, January, 2012  . HTN (hypertension)   . Hypothyroidism   . Leg cramps    from lipitor in the past  . Mitral regurgitation    echo..04/2007  /  mild..04/2010  . PAC (premature atrial contraction)   . Palpitation   . Pericardial effusion    small...posterior...echo..04/2007  . Polymyalgia ([place])   . Pulmonary hypertension ([place])   . Rheumatic fever    Questionable history of rheumatic fever, but no history of valvular abnormalities  . Sinus tachycardia   . Temporal arteritis (HCC)    Rule out temporal arteritis in the past  . Tricuspid regurgitation    -moderate...echo..04/2007...Marland KitchenMarland KitchenEF  60%..echo..04/2007  /   55-60%....echo..05/21/2010    Past Surgical History:  Past Surgical History:  Procedure Laterality Date  . BACK SURGERY    . BUNIONECTOMY    . HIP PINNING,CANNULATED Left 04/12/2016   Procedure: CANNULATED HIP PINNING;  Surgeon: Marchia Bond, MD;  Location: [facility];  Service: Orthopedics;  Laterality: Left;  . NECK SURGERY    . TOTAL ABDOMINAL HYSTERECTOMY       Social History:  reports that she has never smoked. She has never used smokeless tobacco. She reports that she does not drink alcohol or use drugs.  Family History: family history includes Heart  disease in her mother; Stroke in her mother.   HOME MEDICATIONS: Levothyroxine 100 mcg daily     OBJECTIVE:   PHYSICAL EXAM: VS: BP (!) 140/58 (BP Location: Left Arm, Patient Position: Sitting, Cuff Size: Normal)   Pulse 67   Ht 5\' 6"  (1.676 m)   SpO2 98%   BMI 21.40 kg/m    EXAM: General: Pt appears well and is in NAD  Neck: General: Supple without adenopathy. Thyroid: Thyroid size normal.  No goiter or nodules appreciated. No thyroid bruit.  Lungs: Clear with good BS bilat with no rales, rhonchi, or wheezes  Heart: Auscultation: RRR.  Abdomen: Normoactive bowel sounds, soft, nontender, without masses or  organomegaly palpable  Extremities:  BL LE: No pretibial edema normal ROM and strength.  Skin: Hair: Texture and amount normal with gender appropriate distribution Skin Inspection: No rashes. Skin temperature, texture, and thickness normal to palpation  Neuro: Cranial nerves: II - XII grossly intact  Motor: Normal strength throughout DTRs: 2+ and symmetric in UE without delay in relaxation phase  Mental Status: Judgment, insight: Intact Orientation: Oriented to time, place, and person Mood and affect: No depression, anxiety, or agitation     DATA REVIEWED:  Results for LEYA, PAIGE (MRN [id number]) as of 04/19/2018 12:31  Ref. Range 04/19/2018 09:33  TSH Latest Ref Range: 0.35 - 4.50 uIU/mL 0.84  T4,Free(Direct) Latest Ref Range: 0.60 - 1.60 ng/dL 1.35     ASSESSMENT / PLAN / RECOMMENDATIONS:   1. Post-Operative Hypothyroidism :  - She is clinically and biochemically euthyroid  - She denies any local neck symptoms  - Pt educated extensively on the correct way to take levothyroxine (first thing in the morning with water, 30 minutes before eating or taking other medications). - Pt encouraged to double dose the following day if she were to miss a dose given long half-life of levothyroxine.   Medications  Levothyroxine 100 mcg daily     F/U in 6 months    Signed electronically by: Mack Guise, MD  [facility] End[facility]  [address] [place], [place] [address] Phone: [phone] FAX: [phone]      CC: Leighton Ruff, [place] [address] Phone: [phone]  Fax: [phone]   Return to Endocrinology clinic as below: Future Appointments  Date Time Provider [place]  04/19/2018  9:30 AM Junior Kenedy, Melanie Crazier, MD [facility] None  10/18/2018  8:45 AM Hayden Pedro, MD [facility] None

## 2018-04-19 NOTE — Patient Instructions (Signed)

## 2018-04-27 ENCOUNTER — Telehealth: Payer: Self-pay | Admitting: Internal Medicine

## 2018-04-27 NOTE — Telephone Encounter (Signed)
Patient is requesting results for labs. Please Advise, thanks

## 2018-04-28 NOTE — Telephone Encounter (Signed)
Patient is calling for lab results, please advice. Thank you.

## 2018-04-28 NOTE — Telephone Encounter (Signed)
LVM To return call 

## 2018-04-28 NOTE — Telephone Encounter (Signed)
Patient made aware per MD thyroid function tests are normal. Please continue Levothyroxine at 100 mcg daily. Patient expressed understanding.

## 2018-05-06 ENCOUNTER — Other Ambulatory Visit: Payer: Self-pay

## 2018-05-06 MED ORDER — APIXABAN 2.5 MG PO TABS: 2.5000 mg | ORAL_TABLET | Freq: Two times a day (BID) | ORAL | 1 refills | Status: DC

## 2018-05-06 NOTE — Telephone Encounter (Signed)
Called pt to let them know refill will be sent pt is an 3 yof cr of 1.12(03/02/18) wt of59kg last seen in office 02/16/18 refill sent to [facility] mail order

## 2018-05-17 ENCOUNTER — Telehealth: Payer: Self-pay | Admitting: Internal Medicine

## 2018-05-17 ENCOUNTER — Other Ambulatory Visit: Payer: Self-pay | Admitting: Pharmacist

## 2018-05-17 ENCOUNTER — Other Ambulatory Visit: Payer: Self-pay

## 2018-05-17 MED ORDER — APIXABAN 2.5 MG PO TABS: 2.5000 mg | ORAL_TABLET | Freq: Two times a day (BID) | ORAL | 1 refills | Status: DC

## 2018-05-17 MED ORDER — LEVOTHYROXINE SODIUM 100 MCG PO TABS: 100.0000 ug | ORAL_TABLET | Freq: Every day | ORAL | 1 refills | Status: DC

## 2018-05-17 NOTE — Telephone Encounter (Signed)
Patient's Medication was sent to the wrong pharmacy and needs it sent to the [facility] Rx home Delivery.    levothyroxine (SYNTHROID, LEVOTHROID) 100 MCG tablet   [place]

## 2018-05-17 NOTE — Telephone Encounter (Signed)
Rx sent to pharmacy listed below. 

## 2018-05-19 ENCOUNTER — Other Ambulatory Visit: Payer: Self-pay

## 2018-05-19 ENCOUNTER — Telehealth: Payer: Self-pay | Admitting: Internal Medicine

## 2018-05-19 MED ORDER — LEVOTHYROXINE SODIUM 100 MCG PO TABS: 100.0000 ug | ORAL_TABLET | Freq: Every day | ORAL | 1 refills | Status: DC

## 2018-05-19 NOTE — Telephone Encounter (Signed)
Patient called [facility] @ 4:16pm 05/18/2018    "caller states she wants a prescription for Synthroid sent into the pharmacy. Because the generic brand was called in instead and she cancelled that one and she wants a 3 month supply. States she is wondering is the doctor specifically wanted her to take Generic or if it mistake? "   Please advise

## 2018-05-19 NOTE — Telephone Encounter (Signed)
This has already been addressed with the patient. She was advised that there was no mention in the doctors last note stating that she needed the name brand Synthroid and a new prescription was sent to mail order pharmacy and patient is aware.

## 2018-05-25 ENCOUNTER — Telehealth: Payer: Self-pay

## 2018-05-25 NOTE — Telephone Encounter (Signed)
**Note De-Identified [name] Obfuscation** We received a letter Samantha Henderson fax from [employer] stating that they have denied the pt pt asst for Eliquis. Reason: Product covered by insurance.  The letter states that they have notified the pt of this denial.

## 2018-06-28 DIAGNOSIS — M5030 Other cervical disc degeneration, unspecified cervical region: Secondary | ICD-10-CM | POA: Diagnosis not present

## 2018-06-28 DIAGNOSIS — M5136 Other intervertebral disc degeneration, lumbar region: Secondary | ICD-10-CM | POA: Diagnosis not present

## 2018-06-28 DIAGNOSIS — M961 Postlaminectomy syndrome, not elsewhere classified: Secondary | ICD-10-CM | POA: Diagnosis not present

## 2018-06-28 DIAGNOSIS — M542 Cervicalgia: Secondary | ICD-10-CM | POA: Diagnosis not present

## 2018-06-28 DIAGNOSIS — M545 Low back pain: Secondary | ICD-10-CM | POA: Diagnosis not present

## 2018-07-06 DIAGNOSIS — I480 Paroxysmal atrial fibrillation: Secondary | ICD-10-CM | POA: Diagnosis not present

## 2018-07-06 DIAGNOSIS — Z79899 Other long term (current) drug therapy: Secondary | ICD-10-CM | POA: Diagnosis not present

## 2018-07-06 DIAGNOSIS — M069 Rheumatoid arthritis, unspecified: Secondary | ICD-10-CM | POA: Diagnosis not present

## 2018-07-06 DIAGNOSIS — G479 Sleep disorder, unspecified: Secondary | ICD-10-CM | POA: Diagnosis not present

## 2018-07-06 DIAGNOSIS — Z0000 Encounter for general adult medical examination without abnormal findings: Secondary | ICD-10-CM | POA: Diagnosis not present

## 2018-07-06 DIAGNOSIS — Z1389 Encounter for screening for other disorder: Secondary | ICD-10-CM | POA: Diagnosis not present

## 2018-07-06 DIAGNOSIS — E559 Vitamin D deficiency, unspecified: Secondary | ICD-10-CM | POA: Diagnosis not present

## 2018-07-06 DIAGNOSIS — R69 Illness, unspecified: Secondary | ICD-10-CM | POA: Diagnosis not present

## 2018-07-06 DIAGNOSIS — I10 Essential (primary) hypertension: Secondary | ICD-10-CM | POA: Diagnosis not present

## 2018-07-06 DIAGNOSIS — E039 Hypothyroidism, unspecified: Secondary | ICD-10-CM | POA: Diagnosis not present

## 2018-07-06 DIAGNOSIS — N183 Chronic kidney disease, stage 3 (moderate): Secondary | ICD-10-CM | POA: Diagnosis not present

## 2018-07-08 DIAGNOSIS — M545 Low back pain: Secondary | ICD-10-CM | POA: Diagnosis not present

## 2018-07-08 DIAGNOSIS — M542 Cervicalgia: Secondary | ICD-10-CM | POA: Diagnosis not present

## 2018-07-19 ENCOUNTER — Other Ambulatory Visit: Payer: Self-pay | Admitting: Cardiology

## 2018-07-26 DIAGNOSIS — E878 Other disorders of electrolyte and fluid balance, not elsewhere classified: Secondary | ICD-10-CM | POA: Diagnosis not present

## 2018-09-20 NOTE — Telephone Encounter (Signed)
**Note De-Identified Samantha Henderson Obfuscation** Letter received [place] fax from [employer] stating that they have approved the pt for pt asst with her Eliquis.  Approval good from 09/16/2018 until 04/21/2019.  The letter states that they have notified the pt of this approval.

## 2018-09-21 ENCOUNTER — Telehealth: Payer: Self-pay | Admitting: Cardiology

## 2018-09-21 MED ORDER — APIXABAN 2.5 MG PO TABS: 2.5000 mg | ORAL_TABLET | Freq: Two times a day (BID) | ORAL | 1 refills | Status: DC

## 2018-09-21 NOTE — Telephone Encounter (Signed)
New Message    *STAT* If patient is at the pharmacy, call can be transferred to refill team.   1. Which medications need to be refilled? (please list name of each medication and dose if known) apixaban (ELIQUIS) 2.5 MG TABS tablet  2. Which pharmacy/location (including street and city if local pharmacy) is medication to be sent to? [facility] fax number [phone]  3. Do they need a 30 day or 90 day supply? [place]

## 2018-09-21 NOTE — Telephone Encounter (Signed)
Age 84 Weight 59.9kg Scr 1.23 on 07/29/2018

## 2018-09-28 DIAGNOSIS — M545 Low back pain: Secondary | ICD-10-CM | POA: Diagnosis not present

## 2018-09-28 DIAGNOSIS — M542 Cervicalgia: Secondary | ICD-10-CM | POA: Diagnosis not present

## 2018-09-29 DIAGNOSIS — M2550 Pain in unspecified joint: Secondary | ICD-10-CM | POA: Diagnosis not present

## 2018-09-29 DIAGNOSIS — M154 Erosive (osteo)arthritis: Secondary | ICD-10-CM | POA: Diagnosis not present

## 2018-09-29 DIAGNOSIS — M1991 Primary osteoarthritis, unspecified site: Secondary | ICD-10-CM | POA: Diagnosis not present

## 2018-09-29 DIAGNOSIS — M545 Low back pain: Secondary | ICD-10-CM | POA: Diagnosis not present

## 2018-09-29 DIAGNOSIS — Z79899 Other long term (current) drug therapy: Secondary | ICD-10-CM | POA: Diagnosis not present

## 2018-09-29 DIAGNOSIS — M0579 Rheumatoid arthritis with rheumatoid factor of multiple sites without organ or systems involvement: Secondary | ICD-10-CM | POA: Diagnosis not present

## 2018-10-05 ENCOUNTER — Telehealth (HOSPITAL_COMMUNITY): Payer: Self-pay | Admitting: Cardiology

## 2018-10-05 DIAGNOSIS — H16223 Keratoconjunctivitis sicca, not specified as Sjogren's, bilateral: Secondary | ICD-10-CM | POA: Diagnosis not present

## 2018-10-05 DIAGNOSIS — Z961 Presence of intraocular lens: Secondary | ICD-10-CM | POA: Diagnosis not present

## 2018-10-05 DIAGNOSIS — H40003 Preglaucoma, unspecified, bilateral: Secondary | ICD-10-CM | POA: Diagnosis not present

## 2018-10-05 DIAGNOSIS — M542 Cervicalgia: Secondary | ICD-10-CM | POA: Diagnosis not present

## 2018-10-05 DIAGNOSIS — H18413 Arcus senilis, bilateral: Secondary | ICD-10-CM | POA: Diagnosis not present

## 2018-10-05 DIAGNOSIS — M545 Low back pain: Secondary | ICD-10-CM | POA: Diagnosis not present

## 2018-10-05 NOTE — Telephone Encounter (Signed)
Left message on home answering machine

## 2018-10-06 ENCOUNTER — Other Ambulatory Visit: Payer: Self-pay

## 2018-10-06 ENCOUNTER — Ambulatory Visit (HOSPITAL_COMMUNITY): Payer: Medicare HMO | Attending: Cardiology

## 2018-10-06 DIAGNOSIS — I351 Nonrheumatic aortic (valve) insufficiency: Secondary | ICD-10-CM | POA: Diagnosis not present

## 2018-10-11 ENCOUNTER — Other Ambulatory Visit: Payer: Self-pay | Admitting: Internal Medicine

## 2018-10-12 ENCOUNTER — Telehealth: Payer: Self-pay | Admitting: Cardiology

## 2018-10-12 NOTE — Telephone Encounter (Signed)
Spoke with pt and was suppose to have 6 mo visit with APP which would have been in April and year with Dr Radford Pax due in Oct  Appt made with Dr Radford Pax July 27 pt wants a office visit not virtual visit ./cy  Last echo stable ./cy

## 2018-10-12 NOTE — Telephone Encounter (Signed)
New Message     Pt is calling and is wondering if she needs to make an appt with Dr Radford Pax, she says she thinks she is suppose to see Dr Radford Pax after her Echo     Please call

## 2018-10-14 DIAGNOSIS — N644 Mastodynia: Secondary | ICD-10-CM | POA: Diagnosis not present

## 2018-10-14 DIAGNOSIS — Z8781 Personal history of (healed) traumatic fracture: Secondary | ICD-10-CM | POA: Diagnosis not present

## 2018-10-14 DIAGNOSIS — D049 Carcinoma in situ of skin, unspecified: Secondary | ICD-10-CM | POA: Diagnosis not present

## 2018-10-14 DIAGNOSIS — R635 Abnormal weight gain: Secondary | ICD-10-CM | POA: Diagnosis not present

## 2018-10-14 DIAGNOSIS — L821 Other seborrheic keratosis: Secondary | ICD-10-CM | POA: Diagnosis not present

## 2018-10-14 DIAGNOSIS — Z8269 Family history of other diseases of the musculoskeletal system and connective tissue: Secondary | ICD-10-CM | POA: Diagnosis not present

## 2018-10-14 DIAGNOSIS — D0439 Carcinoma in situ of skin of other parts of face: Secondary | ICD-10-CM | POA: Diagnosis not present

## 2018-10-14 DIAGNOSIS — R29890 Loss of height: Secondary | ICD-10-CM | POA: Diagnosis not present

## 2018-10-14 DIAGNOSIS — D485 Neoplasm of uncertain behavior of skin: Secondary | ICD-10-CM | POA: Diagnosis not present

## 2018-10-14 DIAGNOSIS — L570 Actinic keratosis: Secondary | ICD-10-CM | POA: Diagnosis not present

## 2018-10-14 DIAGNOSIS — D2239 Melanocytic nevi of other parts of face: Secondary | ICD-10-CM | POA: Diagnosis not present

## 2018-10-14 DIAGNOSIS — Z90710 Acquired absence of both cervix and uterus: Secondary | ICD-10-CM | POA: Diagnosis not present

## 2018-10-14 DIAGNOSIS — M8589 Other specified disorders of bone density and structure, multiple sites: Secondary | ICD-10-CM | POA: Diagnosis not present

## 2018-10-18 ENCOUNTER — Encounter (INDEPENDENT_AMBULATORY_CARE_PROVIDER_SITE_OTHER): Payer: Medicare HMO | Admitting: Ophthalmology

## 2018-10-18 ENCOUNTER — Other Ambulatory Visit: Payer: Self-pay

## 2018-10-18 DIAGNOSIS — H43813 Vitreous degeneration, bilateral: Secondary | ICD-10-CM

## 2018-10-18 DIAGNOSIS — H33303 Unspecified retinal break, bilateral: Secondary | ICD-10-CM

## 2018-10-18 DIAGNOSIS — I10 Essential (primary) hypertension: Secondary | ICD-10-CM

## 2018-10-18 DIAGNOSIS — H35033 Hypertensive retinopathy, bilateral: Secondary | ICD-10-CM | POA: Diagnosis not present

## 2018-10-19 ENCOUNTER — Encounter: Payer: Self-pay | Admitting: Internal Medicine

## 2018-10-19 ENCOUNTER — Ambulatory Visit (INDEPENDENT_AMBULATORY_CARE_PROVIDER_SITE_OTHER): Payer: Medicare HMO | Admitting: Internal Medicine

## 2018-10-19 VITALS — BP 118/64 | HR 85 | Temp 97.8°F | Ht 66.0 in | Wt 133.4 lb

## 2018-10-19 DIAGNOSIS — M5030 Other cervical disc degeneration, unspecified cervical region: Secondary | ICD-10-CM | POA: Diagnosis not present

## 2018-10-19 DIAGNOSIS — E890 Postprocedural hypothyroidism: Secondary | ICD-10-CM

## 2018-10-19 DIAGNOSIS — M545 Low back pain: Secondary | ICD-10-CM | POA: Diagnosis not present

## 2018-10-19 LAB — TSH: TSH: 0.57 u[IU]/mL (ref 0.35–4.50)

## 2018-10-19 LAB — T4, FREE: Free T4: 1.32 ng/dL (ref 0.60–1.60)

## 2018-10-19 MED ORDER — LEVOTHYROXINE SODIUM 88 MCG PO TABS: 88.0000 ug | ORAL_TABLET | Freq: Every day | ORAL | 3 refills | Status: DC

## 2018-10-19 NOTE — Progress Notes (Signed)
Name: Samantha Henderson  MRN/ DOB: [id number], March 02, 1935    Age/ Sex: 84 y.o., female     PCP: Leighton Ruff, MD   Reason for Endocrinology Evaluation: Postoperative Hypothyroidism      Initial Endocrinology Clinic Visit: 02/22/2018    PATIENT IDENTIFIER: Samantha Henderson is a 84 y.o., female with a past medical history of HTN, temporal arteritis and A.Fib. She has followed with [facility] since 02/22/2018 for consultative assistance with management of her Hypothyroidism .   HISTORICAL SUMMARY: . She is S/P surgical hypothyroidism in 1961 due to [place]. She was always on Levothyroxine 112 mcg up until 2019, when her doses have been gradually reduced due to low TS. On her initial visit to our office she was on Levothyroxine 50 mcg daily with symptomatic hypothyroidism. She was on Biotin at the time. We have asked her to hold it for a few days with repeat TFT's consistent with hypothyroidism TSH 15.61 uIU/mL . We started her on Levothyroxine 100 mcg daily and she was asked to hold Biotin for at least 3 days prior to her lab work in the future.    SUBJECTIVE:    Today (10/19/2018):  Samantha Henderson is here for her 2 month follow up on hypothyroidism. She has been taking LT-4 replacement consistently and as prescribed. She has been feeling much better on current dose of LT-4 replacement.     She has chronic constipation , takes miralax regularly.  Denies depression or anxeity Denies sob or cough, no chest pain Denies local neck symptoms   ROS:  As per HPI.   HISTORY:  Past Medical History:  Past Medical History:  Diagnosis Date  . Abdominal bruit    Abdominal bruit in the past no aortic aneurysm by evaluation in the past  . Aortic regurgitation    Moderate by echo 05/2016  . Arm weakness    Arm weakness with exercise, January, 2012  . Benign essential HTN 01/27/2017  . Bradycardia   . Constipation    Constipation with diltiazem March, 2012  . Diastolic dysfunction    Mild  . Dizziness   . Ejection fraction 2011   55-60% ejection fraction, echo, January, 2012  . HTN (hypertension)   . Hypothyroidism   . Leg cramps    from lipitor in the past  . Mitral regurgitation    echo..04/2007  /  mild..04/2010  . PAC (premature atrial contraction)   . Palpitation   . Pericardial effusion    small...posterior...echo..04/2007  . Polymyalgia ([place])   . Pulmonary hypertension ([place])   . Rheumatic fever    Questionable history of rheumatic fever, but no history of valvular abnormalities  . Sinus tachycardia   . Temporal arteritis (HCC)    Rule out temporal arteritis in the past  . Tricuspid regurgitation    -moderate...echo..04/2007...Marland KitchenMarland KitchenEF  60%..echo..04/2007  /   55-60%....echo..05/21/2010   Past Surgical History:  Past Surgical History:  Procedure Laterality Date  . BACK SURGERY    . BUNIONECTOMY    . HIP PINNING,CANNULATED Left 04/12/2016   Procedure: CANNULATED HIP PINNING;  Surgeon: Marchia Bond, MD;  Location: [facility];  Service: Orthopedics;  Laterality: Left;  . NECK SURGERY    . TOTAL ABDOMINAL HYSTERECTOMY      Social History:  reports that she has never smoked. She has never used smokeless tobacco. She reports that she does not drink alcohol or use drugs.  Family History: family history includes Heart disease in her mother; Stroke  in her mother.   HOME MEDICATIONS: Levothyroxine 100 mcg daily     OBJECTIVE:   PHYSICAL EXAM: VS: There were no vitals taken for this visit.   EXAM: General: Pt appears well and is in NAD  Neck: General: Supple without adenopathy. Thyroid: Thyroid size normal.  No goiter or nodules appreciated. No thyroid bruit.  Lungs: Clear with good BS bilat with no rales, rhonchi, or wheezes  Heart: Auscultation: RRR.  Abdomen: Normoactive bowel sounds, soft, nontender, without masses or organomegaly palpable  Extremities:  BL LE: No pretibial edema normal ROM and strength.  Skin: Hair: Texture and amount normal with  gender appropriate distribution Skin Inspection: No rashes. Skin temperature, texture, and thickness normal to palpation  Neuro: Cranial nerves: II - XII grossly intact  Motor: Normal strength throughout DTRs: 2+ and symmetric in UE without delay in relaxation phase  Mental Status: Judgment, insight: Intact Orientation: Oriented to time, place, and person Mood and affect: No depression, anxiety, or agitation     DATA REVIEWED:  Results for Samantha Henderson, Samantha Henderson (MRN [id number]) as of 10/19/2018 15:23  Ref. Range 04/19/2018 09:33 10/19/2018 09:24  TSH Latest Ref Range: 0.35 - 4.50 uIU/mL 0.84 0.57  T4,Free(Direct) Latest Ref Range: 0.60 - 1.60 ng/dL 1.35 1.32      ASSESSMENT / PLAN / RECOMMENDATIONS:   1. Post-Operative Hypothyroidism :  - She is clinically and biochemically euthyroid  But due to the fact that her TSh keeps trending down, I suggest we reduce her dose of levothyroxine to keep her TSH at a goal ~ 2.5 uIU/mL  - She denies any local neck symptoms  - Pt educated extensively on the correct way to take levothyroxine (first thing in the morning with water, 30 minutes before eating or taking other medications). - Pt encouraged to double dose the following day if she were to miss a dose given long half-life of levothyroxine.   Medications  Decrease Levothyroxine to 88 mcg daily   Addendum: a telephone message left for the patient 10/19/18 @ 15:25   F/U in 1 yr   Signed electronically by: Mack Guise, MD  [facility]  [facility] [address] [place], [place] [address] Phone: [phone] FAX: [phone]      CC: Leighton Ruff, [place] [address] Phone: [phone]  Fax: [phone]   Return to Endocrinology clinic as below: Future Appointments  Date Time Provider [place]  10/19/2018  9:10 AM Solly Derasmo, Melanie Crazier, MD [facility] None  10/26/2018  8:45 AM Hayden Pedro, MD [facility] None  11/15/2018 12:40 PM Sueanne Margarita, MD [facility]

## 2018-10-19 NOTE — Patient Instructions (Signed)

## 2018-10-25 DIAGNOSIS — Z79891 Long term (current) use of opiate analgesic: Secondary | ICD-10-CM | POA: Diagnosis not present

## 2018-10-25 DIAGNOSIS — M48061 Spinal stenosis, lumbar region without neurogenic claudication: Secondary | ICD-10-CM | POA: Diagnosis not present

## 2018-10-25 DIAGNOSIS — M961 Postlaminectomy syndrome, not elsewhere classified: Secondary | ICD-10-CM | POA: Diagnosis not present

## 2018-10-25 DIAGNOSIS — M5136 Other intervertebral disc degeneration, lumbar region: Secondary | ICD-10-CM | POA: Diagnosis not present

## 2018-10-26 ENCOUNTER — Encounter (INDEPENDENT_AMBULATORY_CARE_PROVIDER_SITE_OTHER): Payer: Medicare HMO | Admitting: Ophthalmology

## 2018-10-26 ENCOUNTER — Encounter (INDEPENDENT_AMBULATORY_CARE_PROVIDER_SITE_OTHER): Payer: Self-pay

## 2018-10-26 ENCOUNTER — Other Ambulatory Visit: Payer: Self-pay

## 2018-10-26 DIAGNOSIS — H33301 Unspecified retinal break, right eye: Secondary | ICD-10-CM

## 2018-10-27 DIAGNOSIS — R300 Dysuria: Secondary | ICD-10-CM | POA: Diagnosis not present

## 2018-10-27 DIAGNOSIS — D0439 Carcinoma in situ of skin of other parts of face: Secondary | ICD-10-CM | POA: Diagnosis not present

## 2018-10-27 DIAGNOSIS — N3001 Acute cystitis with hematuria: Secondary | ICD-10-CM | POA: Diagnosis not present

## 2018-10-27 DIAGNOSIS — N949 Unspecified condition associated with female genital organs and menstrual cycle: Secondary | ICD-10-CM | POA: Diagnosis not present

## 2018-10-27 DIAGNOSIS — R3915 Urgency of urination: Secondary | ICD-10-CM | POA: Diagnosis not present

## 2018-10-28 DIAGNOSIS — H40003 Preglaucoma, unspecified, bilateral: Secondary | ICD-10-CM | POA: Diagnosis not present

## 2018-11-02 DIAGNOSIS — R69 Illness, unspecified: Secondary | ICD-10-CM | POA: Diagnosis not present

## 2018-11-03 DIAGNOSIS — H401131 Primary open-angle glaucoma, bilateral, mild stage: Secondary | ICD-10-CM | POA: Diagnosis not present

## 2018-11-15 ENCOUNTER — Other Ambulatory Visit: Payer: Self-pay

## 2018-11-15 ENCOUNTER — Ambulatory Visit (INDEPENDENT_AMBULATORY_CARE_PROVIDER_SITE_OTHER): Payer: Medicare HMO | Admitting: Cardiology

## 2018-11-15 ENCOUNTER — Encounter: Payer: Self-pay | Admitting: Cardiology

## 2018-11-15 VITALS — BP 108/60 | HR 56 | Ht 66.0 in | Wt 134.4 lb

## 2018-11-15 DIAGNOSIS — I4819 Other persistent atrial fibrillation: Secondary | ICD-10-CM

## 2018-11-15 DIAGNOSIS — I351 Nonrheumatic aortic (valve) insufficiency: Secondary | ICD-10-CM | POA: Diagnosis not present

## 2018-11-15 DIAGNOSIS — I10 Essential (primary) hypertension: Secondary | ICD-10-CM

## 2018-11-15 DIAGNOSIS — I340 Nonrheumatic mitral (valve) insufficiency: Secondary | ICD-10-CM

## 2018-11-15 HISTORY — DX: Nonrheumatic mitral (valve) insufficiency: I34.0

## 2018-11-15 MED ORDER — DILTIAZEM HCL ER COATED BEADS 120 MG PO CP24: 120.0000 mg | ORAL_CAPSULE | Freq: Every day | ORAL | 3 refills | Status: DC

## 2018-11-15 MED ORDER — APIXABAN 5 MG PO TABS: 5.0000 mg | ORAL_TABLET | Freq: Two times a day (BID) | ORAL | 3 refills | Status: DC

## 2018-11-15 NOTE — Progress Notes (Addendum)
Cardiology Office Note:    Date:  11/15/2018   ID:  Samantha Henderson, DOB [birth year], MRN [id number]  PCP:  Leighton Ruff, MD  Cardiologist:  No primary care provider on file.    Referring MD: Leighton Ruff, MD   Chief Complaint  Patient presents with  . Atrial Fibrillation  . Hypertension    History of Present Illness:    Samantha Henderson is a 84 y.o. female with a hx of persistent atrial fibrillation,moderate ARby 2D echocardiogram 06/10/2016, mild MR and HTN.  She is here today for followup and is doing well.  She denies any chest pain or pressure, SOB, DOE, PND, orthopnea,  dizziness, palpitations or syncope. She has had some problems with fatigue recently as well as intermittent ankle edema.  She has RA and lupuse and her rheumatologist placed her on Celebrex recently.  She also admits to eating chips at times. She is compliant with her meds and is tolerating meds with no SE.    Past Medical History:  Diagnosis Date  . Abdominal bruit    Abdominal bruit in the past no aortic aneurysm by evaluation in the past  . Aortic regurgitation    Moderate by echo 05/2016  . Arm weakness    Arm weakness with exercise, January, 2012  . Benign essential HTN 01/27/2017  . Bradycardia   . Constipation    Constipation with diltiazem March, 2012  . Diastolic dysfunction    Mild  . Dizziness   . Ejection fraction 2011   55-60% ejection fraction, echo, January, 2012  . HTN (hypertension)   . Hypothyroidism   . Leg cramps    from lipitor in the past  . Mitral regurgitation    echo..04/2007  /  mild..04/2010  . Mitral regurgitation 11/15/2018   Mild to moderate by echo 09/2018  . PAC (premature atrial contraction)   . Palpitation   . Pericardial effusion    small...posterior...echo..04/2007  . Polymyalgia ([place])   . Pulmonary hypertension ([place])   . Rheumatic fever    Questionable history of rheumatic fever, but no history of valvular abnormalities  . Sinus tachycardia   . Temporal  arteritis (HCC)    Rule out temporal arteritis in the past  . Tricuspid regurgitation    -moderate...echo..04/2007...Marland KitchenMarland KitchenEF  60%..echo..04/2007  /   55-60%....echo..05/21/2010    Past Surgical History:  Procedure Laterality Date  . BACK SURGERY    . BUNIONECTOMY    . HIP PINNING,CANNULATED Left 04/12/2016   Procedure: CANNULATED HIP PINNING;  Surgeon: Marchia Bond, MD;  Location: [facility];  Service: Orthopedics;  Laterality: Left;  . NECK SURGERY    . TOTAL ABDOMINAL HYSTERECTOMY      Current Medications: Current Meds  Medication Sig  . Acetaminophen (TYLENOL PO) Take 650 mg by mouth as needed.  Marland Kitchen apixaban (ELIQUIS) 2.5 MG TABS tablet Take 1 tablet (2.5 mg total) by mouth 2 (two) times daily.  Marland Kitchen atorvastatin (LIPITOR) 20 MG tablet Take 20 mg by mouth daily.   . celecoxib (CELEBREX) 200 MG capsule Take 1 capsule by mouth 2 (two) times daily with a meal.  . Cholecalciferol (VITAMIN D3) 1000 UNITS CAPS Take 1 capsule by mouth daily. 1 tab po qd  . diltiazem (CARDIZEM CD) 180 MG 24 hr capsule TAKE 1 CAPSULE DAILY  . diltiazem (CARDIZEM) 30 MG tablet Take 1 tablet (30 mg total) by mouth daily as needed. For breakthrough atrial fibrillation.  . fish oil-omega-3 fatty acids 1000 MG capsule Take 1 g  by mouth daily.    . hydrochlorothiazide 25 MG tablet Take 25 mg by mouth daily.   Marland Kitchen levothyroxine (SYNTHROID) 88 MCG tablet Take 1 tablet (88 mcg total) by mouth daily.  Marland Kitchen omeprazole (PRILOSEC OTC) 20 MG tablet Take 20 mg by mouth 2 (two) times daily.  . polyethylene glycol (MIRALAX / GLYCOLAX) packet Take 17 g by mouth daily.  . traMADol (ULTRAM) 50 MG tablet Take 1 tablet (50 mg total) by mouth every 6 (six) hours as needed.  . Turmeric 500 MG CAPS Take 500 mg by mouth daily.     Allergies:   Cephalexin, Ciprofloxacin, Lisinopril, Sulfamethoxazole-trimethoprim, and Sulfonamide derivatives   Social History   Socioeconomic History  . Marital status: Widowed    Spouse name: Not on file  .  Number of children: Not on file  . Years of education: Not on file  . Highest education level: Not on file  Occupational History  . Not on file  Social Needs  . Financial resource strain: Not on file  . Food insecurity    Worry: Not on file    Inability: Not on file  . Transportation needs    Medical: Not on file    Non-medical: Not on file  Tobacco Use  . Smoking status: Never Smoker  . Smokeless tobacco: Never Used  Substance and Sexual Activity  . Alcohol use: No  . Drug use: No  . Sexual activity: Not on file  Lifestyle  . Physical activity    Days per week: Not on file    Minutes per session: Not on file  . Stress: Not on file  Relationships  . Social Herbalist on phone: Not on file    Gets together: Not on file    Attends religious service: Not on file    Active member of club or organization: Not on file    Attends meetings of clubs or organizations: Not on file    Relationship status: Not on file  Other Topics Concern  . Not on file  Social History Narrative  . Not on file     Family History: The patient's family history includes Heart disease in her mother; Stroke in her mother.  ROS:   Please see the history of present illness.    ROS  All other systems reviewed and negative.   EKGs/Labs/Other Studies Reviewed:    The following studies were reviewed today: none  EKG:  EKG is ordered today and showed sinus bradycardia  Recent Labs: 02/16/2018: Hemoglobin 12.7; Platelets CANCELED 03/02/2018: BUN 12; Creatinine, Ser 1.12; Potassium 3.9; Sodium 133 10/19/2018: TSH 0.57   Recent Lipid Panel No results found for: CHOL, TRIG, HDL, CHOLHDL, VLDL, LDLCALC, LDLDIRECT  Physical Exam:    VS:  BP 108/60   Pulse (!) 56   Ht 5\' 6"  (1.676 m)   Wt 134 lb 6.4 oz (61 kg)   SpO2 94%   BMI 21.69 kg/m     Wt Readings from Last 3 Encounters:  11/15/18 134 lb 6.4 oz (61 kg)  10/19/18 133 lb 6.4 oz (60.5 kg)  04/19/18 132 lb (59.9 kg)     GEN:   Well nourished, well developed in no acute distress HEENT: Normal NECK: No JVD; No carotid bruits LYMPHATICS: No lymphadenopathy CARDIAC: RRR, no murmurs, rubs, gallops RESPIRATORY:  Clear to auscultation without rales, wheezing or rhonchi  ABDOMEN: Soft, non-tender, non-distended MUSCULOSKELETAL:  No edema; No deformity  SKIN: Warm and dry NEUROLOGIC:  Alert  and oriented x 3 PSYCHIATRIC:  Normal affect   ASSESSMENT:    1. Persistent atrial fibrillation ([place])   2. Benign essential HTN   3. Nonrheumatic aortic valve insufficiency   4. Nonrheumatic mitral valve regurgitation    PLAN:    In order of problems listed above:  1.  Persistent atrial fibrillation -she continues to remain in NSR -decrease Cardizem CD from 180mg  to 120mg  daily due to bradycardia and fatigue -no bleeding problems on DOAC -increase Eliquis to 5mg  BID now that weight is > 60kg and Creatinine has been < 1.5 -repeat BMET today -she was started on Celebrex by her Rheumatologist which I have asked her to stop due to taking the [place].  I have recommended she followup with her rheumatologist to see what other pain med she can take  2.  Hypertension -BP is well controlled and actually on the soft side -continue Cardizem CD but decreasing to 120mg  daily due to fatigue, bradycardia and soft BP and  Continue HCTZ 25mg  daily  3.  Aortic insufficiency -mild by echo 09/2018  4. Mitral regurgitation -mild to moderate by echo 09/2018 -repeat echo in 1 year to make sure this is stable  5.  LE edema -she has mild ankle edema on exam -likely from NSAIDs and add sodium in diet -encouraged her to avoid chips and high sodium foods -also recommended she stop Celebrex   Medication Adjustments/Labs and Tests Ordered: Current medicines are reviewed at length with the patient today.  Concerns regarding medicines are outlined above.  Orders Placed This Encounter  Procedures  . EKG 12-Lead   No orders of the defined types  were placed in this encounter.   Signed, Fransico Him, MD  11/15/2018 1:06 PM    [place]

## 2018-11-15 NOTE — Patient Instructions (Signed)
Medication Instructions:  1) INCREASE ELIQUIS to 5 mg twice daily 2) DECREASE CARDIZEM to 120 mg daily  Labwork: TODAY: BMET  Testing/Procedures: Your provider has requested that you have an echocardiogram in 1 year. Echocardiography is a painless test that uses sound waves to create images of your heart. It provides your doctor with information about the size and shape of your heart and how well your heart's chambers and valves are working. This procedure takes approximately one hour. There are no restrictions for this procedure.  Follow-Up: Your provider wants you to follow-up in: 6 months with Dr. Radford Pax for a virtual visit You will receive a reminder letter in the mail two months in advance. If you don't receive a letter, please call our office to schedule the follow-up appointment.

## 2018-11-16 ENCOUNTER — Telehealth: Payer: Self-pay

## 2018-11-16 DIAGNOSIS — H401111 Primary open-angle glaucoma, right eye, mild stage: Secondary | ICD-10-CM | POA: Diagnosis not present

## 2018-11-16 DIAGNOSIS — I10 Essential (primary) hypertension: Secondary | ICD-10-CM

## 2018-11-16 LAB — BASIC METABOLIC PANEL
BUN/Creatinine Ratio: 13 (ref 12–28)
BUN: 19 mg/dL (ref 8–27)
CO2: 27 mmol/L (ref 20–29)
Calcium: 9.4 mg/dL (ref 8.7–10.3)
Chloride: 99 mmol/L (ref 96–106)
Creatinine, Ser: 1.5 mg/dL — ABNORMAL HIGH (ref 0.57–1.00)
GFR calc Af Amer: 37 mL/min/{1.73_m2} — ABNORMAL LOW (ref >59)
GFR calc non Af Amer: 32 mL/min/{1.73_m2} — ABNORMAL LOW (ref >59)
Glucose: 106 mg/dL — ABNORMAL HIGH (ref 65–99)
Potassium: 4 mmol/L (ref 3.5–5.2)
Sodium: 141 mmol/L (ref 134–144)

## 2018-11-16 NOTE — Telephone Encounter (Signed)
-----   Message from Sueanne Margarita, MD sent at 11/16/2018 10:24 AM EDT ----- Creatinine has bumped likely related to Celebrex.  Please have her stop Celebrex as recommended at [place] recently as well as stop HCTZ.  Have her check her BP daily for a week and call with results.  Repeat BMET in 1 week.

## 2018-11-18 DIAGNOSIS — M961 Postlaminectomy syndrome, not elsewhere classified: Secondary | ICD-10-CM | POA: Diagnosis not present

## 2018-11-18 DIAGNOSIS — M5136 Other intervertebral disc degeneration, lumbar region: Secondary | ICD-10-CM | POA: Diagnosis not present

## 2018-11-25 ENCOUNTER — Other Ambulatory Visit: Payer: Medicare HMO | Admitting: *Deleted

## 2018-11-25 ENCOUNTER — Other Ambulatory Visit: Payer: Self-pay

## 2018-11-25 DIAGNOSIS — I10 Essential (primary) hypertension: Secondary | ICD-10-CM | POA: Diagnosis not present

## 2018-11-25 LAB — BASIC METABOLIC PANEL
BUN/Creatinine Ratio: 13 (ref 12–28)
BUN: 17 mg/dL (ref 8–27)
CO2: 23 mmol/L (ref 20–29)
Calcium: 9 mg/dL (ref 8.7–10.3)
Chloride: 103 mmol/L (ref 96–106)
Creatinine, Ser: 1.32 mg/dL — ABNORMAL HIGH (ref 0.57–1.00)
GFR calc Af Amer: 44 mL/min/{1.73_m2} — ABNORMAL LOW (ref >59)
GFR calc non Af Amer: 38 mL/min/{1.73_m2} — ABNORMAL LOW (ref >59)
Glucose: 60 mg/dL — ABNORMAL LOW (ref 65–99)
Potassium: 4.3 mmol/L (ref 3.5–5.2)
Sodium: 141 mmol/L (ref 134–144)

## 2018-11-26 ENCOUNTER — Telehealth: Payer: Self-pay | Admitting: Cardiology

## 2018-11-26 NOTE — Telephone Encounter (Signed)
Returned pts call and she has been made aware of her lab results. See result note. 

## 2018-11-26 NOTE — Telephone Encounter (Signed)
-----   Message from Sueanne Margarita, MD sent at 11/25/2018  4:52 PM EDT ----- Creatinine improved after stopping Celebrex and HCTZ - has she checked her BP?

## 2018-11-26 NOTE — Telephone Encounter (Signed)
Pt dropped off BPs at time of her lab appt.  They are as follows:  6P- 148/70, 62 8:30A- 134/62, 63 6P- 129/69, 56 2:50P- 153/75, 73 8A- 127/53, 76 9P- 123/62, 101 9:45A- 126/62, 78 8:45A- 131/53, 54 8:30P- 137/68, 59 8A- 137/70. 72 8:12A- 134/66, 65 11:30A- 129/65, 71 5:18P- 132/78, 67  There were no dates written on the paper.  Will route to Dr. Radford Pax.

## 2018-11-26 NOTE — Telephone Encounter (Signed)
For most part BP looks good off diuretic and lower dose of Cardizem.  Please find out how she is feeling

## 2018-11-26 NOTE — Telephone Encounter (Signed)
New Message   Patient states that she is returning call she believes that its about her labs. Please call.

## 2018-11-26 NOTE — Telephone Encounter (Signed)
Left detailed message per DPR.  Advised BP looked good with med change.  Advised Dr. Radford Pax would like to know how she was feeling.  Requested Pt call office to report and ask for triage nurse.

## 2018-11-30 DIAGNOSIS — H401121 Primary open-angle glaucoma, left eye, mild stage: Secondary | ICD-10-CM | POA: Diagnosis not present

## 2018-12-06 DIAGNOSIS — M5136 Other intervertebral disc degeneration, lumbar region: Secondary | ICD-10-CM | POA: Diagnosis not present

## 2018-12-06 DIAGNOSIS — M961 Postlaminectomy syndrome, not elsewhere classified: Secondary | ICD-10-CM | POA: Diagnosis not present

## 2018-12-06 DIAGNOSIS — M5030 Other cervical disc degeneration, unspecified cervical region: Secondary | ICD-10-CM | POA: Diagnosis not present

## 2018-12-23 ENCOUNTER — Telehealth: Payer: Self-pay | Admitting: Cardiology

## 2018-12-23 NOTE — Telephone Encounter (Signed)
°  Pt c/o swelling: STAT is pt has developed SOB within 24 hours  1) How much weight have you gained and in what time span? Pt does not keep a weight log  2) If swelling, where is the swelling located? Left leg only  3) Are you currently taking a fluid pill? no. Pt states Dr. Radford Pax took her off of it last month  4) Are you currently SOB? no  5) Do you have a log of your daily weights (if so, list)? no  6) Have you gained 3 pounds in a day or 5 pounds in a week? Pt has not kept a weight log  7) Have you traveled recently? no   Pt called to let Dr. Radford Pax know that her leg remains swollen like it was at her last appointment. Pt does not know if any of the med changes are keeping her leg swollen or not. Pt wants to know what Dr. Radford Pax wants her to do to reduce the swelling.

## 2018-12-23 NOTE — Telephone Encounter (Signed)
Diuretic was stopped due to worsening renal function.  I would like her to see her PCP tomorrow (Friday) to make sure nothing else is going on (ie: DVT).

## 2018-12-23 NOTE — Telephone Encounter (Signed)
I spoke to the patient who called because her Left leg continues to be swollen from her visit on 7/27 with Dr Radford Pax.  She has bilateral swelling, but left is worse.  It decreases at night, but swells during the day.  It does not hurt, but is "touchy".     She is not SOB and denies CP. She has decreased her salt intake, but not noticing change.  Her HCTZ was discontinued recently and was calling to see if she needs to resume.  She will continue to monitor.  Please advise, thank you.

## 2018-12-24 DIAGNOSIS — Z23 Encounter for immunization: Secondary | ICD-10-CM | POA: Diagnosis not present

## 2018-12-24 DIAGNOSIS — I10 Essential (primary) hypertension: Secondary | ICD-10-CM | POA: Diagnosis not present

## 2018-12-24 DIAGNOSIS — I868 Varicose veins of other specified sites: Secondary | ICD-10-CM | POA: Diagnosis not present

## 2018-12-24 NOTE — Telephone Encounter (Signed)
I spoke to the patient with Dr Theodosia Blender recommendation.  She verbalized understanding.

## 2018-12-28 DIAGNOSIS — M5136 Other intervertebral disc degeneration, lumbar region: Secondary | ICD-10-CM | POA: Diagnosis not present

## 2018-12-30 DIAGNOSIS — M154 Erosive (osteo)arthritis: Secondary | ICD-10-CM | POA: Diagnosis not present

## 2018-12-30 DIAGNOSIS — M1991 Primary osteoarthritis, unspecified site: Secondary | ICD-10-CM | POA: Diagnosis not present

## 2018-12-30 DIAGNOSIS — M2550 Pain in unspecified joint: Secondary | ICD-10-CM | POA: Diagnosis not present

## 2018-12-30 DIAGNOSIS — M329 Systemic lupus erythematosus, unspecified: Secondary | ICD-10-CM | POA: Diagnosis not present

## 2018-12-30 DIAGNOSIS — M0579 Rheumatoid arthritis with rheumatoid factor of multiple sites without organ or systems involvement: Secondary | ICD-10-CM | POA: Diagnosis not present

## 2018-12-30 DIAGNOSIS — Z6821 Body mass index (BMI) 21.0-21.9, adult: Secondary | ICD-10-CM | POA: Diagnosis not present

## 2018-12-30 DIAGNOSIS — Z79899 Other long term (current) drug therapy: Secondary | ICD-10-CM | POA: Diagnosis not present

## 2019-01-10 DIAGNOSIS — H0102B Squamous blepharitis left eye, upper and lower eyelids: Secondary | ICD-10-CM | POA: Diagnosis not present

## 2019-01-10 DIAGNOSIS — H401131 Primary open-angle glaucoma, bilateral, mild stage: Secondary | ICD-10-CM | POA: Diagnosis not present

## 2019-01-10 DIAGNOSIS — H0102A Squamous blepharitis right eye, upper and lower eyelids: Secondary | ICD-10-CM | POA: Diagnosis not present

## 2019-01-12 DIAGNOSIS — Z79891 Long term (current) use of opiate analgesic: Secondary | ICD-10-CM | POA: Diagnosis not present

## 2019-01-12 DIAGNOSIS — M5030 Other cervical disc degeneration, unspecified cervical region: Secondary | ICD-10-CM | POA: Diagnosis not present

## 2019-01-12 DIAGNOSIS — M961 Postlaminectomy syndrome, not elsewhere classified: Secondary | ICD-10-CM | POA: Diagnosis not present

## 2019-01-12 DIAGNOSIS — M5136 Other intervertebral disc degeneration, lumbar region: Secondary | ICD-10-CM | POA: Diagnosis not present

## 2019-02-03 ENCOUNTER — Other Ambulatory Visit (INDEPENDENT_AMBULATORY_CARE_PROVIDER_SITE_OTHER): Payer: Medicare HMO

## 2019-02-03 ENCOUNTER — Other Ambulatory Visit: Payer: Self-pay

## 2019-02-03 DIAGNOSIS — M961 Postlaminectomy syndrome, not elsewhere classified: Secondary | ICD-10-CM | POA: Diagnosis not present

## 2019-02-03 DIAGNOSIS — E890 Postprocedural hypothyroidism: Secondary | ICD-10-CM | POA: Diagnosis not present

## 2019-02-03 DIAGNOSIS — M5136 Other intervertebral disc degeneration, lumbar region: Secondary | ICD-10-CM | POA: Diagnosis not present

## 2019-02-03 LAB — TSH: TSH: 7.5 u[IU]/mL — ABNORMAL HIGH (ref 0.35–4.50)

## 2019-02-03 LAB — T4, FREE: Free T4: 1.02 ng/dL (ref 0.60–1.60)

## 2019-02-04 ENCOUNTER — Encounter: Payer: Self-pay | Admitting: Internal Medicine

## 2019-02-04 DIAGNOSIS — E890 Postprocedural hypothyroidism: Secondary | ICD-10-CM

## 2019-02-08 ENCOUNTER — Telehealth: Payer: Self-pay | Admitting: Cardiology

## 2019-02-08 MED ORDER — APIXABAN 5 MG PO TABS: 5.0000 mg | ORAL_TABLET | Freq: Two times a day (BID) | ORAL | 3 refills | Status: DC

## 2019-02-08 NOTE — Telephone Encounter (Signed)
The patient had a CPP appt with [facility] at [facility] this morning. The patient reported a change in the dosage of her Eliquis from 2.5 mg 2x daily to 5 mg 2x daily. The patient has plenty of the 2.5 mg tablets for now, but will run out in a month or so. The patient gets her Eliquis through [facility], not the regular pharmacy.  [facility] has the fax number for Dr. Radford Pax to send the new Eliquis rx to [facility]. [phone]  Dr. Radford Pax will just need to send a new rx there within the next 1-2 weeks sp that the patient does not run out of medication

## 2019-02-08 NOTE — Telephone Encounter (Signed)
Prescription printed and will fax to [place] at [phone].

## 2019-02-08 NOTE — Telephone Encounter (Signed)
Patient returned call.  She states she will be out for the rest of the day, please call her back later this afternoon.

## 2019-02-08 NOTE — Telephone Encounter (Signed)
Left a message for the pt to call back to obtain more information as to why this med was increased, and to further assist her with getting this prescription faxed to [facility], so that she doesn't run out.  Left her a detailed message to call the office back and request to speak with a triage nurse onsite.

## 2019-02-08 NOTE — Telephone Encounter (Signed)
Yes she needs to be on 5mg  BID

## 2019-02-08 NOTE — Telephone Encounter (Signed)
Dr. Radford Pax, are you ok with triage printing off the pts Eliquis dose of 5 mg po bid and faxing this to her pharmacy [facility]? She called in earlier, as mentioned in this message, to inform you that she went to her [facility], and dose change of Eliquis 2.5 mg po bid was increased to 5 mg po bid, but it appears you made the increase.  Please advise if you want her to go on increased dose of Eliquis 5 mg po bid, NOT 2.5 mg bid, and advise if this is okay to print off by triage and use your stamp to fax to [facility].  Pt will soon run out of her 2.5 mg tabs and will need the 5's sent in, so no skipped doses occur. Please advise!

## 2019-02-08 NOTE — Addendum Note (Signed)
Addended by: Rodman Key on: 02/08/2019 05:08 PM   Modules accepted: Orders

## 2019-02-14 DIAGNOSIS — R52 Pain, unspecified: Secondary | ICD-10-CM | POA: Diagnosis not present

## 2019-02-14 DIAGNOSIS — M961 Postlaminectomy syndrome, not elsewhere classified: Secondary | ICD-10-CM | POA: Diagnosis not present

## 2019-02-14 DIAGNOSIS — M5030 Other cervical disc degeneration, unspecified cervical region: Secondary | ICD-10-CM | POA: Diagnosis not present

## 2019-02-14 DIAGNOSIS — M4696 Unspecified inflammatory spondylopathy, lumbar region: Secondary | ICD-10-CM | POA: Diagnosis not present

## 2019-02-16 DIAGNOSIS — N183 Chronic kidney disease, stage 3 unspecified: Secondary | ICD-10-CM | POA: Diagnosis not present

## 2019-02-16 DIAGNOSIS — I10 Essential (primary) hypertension: Secondary | ICD-10-CM | POA: Diagnosis not present

## 2019-02-16 DIAGNOSIS — M069 Rheumatoid arthritis, unspecified: Secondary | ICD-10-CM | POA: Diagnosis not present

## 2019-02-16 DIAGNOSIS — E039 Hypothyroidism, unspecified: Secondary | ICD-10-CM | POA: Diagnosis not present

## 2019-02-16 DIAGNOSIS — E7800 Pure hypercholesterolemia, unspecified: Secondary | ICD-10-CM | POA: Diagnosis not present

## 2019-02-16 DIAGNOSIS — I480 Paroxysmal atrial fibrillation: Secondary | ICD-10-CM | POA: Diagnosis not present

## 2019-02-22 DIAGNOSIS — M069 Rheumatoid arthritis, unspecified: Secondary | ICD-10-CM | POA: Diagnosis not present

## 2019-02-22 DIAGNOSIS — I10 Essential (primary) hypertension: Secondary | ICD-10-CM | POA: Diagnosis not present

## 2019-02-22 DIAGNOSIS — I480 Paroxysmal atrial fibrillation: Secondary | ICD-10-CM | POA: Diagnosis not present

## 2019-02-22 DIAGNOSIS — E7800 Pure hypercholesterolemia, unspecified: Secondary | ICD-10-CM | POA: Diagnosis not present

## 2019-02-22 DIAGNOSIS — N183 Chronic kidney disease, stage 3 unspecified: Secondary | ICD-10-CM | POA: Diagnosis not present

## 2019-02-22 DIAGNOSIS — E039 Hypothyroidism, unspecified: Secondary | ICD-10-CM | POA: Diagnosis not present

## 2019-02-28 ENCOUNTER — Encounter (INDEPENDENT_AMBULATORY_CARE_PROVIDER_SITE_OTHER): Payer: Medicare HMO | Admitting: Ophthalmology

## 2019-02-28 DIAGNOSIS — H33303 Unspecified retinal break, bilateral: Secondary | ICD-10-CM

## 2019-02-28 DIAGNOSIS — H43813 Vitreous degeneration, bilateral: Secondary | ICD-10-CM | POA: Diagnosis not present

## 2019-02-28 DIAGNOSIS — I10 Essential (primary) hypertension: Secondary | ICD-10-CM | POA: Diagnosis not present

## 2019-02-28 DIAGNOSIS — H35033 Hypertensive retinopathy, bilateral: Secondary | ICD-10-CM

## 2019-03-29 ENCOUNTER — Other Ambulatory Visit: Payer: Self-pay

## 2019-03-29 ENCOUNTER — Other Ambulatory Visit (INDEPENDENT_AMBULATORY_CARE_PROVIDER_SITE_OTHER): Payer: Medicare HMO

## 2019-03-29 DIAGNOSIS — T23009A Burn of unspecified degree of unspecified hand, unspecified site, initial encounter: Secondary | ICD-10-CM | POA: Diagnosis not present

## 2019-03-29 DIAGNOSIS — E890 Postprocedural hypothyroidism: Secondary | ICD-10-CM

## 2019-03-29 LAB — T4, FREE: Free T4: 1.19 ng/dL (ref 0.60–1.60)

## 2019-03-29 LAB — TSH: TSH: 4.51 u[IU]/mL — ABNORMAL HIGH (ref 0.35–4.50)

## 2019-03-30 ENCOUNTER — Telehealth: Payer: Self-pay | Admitting: Internal Medicine

## 2019-03-30 NOTE — Telephone Encounter (Signed)
PLease let her know that her thyroid is improving but not completely normal yet.    Please ask her if she is using a pill box. If she is not, I strongly suggest she starts using one , fills it out every Sunday . But if she is using one already, please let me know and I will adjust her dose.    Please schedule her to see me in 2 months     Thanks    Hinckley, MD  [facility] Endocrinology  [facility] [address] [place], [place] [address] Phone: [phone] FAX: [phone]

## 2019-03-30 NOTE — Telephone Encounter (Signed)
Pt informed and pt scheduled for repeat labs

## 2019-03-31 DIAGNOSIS — M47816 Spondylosis without myelopathy or radiculopathy, lumbar region: Secondary | ICD-10-CM | POA: Diagnosis not present

## 2019-03-31 DIAGNOSIS — M47896 Other spondylosis, lumbar region: Secondary | ICD-10-CM | POA: Diagnosis not present

## 2019-04-09 DIAGNOSIS — Z03818 Encounter for observation for suspected exposure to other biological agents ruled out: Secondary | ICD-10-CM | POA: Diagnosis not present

## 2019-04-11 DIAGNOSIS — Z7189 Other specified counseling: Secondary | ICD-10-CM | POA: Diagnosis not present

## 2019-04-25 DIAGNOSIS — E039 Hypothyroidism, unspecified: Secondary | ICD-10-CM | POA: Diagnosis not present

## 2019-04-25 DIAGNOSIS — I480 Paroxysmal atrial fibrillation: Secondary | ICD-10-CM | POA: Diagnosis not present

## 2019-04-25 DIAGNOSIS — N183 Chronic kidney disease, stage 3 unspecified: Secondary | ICD-10-CM | POA: Diagnosis not present

## 2019-04-25 DIAGNOSIS — I10 Essential (primary) hypertension: Secondary | ICD-10-CM | POA: Diagnosis not present

## 2019-04-25 DIAGNOSIS — E7800 Pure hypercholesterolemia, unspecified: Secondary | ICD-10-CM | POA: Diagnosis not present

## 2019-04-25 DIAGNOSIS — M069 Rheumatoid arthritis, unspecified: Secondary | ICD-10-CM | POA: Diagnosis not present

## 2019-04-26 ENCOUNTER — Other Ambulatory Visit: Payer: Medicare HMO

## 2019-04-26 DIAGNOSIS — H401131 Primary open-angle glaucoma, bilateral, mild stage: Secondary | ICD-10-CM | POA: Diagnosis not present

## 2019-04-26 DIAGNOSIS — H0102A Squamous blepharitis right eye, upper and lower eyelids: Secondary | ICD-10-CM | POA: Diagnosis not present

## 2019-04-26 DIAGNOSIS — H0102B Squamous blepharitis left eye, upper and lower eyelids: Secondary | ICD-10-CM | POA: Diagnosis not present

## 2019-04-28 DIAGNOSIS — M542 Cervicalgia: Secondary | ICD-10-CM | POA: Diagnosis not present

## 2019-04-28 DIAGNOSIS — Z5181 Encounter for therapeutic drug level monitoring: Secondary | ICD-10-CM | POA: Diagnosis not present

## 2019-04-28 DIAGNOSIS — Z79899 Other long term (current) drug therapy: Secondary | ICD-10-CM | POA: Diagnosis not present

## 2019-05-03 DIAGNOSIS — L820 Inflamed seborrheic keratosis: Secondary | ICD-10-CM | POA: Diagnosis not present

## 2019-05-03 DIAGNOSIS — L570 Actinic keratosis: Secondary | ICD-10-CM | POA: Diagnosis not present

## 2019-05-03 DIAGNOSIS — Z85828 Personal history of other malignant neoplasm of skin: Secondary | ICD-10-CM | POA: Diagnosis not present

## 2019-05-10 DIAGNOSIS — R69 Illness, unspecified: Secondary | ICD-10-CM | POA: Diagnosis not present

## 2019-05-12 ENCOUNTER — Ambulatory Visit: Payer: Medicare HMO | Attending: Internal Medicine

## 2019-05-12 DIAGNOSIS — Z23 Encounter for immunization: Secondary | ICD-10-CM | POA: Insufficient documentation

## 2019-05-12 NOTE — Progress Notes (Signed)
   Covid-19 Vaccination Clinic  Name:  Samantha Henderson    MRN: [id number] DOB: Mar 05, 1935  05/12/2019  [name] was observed post Covid-19 immunization for 30 minutes based on pre-vaccination screening without incidence. She was provided with Vaccine Information Sheet and instruction to access the V-Safe system.   [name] was instructed to call 911 with any severe reactions post vaccine: Marland Kitchen Difficulty breathing  . Swelling of your face and throat  . A fast heartbeat  . A bad rash all over your body  . Dizziness and weakness    Immunizations Administered    Name Date Dose VIS Date Route   Pfizer COVID-19 Vaccine 05/12/2019 11:20 AM 0.3 mL 04/01/2019 Intramuscular   Manufacturer: [employer]   Lot: [id number]   [place]: [id number]

## 2019-05-24 NOTE — Progress Notes (Signed)
Cardiology Office Note:    Date:  05/25/2019   ID:  Samantha Henderson, DOB 1934-08-15, MRN [id number]  PCP:  Leighton Ruff, MD  Cardiologist:  No primary care provider on file.    Referring MD: Leighton Ruff, MD   Chief Complaint  Patient presents with  . Atrial Fibrillation  . Hypertension  . Aortic Insuffiency    History of Present Illness:    Samantha Henderson is a 85 y.o. female with a hx of persistent atrial fibrillation,moderate ARby 2D echocardiogram 06/10/2016, mild MR and HTN.  She is here today for followup and is doing well.  She denies any PND, orthopnea,  dizziness or syncope. She tells me that she has been having episodes of tightness that starts in her arms and radiates into her chest that last about a minute but are very severe.  They are not associated with nausea/diaphoresis or SOB.  They usually are associated with exertion.  She also has been having very frequent palpitations several times a week.  She started having LE edema and went back on diuretics which has resolved the issue but has a very large varicose vein on her RLE that is painful.  She is compliant with her meds and is tolerating meds with no SE.    Past Medical History:  Diagnosis Date  . Abdominal bruit    Abdominal bruit in the past no aortic aneurysm by evaluation in the past  . Aortic regurgitation    Moderate by echo 05/2016  . Arm weakness    Arm weakness with exercise, January, 2012  . Benign essential HTN 01/27/2017  . Bradycardia   . Constipation    Constipation with diltiazem March, 2012  . Diastolic dysfunction    Mild  . Dizziness   . Ejection fraction 2011   55-60% ejection fraction, echo, January, 2012  . HTN (hypertension)   . Hypothyroidism   . Leg cramps    from lipitor in the past  . Mitral regurgitation    echo..04/2007  /  mild..04/2010  . Mitral regurgitation 11/15/2018   Mild to moderate by echo 09/2018  . PAC (premature atrial contraction)   . Palpitation   . Pericardial  effusion    small...posterior...echo..04/2007  . Polymyalgia ([place])   . Pulmonary hypertension ([place])   . Rheumatic fever    Questionable history of rheumatic fever, but no history of valvular abnormalities  . Sinus tachycardia   . Temporal arteritis ([facility])    Rule out temporal arteritis in the past  . Tricuspid regurgitation    -moderate...echo..04/2007...Marland KitchenMarland KitchenEF  60%..echo..04/2007  /   55-60%....echo..05/21/2010    Past Surgical History:  Procedure Laterality Date  . BACK SURGERY    . BUNIONECTOMY    . HIP PINNING,CANNULATED Left 04/12/2016   Procedure: CANNULATED HIP PINNING;  Surgeon: Marchia Bond, MD;  Location: [facility];  Service: Orthopedics;  Laterality: Left;  . NECK SURGERY    . TOTAL ABDOMINAL HYSTERECTOMY      Current Medications: Current Meds  Medication Sig  . Acetaminophen (TYLENOL PO) Take 650 mg by mouth as needed.  Marland Kitchen apixaban (ELIQUIS) 5 MG TABS tablet Take 1 tablet (5 mg total) by mouth 2 (two) times daily.  Marland Kitchen atorvastatin (LIPITOR) 20 MG tablet Take 20 mg by mouth daily.   . Cholecalciferol (VITAMIN D3) 1000 UNITS CAPS Take 1 capsule by mouth daily. 1 tab po qd  . diltiazem (CARDIZEM CD) 120 MG 24 hr capsule Take 1 capsule (120 mg total) by mouth  daily.  . diltiazem (CARDIZEM) 30 MG tablet Take 1 tablet (30 mg total) by mouth daily as needed. For breakthrough atrial fibrillation.  Marland Kitchen doxylamine, Sleep, (UNISOM) 25 MG tablet Take 25 mg by mouth at bedtime as needed.  . hydrochlorothiazide (HYDRODIURIL) 25 MG tablet Take by mouth.  . levothyroxine (SYNTHROID) 88 MCG tablet Take 1 tablet (88 mcg total) by mouth daily.  . Magnesium Oxide -Mg Supplement 250 MG TABS Take 1 tablet by mouth daily.  . Melatonin 10 MG TABS Take 10 mg by mouth at bedtime.  . polyethylene glycol (MIRALAX / GLYCOLAX) packet Take 17 g by mouth daily.  . traMADol (ULTRAM) 50 MG tablet Take 1 tablet (50 mg total) by mouth every 6 (six) hours as needed.  . Turmeric 500 MG CAPS Take 500 mg by mouth  daily.     Allergies:   Niacin and related, Amlodipine, Cefdinir, Celecoxib, Codeine, Nebivolol hcl, Pregabalin, Cephalexin, Ciprofloxacin, Lisinopril, Sulfamethoxazole-trimethoprim, and Sulfonamide derivatives   Social History   Socioeconomic History  . Marital status: Widowed    Spouse name: Not on file  . Number of children: Not on file  . Years of education: Not on file  . Highest education level: Not on file  Occupational History  . Not on file  Tobacco Use  . Smoking status: Never Smoker  . Smokeless tobacco: Never Used  Substance and Sexual Activity  . Alcohol use: No  . Drug use: No  . Sexual activity: Not on file  Other Topics Concern  . Not on file  Social History Narrative  . Not on file   Social Determinants of Health   Financial Resource Strain:   . Difficulty of Paying Living Expenses: Not on file  Food Insecurity:   . Worried About Charity fundraiser in the Last Year: Not on file  . Ran Out of Food in the Last Year: Not on file  Transportation Needs:   . Lack of Transportation (Medical): Not on file  . Lack of Transportation (Non-Medical): Not on file  Physical Activity:   . Days of Exercise per Week: Not on file  . Minutes of Exercise per Session: Not on file  Stress:   . Feeling of Stress : Not on file  Social Connections:   . Frequency of Communication with Friends and Family: Not on file  . Frequency of Social Gatherings with Friends and Family: Not on file  . Attends Religious Services: Not on file  . Active Member of Clubs or Organizations: Not on file  . Attends Archivist Meetings: Not on file  . Marital Status: Not on file     Family History: The patient's family history includes Heart disease in her mother; Stroke in her mother.  ROS:   Please see the history of present illness.    ROS  All other systems reviewed and negative.   EKGs/Labs/Other Studies Reviewed:    The following studies were reviewed today: EKG and  labs  EKG:  EKG is not ordered today.    Recent Labs: 11/25/2018: BUN 17; Creatinine, Ser 1.32; Potassium 4.3; Sodium 141 03/29/2019: TSH 4.51   Recent Lipid Panel No results found for: CHOL, TRIG, HDL, CHOLHDL, VLDL, LDLCALC, LDLDIRECT  Physical Exam:    VS:  BP 124/62   Pulse 61   Ht 5\' 6"  (1.676 m)   Wt 128 lb (58.1 kg)   SpO2 97%   BMI 20.66 kg/m     Wt Readings from Last 3 Encounters:  05/25/19 128 lb (58.1 kg)  11/15/18 134 lb 6.4 oz (61 kg)  10/19/18 133 lb 6.4 oz (60.5 kg)     GEN:  Well nourished, well developed in no acute distress HEENT: Normal NECK: No JVD; No carotid bruits LYMPHATICS: No lymphadenopathy CARDIAC: RRR, no murmurs, rubs, gallops RESPIRATORY:  Clear to auscultation without rales, wheezing or rhonchi  ABDOMEN: Soft, non-tender, non-distended MUSCULOSKELETAL:  No edema; No deformity  SKIN: Warm and dry NEUROLOGIC:  Alert and oriented x 3 PSYCHIATRIC:  Normal affect   ASSESSMENT:    1. Persistent atrial fibrillation ([place])   2. Benign essential HTN   3. Nonrheumatic aortic valve insufficiency   4. Nonrheumatic mitral valve regurgitation   5. Bilateral leg edema   6. Precordial pain    PLAN:    In order of problems listed above:  1.  Persistent atrial fibrillation -remains in NSR on exam today -she is having a lot of recurrent palpitations -I will get a 2 week Ziopatch to see if palpitations are related to afib or other arrhythmia -continue Cardizem CD 120mg  daily for suppression of PAF and PRN short acting for breakthrough -denies bleeding on apixaban -continue apixaban 5mg  BID -check BMET and CBC  2.  Hypertension -BP controlled on exam today -continue Cardizem CD 120mg  daily   3.  Aortic insufficiency -mild by echo 09/2018  4. Mitral regurgitation -mild to moderate by echo 09/2018 -2D echo 09/2019 to followup on MR  5.  LE edema -this is chronic -continue diuretic -stressed < 2gm Na diet  6.  Large varicose  vein -this is on RLE and is painful -will refer to vascular and vein surgeons for recommendations  7.  Chest pain -this is exertional and starts in her arms and radiates into her chest -concerning for possible angina -I will get a Lexiscan myoview to rule out ischemia   Medication Adjustments/Labs and Tests Ordered: Current medicines are reviewed at length with the patient today.  Concerns regarding medicines are outlined above.  No orders of the defined types were placed in this encounter.  No orders of the defined types were placed in this encounter.   Signed, Fransico Him, MD  05/25/2019 9:31 AM    [place]

## 2019-05-25 ENCOUNTER — Encounter: Payer: Self-pay | Admitting: Cardiology

## 2019-05-25 ENCOUNTER — Other Ambulatory Visit: Payer: Self-pay

## 2019-05-25 ENCOUNTER — Ambulatory Visit: Payer: Medicare HMO | Admitting: Cardiology

## 2019-05-25 ENCOUNTER — Telehealth: Payer: Self-pay | Admitting: Radiology

## 2019-05-25 VITALS — BP 124/62 | HR 61 | Ht 66.0 in | Wt 128.0 lb

## 2019-05-25 DIAGNOSIS — I351 Nonrheumatic aortic (valve) insufficiency: Secondary | ICD-10-CM

## 2019-05-25 DIAGNOSIS — I10 Essential (primary) hypertension: Secondary | ICD-10-CM | POA: Diagnosis not present

## 2019-05-25 DIAGNOSIS — R072 Precordial pain: Secondary | ICD-10-CM | POA: Diagnosis not present

## 2019-05-25 DIAGNOSIS — I4819 Other persistent atrial fibrillation: Secondary | ICD-10-CM | POA: Diagnosis not present

## 2019-05-25 DIAGNOSIS — R600 Localized edema: Secondary | ICD-10-CM

## 2019-05-25 DIAGNOSIS — I8390 Asymptomatic varicose veins of unspecified lower extremity: Secondary | ICD-10-CM

## 2019-05-25 DIAGNOSIS — I340 Nonrheumatic mitral (valve) insufficiency: Secondary | ICD-10-CM | POA: Diagnosis not present

## 2019-05-25 NOTE — Patient Instructions (Addendum)
Medication Instructions:  Your physician recommends that you continue on your current medications as directed. Please refer to the Current Medication list given to you today.  *If you need a refill on your cardiac medications before your next appointment, please call your pharmacy*  Lab Work: TODAY: BMET and CBC If you have labs (blood work) drawn today and your tests are completely normal, you will receive your results only by: Marland Kitchen MyChart Message (if you have MyChart) OR . A paper copy in the mail If you have any lab test that is abnormal or we need to change your treatment, we will call you to review the results.  Testing/Procedures: Your physician has requested that you have an echocardiogram in June 2021. Echocardiography is a painless test that uses sound waves to create images of your heart. It provides your doctor with information about the size and shape of your heart and how well your heart's chambers and valves are working. This procedure takes approximately one hour. There are no restrictions for this procedure.  Your physician has requested that you have a lexiscan myoview. For further information please visit [email or web address]. Please follow instruction sheet, as given.  Your physician has recommended that you wear an event monitor. Event monitors are medical devices that record the heart's electrical activity. Doctors most often [place] these monitors to diagnose arrhythmias. Arrhythmias are problems with the speed or rhythm of the heartbeat. The monitor is a small, portable device. You can wear one while you do your normal daily activities. This is usually used to diagnose what is causing palpitations/syncope (passing out).   Follow-Up: At [facility], you and your health needs are our priority.  As part of our continuing mission to provide you with exceptional heart care, we have created designated Provider Care Teams.  These Care Teams include your primary Cardiologist (physician)  and Advanced Practice Providers (APPs -  Physician Assistants and Nurse Practitioners) who all work together to provide you with the care you need, when you need it.  Your next appointment:   6 month(s)  The format for your next appointment:   In Person  Provider:   You may see Fransico Him, MD or one of the following Advanced Practice Providers on your designated Care Team:    Melina Copa, PA-C  Ermalinda Barrios, PA-C  Other Instructions You have been referred to Vascular and Vein Specialist.

## 2019-05-25 NOTE — Telephone Encounter (Signed)
Enrolled patient for a 14 day Zio monitor to be mailed to patients home.  

## 2019-05-26 LAB — CBC
Hematocrit: 40.3 % (ref 34.0–46.6)
Hemoglobin: 13.8 g/dL (ref 11.1–15.9)
MCH: 30.1 pg (ref 26.6–33.0)
MCHC: 34.2 g/dL (ref 31.5–35.7)
MCV: 88 fL (ref 79–97)
Platelets: 92 10*3/uL — CL (ref 150–450)
RBC: 4.58 x10E6/uL (ref 3.77–5.28)
RDW: 12.8 % (ref 11.7–15.4)
WBC: 8.5 10*3/uL (ref 3.4–10.8)

## 2019-05-26 LAB — BASIC METABOLIC PANEL
BUN/Creatinine Ratio: 17 (ref 12–28)
BUN: 20 mg/dL (ref 8–27)
CO2: 26 mmol/L (ref 20–29)
Calcium: 10.3 mg/dL (ref 8.7–10.3)
Chloride: 97 mmol/L (ref 96–106)
Creatinine, Ser: 1.18 mg/dL — ABNORMAL HIGH (ref 0.57–1.00)
GFR calc Af Amer: 50 mL/min/{1.73_m2} — ABNORMAL LOW (ref >59)
GFR calc non Af Amer: 43 mL/min/{1.73_m2} — ABNORMAL LOW (ref >59)
Glucose: 86 mg/dL (ref 65–99)
Potassium: 3.9 mmol/L (ref 3.5–5.2)
Sodium: 138 mmol/L (ref 134–144)

## 2019-05-27 ENCOUNTER — Other Ambulatory Visit: Payer: Self-pay | Admitting: Internal Medicine

## 2019-05-27 ENCOUNTER — Ambulatory Visit (INDEPENDENT_AMBULATORY_CARE_PROVIDER_SITE_OTHER): Payer: Medicare HMO

## 2019-05-27 DIAGNOSIS — R600 Localized edema: Secondary | ICD-10-CM | POA: Diagnosis not present

## 2019-05-27 DIAGNOSIS — I351 Nonrheumatic aortic (valve) insufficiency: Secondary | ICD-10-CM

## 2019-05-27 DIAGNOSIS — I10 Essential (primary) hypertension: Secondary | ICD-10-CM

## 2019-05-27 DIAGNOSIS — E890 Postprocedural hypothyroidism: Secondary | ICD-10-CM

## 2019-05-27 DIAGNOSIS — I4819 Other persistent atrial fibrillation: Secondary | ICD-10-CM | POA: Diagnosis not present

## 2019-05-27 DIAGNOSIS — R072 Precordial pain: Secondary | ICD-10-CM | POA: Diagnosis not present

## 2019-05-27 DIAGNOSIS — I340 Nonrheumatic mitral (valve) insufficiency: Secondary | ICD-10-CM | POA: Diagnosis not present

## 2019-05-30 ENCOUNTER — Telehealth: Payer: Self-pay | Admitting: Cardiology

## 2019-05-30 NOTE — Telephone Encounter (Signed)
New Message:   Pt says she is scheduled for a Stress Test on 06-05-20. Her concern is, she is wearing a monitor and wants to know if she will be able to do the Stress Test with it on?

## 2019-05-31 ENCOUNTER — Telehealth (HOSPITAL_COMMUNITY): Payer: Self-pay | Admitting: *Deleted

## 2019-05-31 ENCOUNTER — Other Ambulatory Visit (INDEPENDENT_AMBULATORY_CARE_PROVIDER_SITE_OTHER): Payer: Medicare HMO

## 2019-05-31 ENCOUNTER — Other Ambulatory Visit: Payer: Self-pay

## 2019-05-31 DIAGNOSIS — E890 Postprocedural hypothyroidism: Secondary | ICD-10-CM

## 2019-05-31 LAB — TSH: TSH: 2.62 u[IU]/mL (ref 0.35–4.50)

## 2019-05-31 LAB — T4, FREE: Free T4: 1.26 ng/dL (ref 0.60–1.60)

## 2019-05-31 NOTE — Telephone Encounter (Signed)
Left message on voicemail per DPR in reference to upcoming appointment scheduled on 06/06/19 at 10:45 with detailed instructions given per Myocardial Perfusion Study Information Sheet for the test. LM to arrive 15 minutes early, and that it is imperative to arrive on time for appointment to keep from having the test rescheduled. If you need to cancel or reschedule your appointment, please call the office within 24 hours of your appointment. Failure to do so may result in a cancellation of your appointment, and a $50 no show fee. Phone number given for call back for any questions.

## 2019-06-01 ENCOUNTER — Ambulatory Visit: Payer: Medicare HMO | Attending: Internal Medicine

## 2019-06-01 ENCOUNTER — Encounter: Payer: Self-pay | Admitting: Internal Medicine

## 2019-06-01 DIAGNOSIS — Z23 Encounter for immunization: Secondary | ICD-10-CM

## 2019-06-01 NOTE — Telephone Encounter (Signed)
Left message for patient to call back.  She will need to reschedule lexiscan myoview.

## 2019-06-01 NOTE — Progress Notes (Signed)
   Covid-19 Vaccination Clinic  Name:  Samantha Henderson    MRN: [id number] DOB: [birth year]  06/01/2019  [name] was observed post Covid-19 immunization for 15 minutes without incidence. She was provided with Vaccine Information Sheet and instruction to access the V-Safe system.   [name] was instructed to call 911 with any severe reactions post vaccine: Marland Kitchen Difficulty breathing  . Swelling of your face and throat  . A fast heartbeat  . A bad rash all over your body  . Dizziness and weakness    Immunizations Administered    Name Date Dose VIS Date Route   Pfizer COVID-19 Vaccine 06/01/2019 10:22 AM 0.3 mL 04/01/2019 Intramuscular   Manufacturer: [place]   Lot: [id number]   [place]: [id number]

## 2019-06-02 NOTE — Telephone Encounter (Signed)
Follow up   Patient  Is returning your call per the previous message. Please call.

## 2019-06-06 ENCOUNTER — Other Ambulatory Visit: Payer: Self-pay

## 2019-06-06 ENCOUNTER — Ambulatory Visit (HOSPITAL_COMMUNITY): Payer: Medicare HMO | Attending: Cardiovascular Disease

## 2019-06-06 DIAGNOSIS — R072 Precordial pain: Secondary | ICD-10-CM | POA: Diagnosis not present

## 2019-06-06 LAB — MYOCARDIAL PERFUSION IMAGING
LV dias vol: 53 mL (ref 46–106)
LV sys vol: 18 mL
Peak HR: 68 {beats}/min
Rest HR: 57 {beats}/min
SDS: 2
SRS: 0
SSS: 2
TID: 1.28

## 2019-06-06 MED ORDER — TECHNETIUM TC 99M TETROFOSMIN IV KIT
31.9000 | PACK | Freq: Once | INTRAVENOUS | Status: AC | PRN
  Administered 2019-06-06: 31.9 via INTRAVENOUS
  Filled 2019-06-06: qty 32

## 2019-06-06 MED ORDER — TECHNETIUM TC 99M TETROFOSMIN IV KIT
10.2000 | PACK | Freq: Once | INTRAVENOUS | Status: AC | PRN
  Administered 2019-06-06: 10.2 via INTRAVENOUS
  Filled 2019-06-06: qty 11

## 2019-06-06 MED ORDER — REGADENOSON 0.4 MG/5ML IV SOLN
0.4000 mg | Freq: Once | INTRAVENOUS | Status: AC
  Administered 2019-06-06: 0.4 mg via INTRAVENOUS

## 2019-06-13 DIAGNOSIS — M069 Rheumatoid arthritis, unspecified: Secondary | ICD-10-CM | POA: Diagnosis not present

## 2019-06-13 DIAGNOSIS — E7800 Pure hypercholesterolemia, unspecified: Secondary | ICD-10-CM | POA: Diagnosis not present

## 2019-06-13 DIAGNOSIS — I480 Paroxysmal atrial fibrillation: Secondary | ICD-10-CM | POA: Diagnosis not present

## 2019-06-13 DIAGNOSIS — I10 Essential (primary) hypertension: Secondary | ICD-10-CM | POA: Diagnosis not present

## 2019-06-13 DIAGNOSIS — N183 Chronic kidney disease, stage 3 unspecified: Secondary | ICD-10-CM | POA: Diagnosis not present

## 2019-06-13 DIAGNOSIS — E039 Hypothyroidism, unspecified: Secondary | ICD-10-CM | POA: Diagnosis not present

## 2019-06-19 DIAGNOSIS — I4819 Other persistent atrial fibrillation: Secondary | ICD-10-CM | POA: Diagnosis not present

## 2019-06-22 ENCOUNTER — Telehealth: Payer: Self-pay

## 2019-06-22 DIAGNOSIS — I4819 Other persistent atrial fibrillation: Secondary | ICD-10-CM

## 2019-06-22 NOTE — Telephone Encounter (Signed)
-----   Message from Sueanne Margarita, MD sent at 06/22/2019  9:43 AM EST ----- Heart monitor shows increased episodes of afib and aflutter with RVR - please get into afib clinic in the next week

## 2019-06-27 ENCOUNTER — Other Ambulatory Visit: Payer: Self-pay

## 2019-06-27 ENCOUNTER — Ambulatory Visit (HOSPITAL_COMMUNITY)
Admission: RE | Admit: 2019-06-27 | Discharge: 2019-06-27 | Disposition: A | Discharge status: Home / Self Care | Payer: Medicare HMO | Source: Ambulatory Visit | Attending: Nurse Practitioner | Admitting: Nurse Practitioner

## 2019-06-27 VITALS — BP 140/70 | HR 50 | Ht 66.0 in | Wt 126.6 lb

## 2019-06-27 DIAGNOSIS — Z7952 Long term (current) use of systemic steroids: Secondary | ICD-10-CM | POA: Diagnosis not present

## 2019-06-27 DIAGNOSIS — Z888 Allergy status to other drugs, medicaments and biological substances status: Secondary | ICD-10-CM | POA: Diagnosis not present

## 2019-06-27 DIAGNOSIS — R519 Headache, unspecified: Secondary | ICD-10-CM | POA: Insufficient documentation

## 2019-06-27 DIAGNOSIS — Z90710 Acquired absence of both cervix and uterus: Secondary | ICD-10-CM | POA: Insufficient documentation

## 2019-06-27 DIAGNOSIS — Z8249 Family history of ischemic heart disease and other diseases of the circulatory system: Secondary | ICD-10-CM | POA: Insufficient documentation

## 2019-06-27 DIAGNOSIS — Z79899 Other long term (current) drug therapy: Secondary | ICD-10-CM | POA: Diagnosis not present

## 2019-06-27 DIAGNOSIS — Z881 Allergy status to other antibiotic agents status: Secondary | ICD-10-CM | POA: Insufficient documentation

## 2019-06-27 DIAGNOSIS — D6869 Other thrombophilia: Secondary | ICD-10-CM

## 2019-06-27 DIAGNOSIS — Z882 Allergy status to sulfonamides status: Secondary | ICD-10-CM | POA: Insufficient documentation

## 2019-06-27 DIAGNOSIS — E039 Hypothyroidism, unspecified: Secondary | ICD-10-CM | POA: Insufficient documentation

## 2019-06-27 DIAGNOSIS — Z79890 Hormone replacement therapy (postmenopausal): Secondary | ICD-10-CM | POA: Insufficient documentation

## 2019-06-27 DIAGNOSIS — Z7901 Long term (current) use of anticoagulants: Secondary | ICD-10-CM | POA: Insufficient documentation

## 2019-06-27 DIAGNOSIS — I10 Essential (primary) hypertension: Secondary | ICD-10-CM | POA: Insufficient documentation

## 2019-06-27 DIAGNOSIS — I272 Pulmonary hypertension, unspecified: Secondary | ICD-10-CM | POA: Insufficient documentation

## 2019-06-27 DIAGNOSIS — I480 Paroxysmal atrial fibrillation: Secondary | ICD-10-CM | POA: Diagnosis not present

## 2019-06-27 DIAGNOSIS — Z885 Allergy status to narcotic agent status: Secondary | ICD-10-CM | POA: Diagnosis not present

## 2019-06-27 DIAGNOSIS — Z823 Family history of stroke: Secondary | ICD-10-CM | POA: Diagnosis not present

## 2019-06-27 NOTE — Progress Notes (Signed)
Primary Care Physician: Leighton Ruff, MD Referring Physician: Dr. Alonza Bogus Samantha Henderson is a 85 y.o. female with a h/o paroxysmal afib x 5 years per pt. already on eliquis and  diltiazem 120 mg daily. She wore a ZIo patch for c/o palpitations and did show 4% atrial fibrillation burden  and some atrial runs varying between 5-13 secs. Longest episode of afib was 5 hours with a  V rate of 71 bpm.   She states that she does not feel that is is any worse that what she was accustomed to for the last 5 years. She denies  being presyncopal with these spells of elevated heart rate. No issues with eliquis with a CHA2DS2VASc score of at least 4.   She also feels that she is having more headaches over the last month that is new for her. No new meds. Do not correlate with her feeling an increased heart rate.   Today, she denies symptoms of palpitations, chest pain, shortness of breath, orthopnea, PND, lower extremity edema, dizziness, presyncope, syncope, or neurologic sequela. The patient is tolerating medications without difficulties and is otherwise without complaint today.   Past Medical History:  Diagnosis Date  . Abdominal bruit    Abdominal bruit in the past no aortic aneurysm by evaluation in the past  . Aortic regurgitation    Moderate by echo 05/2016  . Arm weakness    Arm weakness with exercise, January, 2012  . Benign essential HTN 01/27/2017  . Bradycardia   . Constipation    Constipation with diltiazem March, 2012  . Diastolic dysfunction    Mild  . Dizziness   . Ejection fraction 2011   55-60% ejection fraction, echo, January, 2012  . HTN (hypertension)   . Hypothyroidism   . Leg cramps    from lipitor in the past  . Mitral regurgitation    echo..04/2007  /  mild..04/2010  . Mitral regurgitation 11/15/2018   Mild to moderate by echo 09/2018  . PAC (premature atrial contraction)   . Palpitation   . Pericardial effusion    small...posterior...echo..04/2007  . Polymyalgia  ([place])   . Pulmonary hypertension ([place])   . Rheumatic fever    Questionable history of rheumatic fever, but no history of valvular abnormalities  . Sinus tachycardia   . Temporal arteritis ([facility])    Rule out temporal arteritis in the past  . Tricuspid regurgitation    -moderate...echo..04/2007...Marland KitchenMarland KitchenEF  60%..echo..04/2007  /   55-60%....echo..05/21/2010   Past Surgical History:  Procedure Laterality Date  . BACK SURGERY    . BUNIONECTOMY    . HIP PINNING,CANNULATED Left 04/12/2016   Procedure: CANNULATED HIP PINNING;  Surgeon: Marchia Bond, MD;  Location: [facility];  Service: Orthopedics;  Laterality: Left;  . NECK SURGERY    . TOTAL ABDOMINAL HYSTERECTOMY      Current Outpatient Medications  Medication Sig Dispense Refill  . Acetaminophen (TYLENOL PO) Take 650 mg by mouth as needed.    Marland Kitchen apixaban (ELIQUIS) 5 MG TABS tablet Take 1 tablet (5 mg total) by mouth 2 (two) times daily. 180 tablet 3  . atorvastatin (LIPITOR) 10 MG tablet Take by mouth daily.     . calcium carbonate (OS-CAL) 1250 (500 Ca) MG chewable tablet Chew by mouth. 1-3 chewables as needed    . Cholecalciferol (VITAMIN D3) 125 MCG (5000 UT) CAPS Take 1 capsule by mouth daily.     Marland Kitchen diltiazem (CARDIZEM CD) 120 MG 24 hr capsule Take 1 capsule (  120 mg total) by mouth daily. 90 capsule 3  . diltiazem (CARDIZEM) 30 MG tablet Take 1 tablet (30 mg total) by mouth daily as needed. For breakthrough atrial fibrillation. 90 tablet 3  . doxylamine, Sleep, (UNISOM) 25 MG tablet Take 25 mg by mouth at bedtime as needed.    . hydrochlorothiazide (HYDRODIURIL) 25 MG tablet Taking one tablet by mouth daily    . IBUPROFEN PO Take 650 mg by mouth every evening.     Marland Kitchen levothyroxine (SYNTHROID) 88 MCG tablet Take 1 tablet (88 mcg total) by mouth daily. 90 tablet 3  . Magnesium Oxide -Mg Supplement 250 MG TABS Take 1 tablet by mouth daily.    . Melatonin 10 MG TABS Take 10 mg by mouth at bedtime.    . polyethylene glycol (MIRALAX / GLYCOLAX) packet  Take 17 g by mouth daily.    . predniSONE (DELTASONE) 5 MG tablet Take by mouth daily.     . traMADol (ULTRAM) 50 MG tablet Take 1 tablet (50 mg total) by mouth every 6 (six) hours as needed. 30 tablet 0  . Turmeric 500 MG CAPS Take 500 mg by mouth daily.     No current facility-administered medications for this encounter.    Allergies  Allergen Reactions  . Niacin And Related Palpitations  . Amlodipine Swelling  . Cefdinir Swelling  . Celecoxib Rash  . Codeine Nausea And Vomiting  . Nebivolol Hcl Swelling  . Pregabalin Swelling  . Cephalexin     Rash  . Ciprofloxacin Swelling    Hives  Tongue Sweeling  . Lisinopril     Cougfh  . Plaquenil [Hydroxychloroquine] Other (See Comments)    Low platelets  . Sulfamethoxazole-Trimethoprim     Rash  . Sulfonamide Derivatives     Rash  . Augmentin [Amoxicillin-Pot Clavulanate] Rash    Social History   Socioeconomic History  . Marital status: Widowed    Spouse name: Not on file  . Number of children: Not on file  . Years of education: Not on file  . Highest education level: Not on file  Occupational History  . Not on file  Tobacco Use  . Smoking status: Never Smoker  . Smokeless tobacco: Never Used  Substance and Sexual Activity  . Alcohol use: No  . Drug use: No  . Sexual activity: Not on file  Other Topics Concern  . Not on file  Social History Narrative  . Not on file   Social Determinants of Health   Financial Resource Strain:   . Difficulty of Paying Living Expenses: Not on file  Food Insecurity:   . Worried About Charity fundraiser in the Last Year: Not on file  . Ran Out of Food in the Last Year: Not on file  Transportation Needs:   . Lack of Transportation (Medical): Not on file  . Lack of Transportation (Non-Medical): Not on file  Physical Activity:   . Days of Exercise per Week: Not on file  . Minutes of Exercise per Session: Not on file  Stress:   . Feeling of Stress : Not on file  Social  Connections:   . Frequency of Communication with Friends and Family: Not on file  . Frequency of Social Gatherings with Friends and Family: Not on file  . Attends Religious Services: Not on file  . Active Member of Clubs or Organizations: Not on file  . Attends Archivist Meetings: Not on file  . Marital Status: Not on file  Intimate Partner Violence:   . Fear of Current or Ex-Partner: Not on file  . Emotionally Abused: Not on file  . Physically Abused: Not on file  . Sexually Abused: Not on file    Family History  Problem Relation Age of Onset  . Heart disease Mother   . Stroke Mother     ROS- All systems are reviewed and negative except as per the HPI above  Physical Exam: Vitals:   06/27/19 1035  BP: 140/70  Pulse: (!) 50  Weight: 57.4 kg  Height: 5\' 6"  (1.676 m)   Wt Readings from Last 3 Encounters:  06/27/19 57.4 kg  06/06/19 58.1 kg  05/25/19 58.1 kg    Labs: Lab Results  Component Value Date   NA 138 05/25/2019   K 3.9 05/25/2019   CL 97 05/25/2019   CO2 26 05/25/2019   GLUCOSE 86 05/25/2019   BUN 20 05/25/2019   CREATININE 1.18 (H) 05/25/2019   CALCIUM 10.3 05/25/2019   Lab Results  Component Value Date   INR 1.23 04/12/2016   No results found for: CHOL, HDL, LDLCALC, TRIG   GEN- The patient is well appearing, alert and oriented x 3 today.   Head- normocephalic, atraumatic Eyes-  Sclera clear, conjunctiva pink Ears- hearing intact Oropharynx- clear Neck- supple, no JVP Lymph- no cervical lymphadenopathy Lungs- Clear to ausculation bilaterally, normal work of breathing Heart- Regular rate and rhythm, no murmurs, rubs or gallops, PMI not laterally displaced GI- soft, NT, ND, + BS Extremities- no clubbing, cyanosis, or edema MS- no significant deformity or atrophy Skin- no rash or lesion Psych- euthymic mood, full affect Neuro- strength and sensation are intact  EKG-sinus brady at 50 bpm, PR int 176 ms, qrs int 86 ms, qtc 397  ms  Zio patch -Patient had a min HR of 37 bpm, max HR of 193 bpm, and avg HR of 62 bpm. Predominant underlying rhythm was Sinus Rhythm. Slight P wave morphology changes were noted. 75 Supraventricular Tachycardia runs occurred, the run with the fastest interval lasting 5 beats with a max rate of 193 bpm, the longest lasting 13.6 secs with an avg rate of 117 bpm. Some episodes of Supraventricular Tachycardia may be possible Atrial Tachycardia with variable block. Atrial Fibrillation/Flutter occurred (4% burden), ranging from 46-179 bpm (avg of 80 bpm), the longest lasting 5 hours 3 mins with an avg rate of 71 bpm. Atrial Fibrillation/Flutter was detected within +/- 45 seconds of symptomatic patient event(s). Isolated SVEs were frequent (5.5%, [id number]), SVE Couplets were rare (<1.0%, 3412), and SVE Triplets were rare (<1.0%, 263). Isolated VEs were r   Assessment and Plan: 1. Paroxysmal afib Long standing PAF Overall her v rates are controlled and burden is low. Her SVT episodes are short lived(5-13 secs) She feels clear that this does not significantly represent a change in what she is accustomed to  I discussed antiarrythmic's with her but she feels that a change is  not needed at this time I cannot increase daily rate control for brady( asymptomatic ) at baseline She was informed at any time she felt afib burden was impacting her QOL, we could discuss antiarrythmic's again  2. CHA2DS2VASc of 4 Continue eliquis 5 mg bid   3. New onset of H/A's  Encouraged to discuss with PCP  afib clinic as needed  Geroge Baseman. Autumn Gunn, [facility] [address] [place], [place] [address] [phone]

## 2019-06-29 DIAGNOSIS — M154 Erosive (osteo)arthritis: Secondary | ICD-10-CM | POA: Diagnosis not present

## 2019-06-29 DIAGNOSIS — Z6820 Body mass index (BMI) 20.0-20.9, adult: Secondary | ICD-10-CM | POA: Diagnosis not present

## 2019-06-29 DIAGNOSIS — M1991 Primary osteoarthritis, unspecified site: Secondary | ICD-10-CM | POA: Diagnosis not present

## 2019-06-29 DIAGNOSIS — M329 Systemic lupus erythematosus, unspecified: Secondary | ICD-10-CM | POA: Diagnosis not present

## 2019-06-29 DIAGNOSIS — M2550 Pain in unspecified joint: Secondary | ICD-10-CM | POA: Diagnosis not present

## 2019-06-29 DIAGNOSIS — M0579 Rheumatoid arthritis with rheumatoid factor of multiple sites without organ or systems involvement: Secondary | ICD-10-CM | POA: Diagnosis not present

## 2019-07-04 ENCOUNTER — Telehealth: Payer: Self-pay | Admitting: Cardiology

## 2019-07-04 MED ORDER — APIXABAN 2.5 MG PO TABS: 2.5000 mg | ORAL_TABLET | Freq: Two times a day (BID) | ORAL | 1 refills | Status: DC

## 2019-07-04 NOTE — Telephone Encounter (Signed)
*  STAT* If patient is at the pharmacy, call can be transferred to refill team.   1. Which medications need to be refilled? (please list name of each medication and dose if known) apixaban (ELIQUIS) 5 MG TABS tablet  2. Which pharmacy/location (including street and city if local pharmacy) is medication to be sent to? [place], [place]  3. Do they need a 30 day or 90 day supply? 90   Patient calling the office for samples of medication:   1.  What medication and dosage are you requesting samples for? apixaban (ELIQUIS) 5 MG TABS tablet   2.  Are you currently out of this medication? yes

## 2019-07-04 NOTE — Telephone Encounter (Signed)
Patient currently weights 57.4kg 126lb. She states she lost some weight and is trying to keep it off, but thinks she will probably gain some weight this summer as she likes ice cream. No s/sx of bleeding. I will route to Dr. Radford Pax to see if she would like to leave at 5mg  BID or decrease to 2.5mg  BID. Patient is out and would like to use mail order. I will leave 2 weeks of samples downstairs for patient.

## 2019-07-04 NOTE — Telephone Encounter (Signed)
Agree with recommendations to decrease to 2.5mg  BID of Eliquis due to age > 59 and weight < 60kg  Traci

## 2019-07-04 NOTE — Addendum Note (Signed)
Addended by: Marcelle Overlie D on: 07/04/2019 03:19 PM   Modules accepted: Orders

## 2019-07-04 NOTE — Telephone Encounter (Addendum)
Last OV 06/27/19 Scr 1.18 on 05/25/19 82 57.4 kg Patient currently on 5mg  BID but her weight has dropped since October when it was last refilled and she is now <60kg.  Called patient to confrim that a weight of 126lb is accurate. Left message for her to call back.

## 2019-07-04 NOTE — Telephone Encounter (Signed)
Spoke with the patient about decreasing dose. She will pick up sample to tide her over until mail order comes. rx for 2.5mg  BID send to [facility] order

## 2019-07-04 NOTE — Telephone Encounter (Signed)
Pt needs a current Rx sent to pt's pharmacy. Thanks

## 2019-07-06 ENCOUNTER — Other Ambulatory Visit: Payer: Self-pay | Admitting: *Deleted

## 2019-07-06 MED ORDER — APIXABAN 2.5 MG PO TABS: 2.5000 mg | ORAL_TABLET | Freq: Two times a day (BID) | ORAL | 1 refills | Status: DC

## 2019-07-06 NOTE — Addendum Note (Signed)
Addended by: Johny Shock B on: 07/06/2019 09:26 AM   Modules accepted: Orders

## 2019-07-06 NOTE — Telephone Encounter (Signed)
Prescription refill request for Eliquis received.  Last office visit: 3/8/2021Kayleen Memos Scr: 1.18 05/25/2019 Age: 85 y.o. Weight: 57.4 kg   Prescription refill sent.

## 2019-07-06 NOTE — Addendum Note (Signed)
Addended by: Johny Shock B on: 07/06/2019 10:05 AM   Modules accepted: Orders

## 2019-07-06 NOTE — Telephone Encounter (Signed)
Prescription refill sent multiple times. Transmission failed. Called pt and verified prescription refill is to [facility]. Called and spoke to Seth Bake from [facility] who took a verbal order for Eliquis 2.5mg  BID.

## 2019-07-11 DIAGNOSIS — E039 Hypothyroidism, unspecified: Secondary | ICD-10-CM | POA: Diagnosis not present

## 2019-07-11 DIAGNOSIS — E7800 Pure hypercholesterolemia, unspecified: Secondary | ICD-10-CM | POA: Diagnosis not present

## 2019-07-11 DIAGNOSIS — N183 Chronic kidney disease, stage 3 unspecified: Secondary | ICD-10-CM | POA: Diagnosis not present

## 2019-07-11 DIAGNOSIS — M069 Rheumatoid arthritis, unspecified: Secondary | ICD-10-CM | POA: Diagnosis not present

## 2019-07-11 DIAGNOSIS — I480 Paroxysmal atrial fibrillation: Secondary | ICD-10-CM | POA: Diagnosis not present

## 2019-07-11 DIAGNOSIS — I10 Essential (primary) hypertension: Secondary | ICD-10-CM | POA: Diagnosis not present

## 2019-07-18 ENCOUNTER — Other Ambulatory Visit: Payer: Self-pay | Admitting: *Deleted

## 2019-07-18 DIAGNOSIS — M25562 Pain in left knee: Secondary | ICD-10-CM

## 2019-07-18 DIAGNOSIS — M25561 Pain in right knee: Secondary | ICD-10-CM

## 2019-07-19 ENCOUNTER — Ambulatory Visit (INDEPENDENT_AMBULATORY_CARE_PROVIDER_SITE_OTHER): Payer: Medicare HMO | Admitting: Vascular Surgery

## 2019-07-19 ENCOUNTER — Other Ambulatory Visit: Payer: Self-pay

## 2019-07-19 ENCOUNTER — Encounter: Payer: Self-pay | Admitting: Vascular Surgery

## 2019-07-19 ENCOUNTER — Ambulatory Visit (HOSPITAL_COMMUNITY)
Admission: RE | Admit: 2019-07-19 | Discharge: 2019-07-19 | Disposition: A | Discharge status: Home / Self Care | Payer: Medicare HMO | Source: Ambulatory Visit | Attending: Vascular Surgery | Admitting: Vascular Surgery

## 2019-07-19 DIAGNOSIS — M79661 Pain in right lower leg: Secondary | ICD-10-CM | POA: Diagnosis not present

## 2019-07-19 DIAGNOSIS — I8390 Asymptomatic varicose veins of unspecified lower extremity: Secondary | ICD-10-CM | POA: Diagnosis not present

## 2019-07-19 DIAGNOSIS — M79662 Pain in left lower leg: Secondary | ICD-10-CM

## 2019-07-19 DIAGNOSIS — M25561 Pain in right knee: Secondary | ICD-10-CM | POA: Diagnosis not present

## 2019-07-19 DIAGNOSIS — M25562 Pain in left knee: Secondary | ICD-10-CM | POA: Diagnosis not present

## 2019-07-19 DIAGNOSIS — R600 Localized edema: Secondary | ICD-10-CM | POA: Diagnosis not present

## 2019-07-19 NOTE — Progress Notes (Signed)
Patient name: Samantha Henderson MRN: [id number] DOB: Dec 22, 1934 Sex: female  REASON FOR CONSULT: Evaluate lower extremity varicose veins  HPI: Samantha Henderson is a 85 y.o. female, with history of A. fib on Eliquis, pulmonary hypertension, hypertension, moderate aortic regurgigation that presents for evaluation of lower extremity varicose veins.  Patient reports she has a large varicosity on her right anterior shin that has been very painful.  She states she is not currently wearing compression since that usually does not fit.  She states this large varicosity has been present for year.  She states she had a remote history of spider vein treatments.  She denies any laser ablations or strippings or other larger vein interventions.  No history of DVT no trauma.  Past Medical History:  Diagnosis Date  . Abdominal bruit    Abdominal bruit in the past no aortic aneurysm by evaluation in the past  . Aortic regurgitation    Moderate by echo 05/2016  . Arm weakness    Arm weakness with exercise, January, 2012  . Benign essential HTN 01/27/2017  . Bradycardia   . Constipation    Constipation with diltiazem March, 2012  . Diastolic dysfunction    Mild  . Dizziness   . Ejection fraction 2011   55-60% ejection fraction, echo, January, 2012  . HTN (hypertension)   . Hypothyroidism   . Leg cramps    from lipitor in the past  . Mitral regurgitation    echo..04/2007  /  mild..04/2010  . Mitral regurgitation 11/15/2018   Mild to moderate by echo 09/2018  . PAC (premature atrial contraction)   . Palpitation   . Pericardial effusion    small...posterior...echo..04/2007  . Polymyalgia ([place])   . Pulmonary hypertension ([place])   . Rheumatic fever    Questionable history of rheumatic fever, but no history of valvular abnormalities  . Sinus tachycardia   . Temporal arteritis ([facility])    Rule out temporal arteritis in the past  . Tricuspid regurgitation    -moderate...echo..04/2007...Marland KitchenMarland KitchenEF  60%..echo..04/2007  /    55-60%....echo..05/21/2010    Past Surgical History:  Procedure Laterality Date  . BACK SURGERY    . BUNIONECTOMY    . HIP PINNING,CANNULATED Left 04/12/2016   Procedure: CANNULATED HIP PINNING;  Surgeon: Marchia Bond, MD;  Location: [facility];  Service: Orthopedics;  Laterality: Left;  . NECK SURGERY    . TOTAL ABDOMINAL HYSTERECTOMY      Family History  Problem Relation Age of Onset  . Heart disease Mother   . Stroke Mother     SOCIAL HISTORY: Social History   Socioeconomic History  . Marital status: Widowed    Spouse name: Not on file  . Number of children: Not on file  . Years of education: Not on file  . Highest education level: Not on file  Occupational History  . Not on file  Tobacco Use  . Smoking status: Never Smoker  . Smokeless tobacco: Never Used  Substance and Sexual Activity  . Alcohol use: No  . Drug use: No  . Sexual activity: Not on file  Other Topics Concern  . Not on file  Social History Narrative  . Not on file   Social Determinants of Health   Financial Resource Strain:   . Difficulty of Paying Living Expenses:   Food Insecurity:   . Worried About Charity fundraiser in the Last Year:   . [place] in the Last Year:  Transportation Needs:   . Film/video editor (Medical):   Marland Kitchen Lack of Transportation (Non-Medical):   Physical Activity:   . Days of Exercise per Week:   . Minutes of Exercise per Session:   Stress:   . Feeling of Stress :   Social Connections:   . Frequency of Communication with Friends and Family:   . Frequency of Social Gatherings with Friends and Family:   . Attends Religious Services:   . Active Member of Clubs or Organizations:   . Attends Archivist Meetings:   Marland Kitchen Marital Status:   Intimate Partner Violence:   . Fear of Current or Ex-Partner:   . Emotionally Abused:   Marland Kitchen Physically Abused:   . Sexually Abused:     Allergies  Allergen Reactions  . Niacin And Related Palpitations  .  Amlodipine Swelling  . Cefdinir Swelling  . Celecoxib Rash  . Codeine Nausea And Vomiting  . Nebivolol Hcl Swelling  . Pregabalin Swelling  . Cephalexin     Rash  . Ciprofloxacin Swelling    Hives  Tongue Sweeling  . Lisinopril     Cougfh  . Plaquenil [Hydroxychloroquine] Other (See Comments)    Low platelets  . Sulfamethoxazole-Trimethoprim     Rash  . Sulfonamide Derivatives     Rash  . Augmentin [Amoxicillin-Pot Clavulanate] Rash    Current Outpatient Medications  Medication Sig Dispense Refill  . Acetaminophen (TYLENOL PO) Take 650 mg by mouth as needed.    Marland Kitchen apixaban (ELIQUIS) 2.5 MG TABS tablet Take 1 tablet (2.5 mg total) by mouth 2 (two) times daily. 180 tablet 1  . atorvastatin (LIPITOR) 10 MG tablet Take by mouth daily.     . calcium carbonate (OS-CAL) 1250 (500 Ca) MG chewable tablet Chew by mouth. 1-3 chewables as needed    . Cholecalciferol (VITAMIN D3) 125 MCG (5000 UT) CAPS Take 1 capsule by mouth daily.     Marland Kitchen diltiazem (CARDIZEM CD) 120 MG 24 hr capsule Take 1 capsule (120 mg total) by mouth daily. 90 capsule 3  . diltiazem (CARDIZEM) 30 MG tablet Take 1 tablet (30 mg total) by mouth daily as needed. For breakthrough atrial fibrillation. 90 tablet 3  . doxylamine, Sleep, (UNISOM) 25 MG tablet Take 25 mg by mouth at bedtime as needed.    . hydrochlorothiazide (HYDRODIURIL) 25 MG tablet Taking one tablet by mouth daily    . IBUPROFEN PO Take 650 mg by mouth every evening.     Marland Kitchen levothyroxine (SYNTHROID) 88 MCG tablet Take 1 tablet (88 mcg total) by mouth daily. 90 tablet 3  . Magnesium Oxide -Mg Supplement 250 MG TABS Take 1 tablet by mouth daily.    . Melatonin 10 MG TABS Take 10 mg by mouth at bedtime.    . polyethylene glycol (MIRALAX / GLYCOLAX) packet Take 17 g by mouth daily.    . predniSONE (DELTASONE) 5 MG tablet Take by mouth daily.     . traMADol (ULTRAM) 50 MG tablet Take 1 tablet (50 mg total) by mouth every 6 (six) hours as needed. 30 tablet 0  .  Turmeric 500 MG CAPS Take 500 mg by mouth daily.     No current facility-administered medications for this visit.    REVIEW OF SYSTEMS:  [X]  denotes positive finding, [ ]  denotes negative finding Cardiac  Comments:  Chest pain or chest pressure:    Shortness of breath upon exertion:    Short of breath when lying flat:  Irregular heart rhythm:        Vascular    Pain in calf, thigh, or hip brought on by ambulation:    Pain in feet at night that wakes you up from your sleep:     Blood clot in your veins:    Leg swelling:         Pulmonary    Oxygen at home:    Productive cough:     Wheezing:         Neurologic    Sudden weakness in arms or legs:     Sudden numbness in arms or legs:     Sudden onset of difficulty speaking or slurred speech:    Temporary loss of vision in one eye:     Problems with dizziness:         Gastrointestinal    Blood in stool:     Vomited blood:         Genitourinary    Burning when urinating:     Blood in urine:        Psychiatric    Major depression:         Hematologic    Bleeding problems:    Problems with blood clotting too easily:        Skin    Rashes or ulcers:        Constitutional    Fever or chills:      PHYSICAL EXAM: Vitals:   07/19/19 1503  BP: 137/74  Pulse: 63  Resp: 14  Temp: (!) 97.4 F (36.3 C)  TempSrc: Temporal  SpO2: 99%  Weight: 123 lb (55.8 kg)  Height: 5\' 6"  (1.676 m)    GENERAL: The patient is a well-nourished female, in no acute distress. The vital signs are documented above. CARDIAC: There is a regular rate and rhythm.  VASCULAR:  Palpable femoral pulses bilaterally Palpable PT pulses bilaterally Large varicosity on right anterior shin as pictured below Multiple small spider veins both legs No active ulceration PULMONARY: There is good air exchange bilaterally without wheezing or rales. ABDOMEN: Soft and non-tender MUSCULOSKELETAL: There are no major deformities or cyanosis. NEUROLOGIC:  No focal weakness or paresthesias are detected. SKIN: There are no ulcers or rashes noted. PSYCHIATRIC: The patient has a normal affect.      DATA:   Her lower extremity venous reflux study demonstrated no superficial or deep reflux in the right lower extremity and no right leg DVT.  Left leg reflux study shows deep reflux in common femoral vein as well as superficial reflux in the left small saphenous.  Assessment/Plan:  85 year old female who presents with large painful varicosity to the right anterior shin as pictured above.  Interestingly her lower extremity reflux study did not demonstrate any pathologic reflux in the right lower extremity but did demonstrate reflux in the left deep system as well as the left small saphenous.  In discussing best option moving forward we are going to recommend monitored medical compression with thigh-high 20 to 30 mmHg compression as well as leg elevation and have her follow-up in 3 months.  At that point time she should be reevaluated to see if she is a candidate for stab phlebectomy and I discussed with her some chance that her insurance may not approve given the fact that her reflux study did not demonstrate any significant reflux times in the right leg.   Marty Heck, MD [facility] Office: [phone]

## 2019-07-27 ENCOUNTER — Telehealth: Payer: Self-pay

## 2019-07-27 NOTE — Telephone Encounter (Signed)
Pt called with c/o compression stockings being too hard to get on. She has another pair at home she will wear. Wanted to return them. We discussed how this is not something we can do. Pt verbalized understanding; no further questions/concerns.

## 2019-08-04 DIAGNOSIS — R69 Illness, unspecified: Secondary | ICD-10-CM | POA: Diagnosis not present

## 2019-08-08 DIAGNOSIS — Z7901 Long term (current) use of anticoagulants: Secondary | ICD-10-CM | POA: Diagnosis not present

## 2019-08-08 DIAGNOSIS — E559 Vitamin D deficiency, unspecified: Secondary | ICD-10-CM | POA: Diagnosis not present

## 2019-08-08 DIAGNOSIS — E7800 Pure hypercholesterolemia, unspecified: Secondary | ICD-10-CM | POA: Diagnosis not present

## 2019-08-08 DIAGNOSIS — R69 Illness, unspecified: Secondary | ICD-10-CM | POA: Diagnosis not present

## 2019-08-08 DIAGNOSIS — Z0000 Encounter for general adult medical examination without abnormal findings: Secondary | ICD-10-CM | POA: Diagnosis not present

## 2019-08-08 DIAGNOSIS — K219 Gastro-esophageal reflux disease without esophagitis: Secondary | ICD-10-CM | POA: Diagnosis not present

## 2019-08-08 DIAGNOSIS — G479 Sleep disorder, unspecified: Secondary | ICD-10-CM | POA: Diagnosis not present

## 2019-08-08 DIAGNOSIS — N183 Chronic kidney disease, stage 3 unspecified: Secondary | ICD-10-CM | POA: Diagnosis not present

## 2019-08-08 DIAGNOSIS — I10 Essential (primary) hypertension: Secondary | ICD-10-CM | POA: Diagnosis not present

## 2019-08-08 DIAGNOSIS — E039 Hypothyroidism, unspecified: Secondary | ICD-10-CM | POA: Diagnosis not present

## 2019-08-12 DIAGNOSIS — R69 Illness, unspecified: Secondary | ICD-10-CM | POA: Diagnosis not present

## 2019-08-24 DIAGNOSIS — M170 Bilateral primary osteoarthritis of knee: Secondary | ICD-10-CM | POA: Diagnosis not present

## 2019-08-31 DIAGNOSIS — M170 Bilateral primary osteoarthritis of knee: Secondary | ICD-10-CM | POA: Diagnosis not present

## 2019-09-07 DIAGNOSIS — M170 Bilateral primary osteoarthritis of knee: Secondary | ICD-10-CM | POA: Diagnosis not present

## 2019-09-26 ENCOUNTER — Telehealth: Payer: Self-pay | Admitting: Cardiology

## 2019-09-26 ENCOUNTER — Ambulatory Visit (HOSPITAL_COMMUNITY): Payer: Medicare HMO | Admitting: Nurse Practitioner

## 2019-09-26 DIAGNOSIS — R42 Dizziness and giddiness: Secondary | ICD-10-CM | POA: Diagnosis not present

## 2019-09-26 DIAGNOSIS — H6590 Unspecified nonsuppurative otitis media, unspecified ear: Secondary | ICD-10-CM | POA: Diagnosis not present

## 2019-09-26 DIAGNOSIS — J302 Other seasonal allergic rhinitis: Secondary | ICD-10-CM | POA: Diagnosis not present

## 2019-09-26 DIAGNOSIS — R519 Headache, unspecified: Secondary | ICD-10-CM | POA: Diagnosis not present

## 2019-09-26 NOTE — Telephone Encounter (Signed)
30 day event monitor and carotid dopplers

## 2019-09-26 NOTE — Telephone Encounter (Signed)
New Message   Jan from Dr Drema Dallas office is calling  Dr Drema Dallas wanted to let know of unusual episode of vertigo this passed weekend. This may have been a TIA or be Cardiac related She is uncertain  Dr Drema Dallas is ordering an MRI of the head and an MRA of the neck and some Lab work  She is scheduled for Echo and Dr Drema Dallas is wondering if Dr Radford Pax would like to do any additional testing   Please advise

## 2019-09-26 NOTE — Telephone Encounter (Signed)
Advised patient of additional testing that Dr. Radford Pax would like for her to have done. I have scheduled her for carotid dopplers. Patient is hesitant to wear a heart monitor again (recently wore one in Feb 2021). She states that she does not believe that the episode was cardiac related. She states that she suddenly felt that the room was spinning and she broke out in a sweat. She was able to sit down but states that if the chair was not nearby she would have fallen. She states that symptoms quickly resolved spontaneously.

## 2019-09-29 ENCOUNTER — Other Ambulatory Visit (HOSPITAL_COMMUNITY): Payer: Self-pay | Admitting: Family Medicine

## 2019-09-29 ENCOUNTER — Other Ambulatory Visit: Payer: Self-pay | Admitting: Family Medicine

## 2019-09-30 ENCOUNTER — Other Ambulatory Visit: Payer: Self-pay | Admitting: Internal Medicine

## 2019-10-03 ENCOUNTER — Other Ambulatory Visit: Payer: Self-pay

## 2019-10-03 ENCOUNTER — Ambulatory Visit (HOSPITAL_COMMUNITY): Payer: Medicare HMO | Attending: Cardiology

## 2019-10-03 DIAGNOSIS — I10 Essential (primary) hypertension: Secondary | ICD-10-CM | POA: Insufficient documentation

## 2019-10-03 DIAGNOSIS — I4819 Other persistent atrial fibrillation: Secondary | ICD-10-CM | POA: Diagnosis not present

## 2019-10-03 DIAGNOSIS — R072 Precordial pain: Secondary | ICD-10-CM | POA: Insufficient documentation

## 2019-10-03 DIAGNOSIS — I340 Nonrheumatic mitral (valve) insufficiency: Secondary | ICD-10-CM | POA: Insufficient documentation

## 2019-10-03 DIAGNOSIS — R600 Localized edema: Secondary | ICD-10-CM

## 2019-10-03 DIAGNOSIS — I351 Nonrheumatic aortic (valve) insufficiency: Secondary | ICD-10-CM | POA: Insufficient documentation

## 2019-10-05 ENCOUNTER — Encounter: Payer: Self-pay | Admitting: Cardiology

## 2019-10-05 ENCOUNTER — Other Ambulatory Visit: Payer: Self-pay

## 2019-10-05 ENCOUNTER — Other Ambulatory Visit (HOSPITAL_COMMUNITY): Payer: Self-pay | Admitting: Family Medicine

## 2019-10-05 ENCOUNTER — Ambulatory Visit (HOSPITAL_COMMUNITY)
Admission: RE | Admit: 2019-10-05 | Discharge: 2019-10-05 | Disposition: A | Discharge status: Home / Self Care | Payer: Medicare HMO | Source: Ambulatory Visit | Attending: Cardiovascular Disease | Admitting: Cardiovascular Disease

## 2019-10-05 ENCOUNTER — Other Ambulatory Visit: Payer: Self-pay | Admitting: Family Medicine

## 2019-10-05 DIAGNOSIS — I6529 Occlusion and stenosis of unspecified carotid artery: Secondary | ICD-10-CM | POA: Insufficient documentation

## 2019-10-05 DIAGNOSIS — R519 Headache, unspecified: Secondary | ICD-10-CM

## 2019-10-05 DIAGNOSIS — R42 Dizziness and giddiness: Secondary | ICD-10-CM

## 2019-10-11 ENCOUNTER — Telehealth: Payer: Self-pay

## 2019-10-11 DIAGNOSIS — I6523 Occlusion and stenosis of bilateral carotid arteries: Secondary | ICD-10-CM

## 2019-10-11 DIAGNOSIS — K7689 Other specified diseases of liver: Secondary | ICD-10-CM

## 2019-10-11 NOTE — Telephone Encounter (Signed)
-----   Message from Nuala Alpha, LPN sent at 01/06/9216  4:53 PM EDT -----  ----- Message ----- From: Sueanne Margarita, MD Sent: 10/05/2019   4:50 PM EDT To: Leighton Ruff, MD, [facility] Triage  Carotid dopplers showed 1-39% bilateral carotid stenosis.  REpeat in 2 years

## 2019-10-11 NOTE — Telephone Encounter (Signed)
The patient has been notified of the result and verbalized understanding.  All questions (if any) were answered. Antonieta Iba, RN 10/11/2019 3:17 PM

## 2019-10-11 NOTE — Telephone Encounter (Signed)
-----   Message from Sueanne Margarita, MD sent at 10/03/2019 11:27 AM EDT ----- Echo showed  normal heart function with mildly thickened and stiff heart muscle, mildly enlarged LA and RA, mildly leaky MV and AV.  There is what appears to be a cyst in the liver.  Please order a Liver US to assess further

## 2019-10-12 ENCOUNTER — Telehealth: Payer: Self-pay | Admitting: Cardiology

## 2019-10-12 DIAGNOSIS — E878 Other disorders of electrolyte and fluid balance, not elsewhere classified: Secondary | ICD-10-CM | POA: Diagnosis not present

## 2019-10-12 NOTE — Telephone Encounter (Signed)
Would defer to her PCP

## 2019-10-12 NOTE — Telephone Encounter (Signed)
Left detailed message for pt to discuss this with PCP per Dr Radford Pax .Adonis Housekeeper

## 2019-10-12 NOTE — Telephone Encounter (Signed)
Pt called re last labs and kidney values Per pt years ago had kidney ultrasound and at that time revealed cysts on kidneys and has not been checked since Pt called wanting Dr Theodosia Blender input and if thought she should have kidneys checked at the same time as liver ultrasound Will forward to Dr Radford Pax for review./cy

## 2019-10-12 NOTE — Telephone Encounter (Signed)
New Message  Pt is calling and is requesting Dr Landis Gandy nurse to call her. She says she would like to talk about a liver ultrasound   Please call

## 2019-10-13 DIAGNOSIS — E871 Hypo-osmolality and hyponatremia: Secondary | ICD-10-CM | POA: Diagnosis not present

## 2019-10-14 ENCOUNTER — Other Ambulatory Visit: Payer: Self-pay

## 2019-10-14 ENCOUNTER — Other Ambulatory Visit: Payer: Self-pay | Admitting: Internal Medicine

## 2019-10-14 MED ORDER — LEVOTHYROXINE SODIUM 88 MCG PO TABS: 88.0000 ug | ORAL_TABLET | Freq: Every day | ORAL | 3 refills | Status: DC

## 2019-10-14 MED ORDER — LEVOTHYROXINE SODIUM 88 MCG PO TABS: 88.0000 ug | ORAL_TABLET | Freq: Every day | ORAL | 1 refills | Status: DC

## 2019-10-17 DIAGNOSIS — Z1231 Encounter for screening mammogram for malignant neoplasm of breast: Secondary | ICD-10-CM | POA: Diagnosis not present

## 2019-10-18 ENCOUNTER — Ambulatory Visit (HOSPITAL_COMMUNITY)
Admission: RE | Admit: 2019-10-18 | Discharge: 2019-10-18 | Disposition: A | Discharge status: Home / Self Care | Payer: Medicare HMO | Source: Ambulatory Visit | Attending: Family Medicine | Admitting: Family Medicine

## 2019-10-18 DIAGNOSIS — R519 Headache, unspecified: Secondary | ICD-10-CM

## 2019-10-18 DIAGNOSIS — Z8673 Personal history of transient ischemic attack (TIA), and cerebral infarction without residual deficits: Secondary | ICD-10-CM | POA: Insufficient documentation

## 2019-10-18 DIAGNOSIS — J341 Cyst and mucocele of nose and nasal sinus: Secondary | ICD-10-CM | POA: Insufficient documentation

## 2019-10-18 DIAGNOSIS — D320 Benign neoplasm of cerebral meninges: Secondary | ICD-10-CM | POA: Diagnosis not present

## 2019-10-18 DIAGNOSIS — R42 Dizziness and giddiness: Secondary | ICD-10-CM | POA: Diagnosis not present

## 2019-10-18 MED ORDER — GADOBUTROL 1 MMOL/ML IV SOLN
6.0000 mL | Freq: Once | INTRAVENOUS | Status: AC | PRN
  Administered 2019-10-18: 6 mL via INTRAVENOUS

## 2019-10-19 ENCOUNTER — Other Ambulatory Visit: Payer: Self-pay

## 2019-10-19 ENCOUNTER — Ambulatory Visit: Payer: Medicare HMO | Admitting: Internal Medicine

## 2019-10-19 ENCOUNTER — Other Ambulatory Visit: Payer: Self-pay | Admitting: Cardiology

## 2019-10-19 MED ORDER — DILTIAZEM HCL ER COATED BEADS 120 MG PO CP24: 120.0000 mg | ORAL_CAPSULE | Freq: Every day | ORAL | 2 refills | Status: DC

## 2019-10-20 ENCOUNTER — Ambulatory Visit: Payer: Medicare HMO | Admitting: Vascular Surgery

## 2019-10-20 ENCOUNTER — Ambulatory Visit: Payer: Medicare HMO | Admitting: Internal Medicine

## 2019-10-21 ENCOUNTER — Ambulatory Visit: Payer: Medicare HMO | Admitting: Internal Medicine

## 2019-10-25 ENCOUNTER — Ambulatory Visit (HOSPITAL_COMMUNITY)
Admission: RE | Admit: 2019-10-25 | Discharge: 2019-10-25 | Disposition: A | Discharge status: Home / Self Care | Payer: Medicare HMO | Source: Ambulatory Visit | Attending: Cardiology | Admitting: Cardiology

## 2019-10-25 ENCOUNTER — Telehealth: Payer: Self-pay | Admitting: Cardiology

## 2019-10-25 ENCOUNTER — Other Ambulatory Visit: Payer: Self-pay

## 2019-10-25 DIAGNOSIS — K7689 Other specified diseases of liver: Secondary | ICD-10-CM | POA: Insufficient documentation

## 2019-10-25 NOTE — Progress Notes (Signed)
Spoke with the ultrasound tech. Orders placed for liver doppler which will not show liver cysts. Orders have been changed to abdominal ultrasound.

## 2019-10-25 NOTE — Telephone Encounter (Signed)
Patient has order for liver doppler but the notes has for liver ultrasound.  She just wants to verify the order is correct, if not the order needs to be changed.  The patient is coming in today at 10am.

## 2019-10-25 NOTE — Telephone Encounter (Signed)
Spoke with the patient and let her know that orders were correct. Patient verbalized understanding.

## 2019-10-27 DIAGNOSIS — E871 Hypo-osmolality and hyponatremia: Secondary | ICD-10-CM | POA: Diagnosis not present

## 2019-11-01 ENCOUNTER — Telehealth: Payer: Self-pay

## 2019-11-01 DIAGNOSIS — K7689 Other specified diseases of liver: Secondary | ICD-10-CM

## 2019-11-01 NOTE — Telephone Encounter (Signed)
The patient has been notified of the result and verbalized understanding.  All questions (if any) were answered. MRI orders placed. Referral placed for GI Antonieta Iba, RN 11/01/2019 3:45 PM

## 2019-11-01 NOTE — Telephone Encounter (Signed)
-----   Message from Dorothy Spark, MD sent at 10/26/2019  1:41 PM EDT ----- Can you please order MRI of the abdomen and refer to GI?

## 2019-11-02 ENCOUNTER — Encounter: Payer: Self-pay | Admitting: Gastroenterology

## 2019-11-03 NOTE — Telephone Encounter (Signed)
Patient is calling to follow up with Dr. Theodosia Blender nurse. She states she has additional questions. Please call.

## 2019-11-03 NOTE — Telephone Encounter (Signed)
Spoke with pt and she states she spoke with someone in billing to make sure her insurance had been notified of the MRI order and they told her they had.  She wanted to know if we had heard back.  Advised pt if insurance denies coverage, we typically contact her to cancel the appt.  Pt appreciative for call.

## 2019-11-07 DIAGNOSIS — E7800 Pure hypercholesterolemia, unspecified: Secondary | ICD-10-CM | POA: Diagnosis not present

## 2019-11-07 DIAGNOSIS — N183 Chronic kidney disease, stage 3 unspecified: Secondary | ICD-10-CM | POA: Diagnosis not present

## 2019-11-07 DIAGNOSIS — I480 Paroxysmal atrial fibrillation: Secondary | ICD-10-CM | POA: Diagnosis not present

## 2019-11-07 DIAGNOSIS — M069 Rheumatoid arthritis, unspecified: Secondary | ICD-10-CM | POA: Diagnosis not present

## 2019-11-07 DIAGNOSIS — I10 Essential (primary) hypertension: Secondary | ICD-10-CM | POA: Diagnosis not present

## 2019-11-07 DIAGNOSIS — E039 Hypothyroidism, unspecified: Secondary | ICD-10-CM | POA: Diagnosis not present

## 2019-11-08 ENCOUNTER — Telehealth: Payer: Self-pay

## 2019-11-08 DIAGNOSIS — R69 Illness, unspecified: Secondary | ICD-10-CM | POA: Diagnosis not present

## 2019-11-08 NOTE — Telephone Encounter (Signed)
**Note De-Identified Ishmeal Rorie Obfuscation** Letter received from [facility] stating that they denied the pts Eliquis tier exception.  Reason: Eliquis is already at the lowest tier (Tier 3) possible for a name brand medication and cannot be lowered any further per Medicare Part D rules.  Unsure who started this tier exception but I have notified the pts pharmacy of this denial.

## 2019-11-09 ENCOUNTER — Other Ambulatory Visit: Payer: Self-pay | Admitting: *Deleted

## 2019-11-09 ENCOUNTER — Telehealth: Payer: Self-pay | Admitting: Cardiology

## 2019-11-09 MED ORDER — DILTIAZEM HCL ER COATED BEADS 120 MG PO CP24: 120.0000 mg | ORAL_CAPSULE | Freq: Every day | ORAL | 2 refills | Status: DC

## 2019-11-09 MED ORDER — DILTIAZEM HCL ER COATED BEADS 120 MG PO CP24: 120.0000 mg | ORAL_CAPSULE | Freq: Every day | ORAL | 1 refills | Status: DC

## 2019-11-09 MED ORDER — DILTIAZEM HCL ER COATED BEADS 120 MG PO CP24
120.0000 mg | ORAL_CAPSULE | Freq: Every day | ORAL | 1 refills | Status: DC
Start: 2019-11-09 — End: 2019-11-09

## 2019-11-09 NOTE — Addendum Note (Signed)
Addended by: Juventino Slovak on: 11/09/2019 12:46 PM   Modules accepted: Orders

## 2019-11-09 NOTE — Telephone Encounter (Signed)
*  STAT* If patient is at the pharmacy, call can be transferred to refill team.   1. Which medications need to be refilled? (please list name of each medication and dose if known) diltiazem (CARDIZEM CD) 120 MG 24 hr capsule  2. Which pharmacy/location (including street and city if local pharmacy) is medication to be sent to? [facility] [place] [place] [facility]  3. Do they need a 30 day or 90 day supply? 90 day PATIENT HAS ONE WEEK LEFT

## 2019-11-09 NOTE — Addendum Note (Signed)
Addended by: Juventino Slovak on: 11/09/2019 12:43 PM   Modules accepted: Orders

## 2019-11-09 NOTE — Addendum Note (Signed)
Addended by: Juventino Slovak on: 11/09/2019 12:45 PM   Modules accepted: Orders

## 2019-11-09 NOTE — Telephone Encounter (Addendum)
**Note De-Identified Mikiah Durall Obfuscation** Diltiazem 120 mg # 90 with 2 refills e-scribed to [facility]. The pt is aware and expressed appreciation for my call with update that her Diltiazem was sent to be filled per her request.

## 2019-11-10 MED ORDER — DILTIAZEM HCL ER COATED BEADS 120 MG PO CP24: 120.0000 mg | ORAL_CAPSULE | Freq: Every day | ORAL | 2 refills | Status: DC

## 2019-11-10 NOTE — Addendum Note (Signed)
Addended by: Carter Kitten D on: 11/10/2019 09:22 AM   Modules accepted: Orders

## 2019-11-10 NOTE — Telephone Encounter (Signed)
**Note De-Identified Anders Hohmann Obfuscation** I called [facility] and placed this refill over the phone with Leroy Sea R a pharmacist at [facility]. Per Leroy Sea the pt will receive her Diltiazem 120 mg # 90 with 2 refills in 5 to 10 business days.

## 2019-11-10 NOTE — Addendum Note (Signed)
**Note De-Identified Delmy Holdren Obfuscation** Addended by: Dennie Fetters on: 11/10/2019 09:15 AM   Modules accepted: Orders

## 2019-11-10 NOTE — Addendum Note (Signed)
**Note De-Identified Samantha Henderson Obfuscation** Addended by: Dennie Fetters on: 11/10/2019 08:59 AM   Modules accepted: Orders

## 2019-11-10 NOTE — Telephone Encounter (Signed)
Samantha Hough, LPN, something is wrong with [facility] receiving it, I think I has to be called into the pharmacy, because they are not receiving it electronically.

## 2019-11-14 ENCOUNTER — Other Ambulatory Visit: Payer: Self-pay

## 2019-11-14 ENCOUNTER — Ambulatory Visit (HOSPITAL_COMMUNITY)
Admission: RE | Admit: 2019-11-14 | Discharge: 2019-11-14 | Disposition: A | Discharge status: Home / Self Care | Payer: Medicare HMO | Source: Ambulatory Visit | Attending: Cardiology | Admitting: Cardiology

## 2019-11-14 DIAGNOSIS — K8689 Other specified diseases of pancreas: Secondary | ICD-10-CM | POA: Diagnosis not present

## 2019-11-14 DIAGNOSIS — N281 Cyst of kidney, acquired: Secondary | ICD-10-CM | POA: Diagnosis not present

## 2019-11-14 DIAGNOSIS — K7689 Other specified diseases of liver: Secondary | ICD-10-CM | POA: Diagnosis not present

## 2019-11-14 DIAGNOSIS — K862 Cyst of pancreas: Secondary | ICD-10-CM | POA: Diagnosis not present

## 2019-11-14 MED ORDER — GADOBUTROL 1 MMOL/ML IV SOLN
6.0000 mL | Freq: Once | INTRAVENOUS | Status: AC | PRN
  Administered 2019-11-14: 6 mL via INTRAVENOUS

## 2019-11-15 ENCOUNTER — Telehealth: Payer: Self-pay | Admitting: Cardiology

## 2019-11-15 NOTE — Telephone Encounter (Signed)
Diane with [facility] called to flag this part of the MRI the pt had done 11/14/19 for Dr. Radford Pax.   Will route to her for her review. Full report available in Epic.   "Enhancing lesion in the inferior endplate of the L1 vertebral body is darker than muscle on in phase gradient T1 weighted imaging and does not display definitive changes of in plate herniation in this location or of vertebral hemangioma. While this may represent an acute endplate process consider follow-up MRI of the spine for further evaluation particularly if there is any history of neoplasm."

## 2019-11-15 NOTE — Telephone Encounter (Signed)
Diane from [facility] calling to give report on MRI scan from 7/26. Transferred call to triage.

## 2019-11-16 NOTE — Telephone Encounter (Signed)
She has no history of cancer so please forward this to PCP to followup on and determine what imaging should be done next

## 2019-11-21 DIAGNOSIS — R935 Abnormal findings on diagnostic imaging of other abdominal regions, including retroperitoneum: Secondary | ICD-10-CM | POA: Diagnosis not present

## 2019-11-26 ENCOUNTER — Other Ambulatory Visit (HOSPITAL_COMMUNITY): Payer: Self-pay | Admitting: Family Medicine

## 2019-11-26 DIAGNOSIS — R935 Abnormal findings on diagnostic imaging of other abdominal regions, including retroperitoneum: Secondary | ICD-10-CM

## 2019-12-06 ENCOUNTER — Encounter (HOSPITAL_COMMUNITY): Payer: Self-pay

## 2019-12-06 ENCOUNTER — Ambulatory Visit (HOSPITAL_COMMUNITY): Payer: Medicare HMO

## 2019-12-12 NOTE — Progress Notes (Signed)
Cardiology Office Note:    Date:  12/13/2019   ID:  Samantha Henderson, DOB 04-Dec-1934, MRN [id number]  PCP:  Leighton Ruff, MD  Cardiologist:  Fransico Him, MD    Referring MD: Leighton Ruff, MD   Chief Complaint  Patient presents with  . Atrial Fibrillation  . Hypertension  . Aortic Insuffiency  . Mitral Regurgitation    History of Present Illness:    Samantha Henderson is a 85 y.o. female with a hx of persistent atrial fibrillation,moderate ARby 2D echocardiogram 06/10/2016, mild MR and HTN.When I last saw her she was complaining of episodes of arm pain with radiation into her chest with no associated symptoms.  She also was having palpitations.  She underwent nuclear stress test which showed no ischemia and 2D echo showed normal LVF with G2DD and mild MR and AR.  Event monitor showed recurrent PAF with RVR and she was referred to afib clinic. Due to bradycardia at baseline her BB could not be increased and since her PAF was not felt to be impacting QOL AADT was not started.    She is here today for followup and is doing well.  She denies any chest pain or pressure, SOB, DOE, PND, orthopnea, LE edema, dizziness or syncope. She occasionally has some palpitations but this is infrequent. She is compliant with her meds and is tolerating meds with no SE.    Past Medical History:  Diagnosis Date  . Abdominal bruit    Abdominal bruit in the past no aortic aneurysm by evaluation in the past  . Aortic regurgitation    Moderate by echo 05/2016  . Arm weakness    Arm weakness with exercise, January, 2012  . Benign essential HTN 01/27/2017  . Bradycardia   . Carotid stenosis    1-39% bilateral by dopplers 09/2019  . Constipation    Constipation with diltiazem March, 2012  . Diastolic dysfunction    Mild  . Dizziness   . Ejection fraction 2011   55-60% ejection fraction, echo, January, 2012  . HTN (hypertension)   . Hypothyroidism   . Leg cramps    from lipitor in the past  . Mitral  regurgitation    echo..04/2007  /  mild..04/2010  . Mitral regurgitation 11/15/2018   Mild to moderate by echo 09/2018  . PAC (premature atrial contraction)   . Palpitation   . Pericardial effusion    small...posterior...echo..04/2007  . Polymyalgia ([place])   . Pulmonary hypertension ([place])   . Rheumatic fever    Questionable history of rheumatic fever, but no history of valvular abnormalities  . Sinus tachycardia   . Temporal arteritis ([facility])    Rule out temporal arteritis in the past  . Tricuspid regurgitation    -moderate...echo..04/2007...Marland KitchenMarland KitchenEF  60%..echo..04/2007  /   55-60%....echo..05/21/2010    Past Surgical History:  Procedure Laterality Date  . BACK SURGERY    . BUNIONECTOMY    . HIP PINNING,CANNULATED Left 04/12/2016   Procedure: CANNULATED HIP PINNING;  Surgeon: Marchia Bond, MD;  Location: [place];  Service: Orthopedics;  Laterality: Left;  . NECK SURGERY    . TOTAL ABDOMINAL HYSTERECTOMY      Current Medications: Current Meds  Medication Sig  . Acetaminophen (TYLENOL PO) Take 650 mg by mouth as needed.  Marland Kitchen apixaban (ELIQUIS) 2.5 MG TABS tablet Take 1 tablet (2.5 mg total) by mouth 2 (two) times daily.  Marland Kitchen atorvastatin (LIPITOR) 10 MG tablet Take by mouth daily.   . calcium carbonate (OS-CAL)  1250 (500 Ca) MG chewable tablet Chew by mouth. 1-3 chewables as needed  . Cholecalciferol (VITAMIN D3) 125 MCG (5000 UT) CAPS Take 1 capsule by mouth daily.   Marland Kitchen diltiazem (CARDIZEM CD) 120 MG 24 hr capsule Take 1 capsule (120 mg total) by mouth daily.  Marland Kitchen diltiazem (CARDIZEM) 30 MG tablet Take 1 tablet (30 mg total) by mouth daily as needed. For breakthrough atrial fibrillation.  Marland Kitchen doxylamine, Sleep, (UNISOM) 25 MG tablet Take 25 mg by mouth at bedtime as needed.  . hydrochlorothiazide (HYDRODIURIL) 25 MG tablet Taking one tablet by mouth daily  . IBUPROFEN PO Take 650 mg by mouth every evening.   Marland Kitchen levothyroxine (SYNTHROID) 88 MCG tablet Take 1 tablet (88 mcg total) by mouth daily.  .  Magnesium Oxide -Mg Supplement 250 MG TABS Take 1 tablet by mouth daily.  . Melatonin 10 MG TABS Take 10 mg by mouth at bedtime.  . polyethylene glycol (MIRALAX / GLYCOLAX) packet Take 17 g by mouth daily.  . predniSONE (DELTASONE) 5 MG tablet Take by mouth daily.   . traMADol (ULTRAM) 50 MG tablet Take 1 tablet (50 mg total) by mouth every 6 (six) hours as needed.  . Turmeric 500 MG CAPS Take 500 mg by mouth daily.     Allergies:   Niacin and related, Amlodipine, Cefdinir, Celecoxib, Codeine, Nebivolol hcl, Pregabalin, Cephalexin, Ciprofloxacin, Lisinopril, Plaquenil [hydroxychloroquine], Sulfamethoxazole-trimethoprim, Sulfonamide derivatives, and Augmentin [amoxicillin-pot clavulanate]   Social History   Socioeconomic History  . Marital status: Widowed    Spouse name: Not on file  . Number of children: Not on file  . Years of education: Not on file  . Highest education level: Not on file  Occupational History  . Not on file  Tobacco Use  . Smoking status: Never Smoker  . Smokeless tobacco: Never Used  Vaping Use  . Vaping Use: Never used  Substance and Sexual Activity  . Alcohol use: No  . Drug use: No  . Sexual activity: Not on file  Other Topics Concern  . Not on file  Social History Narrative  . Not on file   Social Determinants of Health   Financial Resource Strain:   . Difficulty of Paying Living Expenses: Not on file  Food Insecurity:   . Worried About Charity fundraiser in the Last Year: Not on file  . Ran Out of Food in the Last Year: Not on file  Transportation Needs:   . Lack of Transportation (Medical): Not on file  . Lack of Transportation (Non-Medical): Not on file  Physical Activity:   . Days of Exercise per Week: Not on file  . Minutes of Exercise per Session: Not on file  Stress:   . Feeling of Stress : Not on file  Social Connections:   . Frequency of Communication with Friends and Family: Not on file  . Frequency of Social Gatherings with  Friends and Family: Not on file  . Attends Religious Services: Not on file  . Active Member of Clubs or Organizations: Not on file  . Attends Archivist Meetings: Not on file  . Marital Status: Not on file     Family History: The patient's family history includes Heart disease in her mother; Stroke in her mother.  ROS:   Please see the history of present illness.    ROS  All other systems reviewed and negative.   EKGs/Labs/Other Studies Reviewed:    The following studies were reviewed today:  2D echo  10/03/2019 IMPRESSIONS   1. Left ventricular ejection fraction, by estimation, is 60 to 65%. The  left ventricle has normal function. The left ventricle has no regional  wall motion abnormalities. There is mild concentric left ventricular  hypertrophy. Left ventricular diastolic  parameters are consistent with Grade II diastolic dysfunction  (pseudonormalization).  2. Right ventricular systolic function is normal. The right ventricular  size is normal. There is normal pulmonary artery systolic pressure. The  estimated right ventricular systolic pressure is 37.1 mmHg.  3. Left atrial size was mildly dilated.  4. Right atrial size was moderately dilated.  5. The mitral valve is normal in structure. Mild mitral valve  regurgitation. No evidence of mitral stenosis.  6. The aortic valve is tricuspid. Aortic valve regurgitation is mild.  Mild aortic valve sclerosis is present, with no evidence of aortic valve  stenosis.  7. The inferior vena cava is normal in size with greater than 50%  respiratory variability, suggesting right atrial pressure of 3 mmHg.  8. Cystic structure in the liver measuring 5.8 x 5.4cm. Consider  dedicated liver US.   EKG:  EKG is not ordered today.    Recent Labs: 05/25/2019: BUN 20; Creatinine, Ser 1.18; Hemoglobin 13.8; Platelets 92; Potassium 3.9; Sodium 138 05/31/2019: TSH 2.62   Recent Lipid Panel No results found for: CHOL, TRIG,  HDL, CHOLHDL, VLDL, LDLCALC, LDLDIRECT  Physical Exam:    VS:  BP 130/62   Pulse 65   Ht 5\' 6"  (1.676 m)   Wt 127 lb (57.6 kg)   SpO2 97%   BMI 20.50 kg/m     Wt Readings from Last 3 Encounters:  12/13/19 127 lb (57.6 kg)  07/19/19 123 lb (55.8 kg)  06/27/19 126 lb 9.6 oz (57.4 kg)     GEN: Well nourished, well developed in no acute distress HEENT: Normal NECK: No JVD; No carotid bruits LYMPHATICS: No lymphadenopathy CARDIAC:RRR, no murmurs, rubs, gallops RESPIRATORY:  Clear to auscultation without rales, wheezing or rhonchi  ABDOMEN: Soft, non-tender, non-distended MUSCULOSKELETAL:  No edema; No deformity  SKIN: Warm and dry NEUROLOGIC:  Alert and oriented x 3 PSYCHIATRIC:  Normal affect    ASSESSMENT:    1. Paroxysmal A-fib ([place])   2. Benign essential HTN   3. Nonrheumatic aortic valve insufficiency   4. Nonrheumatic mitral valve regurgitation   5. Bilateral leg edema    PLAN:    In order of problems listed above:  1.  PAF -she is maintaining NSR on exam -continue Cardizem CD 120mg  daily for suppression -she has not had much palpitations and it has not affecte her QOL -she has not had any significant bleeding issues on DOAC -continue Apixaban 2.5mg  BID given her age > 74 and Wt <60kg  2.  HTN -BP controlled on exam -continue Cardizem CD 120mg  daily and HCTZ 25mg  daily -SCr was 1..17 in July and Hbg 14.5 in June  3.  Aortic insufficiency -mild by echo 09/2018  4.  Mitral regurgitation -mild by echo 09/2018  5.  LE edema -chronic in nature but stable on diuretics -continue HCTZ 25mg  daily -SCr 1.17 in July 2021   Medication Adjustments/Labs and Tests Ordered: Current medicines are reviewed at length with the patient today.  Concerns regarding medicines are outlined above.  No orders of the defined types were placed in this encounter.  No orders of the defined types were placed in this encounter.   Signed, Fransico Him, MD  12/13/2019 1:33 PM     [facility]

## 2019-12-13 ENCOUNTER — Ambulatory Visit (HOSPITAL_COMMUNITY)
Admission: RE | Admit: 2019-12-13 | Discharge: 2019-12-13 | Disposition: A | Discharge status: Home / Self Care | Payer: Medicare HMO | Source: Ambulatory Visit | Attending: Family Medicine | Admitting: Family Medicine

## 2019-12-13 ENCOUNTER — Ambulatory Visit: Payer: Medicare HMO | Admitting: Cardiology

## 2019-12-13 ENCOUNTER — Encounter: Payer: Self-pay | Admitting: Cardiology

## 2019-12-13 ENCOUNTER — Other Ambulatory Visit: Payer: Self-pay

## 2019-12-13 VITALS — BP 130/62 | HR 65 | Ht 66.0 in | Wt 127.0 lb

## 2019-12-13 DIAGNOSIS — R935 Abnormal findings on diagnostic imaging of other abdominal regions, including retroperitoneum: Secondary | ICD-10-CM

## 2019-12-13 DIAGNOSIS — M5117 Intervertebral disc disorders with radiculopathy, lumbosacral region: Secondary | ICD-10-CM | POA: Diagnosis not present

## 2019-12-13 DIAGNOSIS — I351 Nonrheumatic aortic (valve) insufficiency: Secondary | ICD-10-CM | POA: Diagnosis not present

## 2019-12-13 DIAGNOSIS — G039 Meningitis, unspecified: Secondary | ICD-10-CM | POA: Diagnosis not present

## 2019-12-13 DIAGNOSIS — R600 Localized edema: Secondary | ICD-10-CM

## 2019-12-13 DIAGNOSIS — I480 Paroxysmal atrial fibrillation: Secondary | ICD-10-CM

## 2019-12-13 DIAGNOSIS — I340 Nonrheumatic mitral (valve) insufficiency: Secondary | ICD-10-CM | POA: Diagnosis not present

## 2019-12-13 DIAGNOSIS — M898X8 Other specified disorders of bone, other site: Secondary | ICD-10-CM | POA: Diagnosis not present

## 2019-12-13 DIAGNOSIS — I10 Essential (primary) hypertension: Secondary | ICD-10-CM | POA: Diagnosis not present

## 2019-12-13 DIAGNOSIS — M48061 Spinal stenosis, lumbar region without neurogenic claudication: Secondary | ICD-10-CM | POA: Diagnosis not present

## 2019-12-13 MED ORDER — GADOBUTROL 1 MMOL/ML IV SOLN
5.0000 mL | Freq: Once | INTRAVENOUS | Status: AC | PRN
  Administered 2019-12-13: 5 mL via INTRAVENOUS

## 2019-12-13 NOTE — Patient Instructions (Signed)
Medication Instructions:  Your physician recommends that you continue on your current medications as directed. Please refer to the Current Medication list given to you today.  *If you need a refill on your cardiac medications before your next appointment, please call your pharmacy*   Follow-Up: At [facility], you and your health needs are our priority.  As part of our continuing mission to provide you with exceptional heart care, we have created designated Provider Care Teams.  These Care Teams include your primary Cardiologist (physician) and Advanced Practice Providers (APPs -  Physician Assistants and Nurse Practitioners) who all work together to provide you with the care you need, when you need it.  We recommend signing up for the patient portal called "MyChart".  Sign up information is provided on this After Visit Summary.  MyChart is used to connect with patients for Virtual Visits (Telemedicine).  Patients are able to view lab/test results, encounter notes, upcoming appointments, etc.  Non-urgent messages can be sent to your provider as well.   To learn more about what you can do with MyChart, go to [email or web address].    Your next appointment:   1 year   The format for your next appointment:   In Person  Provider:   You may see Fransico Him, MD or one of the following Advanced Practice Providers on your designated Care Team:    Melina Copa, PA-C  Ermalinda Barrios, PA-C

## 2019-12-19 DIAGNOSIS — K7689 Other specified diseases of liver: Secondary | ICD-10-CM | POA: Diagnosis not present

## 2019-12-19 DIAGNOSIS — M519 Unspecified thoracic, thoracolumbar and lumbosacral intervertebral disc disorder: Secondary | ICD-10-CM | POA: Diagnosis not present

## 2019-12-19 DIAGNOSIS — Z23 Encounter for immunization: Secondary | ICD-10-CM | POA: Diagnosis not present

## 2019-12-20 DIAGNOSIS — S61213A Laceration without foreign body of left middle finger without damage to nail, initial encounter: Secondary | ICD-10-CM | POA: Diagnosis not present

## 2019-12-20 DIAGNOSIS — W268XXA Contact with other sharp object(s), not elsewhere classified, initial encounter: Secondary | ICD-10-CM | POA: Diagnosis not present

## 2019-12-29 ENCOUNTER — Encounter: Payer: Self-pay | Admitting: Gastroenterology

## 2019-12-29 ENCOUNTER — Ambulatory Visit: Payer: Medicare HMO | Admitting: Gastroenterology

## 2019-12-29 VITALS — BP 140/66 | HR 60 | Ht 66.0 in | Wt 129.6 lb

## 2019-12-29 DIAGNOSIS — R1011 Right upper quadrant pain: Secondary | ICD-10-CM | POA: Diagnosis not present

## 2019-12-29 DIAGNOSIS — K7689 Other specified diseases of liver: Secondary | ICD-10-CM

## 2019-12-29 DIAGNOSIS — K862 Cyst of pancreas: Secondary | ICD-10-CM | POA: Diagnosis not present

## 2019-12-29 NOTE — Progress Notes (Signed)
[facility] Consult Note:  History: Samantha Henderson 12/29/2019  Referring provider: Leighton Ruff, MD  Reason for consult/chief complaint: Hepatic Cyst (Some RUQ pain, did have some real sharp, no longer having sharp pain, Eliquis managed by Dr. Radford Pax, Last Colon  08/2016)   Subjective  HPI:  This is a pleasant 85 year old woman referred by primary care and her cardiologist for abnormal findings on GI imaging.Samantha Henderson was undergoing echocardiogram and hepatic cysts were seen.  This prompted an ultrasound with other findings and then a subsequent MRI.  All those reports are noted below. Samantha Henderson was very concerned about these findings, and wanted to know why she is developing cysts, why they are "spreading", and whether more would occur in the future.  She was also concerned that the kidney cysts might lead to kidney failure and dialysis.  She also concerns because she believes her father had pancreatic cancer.  Samantha Henderson has had some intermittent sharp right upper quadrant pain over the last few weeks nonradiating, not associated with eating position of bowel movements.  It usually lasts about 30 seconds.  She has not had fevers chills night sweats or weight loss.   ROS:  Review of Systems  Constitutional: Positive for fatigue. Negative for appetite change and unexpected weight change.  HENT: Negative for mouth sores and voice change.   Eyes: Negative for pain and redness.  Respiratory: Negative for cough and shortness of breath.   Cardiovascular: Negative for chest pain and palpitations.  Genitourinary: Negative for dysuria and hematuria.  Musculoskeletal: Positive for arthralgias. Negative for myalgias.  Skin: Negative for pallor and rash.  Neurological: Negative for weakness and headaches.  Hematological: Negative for adenopathy.     Past Medical History: Past Medical History:  Diagnosis Date  . Abdominal bruit    Abdominal bruit in the past no aortic aneurysm by  evaluation in the past  . Aortic regurgitation    Moderate by echo 05/2016  . Arm weakness    Arm weakness with exercise, January, 2012  . Benign essential HTN 01/27/2017  . Bradycardia   . Carotid stenosis    1-39% bilateral by dopplers 09/2019  . Constipation    Constipation with diltiazem March, 2012  . Diastolic dysfunction    Mild  . Dizziness   . Ejection fraction 2011   55-60% ejection fraction, echo, January, 2012  . HTN (hypertension)   . Hypothyroidism   . Leg cramps    from lipitor in the past  . Mitral regurgitation    echo..04/2007  /  mild..04/2010  . Mitral regurgitation 11/15/2018   Mild to moderate by echo 09/2018  . PAC (premature atrial contraction)   . Palpitation   . Pericardial effusion    small...posterior...echo..04/2007  . Polymyalgia ([place])   . Pulmonary hypertension ([place])   . Rheumatic fever    Questionable history of rheumatic fever, but no history of valvular abnormalities  . Sinus tachycardia   . Temporal arteritis (HCC)    Rule out temporal arteritis in the past  . Tricuspid regurgitation    -moderate...echo..04/2007...Marland KitchenMarland KitchenEF  60%..echo..04/2007  /   55-60%....echo..05/21/2010  . Varicose veins of both lower extremities      Past Surgical History: Past Surgical History:  Procedure Laterality Date  . BACK SURGERY    . BUNIONECTOMY    . HIP PINNING,CANNULATED Left 04/12/2016   Procedure: CANNULATED HIP PINNING;  Surgeon: Marchia Bond, MD;  Location: [facility];  Service: Orthopedics;  Laterality: Left;  . NECK SURGERY    .  TOTAL ABDOMINAL HYSTERECTOMY       Family History: Family History  Problem Relation Age of Onset  . Heart disease Mother   . Stroke Mother     Social History: Social History   Socioeconomic History  . Marital status: Widowed    Spouse name: Not on file  . Number of children: Not on file  . Years of education: Not on file  . Highest education level: Not on file  Occupational History  . Not on file  Tobacco Use  .  Smoking status: Never Smoker  . Smokeless tobacco: Never Used  Vaping Use  . Vaping Use: Never used  Substance and Sexual Activity  . Alcohol use: No  . Drug use: No  . Sexual activity: Not on file  Other Topics Concern  . Not on file  Social History Narrative  . Not on file   Social Determinants of Health   Financial Resource Strain:   . Difficulty of Paying Living Expenses: Not on file  Food Insecurity:   . Worried About Charity fundraiser in the Last Year: Not on file  . Ran Out of Food in the Last Year: Not on file  Transportation Needs:   . Lack of Transportation (Medical): Not on file  . Lack of Transportation (Non-Medical): Not on file  Physical Activity:   . Days of Exercise per Week: Not on file  . Minutes of Exercise per Session: Not on file  Stress:   . Feeling of Stress : Not on file  Social Connections:   . Frequency of Communication with Friends and Family: Not on file  . Frequency of Social Gatherings with Friends and Family: Not on file  . Attends Religious Services: Not on file  . Active Member of Clubs or Organizations: Not on file  . Attends Archivist Meetings: Not on file  . Marital Status: Not on file    Allergies: Allergies  Allergen Reactions  . Niacin And Related Palpitations  . Amlodipine Swelling  . Cefdinir Swelling  . Celecoxib Rash  . Codeine Nausea And Vomiting  . Nebivolol Hcl Swelling  . Pregabalin Swelling  . Cephalexin     Rash  . Ciprofloxacin Swelling    Hives  Tongue Sweeling  . Lisinopril     Cougfh  . Plaquenil [Hydroxychloroquine] Other (See Comments)    Low platelets  . Sulfamethoxazole-Trimethoprim     Rash  . Sulfonamide Derivatives     Rash  . Augmentin [Amoxicillin-Pot Clavulanate] Rash    Outpatient Meds: Current Outpatient Medications  Medication Sig Dispense Refill  . Acetaminophen (TYLENOL PO) Take 650 mg by mouth as needed.    Marland Kitchen apixaban (ELIQUIS) 2.5 MG TABS tablet Take 1 tablet (2.5 mg  total) by mouth 2 (two) times daily. 180 tablet 1  . atorvastatin (LIPITOR) 10 MG tablet Take by mouth daily.     . calcium carbonate (OS-CAL) 1250 (500 Ca) MG chewable tablet Chew by mouth. 1-3 chewables as needed    . Cholecalciferol (VITAMIN D3) 125 MCG (5000 UT) CAPS Take 1 capsule by mouth daily.     Marland Kitchen diltiazem (CARDIZEM CD) 120 MG 24 hr capsule Take 1 capsule (120 mg total) by mouth daily. 90 capsule 2  . diltiazem (CARDIZEM) 30 MG tablet Take 1 tablet (30 mg total) by mouth daily as needed. For breakthrough atrial fibrillation. 90 tablet 3  . doxylamine, Sleep, (UNISOM) 25 MG tablet Take 25 mg by mouth at bedtime as needed.    Marland Kitchen  hydrochlorothiazide (HYDRODIURIL) 25 MG tablet Taking one tablet by mouth daily    . IBUPROFEN PO Take 650 mg by mouth every evening.     Marland Kitchen levothyroxine (SYNTHROID) 88 MCG tablet Take 1 tablet (88 mcg total) by mouth daily. 90 tablet 1  . Magnesium Oxide -Mg Supplement 250 MG TABS Take 1 tablet by mouth daily.    . Melatonin 10 MG TABS Take 10 mg by mouth at bedtime.    . polyethylene glycol (MIRALAX / GLYCOLAX) packet Take 17 g by mouth daily.    . traMADol (ULTRAM) 50 MG tablet Take 1 tablet (50 mg total) by mouth every 6 (six) hours as needed. 30 tablet 0  . Turmeric 500 MG CAPS Take 500 mg by mouth daily.     No current facility-administered medications for this visit.      ___________________________________________________________________ Objective   Exam:  BP 140/66   Pulse 60   Ht 5\' 6"  (1.676 m)   Wt 129 lb 9.6 oz (58.8 kg)   BMI 20.92 kg/m    General: Well-appearing  Eyes: sclera anicteric, no redness  ENT: oral mucosa moist without lesions, no cervical or supraclavicular lymphadenopathy  CV: RRR without murmur, S1/S2, no JVD, no peripheral edema  Resp: clear to auscultation bilaterally, normal RR and effort noted  GI: soft, no tenderness, with active bowel sounds. No guarding or palpable organomegaly noted.  Skin; warm and  dry, no rash or jaundice noted  Neuro: awake, alert and oriented x 3. Normal gross motor function and fluent speech  Labs:  CMP Latest Ref Rng & Units 05/25/2019 11/25/2018 11/15/2018  Glucose 65 - 99 mg/dL 86 60(L) 106(H)  BUN 8 - 27 mg/dL 20 17 19   Creatinine 0.57 - 1.00 mg/dL 1.18(H) 1.32(H) 1.50(H)  Sodium 134 - 144 mmol/L 138 141 141  Potassium 3.5 - 5.2 mmol/L 3.9 4.3 4.0  Chloride 96 - 106 mmol/L 97 103 99  CO2 20 - 29 mmol/L 26 23 27   Calcium 8.7 - 10.3 mg/dL 10.3 9.0 9.4   LFTs from primary care on 09/26/2019 are normal  CBC Latest Ref Rng & Units 05/25/2019 02/16/2018 01/27/2017  WBC 3.4 - 10.8 x10E3/uL 8.5 9.1 6.6  Hemoglobin 11.1 - 15.9 g/dL 13.8 12.7 12.6  Hematocrit 34.0 - 46.6 % 40.3 38.3 37.4  Platelets 150 - 450 x10E3/uL 92(LL) CANCELED CANCELED   Low platelets since 2017   Radiologic Studies:   CLINICAL DATA:  Hepatic cyst seen on echocardiogram.   EXAM:  ULTRASOUND ABDOMEN COMPLETE   COMPARISON:  None.   FINDINGS:  Gallbladder:   No gallstones or wall thickening visualized. No sonographic Murphy  sign noted.   Common bile duct:   Diameter: 3.6 mm which is within normal limits.   Liver:   Multiple simple cysts are seen throughout the hepatic parenchyma.  The largest measures 6.6 cm in the medial portion of the right  hepatic lobe. 5.5 cm cyst is seen in left hepatic lobe. Otherwise  the liver is normal in echogenicity.   IVC:   No abnormality visualized.   Pancreas:   Visualized portion unremarkable.   Spleen:   Size and appearance within normal limits.   Right Kidney:   Length: 9.7 cm. Multiple simple cysts are noted. Echogenicity within  normal limits. No mass or hydronephrosis visualized.   Left Kidney:   Length: 9.3 cm. Two small simple cysts are noted. Echogenicity  within normal limits. No mass or hydronephrosis visualized.   Abdominal aorta:  No aneurysm visualized.   Other findings:   None.   IMPRESSION:  Multiple  simple hepatic cysts are noted, with the largest measuring  6.6 cm in the right hepatic lobe. Multiple simple renal cysts are  noted as well. No other abnormality seen in the abdomen.    Electronically Signed    By: Sabino Dick M.D.    On: 01/06/2014 10:59    __________________________________   CLINICAL DATA:  History of hepatic cyst   EXAM: ULTRASOUND ABDOMEN LIMITED RIGHT UPPER QUADRANT   COMPARISON:  Ultrasound 01/06/2014   FINDINGS: Gallbladder:   No shadowing stones. Possible small amount of gallbladder sludge. Normal wall thickness. Negative sonographic Murphy.   Common bile duct:   Diameter: 7 mm   Liver:   Multiple hepatic cysts are noted. Some of the cysts contain thin septa. The largest cyst in the left hepatic lobe measures 5.9 x 6.1 cm. The largest cysts in the right hepatic lobe measures 5.2 x 4.2 cm. Possible solid hyperechoic masses adjacent to the central hepatic veins measuring 1.8 x 1.9 x 1.9 cm and 1.6 x 1.4 x 1.5 cm. Portal vein is patent on color Doppler imaging with normal direction of blood flow towards the liver.   Other: Incidental note is made of cysts within the right kidney.   IMPRESSION: 1. Multiple hepatic cysts. 2. Possible solid masses adjacent to the hepatic veins. Further evaluation with MRI should be considered. 3. Possible small amount of gallbladder sludge. Borderline dilatation of common bile duct.     Electronically Signed   By: Donavan Foil M.D.   On: 10/25/2019 15:57 _____________________________________   CLINICAL DATA:  Hepatic cysts, possible solid masses seen on ultrasound   EXAM: MRI ABDOMEN WITHOUT AND WITH CONTRAST   TECHNIQUE: Multiplanar multisequence MR imaging of the abdomen was performed both before and after the administration of intravenous contrast.   CONTRAST:  95mL GADAVIST GADOBUTROL 1 MMOL/ML IV SOLN   COMPARISON:  Ultrasound evaluation of October 25, 2019   FINDINGS: Lower chest: No  consolidation or pleural effusion at the lung bases. Limited assessment of the lung bases on MRI.   Hepatobiliary: Numerous hepatic cysts. Largest in the LEFT hepatic lobe measuring 6 x 5.8 cm. Largest in the RIGHT hepatic lobe near the hepatic hilum measuring 5.4 x 4.8 cm. No significant biliary duct dilation due to mass effect of hepatic cysts. In hepatic subsegment IV a cyst displays hemorrhagic features but shows no other concerning characteristics   Pancreas: Small pancreatic cysts in the head of the pancreas largest in the uncinate measuring 1.1 by 0.7 cm. Another cystic lesion in the pancreatic head measuring approximately 1 x 0.6 cm. No visible, suspicious enhancement in these areas. No main duct dilation or peripancreatic stranding. Small cystic lesion in the tail of the pancreas measures 0.9 cm greatest dimension on image 22 of series 4   Spleen:  Normal in size and contour without focal lesion.   Adrenals/Urinary Tract:  Adrenal glands are normal.   Bilateral renal cysts without nephromegaly no hydronephrosis. No suspicious renal lesion.   Stomach/Bowel: Gastrointestinal tract is normal to the extent evaluated. Pelvic bowel loops are not imaged.   Vascular/Lymphatic: No aneurysmal dilation of the abdominal aorta. Mild atherosclerotic plaque.   Other:  No ascites.   Musculoskeletal: Enhancing lesion in the inferior endplate of the L1 vertebral body is darker than muscle on in phase gradient T1 weighted imaging and does not saturate on fat saturated images. No comparison imaging is  available. There are degenerative changes elsewhere in the spine. Coronal images do not display definitive changes of in plate herniation in this location or of vertebral hemangioma.   IMPRESSION: 1. Hepatic cysts some with hemorrhagic features, no focal hepatic mass. 2. Small cystic lesions in the pancreas, the largest measuring 1 x 0.6 cm. Two year follow-up with MRCP may be helpful  for further evaluation as clinically warranted in this patient. 3. Bilateral renal cysts without nephromegaly. 4. Enhancing lesion in the inferior endplate of the L1 vertebral body is darker than muscle on in phase gradient T1 weighted imaging and does not display definitive changes of in plate herniation in this location or of vertebral hemangioma. While this may represent an acute endplate process consider follow-up MRI of the spine for further evaluation particularly if there is any history of neoplasm. 5. These results will be called to the ordering clinician or representative by the Radiologist Assistant, and communication documented in the PACS or [employer].     Electronically Signed   By: Zetta Bills M.D.   On: 11/14/2019 16:11   Assessment: Encounter Diagnoses  Name Primary?  . Hepatic cyst Yes  . Pancreatic cyst   . RUQ pain     Recent intermittent sharp right upper quadrant pain of unclear cause, no associated digestive symptoms.  Query musculoskeletal.  We have a discussion about the hepatic and pancreatic cysts.  The 2 largest hepatic cysts are stable from 2015.  Some smaller ones have hemorrhagic component but still reportedly benign appearance of all.  Incidental small benign-appearing pancreatic cysts. I did my best to reassure her that the cystic lesions in various organs were reportedly benign by radiographic appearance.  It is unknown whether she may develop more.  Regarding the pancreatic cysts, unknown how long they have been there with no prior pancreatic imaging.  Reportedly benign appearance, radiologist recommended repeat MRI in 2 years. Given her reported family history and her concerns over these findings, we will plan a repeat MRI of the pancreas in 1 year.  Thank you for the courtesy of this consult.  Please call me with any questions or concerns.  Nelida Meuse III  CC: Referring provider noted above

## 2019-12-29 NOTE — Patient Instructions (Signed)
If you are age 83 or older, your body mass index should be between 23-30. Your Body mass index is 20.92 kg/m. If this is out of the aforementioned range listed, please consider follow up with your Primary Care Provider.  If you are age 72 or younger, your body mass index should be between 19-25. Your Body mass index is 20.92 kg/m. If this is out of the aformentioned range listed, please consider follow up with your Primary Care Provider.   You will be due for a repeat MRI in 12-2020. We will send you a reminder in the mail when it gets closer to that time.  It was a pleasure to see you today!  Dr. Loletha Carrow

## 2020-01-02 DIAGNOSIS — M1991 Primary osteoarthritis, unspecified site: Secondary | ICD-10-CM | POA: Diagnosis not present

## 2020-01-02 DIAGNOSIS — M154 Erosive (osteo)arthritis: Secondary | ICD-10-CM | POA: Diagnosis not present

## 2020-01-02 DIAGNOSIS — Z6820 Body mass index (BMI) 20.0-20.9, adult: Secondary | ICD-10-CM | POA: Diagnosis not present

## 2020-01-02 DIAGNOSIS — Z79899 Other long term (current) drug therapy: Secondary | ICD-10-CM | POA: Diagnosis not present

## 2020-01-02 DIAGNOSIS — M0579 Rheumatoid arthritis with rheumatoid factor of multiple sites without organ or systems involvement: Secondary | ICD-10-CM | POA: Diagnosis not present

## 2020-01-02 DIAGNOSIS — M545 Low back pain: Secondary | ICD-10-CM | POA: Diagnosis not present

## 2020-01-05 DIAGNOSIS — M069 Rheumatoid arthritis, unspecified: Secondary | ICD-10-CM | POA: Diagnosis not present

## 2020-01-05 DIAGNOSIS — I10 Essential (primary) hypertension: Secondary | ICD-10-CM | POA: Diagnosis not present

## 2020-01-05 DIAGNOSIS — I480 Paroxysmal atrial fibrillation: Secondary | ICD-10-CM | POA: Diagnosis not present

## 2020-01-05 DIAGNOSIS — N183 Chronic kidney disease, stage 3 unspecified: Secondary | ICD-10-CM | POA: Diagnosis not present

## 2020-01-05 DIAGNOSIS — E039 Hypothyroidism, unspecified: Secondary | ICD-10-CM | POA: Diagnosis not present

## 2020-01-05 DIAGNOSIS — E7800 Pure hypercholesterolemia, unspecified: Secondary | ICD-10-CM | POA: Diagnosis not present

## 2020-01-15 NOTE — Progress Notes (Signed)
Name: Samantha Henderson  MRN/ DOB: [id number], [birth year]    Age/ Sex: 85 y.o., female     PCP: Leighton Ruff, MD   Reason for Endocrinology Evaluation: Postoperative Hypothyroidism      Initial Endocrinology Clinic Visit: 02/22/2018    PATIENT IDENTIFIER: Samantha Henderson is a 85 y.o., female with a past medical history of HTN, temporal arteritis and A.Fib. She has followed with [facility] since 02/22/2018 for consultative assistance with management of her Hypothyroidism .   HISTORICAL SUMMARY: . She is S/P surgical hypothyroidism in 1961 due to [place]. She was always on Levothyroxine 112 mcg up until 2019, when her doses have been gradually reduced due to low TS. On her initial visit to our office she was on Levothyroxine 50 mcg daily with symptomatic hypothyroidism. She was on Biotin at the time. We have asked her to hold it for a few days with repeat TFT's consistent with hypothyroidism TSH 15.61 uIU/mL . We started her on Levothyroxine 100 mcg daily and she was asked to hold Biotin for at least 3 days prior to her lab work in the future.    SUBJECTIVE:    Today (01/16/2020):  Samantha Henderson is here for her a follow up on hypothyroidism. She has been taking LT-4 replacement consistently and as prescribed. She has been feeling much better on current dose of LT-4 replacement.    Weight has been increasing  She has chronic constipation , takes miralax regularly.  Denies depression or anxiety Denies sob or cough, no chest pain Denies local neck symptoms  No biotin    HISTORY:  Past Medical History:  Past Medical History:  Diagnosis Date  . Abdominal bruit    Abdominal bruit in the past no aortic aneurysm by evaluation in the past  . Aortic regurgitation    Moderate by echo 05/2016  . Arm weakness    Arm weakness with exercise, January, 2012  . Benign essential HTN 01/27/2017  . Bradycardia   . Carotid stenosis    1-39% bilateral by dopplers 09/2019  . Constipation     Constipation with diltiazem March, 2012  . Diastolic dysfunction    Mild  . Dizziness   . Ejection fraction 2011   55-60% ejection fraction, echo, January, 2012  . HTN (hypertension)   . Hypothyroidism   . Leg cramps    from lipitor in the past  . Mitral regurgitation    echo..04/2007  /  mild..04/2010  . Mitral regurgitation 11/15/2018   Mild to moderate by echo 09/2018  . PAC (premature atrial contraction)   . Palpitation   . Pericardial effusion    small...posterior...echo..04/2007  . Polymyalgia ([place])   . Pulmonary hypertension ([place])   . Rheumatic fever    Questionable history of rheumatic fever, but no history of valvular abnormalities  . Sinus tachycardia   . Temporal arteritis (HCC)    Rule out temporal arteritis in the past  . Tricuspid regurgitation    -moderate...echo..04/2007...Marland KitchenMarland KitchenEF  60%..echo..04/2007  /   55-60%....echo..05/21/2010  . Varicose veins of both lower extremities    Past Surgical History:  Past Surgical History:  Procedure Laterality Date  . BACK SURGERY    . BUNIONECTOMY    . HIP PINNING,CANNULATED Left 04/12/2016   Procedure: CANNULATED HIP PINNING;  Surgeon: Marchia Bond, MD;  Location: [facility];  Service: Orthopedics;  Laterality: Left;  . NECK SURGERY    . TOTAL ABDOMINAL HYSTERECTOMY      Social History:  reports that she has never smoked. She has never used smokeless tobacco. She reports that she does not drink alcohol and does not use drugs.  Family History: family history includes Heart disease in her mother; Stroke in her mother.   HOME MEDICATIONS: Levothyroxine 100 mcg daily     OBJECTIVE:   PHYSICAL EXAM: VS: BP 130/68 (BP Location: Left Arm, Patient Position: Sitting, Cuff Size: Small)   Pulse 75   Ht 5\' 6"  (1.676 m)   Wt 129 lb 9.6 oz (58.8 kg)   SpO2 96%   BMI 20.92 kg/m    EXAM: General: Pt appears well and is in NAD  Neck: General: Supple without adenopathy. Thyroid: No goiter or nodules appreciated.  Lungs: Clear with  good BS bilat with no rales, rhonchi, or wheezes  Heart: Auscultation: RRR.  Abdomen: Normoactive bowel sounds, soft, nontender, without masses or organomegaly palpable  Extremities:  BL LE: No pretibial edema normal ROM and strength.  Mental Status: Judgment, insight: Intact Orientation: Oriented to time, place, and person Mood and affect: No depression, anxiety, or agitation     DATA REVIEWED:  Results for BENA, KOBEL (MRN [id number]) as of 01/16/2020 14:23  Ref. Range 01/16/2020 09:24  TSH Latest Ref Range: 0.35 - 4.50 uIU/mL 3.26    ASSESSMENT / PLAN / RECOMMENDATIONS:   1. Post-Operative Hypothyroidism :  - She is clinically and biochemically euthyroid  - Repeat TSH is normal, will continue current dose of levothyroxine  Medications  Continue  Levothyroxine to 88 mcg daily     F/U in 1 yr   Signed electronically by: Mack Guise, MD  [facility]  [facility] [address] [place], [place] [address] Phone: [phone] FAX: [phone]      CC: Leighton Ruff, [place] [address] Phone: [phone]  Fax: [phone]   Return to Endocrinology clinic as below: Future Appointments  Date Time Provider [place]  01/16/2020  9:10 AM Tonnya Garbett, Melanie Crazier, MD [facility] None  02/28/2020  8:45 AM Hayden Pedro, MD [facility] None

## 2020-01-16 ENCOUNTER — Other Ambulatory Visit: Payer: Self-pay

## 2020-01-16 ENCOUNTER — Ambulatory Visit: Payer: Medicare HMO | Admitting: Internal Medicine

## 2020-01-16 ENCOUNTER — Encounter: Payer: Self-pay | Admitting: Internal Medicine

## 2020-01-16 VITALS — BP 130/68 | HR 75 | Ht 66.0 in | Wt 129.6 lb

## 2020-01-16 DIAGNOSIS — E890 Postprocedural hypothyroidism: Secondary | ICD-10-CM

## 2020-01-16 LAB — TSH: TSH: 3.26 u[IU]/mL (ref 0.35–4.50)

## 2020-01-16 MED ORDER — LEVOTHYROXINE SODIUM 88 MCG PO TABS: 88.0000 ug | ORAL_TABLET | Freq: Every day | ORAL | 3 refills | Status: DC

## 2020-01-16 NOTE — Patient Instructions (Signed)

## 2020-01-19 ENCOUNTER — Other Ambulatory Visit: Payer: Self-pay | Admitting: Cardiology

## 2020-01-19 MED ORDER — APIXABAN 2.5 MG PO TABS: 2.5000 mg | ORAL_TABLET | Freq: Two times a day (BID) | ORAL | 1 refills | Status: DC

## 2020-01-19 NOTE — Telephone Encounter (Signed)
Pt last saw Dr Radford Pax 12/13/19, last labs 10/27/19 Creat 1.17, age 85, weight 58.8kg, based on specified criteria pt is on appropriate dosage of Eliquis 2.5mg  BID. Will refill rx.

## 2020-01-19 NOTE — Telephone Encounter (Signed)
Transmission failed, so resent prescription. 85yrs old, wt-58.8kg, Crea-1.17 via scanned labs on 10/27/2019 from [facility], last seen by Dr. Radford Pax on 12/13/2019.

## 2020-01-19 NOTE — Addendum Note (Signed)
Addended by: Derrel Nip B on: 01/19/2020 12:48 PM   Modules accepted: Orders

## 2020-01-23 DIAGNOSIS — M5030 Other cervical disc degeneration, unspecified cervical region: Secondary | ICD-10-CM | POA: Diagnosis not present

## 2020-01-23 DIAGNOSIS — M5136 Other intervertebral disc degeneration, lumbar region: Secondary | ICD-10-CM | POA: Diagnosis not present

## 2020-01-23 DIAGNOSIS — M961 Postlaminectomy syndrome, not elsewhere classified: Secondary | ICD-10-CM | POA: Diagnosis not present

## 2020-02-28 ENCOUNTER — Encounter (INDEPENDENT_AMBULATORY_CARE_PROVIDER_SITE_OTHER): Payer: Medicare HMO | Admitting: Ophthalmology

## 2020-03-06 DIAGNOSIS — I480 Paroxysmal atrial fibrillation: Secondary | ICD-10-CM | POA: Diagnosis not present

## 2020-03-06 DIAGNOSIS — E7800 Pure hypercholesterolemia, unspecified: Secondary | ICD-10-CM | POA: Diagnosis not present

## 2020-03-06 DIAGNOSIS — E039 Hypothyroidism, unspecified: Secondary | ICD-10-CM | POA: Diagnosis not present

## 2020-03-06 DIAGNOSIS — K219 Gastro-esophageal reflux disease without esophagitis: Secondary | ICD-10-CM | POA: Diagnosis not present

## 2020-03-06 DIAGNOSIS — M069 Rheumatoid arthritis, unspecified: Secondary | ICD-10-CM | POA: Diagnosis not present

## 2020-03-06 DIAGNOSIS — I10 Essential (primary) hypertension: Secondary | ICD-10-CM | POA: Diagnosis not present

## 2020-03-06 DIAGNOSIS — N183 Chronic kidney disease, stage 3 unspecified: Secondary | ICD-10-CM | POA: Diagnosis not present

## 2020-03-27 ENCOUNTER — Other Ambulatory Visit: Payer: Self-pay

## 2020-03-27 ENCOUNTER — Encounter (INDEPENDENT_AMBULATORY_CARE_PROVIDER_SITE_OTHER): Payer: Medicare HMO | Admitting: Ophthalmology

## 2020-03-27 DIAGNOSIS — H35033 Hypertensive retinopathy, bilateral: Secondary | ICD-10-CM

## 2020-03-27 DIAGNOSIS — I10 Essential (primary) hypertension: Secondary | ICD-10-CM

## 2020-03-27 DIAGNOSIS — H43813 Vitreous degeneration, bilateral: Secondary | ICD-10-CM | POA: Diagnosis not present

## 2020-03-27 DIAGNOSIS — H33303 Unspecified retinal break, bilateral: Secondary | ICD-10-CM

## 2020-03-27 DIAGNOSIS — H26491 Other secondary cataract, right eye: Secondary | ICD-10-CM | POA: Diagnosis not present

## 2020-04-03 DIAGNOSIS — Z79899 Other long term (current) drug therapy: Secondary | ICD-10-CM | POA: Diagnosis not present

## 2020-04-03 DIAGNOSIS — M154 Erosive (osteo)arthritis: Secondary | ICD-10-CM | POA: Diagnosis not present

## 2020-04-03 DIAGNOSIS — M1991 Primary osteoarthritis, unspecified site: Secondary | ICD-10-CM | POA: Diagnosis not present

## 2020-04-03 DIAGNOSIS — M0579 Rheumatoid arthritis with rheumatoid factor of multiple sites without organ or systems involvement: Secondary | ICD-10-CM | POA: Diagnosis not present

## 2020-04-03 DIAGNOSIS — Z6821 Body mass index (BMI) 21.0-21.9, adult: Secondary | ICD-10-CM | POA: Diagnosis not present

## 2020-04-03 DIAGNOSIS — M2550 Pain in unspecified joint: Secondary | ICD-10-CM | POA: Diagnosis not present

## 2020-04-03 DIAGNOSIS — M545 Low back pain, unspecified: Secondary | ICD-10-CM | POA: Diagnosis not present

## 2020-04-24 ENCOUNTER — Encounter (INDEPENDENT_AMBULATORY_CARE_PROVIDER_SITE_OTHER): Payer: Medicare HMO | Admitting: Ophthalmology

## 2020-04-24 ENCOUNTER — Other Ambulatory Visit: Payer: Self-pay

## 2020-04-24 DIAGNOSIS — H2701 Aphakia, right eye: Secondary | ICD-10-CM

## 2020-04-26 DIAGNOSIS — M533 Sacrococcygeal disorders, not elsewhere classified: Secondary | ICD-10-CM | POA: Diagnosis not present

## 2020-05-02 DIAGNOSIS — M533 Sacrococcygeal disorders, not elsewhere classified: Secondary | ICD-10-CM | POA: Diagnosis not present

## 2020-05-13 DIAGNOSIS — M069 Rheumatoid arthritis, unspecified: Secondary | ICD-10-CM | POA: Diagnosis not present

## 2020-05-13 DIAGNOSIS — E7800 Pure hypercholesterolemia, unspecified: Secondary | ICD-10-CM | POA: Diagnosis not present

## 2020-05-13 DIAGNOSIS — I480 Paroxysmal atrial fibrillation: Secondary | ICD-10-CM | POA: Diagnosis not present

## 2020-05-13 DIAGNOSIS — E039 Hypothyroidism, unspecified: Secondary | ICD-10-CM | POA: Diagnosis not present

## 2020-05-13 DIAGNOSIS — I10 Essential (primary) hypertension: Secondary | ICD-10-CM | POA: Diagnosis not present

## 2020-05-13 DIAGNOSIS — K219 Gastro-esophageal reflux disease without esophagitis: Secondary | ICD-10-CM | POA: Diagnosis not present

## 2020-05-13 DIAGNOSIS — N183 Chronic kidney disease, stage 3 unspecified: Secondary | ICD-10-CM | POA: Diagnosis not present

## 2020-05-15 DIAGNOSIS — H401131 Primary open-angle glaucoma, bilateral, mild stage: Secondary | ICD-10-CM | POA: Diagnosis not present

## 2020-05-15 DIAGNOSIS — Z961 Presence of intraocular lens: Secondary | ICD-10-CM | POA: Diagnosis not present

## 2020-05-15 DIAGNOSIS — H0102A Squamous blepharitis right eye, upper and lower eyelids: Secondary | ICD-10-CM | POA: Diagnosis not present

## 2020-05-15 DIAGNOSIS — H33313 Horseshoe tear of retina without detachment, bilateral: Secondary | ICD-10-CM | POA: Diagnosis not present

## 2020-05-15 DIAGNOSIS — H524 Presbyopia: Secondary | ICD-10-CM | POA: Diagnosis not present

## 2020-05-16 DIAGNOSIS — M5459 Other low back pain: Secondary | ICD-10-CM | POA: Diagnosis not present

## 2020-06-25 DIAGNOSIS — I10 Essential (primary) hypertension: Secondary | ICD-10-CM | POA: Diagnosis not present

## 2020-06-25 DIAGNOSIS — K219 Gastro-esophageal reflux disease without esophagitis: Secondary | ICD-10-CM | POA: Diagnosis not present

## 2020-06-25 DIAGNOSIS — E039 Hypothyroidism, unspecified: Secondary | ICD-10-CM | POA: Diagnosis not present

## 2020-06-25 DIAGNOSIS — M069 Rheumatoid arthritis, unspecified: Secondary | ICD-10-CM | POA: Diagnosis not present

## 2020-06-25 DIAGNOSIS — I480 Paroxysmal atrial fibrillation: Secondary | ICD-10-CM | POA: Diagnosis not present

## 2020-06-25 DIAGNOSIS — E7800 Pure hypercholesterolemia, unspecified: Secondary | ICD-10-CM | POA: Diagnosis not present

## 2020-06-25 DIAGNOSIS — N183 Chronic kidney disease, stage 3 unspecified: Secondary | ICD-10-CM | POA: Diagnosis not present

## 2020-07-03 DIAGNOSIS — Z79899 Other long term (current) drug therapy: Secondary | ICD-10-CM | POA: Diagnosis not present

## 2020-07-03 DIAGNOSIS — M545 Low back pain, unspecified: Secondary | ICD-10-CM | POA: Diagnosis not present

## 2020-07-03 DIAGNOSIS — Z6820 Body mass index (BMI) 20.0-20.9, adult: Secondary | ICD-10-CM | POA: Diagnosis not present

## 2020-07-03 DIAGNOSIS — M1991 Primary osteoarthritis, unspecified site: Secondary | ICD-10-CM | POA: Diagnosis not present

## 2020-07-03 DIAGNOSIS — M0579 Rheumatoid arthritis with rheumatoid factor of multiple sites without organ or systems involvement: Secondary | ICD-10-CM | POA: Diagnosis not present

## 2020-07-03 DIAGNOSIS — M2550 Pain in unspecified joint: Secondary | ICD-10-CM | POA: Diagnosis not present

## 2020-07-03 DIAGNOSIS — M154 Erosive (osteo)arthritis: Secondary | ICD-10-CM | POA: Diagnosis not present

## 2020-07-23 ENCOUNTER — Other Ambulatory Visit: Payer: Self-pay | Admitting: Cardiology

## 2020-07-23 DIAGNOSIS — I480 Paroxysmal atrial fibrillation: Secondary | ICD-10-CM

## 2020-07-23 MED ORDER — APIXABAN 2.5 MG PO TABS: 2.5000 mg | ORAL_TABLET | Freq: Two times a day (BID) | ORAL | 1 refills | Status: DC

## 2020-07-23 MED ORDER — DILTIAZEM HCL ER COATED BEADS 120 MG PO CP24: 120.0000 mg | ORAL_CAPSULE | Freq: Every day | ORAL | 1 refills | Status: DC

## 2020-07-23 NOTE — Telephone Encounter (Signed)
Pt's medication was sent to pt's pharmacy as requested. Confirmation received.  °

## 2020-07-23 NOTE — Telephone Encounter (Signed)
*  STAT* If patient is at the pharmacy, call can be transferred to refill team.   1. Which medications need to be refilled? (please list name of each medication and dose if known)  apixaban (ELIQUIS) 2.5 MG TABS tablet diltiazem (CARDIZEM CD) 120 MG 24 hr capsule  2. Which pharmacy/location (including street and city if local pharmacy) is medication to be sent to? [facility] [place], [address] AT Portal to Registered [employer] Sites  3. Do they need a 30 day or 90 day supply?  90 day supply

## 2020-07-23 NOTE — Telephone Encounter (Signed)
Prescription refill request for Eliquis received. Indication: a fib Last office visit: 12/13/19 Scr: 1.18 Age: 86 Weight:  58kg

## 2020-08-13 DIAGNOSIS — Z0000 Encounter for general adult medical examination without abnormal findings: Secondary | ICD-10-CM | POA: Diagnosis not present

## 2020-08-13 DIAGNOSIS — K862 Cyst of pancreas: Secondary | ICD-10-CM | POA: Diagnosis not present

## 2020-08-13 DIAGNOSIS — I480 Paroxysmal atrial fibrillation: Secondary | ICD-10-CM | POA: Diagnosis not present

## 2020-08-13 DIAGNOSIS — Z7901 Long term (current) use of anticoagulants: Secondary | ICD-10-CM | POA: Diagnosis not present

## 2020-08-13 DIAGNOSIS — E890 Postprocedural hypothyroidism: Secondary | ICD-10-CM | POA: Diagnosis not present

## 2020-08-13 DIAGNOSIS — I10 Essential (primary) hypertension: Secondary | ICD-10-CM | POA: Diagnosis not present

## 2020-08-13 DIAGNOSIS — E7800 Pure hypercholesterolemia, unspecified: Secondary | ICD-10-CM | POA: Diagnosis not present

## 2020-08-13 DIAGNOSIS — M069 Rheumatoid arthritis, unspecified: Secondary | ICD-10-CM | POA: Diagnosis not present

## 2020-08-30 DIAGNOSIS — E890 Postprocedural hypothyroidism: Secondary | ICD-10-CM | POA: Diagnosis not present

## 2020-08-30 DIAGNOSIS — N183 Chronic kidney disease, stage 3 unspecified: Secondary | ICD-10-CM | POA: Diagnosis not present

## 2020-08-30 DIAGNOSIS — I480 Paroxysmal atrial fibrillation: Secondary | ICD-10-CM | POA: Diagnosis not present

## 2020-08-30 DIAGNOSIS — E7800 Pure hypercholesterolemia, unspecified: Secondary | ICD-10-CM | POA: Diagnosis not present

## 2020-08-30 DIAGNOSIS — I10 Essential (primary) hypertension: Secondary | ICD-10-CM | POA: Diagnosis not present

## 2020-08-30 DIAGNOSIS — K219 Gastro-esophageal reflux disease without esophagitis: Secondary | ICD-10-CM | POA: Diagnosis not present

## 2020-08-30 DIAGNOSIS — E039 Hypothyroidism, unspecified: Secondary | ICD-10-CM | POA: Diagnosis not present

## 2020-08-30 DIAGNOSIS — M069 Rheumatoid arthritis, unspecified: Secondary | ICD-10-CM | POA: Diagnosis not present

## 2020-09-24 ENCOUNTER — Telehealth: Payer: Self-pay | Admitting: Internal Medicine

## 2020-09-24 NOTE — Telephone Encounter (Signed)
Pt went to primary care app on 08/13/2020 and Primary Care Provider has contacted patient stating that her TSH is 7.69. Pt is wondering what to do about   levothyroxine (SYNTHROID) 88 MCG tablet  Pt is wondering if she needs to come in to be seen by Dr. Kelton Pillar to get blood work done or if it needs to be increased. Pt would like a call back from nurse

## 2020-09-24 NOTE — Telephone Encounter (Signed)
Message left for patient to return my call.  

## 2020-09-25 NOTE — Telephone Encounter (Signed)
Pt calling nurse back to follow up on last message

## 2020-09-26 NOTE — Telephone Encounter (Signed)
Spoken to patient and inform that Dr Kelton Pillar is out of the office until Monday 10/01/2020.  She stated that she wants to wait until Dr Kelton Pillar return and read her message.

## 2020-09-28 ENCOUNTER — Other Ambulatory Visit: Payer: Self-pay | Admitting: Internal Medicine

## 2020-09-28 DIAGNOSIS — E890 Postprocedural hypothyroidism: Secondary | ICD-10-CM

## 2020-09-28 NOTE — Telephone Encounter (Signed)
Message left for patient to return my call.  

## 2020-09-28 NOTE — Progress Notes (Signed)
TSH order entered.

## 2020-10-02 NOTE — Telephone Encounter (Signed)
I called pt states she has not missed and dosage at all or forgot. Pt scheduled for labs tomorrow.

## 2020-10-03 ENCOUNTER — Other Ambulatory Visit: Payer: Self-pay

## 2020-10-03 ENCOUNTER — Other Ambulatory Visit (INDEPENDENT_AMBULATORY_CARE_PROVIDER_SITE_OTHER): Payer: Medicare HMO

## 2020-10-03 DIAGNOSIS — M154 Erosive (osteo)arthritis: Secondary | ICD-10-CM | POA: Diagnosis not present

## 2020-10-03 DIAGNOSIS — Z6820 Body mass index (BMI) 20.0-20.9, adult: Secondary | ICD-10-CM | POA: Diagnosis not present

## 2020-10-03 DIAGNOSIS — Z79899 Other long term (current) drug therapy: Secondary | ICD-10-CM | POA: Diagnosis not present

## 2020-10-03 DIAGNOSIS — E890 Postprocedural hypothyroidism: Secondary | ICD-10-CM | POA: Diagnosis not present

## 2020-10-03 DIAGNOSIS — M1991 Primary osteoarthritis, unspecified site: Secondary | ICD-10-CM | POA: Diagnosis not present

## 2020-10-03 DIAGNOSIS — M0579 Rheumatoid arthritis with rheumatoid factor of multiple sites without organ or systems involvement: Secondary | ICD-10-CM | POA: Diagnosis not present

## 2020-10-03 DIAGNOSIS — M545 Low back pain, unspecified: Secondary | ICD-10-CM | POA: Diagnosis not present

## 2020-10-03 LAB — TSH: TSH: 1.78 u[IU]/mL (ref 0.35–4.50)

## 2020-10-04 ENCOUNTER — Ambulatory Visit (HOSPITAL_COMMUNITY)
Admission: RE | Admit: 2020-10-04 | Discharge: 2020-10-04 | Disposition: A | Discharge status: Home / Self Care | Payer: Medicare HMO | Source: Ambulatory Visit | Attending: Internal Medicine | Admitting: Internal Medicine

## 2020-10-04 ENCOUNTER — Encounter: Payer: Self-pay | Admitting: Internal Medicine

## 2020-10-04 DIAGNOSIS — I6523 Occlusion and stenosis of bilateral carotid arteries: Secondary | ICD-10-CM

## 2020-10-16 ENCOUNTER — Telehealth: Payer: Self-pay | Admitting: Gastroenterology

## 2020-10-16 DIAGNOSIS — K7689 Other specified diseases of liver: Secondary | ICD-10-CM

## 2020-10-16 DIAGNOSIS — K862 Cyst of pancreas: Secondary | ICD-10-CM

## 2020-10-16 NOTE — Telephone Encounter (Signed)
Spoke with patient, she wanted to schedule repeat MRI in September. Advised that I have placed the order and radiology scheduling will contact her directly to schedule her appt. Patient has been scheduled for a 1-year follow up with Dr. Loletha Carrow on Tuesday, 01/01/21 at 9:20 am. Patient verbalized understanding and had no concerns at the end of the call.  MRI order in epic. Staff message sent to radiology schedulers, April P. and Karma Greaser.

## 2020-10-16 NOTE — Telephone Encounter (Signed)
Inbound call from patient. Wants to talk about MRI scheduling before making followup appointment in September. Best contact number [phone]

## 2020-10-17 DIAGNOSIS — Z1231 Encounter for screening mammogram for malignant neoplasm of breast: Secondary | ICD-10-CM | POA: Diagnosis not present

## 2020-10-17 DIAGNOSIS — Z803 Family history of malignant neoplasm of breast: Secondary | ICD-10-CM | POA: Diagnosis not present

## 2020-11-12 DIAGNOSIS — M069 Rheumatoid arthritis, unspecified: Secondary | ICD-10-CM | POA: Diagnosis not present

## 2020-11-12 DIAGNOSIS — E039 Hypothyroidism, unspecified: Secondary | ICD-10-CM | POA: Diagnosis not present

## 2020-11-12 DIAGNOSIS — I10 Essential (primary) hypertension: Secondary | ICD-10-CM | POA: Diagnosis not present

## 2020-11-12 DIAGNOSIS — E890 Postprocedural hypothyroidism: Secondary | ICD-10-CM | POA: Diagnosis not present

## 2020-11-12 DIAGNOSIS — I480 Paroxysmal atrial fibrillation: Secondary | ICD-10-CM | POA: Diagnosis not present

## 2020-11-12 DIAGNOSIS — K219 Gastro-esophageal reflux disease without esophagitis: Secondary | ICD-10-CM | POA: Diagnosis not present

## 2020-11-12 DIAGNOSIS — N183 Chronic kidney disease, stage 3 unspecified: Secondary | ICD-10-CM | POA: Diagnosis not present

## 2020-11-12 DIAGNOSIS — E7800 Pure hypercholesterolemia, unspecified: Secondary | ICD-10-CM | POA: Diagnosis not present

## 2020-11-19 DIAGNOSIS — H0102A Squamous blepharitis right eye, upper and lower eyelids: Secondary | ICD-10-CM | POA: Diagnosis not present

## 2020-11-19 DIAGNOSIS — H0102B Squamous blepharitis left eye, upper and lower eyelids: Secondary | ICD-10-CM | POA: Diagnosis not present

## 2020-11-19 DIAGNOSIS — H401131 Primary open-angle glaucoma, bilateral, mild stage: Secondary | ICD-10-CM | POA: Diagnosis not present

## 2020-12-05 DIAGNOSIS — R413 Other amnesia: Secondary | ICD-10-CM | POA: Diagnosis not present

## 2020-12-05 DIAGNOSIS — R42 Dizziness and giddiness: Secondary | ICD-10-CM | POA: Diagnosis not present

## 2020-12-16 IMAGING — MR MR LUMBAR SPINE WO/W CM
7 of 8 series · 26 of 48 positions shown · IV contrast (gadavist)
Comparison: MRI abdomen with contrast 11/14/2019

CLINICAL DATA: Follow-up abnormal MRI abdomen. Follow-up enhancing
lesion inferior endplate of L1.

EXAM:
MRI LUMBAR SPINE WITHOUT AND WITH CONTRAST
TECHNIQUE: Multiplanar and multiecho pulse sequences of the lumbar spine were
obtained without and with intravenous contrast.
CONTRAST:  5mL GADAVIST GADOBUTROL 1 MMOL/ML IV SOLN

[Series 5: T1 · sagittal · 4.0mm · 0.81mm/px · 3 of 17 slices shown (1 of 2)]
[im 1/17]
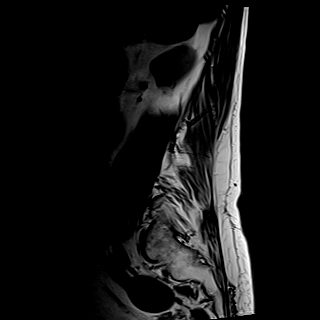
[im 9/17]
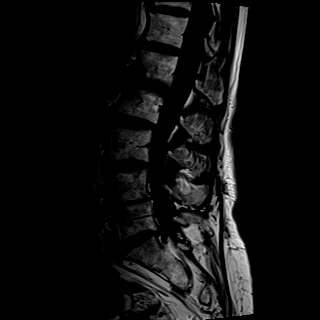
[im 17/17]
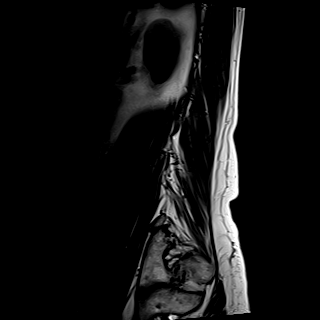

[Series 6: STIR · sagittal · 4.0mm · 0.51mm/px · 3 of 17 slices shown]
[im 1/17]
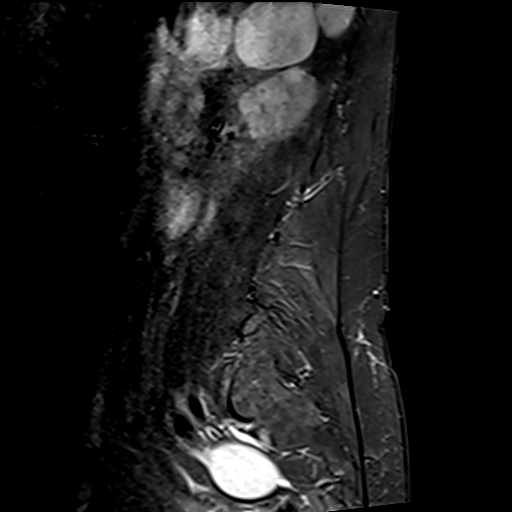
[im 9/17]
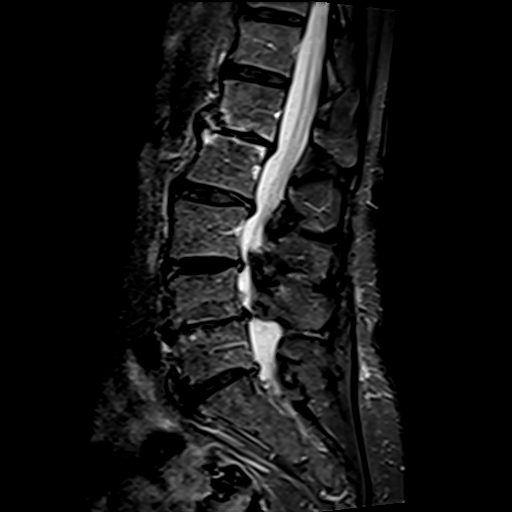
[im 17/17]
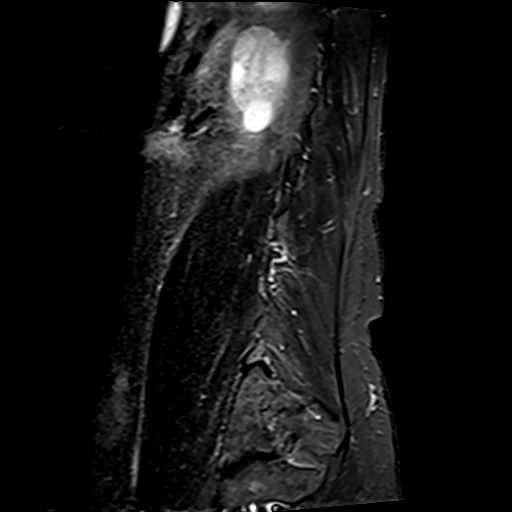

[Series 7: T1 · axial · 4.0mm · 0.39mm/px · z∈[-85,+132]mm · 6 of 43 slices shown (2 of 2)]
[im 1/43]
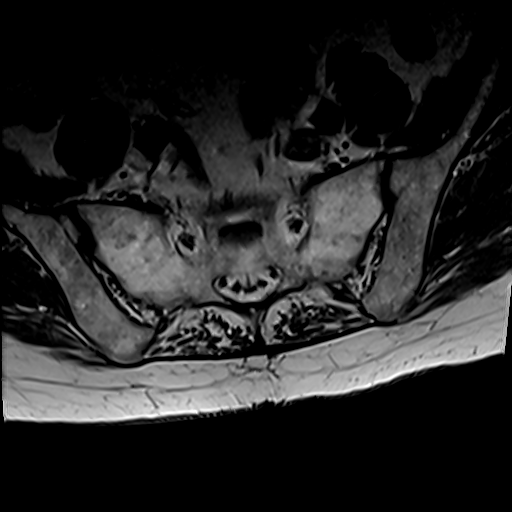
[im 9/43]
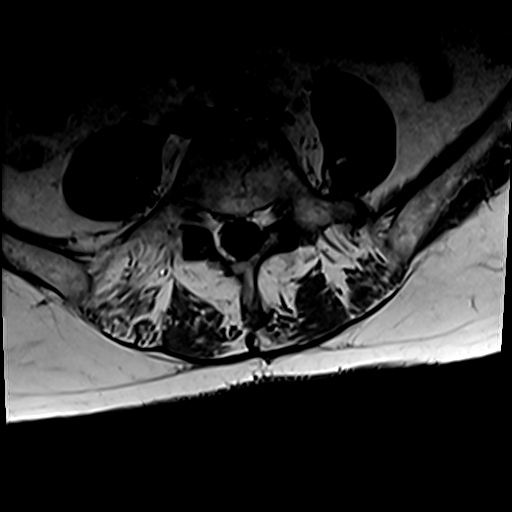
[im 17/43]
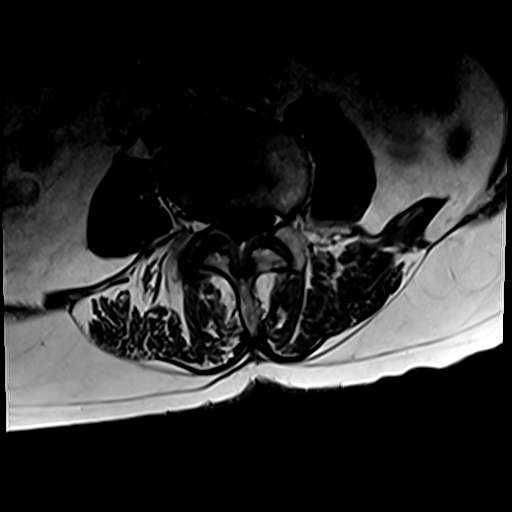
[im 26/43]
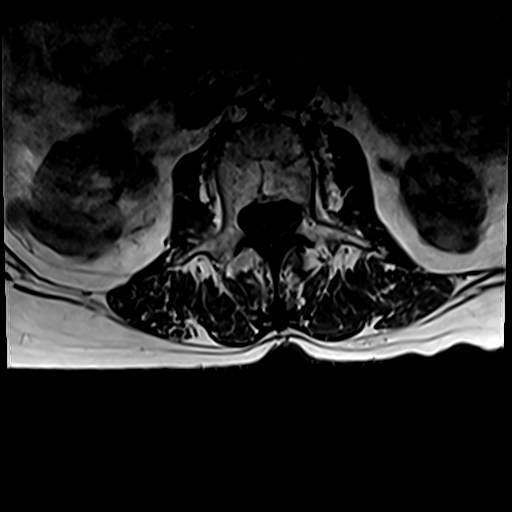
[im 34/43]
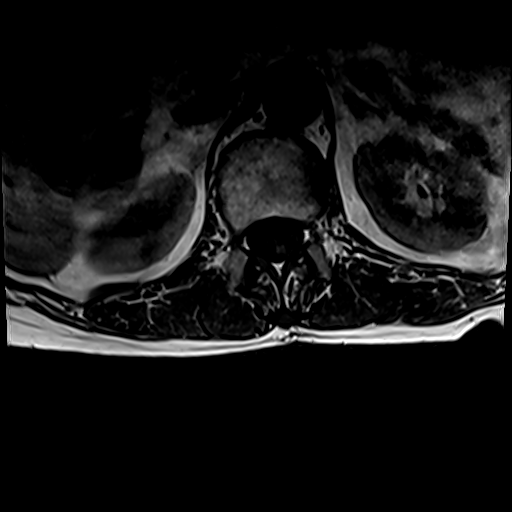
[im 43/43]
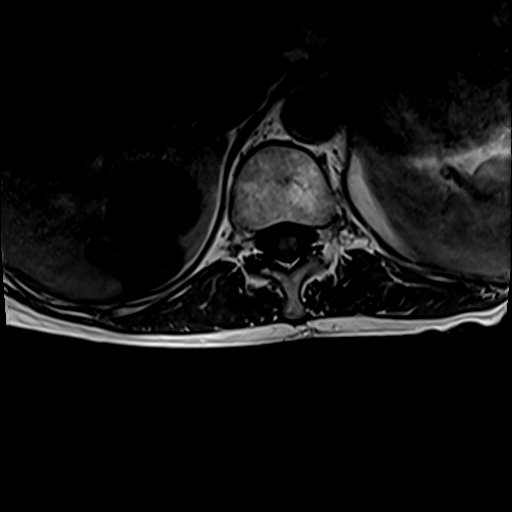

[Series 8: T2 · axial · 4.0mm · 0.62mm/px · z∈[-85,+132]mm · 6 of 43 slices shown]
[im 1/43]
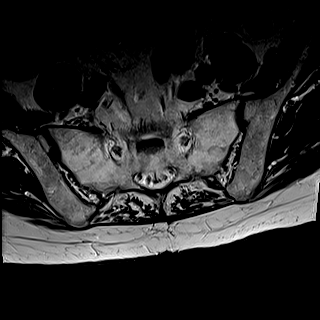
[im 9/43]
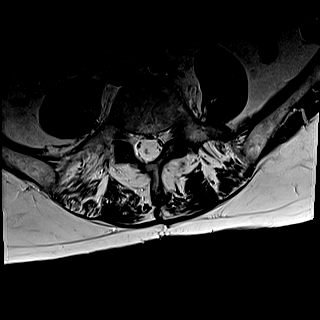
[im 17/43]
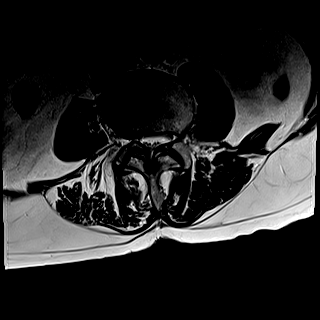
[im 26/43]
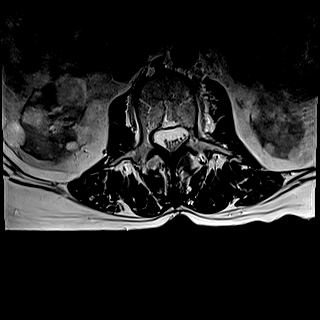
[im 34/43]
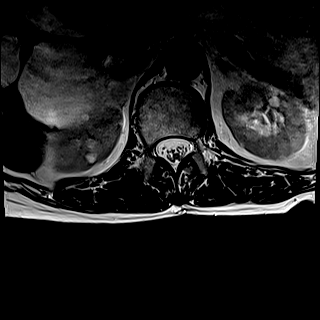
[im 43/43]
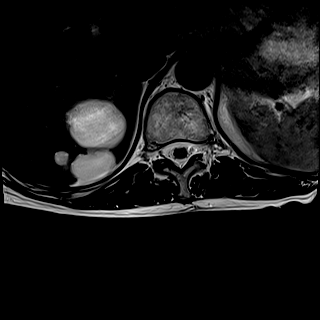

[Series 9: T2 post-contrast · sagittal · 4.0mm · 0.81mm/px · 3 of 17 slices shown]
[im 1/17]
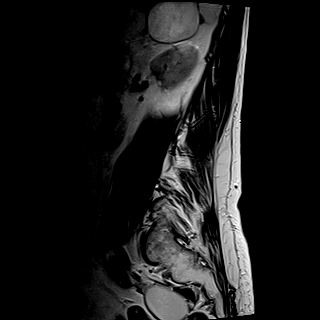
[im 9/17]
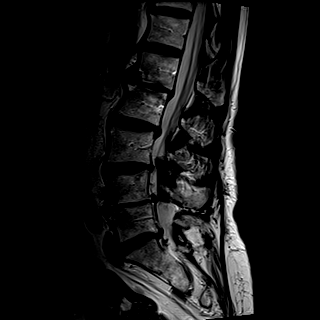
[im 17/17]
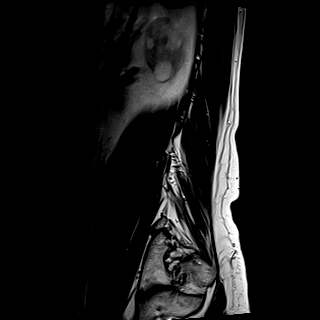

[Series 10: T1 fat-sat post-contrast · sagittal · 4.0mm · 0.81mm/px · 3 of 17 slices shown]
[im 1/17]
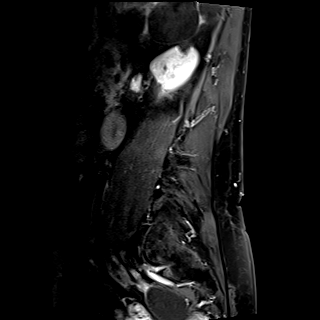
[im 9/17]
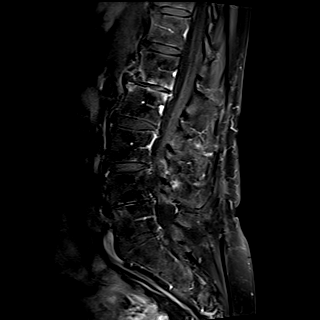
[im 17/17]
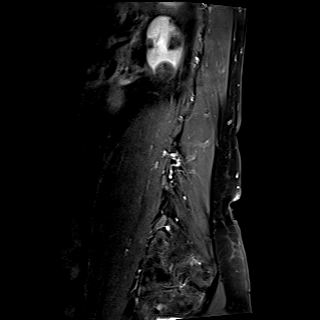

[Series 11: T1 post-contrast · axial · 4.0mm · 0.43mm/px · z∈[-92,-53]mm · 2 of 43 slices shown]
[im 1/43]
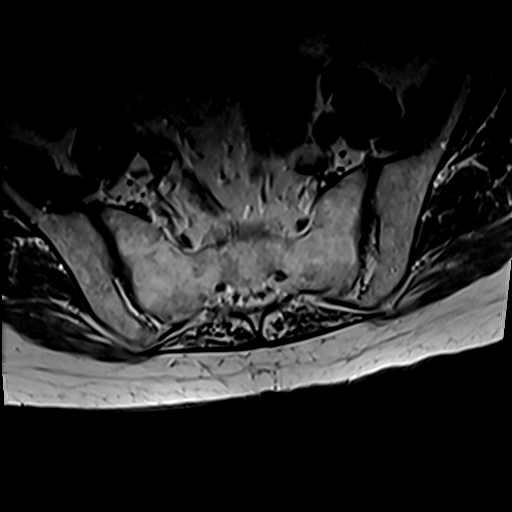
[im 9/43]
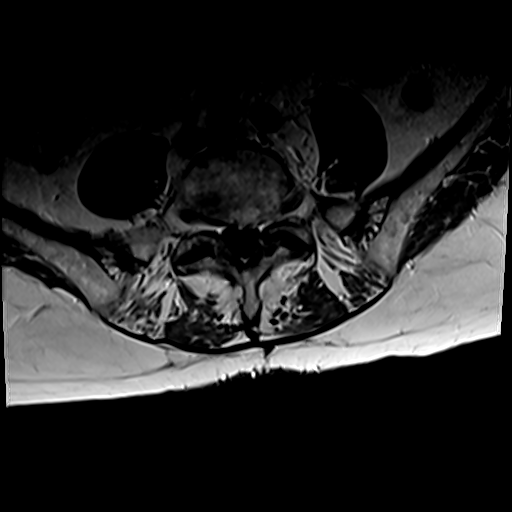

[26 of 48 positions shown; findings below may reference images not displayed]

FINDINGS: Segmentation:  Normal

Alignment: Mild retrolisthesis L1-2 and L2-3. Mild retrolisthesis
L5-S1. Moderate levoscoliosis at L4-5. Mild dextroscoliosis L1-2.

Vertebrae:  Negative for fracture.

Asymmetric disc degeneration on the left at L1-2 related to
scoliosis. There is enhancement of the endplates on the left as
noted on prior MRI of the abdomen. This is related to disc
degeneration with mild bone marrow edema and enhancement.

10 mm enhancing lesion inferior endplate of T12 on the left. This
appears to correspond to the lesion described on MRI. This extends
to the inferior endplate however no Schmorl's node identified. This
lesion is hypointense on T1 and very hyperintense on STIR. Axial T2
images show a starburst appearance suggestive of hemangioma. No
extraosseous extension.

Conus medullaris and cauda equina: Conus extends to the L1 level.
Conus and cauda equina appear normal.

Paraspinal and other soft tissues: Numerous hepatic and renal cysts.
No retroperitoneal adenopathy.

Disc levels:

T12-L1: Negative

L1-2: Asymmetric disc degeneration on the left with disc space
narrowing and spurring. There is associated bone marrow edema
enhancement on the left due to ongoing degenerative change. There is
moderate subarticular stenosis on the left

L2-3: Disc degeneration with diffuse disc bulging. Shallow
right-sided disc protrusion with moderate right subarticular
stenosis. Mild subarticular stenosis on the left and mild spinal
stenosis

L3-4: Disc degeneration with diffuse disc bulging. Moderate facet
degeneration. Mild spinal stenosis and moderate subarticular
stenosis bilaterally. Mild to moderate right foraminal stenosis.

L4-5: Left laminectomy. Advanced disc degeneration with marked disc
space narrowing. Diffuse endplate spurring right greater than left.
Moderate subarticular and foraminal stenosis on the right. Mild
subarticular stenosis on the left and mild spinal stenosis.

L5-S1: Asymmetric disc degeneration and spurring on the left.
Bilateral mild facet degeneration. Small left paracentral disc
protrusion with impingement of the left S1 nerve root. Nerve roots
are adherent to the dura suggesting arachnoiditis.
IMPRESSION: 1 cm lesion inferior endplate of T12 on the left appears to
correspond to the described abnormality on abdominal MRI. This is
most likely a lipid poor hemangioma.

Asymmetric disc degeneration and spurring on the left with
enhancement at L1-2 with moderate subarticular stenosis on the left

Moderate right subarticular stenosis L2-3 due to shallow disc
protrusion and facet hypertrophy.

Mild spinal stenosis L3-4 with moderate subarticular stenosis
bilaterally.

Left laminectomy L4-5 with advanced disc degeneration. Mild
subarticular stenosis on the left with mild spinal stenosis.
Moderate subarticular stenosis on the right

Small left paracentral disc protrusion L5-S1 with mild impingement
left S1 nerve root. There is probable arachnoiditis.

## 2020-12-25 ENCOUNTER — Other Ambulatory Visit: Payer: Self-pay | Admitting: Gastroenterology

## 2020-12-25 ENCOUNTER — Ambulatory Visit (HOSPITAL_COMMUNITY)
Admission: RE | Admit: 2020-12-25 | Discharge: 2020-12-25 | Disposition: A | Discharge status: Home / Self Care | Payer: Medicare HMO | Source: Ambulatory Visit | Attending: Gastroenterology | Admitting: Gastroenterology

## 2020-12-25 ENCOUNTER — Other Ambulatory Visit: Payer: Self-pay

## 2020-12-25 DIAGNOSIS — K7689 Other specified diseases of liver: Secondary | ICD-10-CM

## 2020-12-25 DIAGNOSIS — N281 Cyst of kidney, acquired: Secondary | ICD-10-CM | POA: Diagnosis not present

## 2020-12-25 DIAGNOSIS — K862 Cyst of pancreas: Secondary | ICD-10-CM

## 2020-12-25 DIAGNOSIS — D1809 Hemangioma of other sites: Secondary | ICD-10-CM | POA: Diagnosis not present

## 2020-12-25 MED ORDER — GADOBUTROL 1 MMOL/ML IV SOLN
6.0000 mL | Freq: Once | INTRAVENOUS | Status: AC | PRN
  Administered 2020-12-25: 6 mL via INTRAVENOUS

## 2021-01-01 ENCOUNTER — Encounter: Payer: Self-pay | Admitting: Gastroenterology

## 2021-01-01 ENCOUNTER — Ambulatory Visit: Payer: Medicare HMO | Admitting: Gastroenterology

## 2021-01-01 VITALS — BP 126/70 | HR 58 | Ht 66.5 in | Wt 129.8 lb

## 2021-01-01 DIAGNOSIS — R1011 Right upper quadrant pain: Secondary | ICD-10-CM

## 2021-01-01 DIAGNOSIS — K7689 Other specified diseases of liver: Secondary | ICD-10-CM

## 2021-01-01 DIAGNOSIS — K862 Cyst of pancreas: Secondary | ICD-10-CM | POA: Diagnosis not present

## 2021-01-01 NOTE — Progress Notes (Signed)
[facility] Progress Note  Chief Complaint: Hepatic and pancreatic cysts  Subjective  History: From my September 21 office note: "This is a pleasant 86 year old woman referred by primary care and her cardiologist for abnormal findings on GI imaging.Samantha Henderson was undergoing echocardiogram and hepatic cysts were seen.  This prompted an ultrasound with other findings and then a subsequent MRI.  All those reports are noted below. Samantha Henderson was very concerned about these findings, and wanted to know why she is developing cysts, why they are "spreading", and whether more would occur in the future.  She was also concerned that the kidney cysts might lead to kidney failure and dialysis.  She also concerns because she believes her father had pancreatic cancer.   Samantha Henderson has had some intermittent sharp right upper quadrant pain over the last few weeks nonradiating, not associated with eating position of bowel movements.  It usually lasts about 30 seconds.  She has not had fevers chills night sweats or weight loss."  I recommended an MRI abdomen in a year, it was recently performed with report listed below.  Samantha Henderson continues to have some occasional right upper quadrant "twinge" might occur with certain movement or taking a deep breath.  It has been present for years.  Denies nausea vomiting, early satiety weight loss or altered bowel habits.  She thought she might have a parasite, so took a supplement called "parasite blast", she showed me the bottle and it appears to be a zinc supplement. Samantha Henderson remains concerned about the possibility of having her developing pancreatic cancer, especially with her family history (see prior note).  ROS: Cardiovascular:  no chest pain Respiratory: no dyspnea  The patient's Past Medical, Family and Social History were reviewed and are on file in the EMR.  Objective:  Med list reviewed  Current Outpatient Medications:    Acetaminophen (TYLENOL PO), Take 650 mg by mouth as  needed., Disp: , Rfl:    apixaban (ELIQUIS) 2.5 MG TABS tablet, Take 1 tablet (2.5 mg total) by mouth 2 (two) times daily., Disp: 180 tablet, Rfl: 1   atorvastatin (LIPITOR) 10 MG tablet, Take by mouth daily. , Disp: , Rfl:    calcium carbonate (OS-CAL) 1250 (500 Ca) MG chewable tablet, Chew by mouth. 1-3 chewables as needed, Disp: , Rfl:    Cholecalciferol (VITAMIN D3) 125 MCG (5000 UT) CAPS, Take 1 capsule by mouth daily. , Disp: , Rfl:    diltiazem (CARDIZEM CD) 120 MG 24 hr capsule, Take 1 capsule (120 mg total) by mouth daily. Please make yearly appt with Dr. Radford Pax for August 2022 for future refills. Thank you 1st attempt, Disp: 90 capsule, Rfl: 1   diltiazem (CARDIZEM) 30 MG tablet, Take 1 tablet (30 mg total) by mouth daily as needed. For breakthrough atrial fibrillation., Disp: 90 tablet, Rfl: 3   doxylamine, Sleep, (UNISOM) 25 MG tablet, Take 25 mg by mouth at bedtime as needed., Disp: , Rfl:    hydrochlorothiazide (HYDRODIURIL) 25 MG tablet, Taking one tablet by mouth daily, Disp: , Rfl:    IBUPROFEN PO, Take 650 mg by mouth every evening. , Disp: , Rfl:    levothyroxine (SYNTHROID) 88 MCG tablet, Take 1 tablet (88 mcg total) by mouth daily., Disp: 90 tablet, Rfl: 3   Magnesium Oxide -Mg Supplement 250 MG TABS, Take 1 tablet by mouth daily., Disp: , Rfl:    meclizine (ANTIVERT) 12.5 MG tablet, Take 12.5 mg by mouth 3 (three) times daily as needed., Disp: , Rfl:  Melatonin 10 MG TABS, Take 10 mg by mouth at bedtime., Disp: , Rfl:    ondansetron (ZOFRAN) 4 MG tablet, Take 4 mg by mouth 3 (three) times daily as needed., Disp: , Rfl:    polyethylene glycol (MIRALAX / GLYCOLAX) packet, Take 17 g by mouth daily., Disp: , Rfl:    predniSONE (DELTASONE) 5 MG tablet, Take 5 mg by mouth daily., Disp: , Rfl:    traMADol (ULTRAM) 50 MG tablet, Take 1 tablet (50 mg total) by mouth every 6 (six) hours as needed., Disp: 30 tablet, Rfl: 0   Turmeric 500 MG CAPS, Take 500 mg by mouth daily., Disp: ,  Rfl:    Vital signs in last 24 hrs: Vitals:   01/01/21 0919  BP: 126/70  Pulse: (!) 58   Wt Readings from Last 3 Encounters:  01/01/21 129 lb 12.8 oz (58.9 kg)  01/16/20 129 lb 9.6 oz (58.8 kg)  12/29/19 129 lb 9.6 oz (58.8 kg)    Physical Exam  Thin as before, no muscle wasting HEENT: sclera anicteric, oral mucosa moist without lesions Neck: supple, no thyromegaly, JVD or lymphadenopathy Cardiac: RRR without murmurs, S1S2 heard, no peripheral edema Pulm: clear to auscultation bilaterally, normal RR and effort noted Abdomen: soft, no tenderness, with active bowel sounds. No guarding or palpable hepatosplenomegaly. Skin; warm and dry, no jaundice or rash  Labs:   ___________________________________________ Radiologic studies:  CLINICAL DATA:  Hepatic and pancreatic cysts   EXAM: MRI ABDOMEN WITHOUT AND WITH CONTRAST   TECHNIQUE: Multiplanar multisequence MR imaging of the abdomen was performed both before and after the administration of intravenous contrast.   CONTRAST:  16m GADAVIST GADOBUTROL 1 MMOL/ML IV SOLN   COMPARISON:  11/14/2019   FINDINGS: Lower chest: No acute findings.   Hepatobiliary: There are numerous cysts throughout the liver parenchyma, unchanged, as on prior examination some which demonstrate intrinsic T1 hyperintensity consistent with hemorrhagic or proteinaceous contents. No solid mass or other parenchymal abnormality identified. No gallstones or gallbladder wall thickening. No biliary ductal dilatation.   Pancreas: Adjacent subcentimeter cystic lesions in the pancreatic head and uncinate, measuring up to 1.1 cm, unchanged (series 4, image 26, 25). No solid mass, suspicious contrast enhancement, inflammatory changes, or other parenchymal abnormality identified. No pancreatic ductal dilatation.   Spleen:  Within normal limits in size and appearance.   Adrenals/Urinary Tract: No solid masses identified. Numerous bilateral renal cysts of  varying sizes, without solid component or contrast enhancement. No evidence of hydronephrosis.   Stomach/Bowel: Visualized portions within the abdomen are unremarkable.   Vascular/Lymphatic: No pathologically enlarged lymph nodes identified. No abdominal aortic aneurysm demonstrated.   Other:  None.   Musculoskeletal: No suspicious bone lesions identified. Incidental, benign, small contrast enhancing vertebral body hemangioma of the inferior endplate of [id number](series 29, image 43).   IMPRESSION: 1. Numerous definitively benign liver cysts, unchanged compared to prior examination, some of which with hemorrhagic or proteinaceous contents. No further follow-up or characterization is required.   2. Unchanged subcentimeter cystic lesions in the pancreatic head and uncinate, measuring up to 1.1 cm. These are most likely small side branch IPMNs or pseudocysts. As there is no observed increased risk of malignancy for such lesions smaller than 2 cm, particularly given initially established stability, no further follow-up or characterization is required.   3. Stable, definitively benign vertebral body hemangioma of the inferior endplate of T624THL No further follow-up or characterization is required.   4. Numerous bilateral definitively benign renal cysts, for which  no further follow-up or characterization is required.     Electronically Signed   By: Eddie Candle M.D.   On: 12/26/2020 08:01  ____________________________________________ Other:   _____________________________________________ Assessment & Plan  Assessment: Encounter Diagnoses  Name Primary?   Hepatic cyst Yes   Pancreatic cyst    RUQ pain    This pain sounds benign, unknown if it is related to the multiple cysts but most likely not. I reassured her that the radiologist's impression as well as the stability of all these findings indicates they are benign and likely to remain so.  In addition, surveillance imaging  is not recommended. Lastly, she asked if I could prescribe another medication in case she has a parasite infection, but there is no indication for that.  Plan:  I would gladly see her again as needed.  20 minutes were spent on this encounter (including chart review, history/exam, counseling/coordination of care, and documentation) > 50% of that time was spent on counseling and coordination of care.   Nelida Meuse III

## 2021-01-01 NOTE — Patient Instructions (Signed)
If you are age 84 or older, your body mass index should be between 23-30. Your Body mass index is 20.64 kg/m. If this is out of the aforementioned range listed, please consider follow up with your Primary Care Provider.  If you are age 48 or younger, your body mass index should be between 19-25. Your Body mass index is 20.64 kg/m. If this is out of the aformentioned range listed, please consider follow up with your Primary Care Provider.   __________________________________________________________  The [facility] providers would like to encourage you to use [facility] to communicate with providers for non-urgent requests or questions.  Due to long hold times on the telephone, sending your provider a message by [facility] may be a faster and more efficient way to get a response.  Please allow 48 business hours for a response.  Please remember that this is for non-urgent requests.   It was a pleasure to see you today!  Thank you for trusting me with your gastrointestinal care!

## 2021-01-02 DIAGNOSIS — I10 Essential (primary) hypertension: Secondary | ICD-10-CM | POA: Diagnosis not present

## 2021-01-02 DIAGNOSIS — E890 Postprocedural hypothyroidism: Secondary | ICD-10-CM | POA: Diagnosis not present

## 2021-01-02 DIAGNOSIS — E7800 Pure hypercholesterolemia, unspecified: Secondary | ICD-10-CM | POA: Diagnosis not present

## 2021-01-02 DIAGNOSIS — M069 Rheumatoid arthritis, unspecified: Secondary | ICD-10-CM | POA: Diagnosis not present

## 2021-01-02 DIAGNOSIS — N183 Chronic kidney disease, stage 3 unspecified: Secondary | ICD-10-CM | POA: Diagnosis not present

## 2021-01-02 DIAGNOSIS — K219 Gastro-esophageal reflux disease without esophagitis: Secondary | ICD-10-CM | POA: Diagnosis not present

## 2021-01-02 DIAGNOSIS — I480 Paroxysmal atrial fibrillation: Secondary | ICD-10-CM | POA: Diagnosis not present

## 2021-01-03 DIAGNOSIS — M0579 Rheumatoid arthritis with rheumatoid factor of multiple sites without organ or systems involvement: Secondary | ICD-10-CM | POA: Diagnosis not present

## 2021-01-03 DIAGNOSIS — Z6820 Body mass index (BMI) 20.0-20.9, adult: Secondary | ICD-10-CM | POA: Diagnosis not present

## 2021-01-03 DIAGNOSIS — M154 Erosive (osteo)arthritis: Secondary | ICD-10-CM | POA: Diagnosis not present

## 2021-01-03 DIAGNOSIS — M1991 Primary osteoarthritis, unspecified site: Secondary | ICD-10-CM | POA: Diagnosis not present

## 2021-01-03 DIAGNOSIS — M545 Low back pain, unspecified: Secondary | ICD-10-CM | POA: Diagnosis not present

## 2021-01-14 ENCOUNTER — Ambulatory Visit: Payer: Medicare HMO | Admitting: Psychiatry

## 2021-01-16 ENCOUNTER — Ambulatory Visit: Payer: Medicare HMO | Admitting: Cardiology

## 2021-01-16 ENCOUNTER — Other Ambulatory Visit: Payer: Self-pay

## 2021-01-16 ENCOUNTER — Encounter: Payer: Self-pay | Admitting: Cardiology

## 2021-01-16 VITALS — BP 130/72 | HR 63 | Ht 66.5 in | Wt 132.0 lb

## 2021-01-16 DIAGNOSIS — I4819 Other persistent atrial fibrillation: Secondary | ICD-10-CM | POA: Diagnosis not present

## 2021-01-16 DIAGNOSIS — I351 Nonrheumatic aortic (valve) insufficiency: Secondary | ICD-10-CM

## 2021-01-16 DIAGNOSIS — I10 Essential (primary) hypertension: Secondary | ICD-10-CM

## 2021-01-16 DIAGNOSIS — I480 Paroxysmal atrial fibrillation: Secondary | ICD-10-CM

## 2021-01-16 DIAGNOSIS — I340 Nonrheumatic mitral (valve) insufficiency: Secondary | ICD-10-CM

## 2021-01-16 DIAGNOSIS — R600 Localized edema: Secondary | ICD-10-CM

## 2021-01-16 MED ORDER — DILTIAZEM HCL 30 MG PO TABS: 30.0000 mg | ORAL_TABLET | Freq: Every day | ORAL | 3 refills | Status: DC | PRN

## 2021-01-16 MED ORDER — DILTIAZEM HCL ER COATED BEADS 120 MG PO CP24: 120.0000 mg | ORAL_CAPSULE | Freq: Every day | ORAL | 1 refills | Status: DC

## 2021-01-16 MED ORDER — APIXABAN 2.5 MG PO TABS: 2.5000 mg | ORAL_TABLET | Freq: Two times a day (BID) | ORAL | 1 refills | Status: DC

## 2021-01-16 NOTE — Addendum Note (Signed)
Addended by: Antonieta Iba on: 01/16/2021 11:07 AM   Modules accepted: Orders

## 2021-01-16 NOTE — Progress Notes (Signed)
Cardiology Office Note:    Date:  01/16/2021   ID:  Samantha Henderson, DOB [birth year], MRN [id number]  PCP:  Aretta Nip, MD  Cardiologist:  Fransico Him, MD    Referring MD: Leighton Ruff, MD   Chief Complaint  Patient presents with   Atrial Fibrillation   Hypertension   Mitral Regurgitation   Aortic Insuffiency     History of Present Illness:    Samantha Henderson is a 86 y.o. female with a hx of persistent atrial fibrillation, moderate AR by 2D echocardiogram 06/10/2016, mild MR and HTN. When I last saw her she was complaining of episodes of arm pain with radiation into her chest with no associated symptoms.  She also was having palpitations.  She underwent nuclear stress test which showed no ischemia and 2D echo showed normal LVF with G2DD and mild MR and AR.  Event monitor showed recurrent PAF with RVR and she was referred to afib clinic. Due to bradycardia at baseline her BB could not be increased and since her PAF was not felt to be impacting QOL AADT was not started.    She is here today for followup and is doing well.  She denies any chest pain or pressure, SOB, DOE, PND, orthopnea, LE edema(except if she has been standing too long), dizziness (occasionally has some vertigo), palpitations or syncope. She is compliant with her meds and is tolerating meds with no SE.     Past Medical History:  Diagnosis Date   Abdominal bruit    Abdominal bruit in the past no aortic aneurysm by evaluation in the past   Aortic regurgitation    Moderate by echo 05/2016   Arm weakness    Arm weakness with exercise, January, 2012   Benign essential HTN 01/27/2017   Bradycardia    Carotid stenosis    1-39% bilateral by dopplers 09/2019   Constipation    Constipation with diltiazem March, 1027   Diastolic dysfunction    Mild   Dizziness    Ejection fraction 2011   55-60% ejection fraction, echo, January, 2012   HTN (hypertension)    Hypothyroidism    Leg cramps    from lipitor in the past    Mitral regurgitation    echo..04/2007  /  mild..04/2010   Mitral regurgitation 11/15/2018   Mild to moderate by echo 09/2018   PAC (premature atrial contraction)    Palpitation    Pericardial effusion    small...posterior...echo..04/2007   Polymyalgia ([place])    Pulmonary hypertension (HCC)    Rheumatic fever    Questionable history of rheumatic fever, but no history of valvular abnormalities   Sinus tachycardia    Temporal arteritis (HCC)    Rule out temporal arteritis in the past   Tricuspid regurgitation    -moderate...echo..04/2007...Marland KitchenMarland KitchenEF  60%..echo..04/2007  /   55-60%....echo..05/21/2010   Varicose veins of both lower extremities     Past Surgical History:  Procedure Laterality Date   BACK SURGERY     BUNIONECTOMY     HIP PINNING,CANNULATED Left 04/12/2016   Procedure: CANNULATED HIP PINNING;  Surgeon: Marchia Bond, MD;  Location: [facility];  Service: Orthopedics;  Laterality: Left;   NECK SURGERY     TOTAL ABDOMINAL HYSTERECTOMY      Current Medications: Current Meds  Medication Sig   Acetaminophen (TYLENOL PO) Take 650 mg by mouth as needed.   atorvastatin (LIPITOR) 10 MG tablet Take by mouth daily.    calcium carbonate (OS-CAL) 1250 (500  Ca) MG chewable tablet Chew by mouth. 1-3 chewables as needed   Cholecalciferol (VITAMIN D3) 125 MCG (5000 UT) CAPS Take 1 capsule by mouth daily.    diltiazem (CARDIZEM) 30 MG tablet Take 1 tablet (30 mg total) by mouth daily as needed. For breakthrough atrial fibrillation.   doxylamine, Sleep, (UNISOM) 25 MG tablet Take 25 mg by mouth at bedtime as needed.   hydrochlorothiazide (HYDRODIURIL) 25 MG tablet Taking one tablet by mouth daily   IBUPROFEN PO Take 650 mg by mouth every evening.    levothyroxine (SYNTHROID) 88 MCG tablet Take 1 tablet (88 mcg total) by mouth daily.   Magnesium Oxide -Mg Supplement 250 MG TABS Take 1 tablet by mouth daily.   meclizine (ANTIVERT) 12.5 MG tablet Take 12.5 mg by mouth 3 (three) times daily as needed.    Melatonin 10 MG TABS Take 10 mg by mouth at bedtime.   ondansetron (ZOFRAN) 4 MG tablet Take 4 mg by mouth 3 (three) times daily as needed.   polyethylene glycol (MIRALAX / GLYCOLAX) packet Take 17 g by mouth daily.   predniSONE (DELTASONE) 5 MG tablet Take 5 mg by mouth daily.   traMADol (ULTRAM) 50 MG tablet Take 1 tablet (50 mg total) by mouth every 6 (six) hours as needed.   Turmeric 500 MG CAPS Take 500 mg by mouth daily.   [DISCONTINUED] apixaban (ELIQUIS) 2.5 MG TABS tablet Take 1 tablet (2.5 mg total) by mouth 2 (two) times daily.   [DISCONTINUED] diltiazem (CARDIZEM CD) 120 MG 24 hr capsule Take 1 capsule (120 mg total) by mouth daily. Please make yearly appt with Dr. Radford Pax for August 2022 for future refills. Thank you 1st attempt     Allergies:   Niacin and related, Amlodipine, Cefdinir, Celecoxib, Codeine, Nebivolol hcl, Pregabalin, Cephalexin, Ciprofloxacin, Lisinopril, Plaquenil [hydroxychloroquine], Sulfamethoxazole-trimethoprim, Sulfonamide derivatives, and Augmentin [amoxicillin-pot clavulanate]   Social History   Socioeconomic History   Marital status: Widowed    Spouse name: Not on file   Number of children: Not on file   Years of education: Not on file   Highest education level: Not on file  Occupational History   Not on file  Tobacco Use   Smoking status: Never   Smokeless tobacco: Never  Vaping Use   Vaping Use: Never used  Substance and Sexual Activity   Alcohol use: No   Drug use: No   Sexual activity: Not on file  Other Topics Concern   Not on file  Social History Narrative   Not on file   Social Determinants of Health   Financial Resource Strain: Not on file  Food Insecurity: Not on file  Transportation Needs: Not on file  Physical Activity: Not on file  Stress: Not on file  Social Connections: Not on file     Family History: The patient's family history includes Heart disease in her mother; Stroke in her mother.  ROS:   Please see the  history of present illness.    ROS  All other systems reviewed and negative.   EKGs/Labs/Other Studies Reviewed:    The following studies were reviewed today:  2D echo 10/03/2019 IMPRESSIONS    1. Left ventricular ejection fraction, by estimation, is 60 to 65%. The  left ventricle has normal function. The left ventricle has no regional  wall motion abnormalities. There is mild concentric left ventricular  hypertrophy. Left ventricular diastolic  parameters are consistent with Grade II diastolic dysfunction  (pseudonormalization).   2. Right ventricular systolic function  is normal. The right ventricular  size is normal. There is normal pulmonary artery systolic pressure. The  estimated right ventricular systolic pressure is 20.2 mmHg.   3. Left atrial size was mildly dilated.   4. Right atrial size was moderately dilated.   5. The mitral valve is normal in structure. Mild mitral valve  regurgitation. No evidence of mitral stenosis.   6. The aortic valve is tricuspid. Aortic valve regurgitation is mild.  Mild aortic valve sclerosis is present, with no evidence of aortic valve  stenosis.   7. The inferior vena cava is normal in size with greater than 50%  respiratory variability, suggesting right atrial pressure of 3 mmHg.   8. Cystic structure in the liver measuring 5.8 x 5.4cm. Consider  dedicated liver US.   EKG:  EKG is ordered today and showed NSR with nonspecific T wave abnormality  Recent Labs: 10/03/2020: TSH 1.78   Recent Lipid Panel No results found for: CHOL, TRIG, HDL, CHOLHDL, VLDL, LDLCALC, LDLDIRECT  Physical Exam:    VS:  BP 130/72   Pulse 63   Ht 5' 6.5" (1.689 m)   Wt 132 lb (59.9 kg)   SpO2 97%   BMI 20.99 kg/m     Wt Readings from Last 3 Encounters:  01/16/21 132 lb (59.9 kg)  01/01/21 129 lb 12.8 oz (58.9 kg)  01/16/20 129 lb 9.6 oz (58.8 kg)    GEN: Well nourished, well developed in no acute distress HEENT: Normal NECK: No JVD; No carotid  bruits LYMPHATICS: No lymphadenopathy CARDIAC:RRR, no murmurs, rubs, gallops RESPIRATORY:  Clear to auscultation without rales, wheezing or rhonchi  ABDOMEN: Soft, non-tender, non-distended MUSCULOSKELETAL:  No edema; No deformity  SKIN: Warm and dry NEUROLOGIC:  Alert and oriented x 3 PSYCHIATRIC:  Normal affect   ASSESSMENT:    1. Persistent atrial fibrillation (HCC)   2. Paroxysmal A-fib ([place])   3. Benign essential HTN   4. Nonrheumatic aortic valve insufficiency   5. Nonrheumatic mitral valve regurgitation   6. Bilateral leg edema    PLAN:    In order of problems listed above:  1.  PAF -she continues to maintain NSR and denies any palpitations -Continue prescription drug management with Cardizem CD 120mg  daily and Apixaban 2.5mg  BID (dosed for age > 64 and weight < 60kg) and PRN Cardizem short acting 30mg  > refilled  -she has not had any significant bleeding issues on DOAC -Check BMET and CBC  2.  HTN -BP is well controlled on exam today -Continue prescription drug management with Cardizem CD 120mg  daily and HCTZ 25mg  daily>refilled -check BMET  3.  Aortic insufficiency -mild by echo 09/2018  4.  Mitral regurgitation -mild by echo 09/2018  5.  LE edema -chronic in nature but stable on diuretics -continue diuretics and low Na diet   Medication Adjustments/Labs and Tests Ordered: Current medicines are reviewed at length with the patient today.  Concerns regarding medicines are outlined above.  Orders Placed This Encounter  Procedures   EKG 12-Lead    Meds ordered this encounter  Medications   apixaban (ELIQUIS) 2.5 MG TABS tablet    Sig: Take 1 tablet (2.5 mg total) by mouth 2 (two) times daily.    Dispense:  180 tablet    Refill:  1   diltiazem (CARDIZEM CD) 120 MG 24 hr capsule    Sig: Take 1 capsule (120 mg total) by mouth daily. Please make yearly appt with Dr. Radford Pax for August 2022 for future refills.  Thank you 1st attempt    Dispense:  90 capsule     Refill:  1    Please call our office to schedule an yearly appointment with Dr. Radford Pax for April 2022 before anymore refills. [phone]. Thank you 1st attempt     Signed, Fransico Him, MD  01/16/2021 11:01 AM    [place]

## 2021-01-16 NOTE — Patient Instructions (Signed)
Medication Instructions:  Your physician recommends that you continue on your current medications as directed. Please refer to the Current Medication list given to you today.  *If you need a refill on your cardiac medications before your next appointment, please call your pharmacy*   Lab Work: TODAY: BMET and CBC If you have labs (blood work) drawn today and your tests are completely normal, you will receive your results only by: [place] (if you have MyChart) OR A paper copy in the mail If you have any lab test that is abnormal or we need to change your treatment, we will call you to review the results.   Follow-Up: At [facility], you and your health needs are our priority.  As part of our continuing mission to provide you with exceptional heart care, we have created designated Provider Care Teams.  These Care Teams include your primary Cardiologist (physician) and Advanced Practice Providers (APPs -  Physician Assistants and Nurse Practitioners) who all work together to provide you with the care you need, when you need it.   Your next appointment:   1 year(s)  The format for your next appointment:   In Person  Provider:   You may see Fransico Him, MD or one of the following Advanced Practice Providers on your designated Care Team:   Melina Copa, PA-C Ermalinda Barrios, PA-C

## 2021-01-16 NOTE — Progress Notes (Signed)
SCr bumped - please decrease HCTZ to 12.5mg  daily and repeat BMET in 1 week

## 2021-01-17 ENCOUNTER — Telehealth: Payer: Self-pay

## 2021-01-17 ENCOUNTER — Ambulatory Visit (INDEPENDENT_AMBULATORY_CARE_PROVIDER_SITE_OTHER): Payer: Medicare HMO | Admitting: Internal Medicine

## 2021-01-17 VITALS — BP 120/70 | HR 65 | Ht 66.5 in | Wt 130.6 lb

## 2021-01-17 DIAGNOSIS — I4819 Other persistent atrial fibrillation: Secondary | ICD-10-CM

## 2021-01-17 DIAGNOSIS — E890 Postprocedural hypothyroidism: Secondary | ICD-10-CM | POA: Diagnosis not present

## 2021-01-17 DIAGNOSIS — I10 Essential (primary) hypertension: Secondary | ICD-10-CM

## 2021-01-17 DIAGNOSIS — I480 Paroxysmal atrial fibrillation: Secondary | ICD-10-CM

## 2021-01-17 LAB — CBC
Hematocrit: 42 % (ref 34.0–46.6)
Hemoglobin: 14.3 g/dL (ref 11.1–15.9)
MCH: 30.6 pg (ref 26.6–33.0)
MCHC: 34 g/dL (ref 31.5–35.7)
MCV: 90 fL (ref 79–97)
Platelets: 77 10*3/uL — CL (ref 150–450)
RBC: 4.68 x10E6/uL (ref 3.77–5.28)
RDW: 12.1 % (ref 11.7–15.4)
WBC: 10.5 10*3/uL (ref 3.4–10.8)

## 2021-01-17 LAB — BASIC METABOLIC PANEL
BUN/Creatinine Ratio: 15 (ref 12–28)
BUN: 21 mg/dL (ref 8–27)
CO2: 27 mmol/L (ref 20–29)
Calcium: 10.5 mg/dL — ABNORMAL HIGH (ref 8.7–10.3)
Chloride: 99 mmol/L (ref 96–106)
Creatinine, Ser: 1.42 mg/dL — ABNORMAL HIGH (ref 0.57–1.00)
Glucose: 89 mg/dL (ref 70–99)
Potassium: 3.6 mmol/L (ref 3.5–5.2)
Sodium: 143 mmol/L (ref 134–144)
eGFR: 36 mL/min/{1.73_m2} — ABNORMAL LOW (ref >59)

## 2021-01-17 LAB — TSH: TSH: 2.23 u[IU]/mL (ref 0.35–5.50)

## 2021-01-17 MED ORDER — HYDROCHLOROTHIAZIDE 12.5 MG PO CAPS: 12.5000 mg | ORAL_CAPSULE | Freq: Every day | ORAL | 3 refills | Status: DC

## 2021-01-17 NOTE — Progress Notes (Signed)
Name: Samantha Henderson  MRN/ DOB: [id number], 1935-04-03    Age/ Sex: 86 y.o., female     PCP: Aretta Nip, MD   Reason for Endocrinology Evaluation: Postoperative Hypothyroidism      Initial Endocrinology Clinic Visit: 02/22/2018    PATIENT IDENTIFIER: Samantha Henderson is a 86 y.o., female with a past medical history of HTN, temporal arteritis and A.Fib. She has followed with [facility] since 02/22/2018 for consultative assistance with management of her Hypothyroidism .   HISTORICAL SUMMARY: . She is S/P surgical hypothyroidism in 1961 due to [place]. She was always on Levothyroxine 112 mcg up until 2019, when her doses have been gradually reduced due to low TS. On her initial visit to our office she was on Levothyroxine 50 mcg daily with symptomatic hypothyroidism. She was on Biotin at the time. We have asked her to hold it for a few days with repeat TFT's consistent with hypothyroidism TSH 15.61 uIU/mL . We started her on Levothyroxine 100 mcg daily and she was asked to hold Biotin for at least 3 days prior to her lab work in the future.    SUBJECTIVE:    Today (01/17/2021):  Samantha Henderson is here for her a follow up on hypothyroidism.     Weight has been stable  She has chronic constipation , takes miralax regularly.  Denies local neck symptoms  She has difficulty swallowing levothyroxine but not her other pills  Held Biotin 2 days    HISTORY:  Past Medical History:  Past Medical History:  Diagnosis Date   Abdominal bruit    Abdominal bruit in the past no aortic aneurysm by evaluation in the past   Aortic regurgitation    Moderate by echo 05/2016   Arm weakness    Arm weakness with exercise, January, 2012   Benign essential HTN 01/27/2017   Bradycardia    Carotid stenosis    1-39% bilateral by dopplers 09/2019   Constipation    Constipation with diltiazem March, 4709   Diastolic dysfunction    Mild   Dizziness    Ejection fraction 2011   55-60% ejection  fraction, echo, January, 2012   HTN (hypertension)    Hypothyroidism    Leg cramps    from lipitor in the past   Mitral regurgitation    echo..04/2007  /  mild..04/2010   Mitral regurgitation 11/15/2018   Mild to moderate by echo 09/2018   PAC (premature atrial contraction)    Palpitation    Pericardial effusion    small...posterior...echo..04/2007   Polymyalgia ([place])    Pulmonary hypertension (HCC)    Rheumatic fever    Questionable history of rheumatic fever, but no history of valvular abnormalities   Sinus tachycardia    Temporal arteritis (HCC)    Rule out temporal arteritis in the past   Tricuspid regurgitation    -moderate...echo..04/2007...Marland KitchenMarland KitchenEF  60%..echo..04/2007  /   55-60%....echo..05/21/2010   Varicose veins of both lower extremities    Past Surgical History:  Past Surgical History:  Procedure Laterality Date   BACK SURGERY     BUNIONECTOMY     HIP PINNING,CANNULATED Left 04/12/2016   Procedure: CANNULATED HIP PINNING;  Surgeon: Marchia Bond, MD;  Location: [facility];  Service: Orthopedics;  Laterality: Left;   NECK SURGERY     TOTAL ABDOMINAL HYSTERECTOMY     Social History:  reports that she has never smoked. She has never used smokeless tobacco. She reports that she does not drink  alcohol and does not use drugs. Family History: family history includes Heart disease in her mother; Stroke in her mother.   HOME MEDICATIONS: Levothyroxine 100 mcg daily     OBJECTIVE:   PHYSICAL EXAM: VS: BP 120/70 (BP Location: Left Arm, Patient Position: Sitting, Cuff Size: Small)   Pulse 65   Ht 5' 6.5" (1.689 m)   Wt 130 lb 9.6 oz (59.2 kg)   SpO2 97%   BMI 20.76 kg/m    EXAM: General: Pt appears well and is in NAD  Neck: General: Supple without adenopathy. Thyroid: No goiter or nodules appreciated.  Lungs: Clear with good BS bilat with no rales, rhonchi, or wheezes  Heart: Auscultation: RRR.  Abdomen: Normoactive bowel sounds, soft, nontender, without masses or  organomegaly palpable  Extremities:  BL LE: No pretibial edema normal ROM and strength.  Mental Status: Judgment, insight: Intact Orientation: Oriented to time, place, and person Mood and affect: No depression, anxiety, or agitation     DATA REVIEWED:  Results for Samantha Henderson, Samantha Henderson (MRN [id number]) as of 01/18/2021 08:01  Ref. Range 01/17/2021 10:04  TSH Latest Ref Range: 0.35 - 5.50 uIU/mL 2.23     ASSESSMENT / PLAN / RECOMMENDATIONS:   Post-Operative Hypothyroidism :  - She is clinically and biochemically euthyroid  - Will continue current dose of levothyroxine   Medications  Continue  Levothyroxine 88 mcg daily     F/U in 1 yr   Signed electronically by: Mack Guise, MD  [facility]  [facility] [address] [place], [place] [address] Phone: [phone] FAX: [phone]      CC: Aretta Nip, [address] [place] [address] Phone: [phone]  Fax: [phone]   Return to Endocrinology clinic as below: Future Appointments  Date Time Provider [place]  04/23/2021  8:45 AM Hayden Pedro, MD [facility] None  01/16/2022  9:30 AM Arieanna Pressey, Melanie Crazier, MD [facility] None

## 2021-01-17 NOTE — Telephone Encounter (Signed)
The patient has been notified of the result and verbalized understanding.  All questions (if any) were answered. Antonieta Iba, RN 01/17/2021 12:41 PM  Patient will reduce HCTZ to 12.5 mg daily. Repeat BMET has been scheduled.

## 2021-01-17 NOTE — Telephone Encounter (Signed)
-----   Message from Sueanne Margarita, MD sent at 01/16/2021 11:07 PM EDT ----- SCr bumped - please decrease HCTZ to 12.5mg  daily and repeat BMET in 1 week

## 2021-01-18 ENCOUNTER — Telehealth: Payer: Self-pay

## 2021-01-18 ENCOUNTER — Telehealth: Payer: Self-pay | Admitting: Cardiology

## 2021-01-18 ENCOUNTER — Telehealth: Payer: Self-pay | Admitting: Internal Medicine

## 2021-01-18 DIAGNOSIS — D696 Thrombocytopenia, unspecified: Secondary | ICD-10-CM

## 2021-01-18 MED ORDER — LEVOTHYROXINE SODIUM 88 MCG PO TABS: 88.0000 ug | ORAL_TABLET | Freq: Every day | ORAL | 3 refills | Status: DC

## 2021-01-18 NOTE — Telephone Encounter (Signed)
-----   Message from Sueanne Margarita, MD sent at 01/18/2021  9:56 AM EDT ----- Please get her into hematology ASAP due to thrombocytopenia and need to be on Apixaban

## 2021-01-18 NOTE — Telephone Encounter (Signed)
Patient is returning a urgent phone call. Sent to triage

## 2021-01-18 NOTE — Telephone Encounter (Signed)
Left message for patient to call back  

## 2021-01-18 NOTE — Telephone Encounter (Signed)
-----   Message from Sueanne Margarita, MD sent at 01/18/2021  9:56 AM EDT ----- Please get her into hematology ASAP due to thrombocytopenia and need to be on Apixaban   Call sent straight to triage. Informed patient of recommendation as written above.

## 2021-01-18 NOTE — Telephone Encounter (Signed)
Vm left for patient to call back.

## 2021-01-18 NOTE — Telephone Encounter (Signed)
Please let the pt know that her thyroid test is normal and a yr supply of levothyroxine was sent to [facility]

## 2021-01-21 ENCOUNTER — Telehealth: Payer: Self-pay | Admitting: Hematology and Oncology

## 2021-01-21 NOTE — Telephone Encounter (Signed)
Scheduled appt per 9/30 referral. Pt is aware of appt date and time.

## 2021-01-21 NOTE — Telephone Encounter (Signed)
Patient has been notified

## 2021-01-23 ENCOUNTER — Encounter: Payer: Self-pay | Admitting: Hematology and Oncology

## 2021-01-23 ENCOUNTER — Inpatient Hospital Stay: Payer: Medicare HMO

## 2021-01-23 ENCOUNTER — Other Ambulatory Visit: Payer: Self-pay

## 2021-01-23 ENCOUNTER — Telehealth: Payer: Self-pay

## 2021-01-23 ENCOUNTER — Inpatient Hospital Stay: Payer: Medicare HMO | Attending: Hematology and Oncology | Admitting: Hematology and Oncology

## 2021-01-23 VITALS — BP 151/70 | HR 68 | Temp 97.6°F | Resp 16 | Ht 66.5 in | Wt 133.0 lb

## 2021-01-23 DIAGNOSIS — D696 Thrombocytopenia, unspecified: Secondary | ICD-10-CM

## 2021-01-23 DIAGNOSIS — Z79899 Other long term (current) drug therapy: Secondary | ICD-10-CM | POA: Insufficient documentation

## 2021-01-23 DIAGNOSIS — M069 Rheumatoid arthritis, unspecified: Secondary | ICD-10-CM | POA: Insufficient documentation

## 2021-01-23 DIAGNOSIS — I482 Chronic atrial fibrillation, unspecified: Secondary | ICD-10-CM | POA: Diagnosis not present

## 2021-01-23 DIAGNOSIS — Z7901 Long term (current) use of anticoagulants: Secondary | ICD-10-CM | POA: Insufficient documentation

## 2021-01-23 LAB — RETICULOCYTES
Immature Retic Fract: 10.7 % (ref 2.3–15.9)
RBC.: 4.62 MIL/uL (ref 3.87–5.11)
Retic Count, Absolute: 63.8 10*3/uL (ref 19.0–186.0)
Retic Ct Pct: 1.4 % (ref 0.4–3.1)

## 2021-01-23 LAB — COMPREHENSIVE METABOLIC PANEL
ALT: 15 U/L (ref 0–44)
AST: 22 U/L (ref 15–41)
Albumin: 4.2 g/dL (ref 3.5–5.0)
Alkaline Phosphatase: 43 U/L (ref 38–126)
Anion gap: 10 (ref 5–15)
BUN: 19 mg/dL (ref 8–23)
CO2: 32 mmol/L (ref 22–32)
Calcium: 10 mg/dL (ref 8.9–10.3)
Chloride: 101 mmol/L (ref 98–111)
Creatinine, Ser: 1.4 mg/dL — ABNORMAL HIGH (ref 0.44–1.00)
GFR, Estimated: 37 mL/min — ABNORMAL LOW (ref >60)
Glucose, Bld: 65 mg/dL — ABNORMAL LOW (ref 70–99)
Potassium: 3.7 mmol/L (ref 3.5–5.1)
Sodium: 143 mmol/L (ref 135–145)
Total Bilirubin: 0.8 mg/dL (ref 0.3–1.2)
Total Protein: 6.9 g/dL (ref 6.5–8.1)

## 2021-01-23 LAB — IRON AND TIBC
Iron: 98 ug/dL (ref 41–142)
Saturation Ratios: 31 % (ref 21–57)
TIBC: 313 ug/dL (ref 236–444)
UIBC: 214 ug/dL (ref 120–384)

## 2021-01-23 LAB — LACTATE DEHYDROGENASE: LDH: 191 U/L (ref 98–192)

## 2021-01-23 LAB — CBC WITH DIFFERENTIAL/PLATELET
Abs Immature Granulocytes: 0.05 10*3/uL (ref 0.00–0.07)
Basophils Absolute: 0 10*3/uL (ref 0.0–0.1)
Basophils Relative: 0 %
Eosinophils Absolute: 0.1 10*3/uL (ref 0.0–0.5)
Eosinophils Relative: 1 %
HCT: 43 % (ref 36.0–46.0)
Hemoglobin: 14 g/dL (ref 12.0–15.0)
Immature Granulocytes: 1 %
Lymphocytes Relative: 15 %
Lymphs Abs: 1.5 10*3/uL (ref 0.7–4.0)
MCH: 30.1 pg (ref 26.0–34.0)
MCHC: 32.6 g/dL (ref 30.0–36.0)
MCV: 92.5 fL (ref 80.0–100.0)
Monocytes Absolute: 0.6 10*3/uL (ref 0.1–1.0)
Monocytes Relative: 6 %
Neutro Abs: 7.9 10*3/uL — ABNORMAL HIGH (ref 1.7–7.7)
Neutrophils Relative %: 77 %
Platelets: 244 10*3/uL (ref 150–400)
RBC: 4.65 MIL/uL (ref 3.87–5.11)
RDW: 13 % (ref 11.5–15.5)
WBC: 10.2 10*3/uL (ref 4.0–10.5)
nRBC: 0 % (ref 0.0–0.2)

## 2021-01-23 LAB — VITAMIN B12: Vitamin B-12: 2518 pg/mL — ABNORMAL HIGH (ref 180–914)

## 2021-01-23 LAB — TSH: TSH: 3.453 u[IU]/mL (ref 0.308–3.960)

## 2021-01-23 LAB — FERRITIN: Ferritin: 50 ng/mL (ref 11–307)

## 2021-01-23 NOTE — Telephone Encounter (Signed)
Called and LVM for patient. Informed her that, per Dr Chryl Heck, plt count was normal today. Also informed her that vitamin B12 level was elevated and she should stop taking supplementation. Left number for call back.

## 2021-01-23 NOTE — Progress Notes (Signed)
[facility] CONSULT NOTE  Patient Care Team: Rankins, Bill Salinas, MD as PCP - General (Family Medicine) Sueanne Margarita, MD as PCP - Cardiology (Cardiology)  CHIEF COMPLAINTS/PURPOSE OF CONSULTATION:  Thrombocytopenia.  ASSESSMENT & PLAN:   This is a very pleasant 86 year old female patient with past medical history significant for rheumatoid arthritis, chronic atrial fibrillation, valvular heart issues referred to hematology for evaluation of thrombocytopenia especially in the setting of concomitant use of blood thinners.  She has had chronic thrombocytopenia upon review of labs for several years.  Physical examination today unremarkable, no evidence of petechiae, ecchymosis or lower extremity edema.  No hepatosplenomegaly.  We have reviewed the following findings about thrombocytopenia. Thrombocytopenia is defined as a platelet count below the lower limit of normal (ie, <150,000/microL [150 x 109/L] for adults).  Degrees of thrombocytopenia can be further subdivided into mild (platelet count 100,000 to 150,000/microL), moderate (50,000 to 99,000/microL), and severe (<50,000/microL) Most common causes of thrombocytopenia include but not limited to chronic liver disease or hypersplenism, immune thrombocytopenia, viral infections such as Hepatitis, HIV, active bacterial infections, autoimmune diseases, alcohol, nutritional deficiencies and medications. Rarely bone marrow disorders such as myelodysplatic syndrome, bone marrow failure syndromes, acute leukemia and PNH can present with thrombocytopenia. Additional rare causes of thrombocytopenia include vascular conditions associated with platelet destruction (eg, giant capillary hemangioma, large aortic aneurysms, cardiopulmonary bypass, intraaortic balloon pumps.  At this time she appears to have moderate thrombocytopenia which is more chronic.  I have recommended proceeding with some laboratory investigation.  This could be chronic ITP  given rheumatoid arthritis versus bone marrow disorders.  Since she does not have any leukopenia or anemia and since thrombocytopenia is chronic, I have low suspicion for primary bone marrow etiology.  She we have discussed that she is at increased risk of bleeding if the platelet count falls below 50,000.  She was asked to go to the nearest hospital if she has any intractable bleeding or very bad headache  She will return to clinic in a couple weeks to review lab results from today and to discuss any additional recommendations. Thank you for consulting [place] in the care of this patient.  Please do not hesitate to contact us with any additional questions or concerns. HISTORY OF PRESENTING ILLNESS:   ERIC MORGANTI 86 y.o. female is here because of thrombocytopenia.  Ms. Coyne arrived to the appointment today by herself.  She denies any complaints today. She denies awareness of thrombocytopenia.  She has baseline osteoarthritis and rheumatoid arthritis and continues on chronic prednisone therapy.  She was tried on immunosuppressants in the past but she did not notice any improvement hence these were discontinued. She denies any bleeding issues.  She has atrial fibrillation hence has been on Eliquis for years now. No known nutritional deficiencies or liver diseases.  She does have multiple liver cysts.  No alcohol consumption.  She otherwise feels good, has not noticed any change in her breathing, bowel habits or urinary habits.  Rest of the pertinent 10 point ROS reviewed and negative.  REVIEW OF SYSTEMS:   Constitutional: Denies fevers, chills or abnormal night sweats Eyes: Denies blurriness of vision, double vision or watery eyes Ears, nose, mouth, throat, and face: Denies mucositis or sore throat Respiratory: Denies cough, dyspnea or wheezes Cardiovascular: Denies palpitation, chest discomfort or lower extremity swelling Gastrointestinal:  Denies nausea, heartburn or change in bowel habits Skin: Denies  abnormal skin rashes Lymphatics: Denies new lymphadenopathy or easy bruising Neurological:Denies numbness, tingling  or new weaknesses Behavioral/Psych: Mood is stable, no new changes  All other systems were reviewed with the patient and are negative.  MEDICAL HISTORY:  Past Medical History:  Diagnosis Date   Abdominal bruit    Abdominal bruit in the past no aortic aneurysm by evaluation in the past   Aortic regurgitation    Moderate by echo 05/2016   Arm weakness    Arm weakness with exercise, January, 2012   Benign essential HTN 01/27/2017   Bradycardia    Carotid stenosis    1-39% bilateral by dopplers 09/2019   Constipation    Constipation with diltiazem March, 2423   Diastolic dysfunction    Mild   Dizziness    Ejection fraction 2011   55-60% ejection fraction, echo, January, 2012   HTN (hypertension)    Hypothyroidism    Leg cramps    from lipitor in the past   Mitral regurgitation    echo..04/2007  /  mild..04/2010   Mitral regurgitation 11/15/2018   Mild to moderate by echo 09/2018   PAC (premature atrial contraction)    Palpitation    Pericardial effusion    small...posterior...echo..04/2007   Polymyalgia ([place])    Pulmonary hypertension (HCC)    Rheumatic fever    Questionable history of rheumatic fever, but no history of valvular abnormalities   Sinus tachycardia    Temporal arteritis (HCC)    Rule out temporal arteritis in the past   Tricuspid regurgitation    -moderate...echo..04/2007...Marland KitchenMarland KitchenEF  60%..echo..04/2007  /   55-60%....echo..05/21/2010   Varicose veins of both lower extremities     SURGICAL HISTORY: Past Surgical History:  Procedure Laterality Date   BACK SURGERY     BUNIONECTOMY     HIP PINNING,CANNULATED Left 04/12/2016   Procedure: CANNULATED HIP PINNING;  Surgeon: Marchia Bond, MD;  Location: [facility];  Service: Orthopedics;  Laterality: Left;   NECK SURGERY     TOTAL ABDOMINAL HYSTERECTOMY      SOCIAL HISTORY: Social History   Socioeconomic  History   Marital status: Widowed    Spouse name: Not on file   Number of children: Not on file   Years of education: Not on file   Highest education level: Not on file  Occupational History   Not on file  Tobacco Use   Smoking status: Never   Smokeless tobacco: Never  Vaping Use   Vaping Use: Never used  Substance and Sexual Activity   Alcohol use: No   Drug use: No   Sexual activity: Not on file  Other Topics Concern   Not on file  Social History Narrative   Not on file   Social Determinants of Health   Financial Resource Strain: Not on file  Food Insecurity: Not on file  Transportation Needs: Not on file  Physical Activity: Not on file  Stress: Not on file  Social Connections: Not on file  Intimate Partner Violence: Not on file    FAMILY HISTORY: Family History  Problem Relation Age of Onset   Heart disease Mother    Stroke Mother     ALLERGIES:  is allergic to niacin and related, amlodipine, cefdinir, celecoxib, codeine, nebivolol hcl, pregabalin, cephalexin, ciprofloxacin, lisinopril, plaquenil [hydroxychloroquine], sulfamethoxazole-trimethoprim, sulfonamide derivatives, and augmentin [amoxicillin-pot clavulanate].  MEDICATIONS:  Current Outpatient Medications  Medication Sig Dispense Refill   Acetaminophen (TYLENOL PO) Take 650 mg by mouth as needed.     apixaban (ELIQUIS) 2.5 MG TABS tablet Take 1 tablet (2.5 mg total) by mouth 2 (  two) times daily. 180 tablet 1   atorvastatin (LIPITOR) 10 MG tablet Take by mouth daily.      calcium carbonate (OS-CAL) 1250 (500 Ca) MG chewable tablet Chew by mouth. 1-3 chewables as needed     Cholecalciferol (VITAMIN D3) 125 MCG (5000 UT) CAPS Take 1 capsule by mouth daily.      diltiazem (CARDIZEM CD) 120 MG 24 hr capsule Take 1 capsule (120 mg total) by mouth daily. Please make yearly appt with Dr. Radford Pax for August 2022 for future refills. Thank you 1st attempt 90 capsule 1   diltiazem (CARDIZEM) 30 MG tablet Take 1 tablet  (30 mg total) by mouth daily as needed. For breakthrough atrial fibrillation. 90 tablet 3   doxylamine, Sleep, (UNISOM) 25 MG tablet Take 25 mg by mouth at bedtime as needed.     hydrochlorothiazide (MICROZIDE) 12.5 MG capsule Take 1 capsule (12.5 mg total) by mouth daily. 90 capsule 3   IBUPROFEN PO Take 650 mg by mouth every evening.      levothyroxine (SYNTHROID) 88 MCG tablet Take 1 tablet (88 mcg total) by mouth daily. 90 tablet 3   Magnesium Oxide -Mg Supplement 250 MG TABS Take 1 tablet by mouth daily.     meclizine (ANTIVERT) 12.5 MG tablet Take 12.5 mg by mouth 3 (three) times daily as needed.     Melatonin 10 MG TABS Take 10 mg by mouth at bedtime.     ondansetron (ZOFRAN) 4 MG tablet Take 4 mg by mouth 3 (three) times daily as needed.     polyethylene glycol (MIRALAX / GLYCOLAX) packet Take 17 g by mouth daily.     predniSONE (DELTASONE) 5 MG tablet Take 5 mg by mouth daily.     traMADol (ULTRAM) 50 MG tablet Take 1 tablet (50 mg total) by mouth every 6 (six) hours as needed. 30 tablet 0   Turmeric 500 MG CAPS Take 500 mg by mouth daily.     No current facility-administered medications for this visit.    PHYSICAL EXAMINATION: ECOG PERFORMANCE STATUS: 0 - Asymptomatic  Vitals:   01/23/21 0914  BP: (!) 151/70  Pulse: 68  Resp: 16  Temp: 97.6 F (36.4 C)  SpO2: 98%   Filed Weights   01/23/21 0914  Weight: 133 lb (60.3 kg)    GENERAL:alert, no distress and comfortable SKIN: skin color, texture, turgor are normal, no rashes or significant lesions EYES: normal, conjunctiva are pink and non-injected, sclera clear OROPHARYNX:no exudate, no erythema and lips, buccal mucosa, and tongue normal  NECK: supple, thyroid normal size, non-tender, without nodularity LYMPH:  no palpable lymphadenopathy in the cervical, axillary LUNGS: clear to auscultation and percussion with normal breathing effort HEART: regular rate & rhythm and no murmurs and no lower extremity  edema ABDOMEN:abdomen soft, non-tender and normal bowel sounds Musculoskeletal:no cyanosis of digits and no clubbing  PSYCH: alert & oriented x 3 with fluent speech NEURO: no focal motor/sensory deficits  LABORATORY DATA:  I have reviewed the data as listed Lab Results  Component Value Date   WBC 10.5 01/16/2021   HGB 14.3 01/16/2021   HCT 42.0 01/16/2021   MCV 90 01/16/2021   PLT 77 (LL) 01/16/2021     Chemistry      Component Value Date/Time   NA 143 01/16/2021 1119   K 3.6 01/16/2021 1119   CL 99 01/16/2021 1119   CO2 27 01/16/2021 1119   BUN 21 01/16/2021 1119   CREATININE 1.42 (H) 01/16/2021 1119  CREATININE 1.17 (H) 05/01/2015 1425   GLU 110 04/22/2016 0000      Component Value Date/Time   CALCIUM 10.5 (H) 01/16/2021 1119       RADIOGRAPHIC STUDIES: I have personally reviewed the radiological images as listed and agreed with the findings in the report. MR ABDOMEN W WO CONTRAST  Result Date: 12/26/2020 CLINICAL DATA:  Hepatic and pancreatic cysts EXAM: MRI ABDOMEN WITHOUT AND WITH CONTRAST TECHNIQUE: Multiplanar multisequence MR imaging of the abdomen was performed both before and after the administration of intravenous contrast. CONTRAST:  20mL GADAVIST GADOBUTROL 1 MMOL/ML IV SOLN COMPARISON:  11/14/2019 FINDINGS: Lower chest: No acute findings. Hepatobiliary: There are numerous cysts throughout the liver parenchyma, unchanged, as on prior examination some which demonstrate intrinsic T1 hyperintensity consistent with hemorrhagic or proteinaceous contents. No solid mass or other parenchymal abnormality identified. No gallstones or gallbladder wall thickening. No biliary ductal dilatation. Pancreas: Adjacent subcentimeter cystic lesions in the pancreatic head and uncinate, measuring up to 1.1 cm, unchanged (series 4, image 26, 25). No solid mass, suspicious contrast enhancement, inflammatory changes, or other parenchymal abnormality identified. No pancreatic ductal  dilatation. Spleen:  Within normal limits in size and appearance. Adrenals/Urinary Tract: No solid masses identified. Numerous bilateral renal cysts of varying sizes, without solid component or contrast enhancement. No evidence of hydronephrosis. Stomach/Bowel: Visualized portions within the abdomen are unremarkable. Vascular/Lymphatic: No pathologically enlarged lymph nodes identified. No abdominal aortic aneurysm demonstrated. Other:  None. Musculoskeletal: No suspicious bone lesions identified. Incidental, benign, small contrast enhancing vertebral body hemangioma of the inferior endplate of K93 (series 29, image 43). IMPRESSION: 1. Numerous definitively benign liver cysts, unchanged compared to prior examination, some of which with hemorrhagic or proteinaceous contents. No further follow-up or characterization is required. 2. Unchanged subcentimeter cystic lesions in the pancreatic head and uncinate, measuring up to 1.1 cm. These are most likely small side branch IPMNs or pseudocysts. As there is no observed increased risk of malignancy for such lesions smaller than 2 cm, particularly given initially established stability, no further follow-up or characterization is required. 3. Stable, definitively benign vertebral body hemangioma of the inferior endplate of G18. No further follow-up or characterization is required. 4. Numerous bilateral definitively benign renal cysts, for which no further follow-up or characterization is required. Electronically Signed   By: Eddie Candle M.D.   On: 12/26/2020 08:01   MR 3D Recon At Scanner  Result Date: 12/26/2020 CLINICAL DATA:  Hepatic and pancreatic cysts EXAM: MRI ABDOMEN WITHOUT AND WITH CONTRAST TECHNIQUE: Multiplanar multisequence MR imaging of the abdomen was performed both before and after the administration of intravenous contrast. CONTRAST:  48mL GADAVIST GADOBUTROL 1 MMOL/ML IV SOLN COMPARISON:  11/14/2019 FINDINGS: Lower chest: No acute findings. Hepatobiliary:  There are numerous cysts throughout the liver parenchyma, unchanged, as on prior examination some which demonstrate intrinsic T1 hyperintensity consistent with hemorrhagic or proteinaceous contents. No solid mass or other parenchymal abnormality identified. No gallstones or gallbladder wall thickening. No biliary ductal dilatation. Pancreas: Adjacent subcentimeter cystic lesions in the pancreatic head and uncinate, measuring up to 1.1 cm, unchanged (series 4, image 26, 25). No solid mass, suspicious contrast enhancement, inflammatory changes, or other parenchymal abnormality identified. No pancreatic ductal dilatation. Spleen:  Within normal limits in size and appearance. Adrenals/Urinary Tract: No solid masses identified. Numerous bilateral renal cysts of varying sizes, without solid component or contrast enhancement. No evidence of hydronephrosis. Stomach/Bowel: Visualized portions within the abdomen are unremarkable. Vascular/Lymphatic: No pathologically enlarged lymph nodes identified. No abdominal  aortic aneurysm demonstrated. Other:  None. Musculoskeletal: No suspicious bone lesions identified. Incidental, benign, small contrast enhancing vertebral body hemangioma of the inferior endplate of W10 (series 29, image 43). IMPRESSION: 1. Numerous definitively benign liver cysts, unchanged compared to prior examination, some of which with hemorrhagic or proteinaceous contents. No further follow-up or characterization is required. 2. Unchanged subcentimeter cystic lesions in the pancreatic head and uncinate, measuring up to 1.1 cm. These are most likely small side branch IPMNs or pseudocysts. As there is no observed increased risk of malignancy for such lesions smaller than 2 cm, particularly given initially established stability, no further follow-up or characterization is required. 3. Stable, definitively benign vertebral body hemangioma of the inferior endplate of X32. No further follow-up or characterization is  required. 4. Numerous bilateral definitively benign renal cysts, for which no further follow-up or characterization is required. Electronically Signed   By: Eddie Candle M.D.   On: 12/26/2020 08:01    All questions were answered. The patient knows to call the clinic with any problems, questions or concerns. I spent 45 minutes in the care of this patient including H and P, review of records, counseling and coordination of care.     Benay Pike, MD 01/23/2021 9:28 AM

## 2021-01-24 ENCOUNTER — Other Ambulatory Visit: Payer: Medicare HMO

## 2021-01-24 DIAGNOSIS — I480 Paroxysmal atrial fibrillation: Secondary | ICD-10-CM

## 2021-01-24 DIAGNOSIS — I4819 Other persistent atrial fibrillation: Secondary | ICD-10-CM

## 2021-01-24 DIAGNOSIS — I10 Essential (primary) hypertension: Secondary | ICD-10-CM | POA: Diagnosis not present

## 2021-01-24 LAB — BASIC METABOLIC PANEL
BUN/Creatinine Ratio: 16 (ref 12–28)
BUN: 20 mg/dL (ref 8–27)
CO2: 26 mmol/L (ref 20–29)
Calcium: 9.9 mg/dL (ref 8.7–10.3)
Chloride: 100 mmol/L (ref 96–106)
Creatinine, Ser: 1.27 mg/dL — ABNORMAL HIGH (ref 0.57–1.00)
Glucose: 74 mg/dL (ref 70–99)
Potassium: 3.8 mmol/L (ref 3.5–5.2)
Sodium: 141 mmol/L (ref 134–144)
eGFR: 42 mL/min/{1.73_m2} — ABNORMAL LOW (ref >59)

## 2021-01-24 LAB — FOLATE RBC
Folate, Hemolysate: 440 ng/mL
Folate, RBC: 991 ng/mL (ref >498)
Hematocrit: 44.4 % (ref 34.0–46.6)

## 2021-01-24 LAB — ANTINUCLEAR ANTIBODIES, IFA: ANA Ab, IFA: NEGATIVE

## 2021-01-31 DIAGNOSIS — L821 Other seborrheic keratosis: Secondary | ICD-10-CM | POA: Diagnosis not present

## 2021-01-31 DIAGNOSIS — L820 Inflamed seborrheic keratosis: Secondary | ICD-10-CM | POA: Diagnosis not present

## 2021-01-31 DIAGNOSIS — L649 Androgenic alopecia, unspecified: Secondary | ICD-10-CM | POA: Diagnosis not present

## 2021-02-06 ENCOUNTER — Other Ambulatory Visit: Payer: Self-pay

## 2021-02-06 ENCOUNTER — Inpatient Hospital Stay: Payer: Medicare HMO | Admitting: Hematology and Oncology

## 2021-02-06 ENCOUNTER — Encounter: Payer: Self-pay | Admitting: Hematology and Oncology

## 2021-02-06 VITALS — BP 147/75 | HR 58 | Temp 97.9°F | Resp 16

## 2021-02-06 DIAGNOSIS — D696 Thrombocytopenia, unspecified: Secondary | ICD-10-CM | POA: Diagnosis not present

## 2021-02-06 DIAGNOSIS — M069 Rheumatoid arthritis, unspecified: Secondary | ICD-10-CM | POA: Diagnosis not present

## 2021-02-06 DIAGNOSIS — Z79899 Other long term (current) drug therapy: Secondary | ICD-10-CM | POA: Diagnosis not present

## 2021-02-06 DIAGNOSIS — Z7901 Long term (current) use of anticoagulants: Secondary | ICD-10-CM | POA: Diagnosis not present

## 2021-02-06 DIAGNOSIS — I482 Chronic atrial fibrillation, unspecified: Secondary | ICD-10-CM | POA: Diagnosis not present

## 2021-02-06 NOTE — Progress Notes (Addendum)
[facility] CONSULT NOTE  Patient Care Team: Rankins, Bill Salinas, MD as PCP - General (Family Medicine) Sueanne Margarita, MD as PCP - Cardiology (Cardiology)  CHIEF COMPLAINTS/PURPOSE OF CONSULTATION:  Thrombocytopenia.  ASSESSMENT & PLAN:   This is a very pleasant 86 year old female patient with past medical history significant for rheumatoid arthritis, chronic atrial fibrillation, valvular heart issues referred to hematology for evaluation of thrombocytopenia especially in the setting of concomitant use of blood thinners. During her last visit, we discussed additional blood work and she is here for FU. Today, she feels well, no new complaints We have reviewed labs which showed complete correction of thrombocytopenia, CKD, otherwise no evidence of nutritional deficiencies, hemolysis, hypothyroidism, ANA negative. This could have been transient thrombocytopenia secondary to viral infection which has resolved. Given chronic use of steroids, I have recommended that she consider bone density monitoring for diagnosis and management of osteoporosis. At this time, there is no further indication for work up. She can follow up with hematology as needed. I have discussed increased risk of bleeding with anticoagulation on board and seek immediate care with any uncontrolled bleeding.  HISTORY OF PRESENTING ILLNESS:   Samantha Henderson 86 y.o. female is here because of thrombocytopenia.  Ms Clinkenbeard is here for a follow up. She is doing well today. She denies any complaints. No bleeding issues No change in medications.  Rest of the pertinent 10 point ROS reviewed and negative.  REVIEW OF SYSTEMS:   Constitutional: Denies fevers, chills or abnormal night sweats Eyes: Denies blurriness of vision, double vision or watery eyes Ears, nose, mouth, throat, and face: Denies mucositis or sore throat Respiratory: Denies cough, dyspnea or wheezes Cardiovascular: Denies palpitation, chest discomfort or  lower extremity swelling Gastrointestinal:  Denies nausea, heartburn or change in bowel habits Skin: Denies abnormal skin rashes Lymphatics: Denies new lymphadenopathy or easy bruising Neurological:Denies numbness, tingling or new weaknesses Behavioral/Psych: Mood is stable, no new changes  All other systems were reviewed with the patient and are negative.  MEDICAL HISTORY:  Past Medical History:  Diagnosis Date   Abdominal bruit    Abdominal bruit in the past no aortic aneurysm by evaluation in the past   Aortic regurgitation    Moderate by echo 05/2016   Arm weakness    Arm weakness with exercise, January, 2012   Benign essential HTN 01/27/2017   Bradycardia    Carotid stenosis    1-39% bilateral by dopplers 09/2019   Constipation    Constipation with diltiazem March, 0240   Diastolic dysfunction    Mild   Dizziness    Ejection fraction 2011   55-60% ejection fraction, echo, January, 2012   HTN (hypertension)    Hypothyroidism    Leg cramps    from lipitor in the past   Mitral regurgitation    echo..04/2007  /  mild..04/2010   Mitral regurgitation 11/15/2018   Mild to moderate by echo 09/2018   PAC (premature atrial contraction)    Palpitation    Pericardial effusion    small...posterior...echo..04/2007   Polymyalgia ([place])    Pulmonary hypertension (HCC)    Rheumatic fever    Questionable history of rheumatic fever, but no history of valvular abnormalities   Sinus tachycardia    Temporal arteritis (HCC)    Rule out temporal arteritis in the past   Tricuspid regurgitation    -moderate...echo..04/2007...Marland KitchenMarland KitchenEF  60%..echo..04/2007  /   55-60%....echo..05/21/2010   Varicose veins of both lower extremities  SURGICAL HISTORY: Past Surgical History:  Procedure Laterality Date   BACK SURGERY     BUNIONECTOMY     HIP PINNING,CANNULATED Left 04/12/2016   Procedure: CANNULATED HIP PINNING;  Surgeon: Marchia Bond, MD;  Location: [facility];  Service: Orthopedics;  Laterality: Left;    NECK SURGERY     TOTAL ABDOMINAL HYSTERECTOMY      SOCIAL HISTORY: Social History   Socioeconomic History   Marital status: Widowed    Spouse name: Not on file   Number of children: Not on file   Years of education: Not on file   Highest education level: Not on file  Occupational History   Not on file  Tobacco Use   Smoking status: Never   Smokeless tobacco: Never  Vaping Use   Vaping Use: Never used  Substance and Sexual Activity   Alcohol use: No   Drug use: No   Sexual activity: Not on file  Other Topics Concern   Not on file  Social History Narrative   Not on file   Social Determinants of Health   Financial Resource Strain: Not on file  Food Insecurity: Not on file  Transportation Needs: Not on file  Physical Activity: Not on file  Stress: Not on file  Social Connections: Not on file  Intimate Partner Violence: Not on file    FAMILY HISTORY: Family History  Problem Relation Age of Onset   Heart disease Mother    Stroke Mother     ALLERGIES:  is allergic to niacin and related, amlodipine, cefdinir, celecoxib, codeine, nebivolol hcl, pregabalin, cephalexin, ciprofloxacin, lisinopril, plaquenil [hydroxychloroquine], sulfamethoxazole-trimethoprim, sulfonamide derivatives, and augmentin [amoxicillin-pot clavulanate].  MEDICATIONS:  Current Outpatient Medications  Medication Sig Dispense Refill   Acetaminophen (TYLENOL PO) Take 650 mg by mouth as needed.     apixaban (ELIQUIS) 2.5 MG TABS tablet Take 1 tablet (2.5 mg total) by mouth 2 (two) times daily. 180 tablet 1   atorvastatin (LIPITOR) 10 MG tablet Take by mouth daily.      calcium carbonate (OS-CAL) 1250 (500 Ca) MG chewable tablet Chew by mouth. 1-3 chewables as needed     Cholecalciferol (VITAMIN D3) 125 MCG (5000 UT) CAPS Take 1 capsule by mouth daily.      diltiazem (CARDIZEM CD) 120 MG 24 hr capsule Take 1 capsule (120 mg total) by mouth daily. Please make yearly appt with Dr. Radford Pax for August  2022 for future refills. Thank you 1st attempt 90 capsule 1   diltiazem (CARDIZEM) 30 MG tablet Take 1 tablet (30 mg total) by mouth daily as needed. For breakthrough atrial fibrillation. 90 tablet 3   doxylamine, Sleep, (UNISOM) 25 MG tablet Take 25 mg by mouth at bedtime as needed.     hydrochlorothiazide (MICROZIDE) 12.5 MG capsule Take 1 capsule (12.5 mg total) by mouth daily. 90 capsule 3   IBUPROFEN PO Take 650 mg by mouth every evening.      levothyroxine (SYNTHROID) 88 MCG tablet Take 1 tablet (88 mcg total) by mouth daily. 90 tablet 3   Magnesium Oxide -Mg Supplement 250 MG TABS Take 1 tablet by mouth daily.     meclizine (ANTIVERT) 12.5 MG tablet Take 12.5 mg by mouth 3 (three) times daily as needed.     Melatonin 10 MG TABS Take 10 mg by mouth at bedtime.     ondansetron (ZOFRAN) 4 MG tablet Take 4 mg by mouth 3 (three) times daily as needed.     polyethylene glycol (MIRALAX / GLYCOLAX) packet  Take 17 g by mouth daily.     predniSONE (DELTASONE) 5 MG tablet Take 5 mg by mouth daily.     traMADol (ULTRAM) 50 MG tablet Take 1 tablet (50 mg total) by mouth every 6 (six) hours as needed. 30 tablet 0   Turmeric 500 MG CAPS Take 500 mg by mouth daily.     No current facility-administered medications for this visit.    PHYSICAL EXAMINATION: ECOG PERFORMANCE STATUS: 0 - Asymptomatic  BP (!) 147/75 (BP Location: Left Arm, Patient Position: Sitting)   Pulse (!) 58   Temp 97.9 F (36.6 C) (Tympanic)   Resp 16   SpO2 97%   PE deferred in lieu of counseling.  LABORATORY DATA:  I have reviewed the data as listed Lab Results  Component Value Date   WBC 10.2 01/23/2021   HGB 14.0 01/23/2021   HCT 44.4 01/23/2021   HCT 43.0 01/23/2021   MCV 92.5 01/23/2021   PLT 244 01/23/2021     Chemistry      Component Value Date/Time   NA 141 01/24/2021 0920   K 3.8 01/24/2021 0920   CL 100 01/24/2021 0920   CO2 26 01/24/2021 0920   BUN 20 01/24/2021 0920   CREATININE 1.27 (H)  01/24/2021 0920   CREATININE 1.17 (H) 05/01/2015 1425   GLU 110 04/22/2016 0000      Component Value Date/Time   CALCIUM 9.9 01/24/2021 0920   ALKPHOS 43 01/23/2021 0956   AST 22 01/23/2021 0956   ALT 15 01/23/2021 0956   BILITOT 0.8 01/23/2021 0956     All labs from last visit reviewed. CBC showed white blood cell count of 10,200, hemoglobin of 14, platelet count of 244,000. CMP showed creatinine of 1.4 which is her baseline, no transaminitis. LDH, reticulocyte count normal.  Iron panel, ferritin normal.  E33 high, folic acid levels are normal.  ANA negative, TSH normal.  RADIOGRAPHIC STUDIES: I have personally reviewed the radiological images as listed and agreed with the findings in the report. No results found.  All questions were answered. The patient knows to call the clinic with any problems, questions or concerns. I spent 20 minutes in the care of this patient including History, review of records, counseling and coordination of care.     Benay Pike, MD 02/06/2021 9:18 AM

## 2021-02-18 DIAGNOSIS — E785 Hyperlipidemia, unspecified: Secondary | ICD-10-CM | POA: Diagnosis not present

## 2021-02-18 DIAGNOSIS — Z7901 Long term (current) use of anticoagulants: Secondary | ICD-10-CM | POA: Diagnosis not present

## 2021-02-18 DIAGNOSIS — I480 Paroxysmal atrial fibrillation: Secondary | ICD-10-CM | POA: Diagnosis not present

## 2021-02-18 DIAGNOSIS — E039 Hypothyroidism, unspecified: Secondary | ICD-10-CM | POA: Diagnosis not present

## 2021-02-18 DIAGNOSIS — M8580 Other specified disorders of bone density and structure, unspecified site: Secondary | ICD-10-CM | POA: Diagnosis not present

## 2021-02-18 DIAGNOSIS — I10 Essential (primary) hypertension: Secondary | ICD-10-CM | POA: Diagnosis not present

## 2021-02-18 DIAGNOSIS — Z800 Family history of malignant neoplasm of digestive organs: Secondary | ICD-10-CM | POA: Diagnosis not present

## 2021-02-18 DIAGNOSIS — D696 Thrombocytopenia, unspecified: Secondary | ICD-10-CM | POA: Diagnosis not present

## 2021-02-18 DIAGNOSIS — H9190 Unspecified hearing loss, unspecified ear: Secondary | ICD-10-CM | POA: Diagnosis not present

## 2021-02-18 DIAGNOSIS — M069 Rheumatoid arthritis, unspecified: Secondary | ICD-10-CM | POA: Diagnosis not present

## 2021-02-25 DIAGNOSIS — M810 Age-related osteoporosis without current pathological fracture: Secondary | ICD-10-CM | POA: Diagnosis not present

## 2021-02-25 DIAGNOSIS — M8589 Other specified disorders of bone density and structure, multiple sites: Secondary | ICD-10-CM | POA: Diagnosis not present

## 2021-04-01 DIAGNOSIS — M170 Bilateral primary osteoarthritis of knee: Secondary | ICD-10-CM | POA: Diagnosis not present

## 2021-04-04 DIAGNOSIS — M154 Erosive (osteo)arthritis: Secondary | ICD-10-CM | POA: Diagnosis not present

## 2021-04-04 DIAGNOSIS — Z6820 Body mass index (BMI) 20.0-20.9, adult: Secondary | ICD-10-CM | POA: Diagnosis not present

## 2021-04-04 DIAGNOSIS — M1991 Primary osteoarthritis, unspecified site: Secondary | ICD-10-CM | POA: Diagnosis not present

## 2021-04-04 DIAGNOSIS — M0579 Rheumatoid arthritis with rheumatoid factor of multiple sites without organ or systems involvement: Secondary | ICD-10-CM | POA: Diagnosis not present

## 2021-04-04 DIAGNOSIS — M545 Low back pain, unspecified: Secondary | ICD-10-CM | POA: Diagnosis not present

## 2021-04-17 ENCOUNTER — Other Ambulatory Visit: Payer: Self-pay | Admitting: Gastroenterology

## 2021-04-17 DIAGNOSIS — D696 Thrombocytopenia, unspecified: Secondary | ICD-10-CM

## 2021-04-17 DIAGNOSIS — Z800 Family history of malignant neoplasm of digestive organs: Secondary | ICD-10-CM

## 2021-04-17 DIAGNOSIS — Z1211 Encounter for screening for malignant neoplasm of colon: Secondary | ICD-10-CM

## 2021-04-23 ENCOUNTER — Encounter (INDEPENDENT_AMBULATORY_CARE_PROVIDER_SITE_OTHER): Payer: Medicare HMO | Admitting: Ophthalmology

## 2021-04-25 ENCOUNTER — Encounter (INDEPENDENT_AMBULATORY_CARE_PROVIDER_SITE_OTHER): Payer: Medicare HMO | Admitting: Ophthalmology

## 2021-04-25 ENCOUNTER — Other Ambulatory Visit: Payer: Self-pay

## 2021-04-25 DIAGNOSIS — H35033 Hypertensive retinopathy, bilateral: Secondary | ICD-10-CM | POA: Diagnosis not present

## 2021-04-25 DIAGNOSIS — H33303 Unspecified retinal break, bilateral: Secondary | ICD-10-CM | POA: Diagnosis not present

## 2021-04-25 DIAGNOSIS — I10 Essential (primary) hypertension: Secondary | ICD-10-CM

## 2021-04-25 DIAGNOSIS — H43813 Vitreous degeneration, bilateral: Secondary | ICD-10-CM | POA: Diagnosis not present

## 2021-04-29 DIAGNOSIS — M170 Bilateral primary osteoarthritis of knee: Secondary | ICD-10-CM | POA: Diagnosis not present

## 2021-05-06 DIAGNOSIS — M170 Bilateral primary osteoarthritis of knee: Secondary | ICD-10-CM | POA: Diagnosis not present

## 2021-05-13 DIAGNOSIS — M170 Bilateral primary osteoarthritis of knee: Secondary | ICD-10-CM | POA: Diagnosis not present

## 2021-05-15 ENCOUNTER — Ambulatory Visit
Admission: RE | Admit: 2021-05-15 | Discharge: 2021-05-15 | Disposition: A | Discharge status: Home / Self Care | Payer: Medicare HMO | Source: Ambulatory Visit | Attending: Gastroenterology | Admitting: Gastroenterology

## 2021-05-15 DIAGNOSIS — K5730 Diverticulosis of large intestine without perforation or abscess without bleeding: Secondary | ICD-10-CM | POA: Diagnosis not present

## 2021-05-15 DIAGNOSIS — I700 Atherosclerosis of aorta: Secondary | ICD-10-CM | POA: Diagnosis not present

## 2021-05-15 DIAGNOSIS — N281 Cyst of kidney, acquired: Secondary | ICD-10-CM | POA: Diagnosis not present

## 2021-05-15 DIAGNOSIS — K7689 Other specified diseases of liver: Secondary | ICD-10-CM | POA: Diagnosis not present

## 2021-05-15 DIAGNOSIS — Z1211 Encounter for screening for malignant neoplasm of colon: Secondary | ICD-10-CM

## 2021-05-15 DIAGNOSIS — D696 Thrombocytopenia, unspecified: Secondary | ICD-10-CM

## 2021-05-15 DIAGNOSIS — Z800 Family history of malignant neoplasm of digestive organs: Secondary | ICD-10-CM

## 2021-05-29 DIAGNOSIS — E890 Postprocedural hypothyroidism: Secondary | ICD-10-CM | POA: Diagnosis not present

## 2021-05-29 DIAGNOSIS — E7800 Pure hypercholesterolemia, unspecified: Secondary | ICD-10-CM | POA: Diagnosis not present

## 2021-05-29 DIAGNOSIS — I10 Essential (primary) hypertension: Secondary | ICD-10-CM | POA: Diagnosis not present

## 2021-06-04 ENCOUNTER — Telehealth: Payer: Self-pay

## 2021-06-04 NOTE — Telephone Encounter (Signed)
Spoke to Dr . Radene Ou on 06/04/2021 ~ 10 am    Pt with a Tscore of -2.6 at the right femoral neck   Her Cr. Cl is 27 , so bisphosphonates are contra-indicated and I have recommended Dustin Acres, MD  [facility]  [facility] [address] [place], [place] [address] Phone: [phone] FAX: [phone]

## 2021-06-04 NOTE — Telephone Encounter (Signed)
Dr. Zadie Rhine would like to speak with you for a second opinion on patient treatment for Osteoporosis.   Callback: [phone]

## 2021-06-27 ENCOUNTER — Telehealth: Payer: Self-pay

## 2021-06-27 NOTE — Telephone Encounter (Signed)
Dr Jordan Hawks Rankins called requesting to speak with Dr Chryl Heck regarding pt's platelets. I called Dr Radene Ou back to make her aware Dr Chryl Heck is out of the office until 3/14. Dr Radene Ou provided her personal cell and requests call from Dr Chryl Heck after 3/20 as she is also going on vacation. Note left for Dr Chryl Heck regarding this.  ?

## 2021-07-10 ENCOUNTER — Other Ambulatory Visit: Payer: Self-pay | Admitting: Hematology and Oncology

## 2021-07-10 NOTE — Progress Notes (Signed)
I called Dr Radene Ou, she had some concerns about using prolia with thrombocytopenia. ?Her last platelet count with [place] was normal. So I recommended repeating and manual count since she was thought to have platelet aggregation in the past. ? ?Samantha Henderson  ? ?

## 2021-07-17 ENCOUNTER — Other Ambulatory Visit: Payer: Self-pay

## 2021-07-17 MED ORDER — DILTIAZEM HCL ER COATED BEADS 120 MG PO CP24: 120.0000 mg | ORAL_CAPSULE | Freq: Every day | ORAL | 1 refills | Status: DC

## 2021-07-17 MED ORDER — DILTIAZEM HCL 30 MG PO TABS: 30.0000 mg | ORAL_TABLET | Freq: Every day | ORAL | 1 refills | Status: DC | PRN

## 2021-07-18 DIAGNOSIS — M1991 Primary osteoarthritis, unspecified site: Secondary | ICD-10-CM | POA: Diagnosis not present

## 2021-07-18 DIAGNOSIS — Z6820 Body mass index (BMI) 20.0-20.9, adult: Secondary | ICD-10-CM | POA: Diagnosis not present

## 2021-07-18 DIAGNOSIS — M545 Low back pain, unspecified: Secondary | ICD-10-CM | POA: Diagnosis not present

## 2021-07-18 DIAGNOSIS — M154 Erosive (osteo)arthritis: Secondary | ICD-10-CM | POA: Diagnosis not present

## 2021-07-18 DIAGNOSIS — M810 Age-related osteoporosis without current pathological fracture: Secondary | ICD-10-CM | POA: Diagnosis not present

## 2021-07-18 DIAGNOSIS — M0579 Rheumatoid arthritis with rheumatoid factor of multiple sites without organ or systems involvement: Secondary | ICD-10-CM | POA: Diagnosis not present

## 2021-08-05 ENCOUNTER — Other Ambulatory Visit: Payer: Self-pay | Admitting: *Deleted

## 2021-08-05 DIAGNOSIS — I480 Paroxysmal atrial fibrillation: Secondary | ICD-10-CM

## 2021-08-05 DIAGNOSIS — M170 Bilateral primary osteoarthritis of knee: Secondary | ICD-10-CM | POA: Diagnosis not present

## 2021-08-05 MED ORDER — APIXABAN 2.5 MG PO TABS: 2.5000 mg | ORAL_TABLET | Freq: Two times a day (BID) | ORAL | 1 refills | Status: DC

## 2021-08-05 NOTE — Telephone Encounter (Signed)
Prescription refill request for Eliquis received. ?Indication:Afib ?Last office visit:9/22 ?Scr:1.27 ?Age: 87 ?Weight:60.3 kg ? ?Prescription refilled ? ?

## 2021-09-04 DIAGNOSIS — Z7901 Long term (current) use of anticoagulants: Secondary | ICD-10-CM | POA: Diagnosis not present

## 2021-09-04 DIAGNOSIS — I480 Paroxysmal atrial fibrillation: Secondary | ICD-10-CM | POA: Diagnosis not present

## 2021-09-04 DIAGNOSIS — D696 Thrombocytopenia, unspecified: Secondary | ICD-10-CM | POA: Diagnosis not present

## 2021-09-04 DIAGNOSIS — M069 Rheumatoid arthritis, unspecified: Secondary | ICD-10-CM | POA: Diagnosis not present

## 2021-09-04 DIAGNOSIS — Z0000 Encounter for general adult medical examination without abnormal findings: Secondary | ICD-10-CM | POA: Diagnosis not present

## 2021-09-04 DIAGNOSIS — E039 Hypothyroidism, unspecified: Secondary | ICD-10-CM | POA: Diagnosis not present

## 2021-09-04 DIAGNOSIS — I10 Essential (primary) hypertension: Secondary | ICD-10-CM | POA: Diagnosis not present

## 2021-09-04 DIAGNOSIS — E785 Hyperlipidemia, unspecified: Secondary | ICD-10-CM | POA: Diagnosis not present

## 2021-09-23 DIAGNOSIS — H35033 Hypertensive retinopathy, bilateral: Secondary | ICD-10-CM | POA: Diagnosis not present

## 2021-09-23 DIAGNOSIS — H43813 Vitreous degeneration, bilateral: Secondary | ICD-10-CM | POA: Diagnosis not present

## 2021-09-23 DIAGNOSIS — H401131 Primary open-angle glaucoma, bilateral, mild stage: Secondary | ICD-10-CM | POA: Diagnosis not present

## 2021-09-23 DIAGNOSIS — H33313 Horseshoe tear of retina without detachment, bilateral: Secondary | ICD-10-CM | POA: Diagnosis not present

## 2021-09-23 DIAGNOSIS — H524 Presbyopia: Secondary | ICD-10-CM | POA: Diagnosis not present

## 2021-09-25 DIAGNOSIS — M170 Bilateral primary osteoarthritis of knee: Secondary | ICD-10-CM | POA: Diagnosis not present

## 2021-10-24 DIAGNOSIS — M154 Erosive (osteo)arthritis: Secondary | ICD-10-CM | POA: Diagnosis not present

## 2021-10-24 DIAGNOSIS — M810 Age-related osteoporosis without current pathological fracture: Secondary | ICD-10-CM | POA: Diagnosis not present

## 2021-10-24 DIAGNOSIS — Z6820 Body mass index (BMI) 20.0-20.9, adult: Secondary | ICD-10-CM | POA: Diagnosis not present

## 2021-10-24 DIAGNOSIS — M1991 Primary osteoarthritis, unspecified site: Secondary | ICD-10-CM | POA: Diagnosis not present

## 2021-10-24 DIAGNOSIS — M0579 Rheumatoid arthritis with rheumatoid factor of multiple sites without organ or systems involvement: Secondary | ICD-10-CM | POA: Diagnosis not present

## 2021-10-24 DIAGNOSIS — M545 Low back pain, unspecified: Secondary | ICD-10-CM | POA: Diagnosis not present

## 2021-10-28 DIAGNOSIS — H5203 Hypermetropia, bilateral: Secondary | ICD-10-CM | POA: Diagnosis not present

## 2021-10-28 DIAGNOSIS — H524 Presbyopia: Secondary | ICD-10-CM | POA: Diagnosis not present

## 2021-10-28 DIAGNOSIS — H52209 Unspecified astigmatism, unspecified eye: Secondary | ICD-10-CM | POA: Diagnosis not present

## 2021-10-30 DIAGNOSIS — M170 Bilateral primary osteoarthritis of knee: Secondary | ICD-10-CM | POA: Diagnosis not present

## 2021-10-30 DIAGNOSIS — Z1231 Encounter for screening mammogram for malignant neoplasm of breast: Secondary | ICD-10-CM | POA: Diagnosis not present

## 2021-11-04 DIAGNOSIS — K219 Gastro-esophageal reflux disease without esophagitis: Secondary | ICD-10-CM | POA: Diagnosis not present

## 2021-11-04 DIAGNOSIS — N183 Chronic kidney disease, stage 3 unspecified: Secondary | ICD-10-CM | POA: Diagnosis not present

## 2021-11-04 DIAGNOSIS — I10 Essential (primary) hypertension: Secondary | ICD-10-CM | POA: Diagnosis not present

## 2021-11-04 DIAGNOSIS — E890 Postprocedural hypothyroidism: Secondary | ICD-10-CM | POA: Diagnosis not present

## 2021-11-04 DIAGNOSIS — M8580 Other specified disorders of bone density and structure, unspecified site: Secondary | ICD-10-CM | POA: Diagnosis not present

## 2021-11-04 DIAGNOSIS — M069 Rheumatoid arthritis, unspecified: Secondary | ICD-10-CM | POA: Diagnosis not present

## 2021-11-04 DIAGNOSIS — E7800 Pure hypercholesterolemia, unspecified: Secondary | ICD-10-CM | POA: Diagnosis not present

## 2021-12-02 DIAGNOSIS — M170 Bilateral primary osteoarthritis of knee: Secondary | ICD-10-CM | POA: Diagnosis not present

## 2021-12-11 DIAGNOSIS — M170 Bilateral primary osteoarthritis of knee: Secondary | ICD-10-CM | POA: Diagnosis not present

## 2021-12-25 DIAGNOSIS — M170 Bilateral primary osteoarthritis of knee: Secondary | ICD-10-CM | POA: Diagnosis not present

## 2022-01-01 DIAGNOSIS — M170 Bilateral primary osteoarthritis of knee: Secondary | ICD-10-CM | POA: Diagnosis not present

## 2022-01-01 DIAGNOSIS — M542 Cervicalgia: Secondary | ICD-10-CM | POA: Diagnosis not present

## 2022-01-01 DIAGNOSIS — M5030 Other cervical disc degeneration, unspecified cervical region: Secondary | ICD-10-CM | POA: Diagnosis not present

## 2022-01-01 DIAGNOSIS — M5136 Other intervertebral disc degeneration, lumbar region: Secondary | ICD-10-CM | POA: Diagnosis not present

## 2022-01-01 DIAGNOSIS — M961 Postlaminectomy syndrome, not elsewhere classified: Secondary | ICD-10-CM | POA: Diagnosis not present

## 2022-01-14 NOTE — Progress Notes (Signed)
Cardiology Office Note:    Date:  01/21/2022   ID:  Samantha Henderson, DOB [birth year], MRN [id number]  PCP:  Aretta Nip, MD  [facility] Prov[facility] Cardiologist:  Fransico Him, MD     Referring MD: Aretta Nip, MD   Chief Complaint:  No chief complaint on file.     History of Present Illness:   Samantha Henderson is a 87 y.o. female  with a hx of persistent atrial fibrillation, moderate AR by 2D echocardiogram 06/10/2016, mild MR and HTN. history of arm pain with radiation into her chest  She also was having palpitations.  She underwent nuclear stress test which showed no ischemia and 2D echo showed normal LVF with G2DD and mild MR and AR.  Event monitor showed recurrent PAF with RVR and she was referred to afib clinic. Due to bradycardia at baseline her BB could not be increased and since her PAF was not felt to be impacting QOL AADT was not started.    She saw Dr. Radford Pax 12/2020 and was in NSR.  Patient comes in for yearly f/u. Denies chest pain, dyspnea, dizziness, palpitations. Occasional ankle swelling if on her feet a lot. Her biggest problem is arthritis. They stopped her steroids and pain meds.          Past Medical History:  Diagnosis Date   Abdominal bruit    Abdominal bruit in the past no aortic aneurysm by evaluation in the past   Aortic regurgitation    Moderate by echo 05/2016   Arm weakness    Arm weakness with exercise, January, 2012   Benign essential HTN 01/27/2017   Bradycardia    Carotid stenosis    1-39% bilateral by dopplers 09/2019   Constipation    Constipation with diltiazem March, 2409   Diastolic dysfunction    Mild   Dizziness    Ejection fraction 2011   55-60% ejection fraction, echo, January, 2012   HTN (hypertension)    Hypothyroidism    Leg cramps    from lipitor in the past   Mitral regurgitation    echo..04/2007  /  mild..04/2010   Mitral regurgitation 11/15/2018   Mild to moderate by echo 09/2018   PAC (premature atrial  contraction)    Palpitation    Pericardial effusion    small...posterior...echo..04/2007   Polymyalgia ([place])    Pulmonary hypertension (HCC)    Rheumatic fever    Questionable history of rheumatic fever, but no history of valvular abnormalities   Sinus tachycardia    Temporal arteritis (HCC)    Rule out temporal arteritis in the past   Tricuspid regurgitation    -moderate...echo..04/2007...Marland KitchenMarland KitchenEF  60%..echo..04/2007  /   55-60%....echo..05/21/2010   Varicose veins of both lower extremities    Current Medications: Current Meds  Medication Sig   Acetaminophen (TYLENOL PO) Take 650 mg by mouth as needed.   atorvastatin (LIPITOR) 10 MG tablet Take by mouth daily.    calcium carbonate (OS-CAL) 1250 (500 Ca) MG chewable tablet Chew by mouth. 1-3 chewables as needed   Cholecalciferol (VITAMIN D3) 125 MCG (5000 UT) CAPS Take 1 capsule by mouth daily.    diltiazem (CARDIZEM) 30 MG tablet Take 1 tablet (30 mg total) by mouth daily as needed. For breakthrough atrial fibrillation.   doxylamine, Sleep, (UNISOM) 25 MG tablet Take 25 mg by mouth at bedtime as needed.   IBUPROFEN PO Take 650 mg by mouth every evening.    levothyroxine (SYNTHROID) 88 MCG tablet  Take 1 tablet (88 mcg total) by mouth daily.   Magnesium Oxide -Mg Supplement 250 MG TABS Take 1 tablet by mouth daily.   meclizine (ANTIVERT) 12.5 MG tablet Take 12.5 mg by mouth 3 (three) times daily as needed.   Melatonin 10 MG TABS Take 10 mg by mouth at bedtime.   Omega-3 Fatty Acids (FISH OIL) 875 MG CAPS Take by mouth. 1 daily   ondansetron (ZOFRAN) 4 MG tablet Take 4 mg by mouth 3 (three) times daily as needed.   polyethylene glycol (MIRALAX / GLYCOLAX) packet Take 17 g by mouth daily.   traMADol (ULTRAM) 50 MG tablet Take 50 mg by mouth 3 (three) times daily.   Turmeric 500 MG CAPS Take 500 mg by mouth daily.   [DISCONTINUED] apixaban (ELIQUIS) 2.5 MG TABS tablet Take 1 tablet (2.5 mg total) by mouth 2 (two) times daily.   [DISCONTINUED]  diltiazem (CARDIZEM CD) 120 MG 24 hr capsule Take 1 capsule (120 mg total) by mouth daily.   [DISCONTINUED] hydrochlorothiazide (MICROZIDE) 12.5 MG capsule Take 1 capsule (12.5 mg total) by mouth daily.   [DISCONTINUED] predniSONE (DELTASONE) 5 MG tablet Take 5 mg by mouth daily.   [DISCONTINUED] traMADol (ULTRAM) 50 MG tablet Take 1 tablet (50 mg total) by mouth every 6 (six) hours as needed.    Allergies:   Niacin and related, Amlodipine, Cefdinir, Celecoxib, Codeine, Nebivolol hcl, Pregabalin, Cephalexin, Ciprofloxacin, Lisinopril, Plaquenil [hydroxychloroquine], Sulfamethoxazole-trimethoprim, Sulfonamide derivatives, and Augmentin [amoxicillin-pot clavulanate]   Social History   Tobacco Use   Smoking status: Never   Smokeless tobacco: Never  Vaping Use   Vaping Use: Never used  Substance Use Topics   Alcohol use: No   Drug use: No    Family Hx: The patient's family history includes Heart disease in her mother; Stroke in her mother.  ROS   EKGs/Labs/Other Test Reviewed:    EKG:  EKG is   ordered today.  The ekg ordered today demonstrates sinus bradycardia, PAC IRBBB  Recent Labs: 01/23/2021: ALT 15; Hemoglobin 14.0; Platelets 244; TSH 3.453 01/24/2021: BUN 20; Creatinine, Ser 1.27; Potassium 3.8; Sodium 141   Recent Lipid Panel No results for input(s): "CHOL", "TRIG", "HDL", "VLDL", "LDLCALC", "LDLDIRECT" in the last 8760 hours.   Prior CV Studies: ECHO COMPLETE WO IMAGING ENHANCING AGENT 10/03/2019  Narrative ECHOCARDIOGRAM REPORT    Patient Name:   Samantha Henderson   Date of Exam: 10/03/2019 Medical Rec #:  [id number]     Height:       66.0 in Accession #:    [id number]    Weight:       123.0 lb Date of Birth:  07-24-1934     BSA:          1.627 m Patient Age:    34 years      BP:           137/74 mmHg Patient Gender: F             HR:           57 bpm. Exam Location:  [facility]  Procedure: 2D Echo, Cardiac Doppler and Color Doppler  Indications:     I48.19  History:        Patient has prior history of Echocardiogram examinations, most recent 10/06/2018. Pulmonary HTN, AI, Arrythmias:Atrial Fibrillation and PAC; Risk Factors:Hypertension.  Sonographer:    Coralyn Helling RDCS Referring Phys: [place]   1. Left ventricular ejection fraction, by estimation, is  60 to 65%. The left ventricle has normal function. The left ventricle has no regional wall motion abnormalities. There is mild concentric left ventricular hypertrophy. Left ventricular diastolic parameters are consistent with Grade II diastolic dysfunction (pseudonormalization). 2. Right ventricular systolic function is normal. The right ventricular size is normal. There is normal pulmonary artery systolic pressure. The estimated right ventricular systolic pressure is 01.7 mmHg. 3. Left atrial size was mildly dilated. 4. Right atrial size was moderately dilated. 5. The mitral valve is normal in structure. Mild mitral valve regurgitation. No evidence of mitral stenosis. 6. The aortic valve is tricuspid. Aortic valve regurgitation is mild. Mild aortic valve sclerosis is present, with no evidence of aortic valve stenosis. 7. The inferior vena cava is normal in size with greater than 50% respiratory variability, suggesting right atrial pressure of 3 mmHg. 8. Cystic structure in the liver measuring 5.8 x 5.4cm. Consider dedicated liver US.  FINDINGS Left Ventricle: Left ventricular ejection fraction, by estimation, is 60 to 65%. The left ventricle has normal function. The left ventricle has no regional wall motion abnormalities. The left ventricular internal cavity size was normal in size. There is mild concentric left ventricular hypertrophy. Left ventricular diastolic parameters are consistent with Grade II diastolic dysfunction (pseudonormalization). Normal left ventricular filling pressure.  Right Ventricle: The right ventricular size is normal. No increase in  right ventricular wall thickness. Right ventricular systolic function is normal. There is normal pulmonary artery systolic pressure. The tricuspid regurgitant velocity is 2.61 m/s, and with an assumed right atrial pressure of 3 mmHg, the estimated right ventricular systolic pressure is 49.4 mmHg.  Left Atrium: Left atrial size was mildly dilated.  Right Atrium: Right atrial size was moderately dilated.  Pericardium: Trivial pericardial effusion is present. The pericardial effusion is circumferential.  Mitral Valve: The mitral valve is normal in structure. There is mild thickening of the mitral valve leaflet(s). Normal mobility of the mitral valve leaflets. Mild mitral valve regurgitation. No evidence of mitral valve stenosis.  Tricuspid Valve: The tricuspid valve is normal in structure. Tricuspid valve regurgitation is mild . No evidence of tricuspid stenosis.  Aortic Valve: The aortic valve is tricuspid. Aortic valve regurgitation is mild. Aortic regurgitation PHT measures 701 msec. Mild aortic valve sclerosis is present, with no evidence of aortic valve stenosis.  Pulmonic Valve: The pulmonic valve was normal in structure. Pulmonic valve regurgitation is not visualized. No evidence of pulmonic stenosis.  Aorta: The aortic root is normal in size and structure.  Venous: The inferior vena cava is normal in size with greater than 50% respiratory variability, suggesting right atrial pressure of 3 mmHg.  IAS/Shunts: No atrial level shunt detected by color flow Doppler.   LEFT VENTRICLE PLAX 2D LVIDd:         3.80 cm  Diastology LVIDs:         2.40 cm  LV e' lateral:   6.82 cm/s LV PW:         1.20 cm  LV E/e' lateral: 11.1 LV IVS:        1.10 cm  LV e' medial:    5.44 cm/s LVOT diam:     2.00 cm  LV E/e' medial:  13.9 LV SV:         91 LV SV Index:   56 LVOT Area:     3.14 cm   RIGHT VENTRICLE             IVC RV S prime:     18.40  cm/s  IVC diam: 0.80 cm TAPSE (M-mode): 2.4  cm RVSP:           30.2 mmHg  LEFT ATRIUM             Index       RIGHT ATRIUM           Index LA diam:        4.30 cm 2.64 cm/m  RA Pressure: 3.00 mmHg LA Vol (A2C):   61.0 ml 37.50 ml/m RA Area:     20.10 cm LA Vol (A4C):   56.9 ml 34.98 ml/m RA Volume:   57.80 ml  35.54 ml/m LA Biplane Vol: 60.1 ml 36.95 ml/m AORTIC VALVE LVOT Vmax:   125.00 cm/s LVOT Vmean:  78.500 cm/s LVOT VTI:    0.291 m AI PHT:      701 msec  AORTA Ao Root diam: 3.40 cm Ao Asc diam:  3.20 cm  MV E velocity: 75.42 cm/s  TRICUSPID VALVE MV A velocity: 61.82 cm/s  TR Peak grad:   27.2 mmHg MV E/A ratio:  1.22        TR Vmax:        261.00 cm/s Estimated RAP:  3.00 mmHg RVSP:           30.2 mmHg  SHUNTS Systemic VTI:  0.29 m Systemic Diam: 2.00 cm  Fransico Him MD Electronically signed by Fransico Him MD Signature Date/Time: 10/03/2019/10:45:04 AM    Final         Risk Assessment/Calculations/Metrics:    CHA2DS2-VASc Score = 4   This indicates a 4.8% annual risk of stroke. The patient's score is based upon: CHF History: 0 HTN History: 1 Diabetes History: 0 Stroke History: 0 Vascular Disease History: 0 Age Score: 2 Gender Score: 1             Physical Exam:    VS:  BP 118/64   Pulse (!) 57   Ht 5' 6.5" (1.689 m)   Wt 126 lb 9.6 oz (57.4 kg)   BMI 20.13 kg/m     Wt Readings from Last 3 Encounters:  01/21/22 126 lb 9.6 oz (57.4 kg)  01/16/22 128 lb 3.2 oz (58.2 kg)  01/23/21 133 lb (60.3 kg)    Physical Exam  GEN: Thin, in no acute distress  Neck: no JVD, carotid bruits, or masses Cardiac:RRR; no murmurs, rubs, or gallops  Respiratory:  clear to auscultation bilaterally, normal work of breathing GI: soft, nontender, nondistended, + BS Ext: without cyanosis, clubbing, or edema, Good distal pulses bilaterally Neuro:  Alert and Oriented x 3,  Psych: euthymic mood, full affect       ASSESSMENT & PLAN:   No problem-specific Assessment & Plan notes found for this  encounter.   PAF on Eliquis 2.5 mg bid age and weight, diltiazem. HR controlled, and no bleeding problems on eliquis. Will check labs today  HTN BP well controlled.  Mild MR/AI on echo 2021-asymptomatic and minimal on exam. No repeat echo at this time.  LE edema chronic and stable on HCTZ but last refill was a 25 mg tablet and she can't break it in half. Requesting 12.5 mg through [facility].          Dispo:  No follow-ups on file.   Medication Adjustments/Labs and Tests Ordered: Current medicines are reviewed at length with the patient today.  Concerns regarding medicines are outlined above.  Tests Ordered: Orders Placed This Encounter  Procedures   CBC  Comprehensive metabolic panel   EKG 76-HYWV   Medication Changes: Meds ordered this encounter  Medications   apixaban (ELIQUIS) 2.5 MG TABS tablet    Sig: Take 1 tablet (2.5 mg total) by mouth 2 (two) times daily.    Dispense:  180 tablet    Refill:  1   diltiazem (CARDIZEM CD) 120 MG 24 hr capsule    Sig: Take 1 capsule (120 mg total) by mouth daily.    Dispense:  90 capsule    Refill:  3   hydrochlorothiazide (MICROZIDE) 12.5 MG capsule    Sig: Take 1 capsule (12.5 mg total) by mouth daily.    Dispense:  90 capsule    Refill:  3   Signed, Ermalinda Barrios, PA-C  01/21/2022 9:46 AM    [address], [place], [place]  [address] Phone: [phone]; Fax: [phone]

## 2022-01-16 ENCOUNTER — Encounter: Payer: Self-pay | Admitting: Internal Medicine

## 2022-01-16 ENCOUNTER — Ambulatory Visit: Payer: Medicare HMO | Admitting: Internal Medicine

## 2022-01-16 VITALS — BP 130/70 | HR 62 | Ht 66.5 in | Wt 128.2 lb

## 2022-01-16 DIAGNOSIS — E890 Postprocedural hypothyroidism: Secondary | ICD-10-CM | POA: Diagnosis not present

## 2022-01-16 MED ORDER — LEVOTHYROXINE SODIUM 88 MCG PO TABS: 88.0000 ug | ORAL_TABLET | Freq: Every day | ORAL | 3 refills | Status: DC

## 2022-01-16 NOTE — Patient Instructions (Addendum)
HOLD BIOTIN 2-3 days before any future thyroid test    You are on levothyroxine - which is your thyroid hormone supplement. You MUST take this consistently.  You should take this first thing in the morning on an empty stomach with water. You should not take it with other medications. Wait 22mn to 1hr prior to eating. If you are taking any vitamins - please take these in the evening.   If you miss a dose, please take your missed dose the following day (double the dose for that day). You should have a pill box for ONLY levothyroxine on your bedside table to help you remember to take your medications.

## 2022-01-16 NOTE — Progress Notes (Signed)
Name: Samantha Henderson  MRN/ DOB: [id number], April 20, 1935    Age/ Sex: 87 y.o., female     PCP: Aretta Nip, MD   Reason for Endocrinology Evaluation: Postoperative Hypothyroidism      Initial Endocrinology Clinic Visit: 02/22/2018    PATIENT IDENTIFIER: Samantha Henderson is a 87 y.o., female with a past medical history of HTN, temporal arteritis, osteoporosis and A.Fib. She has followed with [facility] since 02/22/2018 for consultative assistance with management of her Hypothyroidism .   HISTORICAL SUMMARY: . She is S/P surgical hypothyroidism in 1961 due to [place]. She was always on Levothyroxine 112 mcg up until 2019, when her doses have been gradually reduced due to low TS. On her initial visit to our office she was on Levothyroxine 50 mcg daily with symptomatic hypothyroidism. She was on Biotin at the time. We have asked her to hold it for a few days with repeat TFT's consistent with hypothyroidism TSH 15.61 uIU/mL . We started her on Levothyroxine 100 mcg daily and she was asked to hold Biotin for at least 3 days prior to her lab work in the future.    SUBJECTIVE:    Today (01/16/2022):  Samantha Henderson is here for her a follow up on hypothyroidism.     Weight continues to fluctuate  She has chronic constipation , takes miralax regularly.  Denies palpitations  Denies local neck symptoms  She continues with dysphagia with pills only   She did not hold Biotin this time    She continues to follow with Rheumatology for temporal arteritis   HISTORY:  Past Medical History:  Past Medical History:  Diagnosis Date   Abdominal bruit    Abdominal bruit in the past no aortic aneurysm by evaluation in the past   Aortic regurgitation    Moderate by echo 05/2016   Arm weakness    Arm weakness with exercise, January, 2012   Benign essential HTN 01/27/2017   Bradycardia    Carotid stenosis    1-39% bilateral by dopplers 09/2019   Constipation    Constipation with diltiazem  March, 4270   Diastolic dysfunction    Mild   Dizziness    Ejection fraction 2011   55-60% ejection fraction, echo, January, 2012   HTN (hypertension)    Hypothyroidism    Leg cramps    from lipitor in the past   Mitral regurgitation    echo..04/2007  /  mild..04/2010   Mitral regurgitation 11/15/2018   Mild to moderate by echo 09/2018   PAC (premature atrial contraction)    Palpitation    Pericardial effusion    small...posterior...echo..04/2007   Polymyalgia ([place])    Pulmonary hypertension (HCC)    Rheumatic fever    Questionable history of rheumatic fever, but no history of valvular abnormalities   Sinus tachycardia    Temporal arteritis (HCC)    Rule out temporal arteritis in the past   Tricuspid regurgitation    -moderate...echo..04/2007...Marland KitchenMarland KitchenEF  60%..echo..04/2007  /   55-60%....echo..05/21/2010   Varicose veins of both lower extremities    Past Surgical History:  Past Surgical History:  Procedure Laterality Date   BACK SURGERY     BUNIONECTOMY     HIP PINNING,CANNULATED Left 04/12/2016   Procedure: CANNULATED HIP PINNING;  Surgeon: Marchia Bond, MD;  Location: [facility];  Service: Orthopedics;  Laterality: Left;   NECK SURGERY     TOTAL ABDOMINAL HYSTERECTOMY     Social History:  reports that she  has never smoked. She has never used smokeless tobacco. She reports that she does not drink alcohol and does not use drugs. Family History: family history includes Heart disease in her mother; Stroke in her mother.   HOME MEDICATIONS: Levothyroxine 100 mcg daily     OBJECTIVE:   PHYSICAL EXAM: VS: BP 130/70 (BP Location: Left Arm, Patient Position: Sitting, Cuff Size: Normal)   Pulse 62   Ht 5' 6.5" (1.689 m)   Wt 128 lb 3.2 oz (58.2 kg)   SpO2 96%   BMI 20.38 kg/m    EXAM: General: Pt appears well and is in NAD  Neck: General: Supple without adenopathy. Thyroid: No goiter or nodules appreciated.  Lungs: Clear with good BS bilat with no rales, rhonchi, or wheezes   Heart: Auscultation: RRR.  Abdomen: Normoactive bowel sounds, soft, nontender, without masses or organomegaly palpable  Extremities:  BL LE: No pretibial edema normal ROM and strength.  Mental Status: Judgment, insight: Intact Orientation: Oriented to time, place, and person Mood and affect: No depression, anxiety, or agitation     DATA REVIEWED:  Results for Samantha, Henderson (MRN [id number]) as of 01/18/2021 08:01  Ref. Range 01/17/2021 10:04  TSH Latest Ref Range: 0.35 - 5.50 uIU/mL 2.23     ASSESSMENT / PLAN / RECOMMENDATIONS:   Post-Operative Hypothyroidism :  - She is clinically  euthyroid  -  Pt educated extensively on the correct way to take levothyroxine (first thing in the morning with water, 30 minutes before eating or taking other medications). - Pt encouraged to double dose the following day if she were to miss a dose given long half-life of levothyroxine.  - She forgot to hold Biotin 2-3 days ago, so she will return for repeat labs next week   Medications  Continue  Levothyroxine 88 mcg daily     F/U in 1 yr   Signed electronically by: Mack Guise, MD  [facility] Endocrin[facility]  [facility] [address] [place], [place] [address] Phone: [phone] FAX: [phone]      CC: Aretta Nip, [place] [address] Phone: [phone]  Fax: [phone]   Return to Endocrinology clinic as below: Future Appointments  Date Time Provider Media  01/16/2022  9:30 AM Ariannie Penaloza, Melanie Crazier, MD [facility] None  01/21/2022  9:15 AM Imogene Burn, PA-C [facility]  04/23/2022  9:15 AM Hayden Pedro, MD [facility] None

## 2022-01-21 ENCOUNTER — Encounter: Payer: Self-pay | Admitting: Physician Assistant

## 2022-01-21 ENCOUNTER — Ambulatory Visit: Payer: Medicare HMO | Attending: Physician Assistant | Admitting: Physician Assistant

## 2022-01-21 VITALS — BP 118/64 | HR 57 | Ht 66.5 in | Wt 126.6 lb

## 2022-01-21 DIAGNOSIS — R600 Localized edema: Secondary | ICD-10-CM

## 2022-01-21 DIAGNOSIS — I10 Essential (primary) hypertension: Secondary | ICD-10-CM | POA: Diagnosis not present

## 2022-01-21 DIAGNOSIS — I351 Nonrheumatic aortic (valve) insufficiency: Secondary | ICD-10-CM | POA: Diagnosis not present

## 2022-01-21 DIAGNOSIS — I480 Paroxysmal atrial fibrillation: Secondary | ICD-10-CM

## 2022-01-21 DIAGNOSIS — I340 Nonrheumatic mitral (valve) insufficiency: Secondary | ICD-10-CM | POA: Diagnosis not present

## 2022-01-21 MED ORDER — APIXABAN 2.5 MG PO TABS: 2.5000 mg | ORAL_TABLET | Freq: Two times a day (BID) | ORAL | 1 refills | Status: DC

## 2022-01-21 MED ORDER — DILTIAZEM HCL ER COATED BEADS 120 MG PO CP24: 120.0000 mg | ORAL_CAPSULE | Freq: Every day | ORAL | 3 refills | Status: DC

## 2022-01-21 MED ORDER — HYDROCHLOROTHIAZIDE 12.5 MG PO CAPS: 12.5000 mg | ORAL_CAPSULE | Freq: Every day | ORAL | 3 refills | Status: DC

## 2022-01-21 NOTE — Patient Instructions (Signed)
Medication Instructions:  Your physician recommends that you continue on your current medications as directed. Please refer to the Current Medication list given to you today.  *If you need a refill on your cardiac medications before your next appointment, please call your pharmacy*   Lab Work: TODAY: CBC, CMET  If you have labs (blood work) drawn today and your tests are completely normal, you will receive your results only by: [place] (if you have MyChart) OR A paper copy in the mail If you have any lab test that is abnormal or we need to change your treatment, we will call you to review the results.   Testing/Procedures: None ordered today   Follow-Up: At [facility], you and your health needs are our priority.  As part of our continuing mission to provide you with exceptional heart care, we have created designated Provider Care Teams.  These Care Teams include your primary Cardiologist (physician) and Advanced Practice Providers (APPs -  Physician Assistants and Nurse Practitioners) who all work together to provide you with the care you need, when you need it.  We recommend signing up for the patient portal called "MyChart".  Sign up information is provided on this After Visit Summary.  MyChart is used to connect with patients for Virtual Visits (Telemedicine).  Patients are able to view lab/test results, encounter notes, upcoming appointments, etc.  Non-urgent messages can be sent to your provider as well.   To learn more about what you can do with MyChart, go to [email or web address].    Your next appointment:   6 month(s)  The format for your next appointment:   In Person  Provider:   Fransico Him, MD    Important Information About Sugar

## 2022-01-22 LAB — COMPREHENSIVE METABOLIC PANEL
ALT: 11 IU/L (ref 0–32)
AST: 20 IU/L (ref 0–40)
Albumin/Globulin Ratio: 2 (ref 1.2–2.2)
Albumin: 4.3 g/dL (ref 3.7–4.7)
Alkaline Phosphatase: 45 IU/L (ref 44–121)
BUN/Creatinine Ratio: 14 (ref 12–28)
BUN: 17 mg/dL (ref 8–27)
Bilirubin Total: 0.5 mg/dL (ref 0.0–1.2)
CO2: 25 mmol/L (ref 20–29)
Calcium: 9.9 mg/dL (ref 8.7–10.3)
Chloride: 99 mmol/L (ref 96–106)
Creatinine, Ser: 1.23 mg/dL — ABNORMAL HIGH (ref 0.57–1.00)
Globulin, Total: 2.1 g/dL (ref 1.5–4.5)
Glucose: 81 mg/dL (ref 70–99)
Potassium: 4.1 mmol/L (ref 3.5–5.2)
Sodium: 141 mmol/L (ref 134–144)
Total Protein: 6.4 g/dL (ref 6.0–8.5)
eGFR: 43 mL/min/{1.73_m2} — ABNORMAL LOW (ref >59)

## 2022-01-22 LAB — CBC
Hematocrit: 41.7 % (ref 34.0–46.6)
Hemoglobin: 13.5 g/dL (ref 11.1–15.9)
MCH: 29.7 pg (ref 26.6–33.0)
MCHC: 32.4 g/dL (ref 31.5–35.7)
MCV: 92 fL (ref 79–97)
RBC: 4.54 x10E6/uL (ref 3.77–5.28)
RDW: 12.2 % (ref 11.7–15.4)
WBC: 5.5 10*3/uL (ref 3.4–10.8)

## 2022-01-23 ENCOUNTER — Other Ambulatory Visit (INDEPENDENT_AMBULATORY_CARE_PROVIDER_SITE_OTHER): Payer: Medicare HMO

## 2022-01-23 DIAGNOSIS — E890 Postprocedural hypothyroidism: Secondary | ICD-10-CM

## 2022-01-23 LAB — TSH: TSH: 8.68 u[IU]/mL — ABNORMAL HIGH (ref 0.35–5.50)

## 2022-01-24 ENCOUNTER — Telehealth: Payer: Self-pay | Admitting: Internal Medicine

## 2022-01-24 DIAGNOSIS — E890 Postprocedural hypothyroidism: Secondary | ICD-10-CM

## 2022-01-24 MED ORDER — LEVOTHYROXINE SODIUM 100 MCG PO TABS: 100.0000 ug | ORAL_TABLET | Freq: Every day | ORAL | 3 refills | Status: DC

## 2022-01-24 NOTE — Telephone Encounter (Signed)
Please let the patient know that her thyroid test shows that she is not on enough levothyroxine.   Please stop levothyroxine 88 mcg   Start levothyroxine 100 mcg   Schedule her for a lab appointment in 3 months Make sure she stops taking the biotin 2 to 3 days before her next thyroid check

## 2022-01-29 DIAGNOSIS — M170 Bilateral primary osteoarthritis of knee: Secondary | ICD-10-CM | POA: Diagnosis not present

## 2022-02-03 ENCOUNTER — Encounter: Payer: Self-pay | Admitting: Physical Medicine and Rehabilitation

## 2022-02-17 ENCOUNTER — Encounter: Payer: Medicare HMO | Admitting: Physical Medicine and Rehabilitation

## 2022-03-05 DIAGNOSIS — M170 Bilateral primary osteoarthritis of knee: Secondary | ICD-10-CM | POA: Diagnosis not present

## 2022-03-25 ENCOUNTER — Telehealth: Payer: Self-pay

## 2022-03-25 NOTE — Telephone Encounter (Signed)
Patient with diagnosis of afib on Eliquis for anticoagulation.    Procedure: RIGHT TOTAL KNEE REPLACEMENT  Date of procedure: TBD   CHA2DS2-VASc Score = 4   This indicates a 4.8% annual risk of stroke. The patient's score is based upon: CHF History: 0 HTN History: 1 Diabetes History: 0 Stroke History: 0 Vascular Disease History: 0 Age Score: 2 Gender Score: 1      CrCl 30 ml/min  Per office protocol, patient can hold Eliquis for 3 days prior to procedure.    **This guidance is not considered finalized until pre-operative APP has relayed final recommendations.**

## 2022-03-25 NOTE — Telephone Encounter (Signed)
Will route to pharm then will need tele given >1 month since last OV in 01/2022, doing well at that time.

## 2022-03-25 NOTE — Telephone Encounter (Signed)
   Name: Samantha Henderson  DOB: 05-14-1934  MRN: [id number]  Primary Cardiologist: Fransico Him, MD Last ov 01/21/22, >1 month ago  Preoperative team, please contact this patient and set up a phone call appointment for further preoperative risk assessment. Please obtain consent and complete medication review. Thank you for your help.  I confirm that guidance regarding antiplatelet and oral anticoagulation therapy has been completed and, if necessary, noted below.   Charlie Pitter, PA-C 03/25/2022, 5:12 PM [facility]

## 2022-03-25 NOTE — Telephone Encounter (Signed)
....     Pre-operative Risk Assessment    Patient Name: Samantha Henderson  DOB: 1934/09/10 MRN: [id number]      Request for Surgical Clearance    Procedure:   RIGHT TOTAL KNEE REPLACEMENT  Date of Surgery:  Clearance TBD                                 Surgeon:  SR JOSHUA LANDAU Surgeon's Group or Practice Name:  [facility] Phone number:  [phone] Fax number:  [phone]   Type of Clearance Requested:   - Medical  - Pharmacy:  Hold Apixaban (Eliquis)     Type of Anesthesia:  Spinal   Additional requests/questions:    [name]   03/25/2022, 2:07 PM

## 2022-03-26 ENCOUNTER — Telehealth: Payer: Self-pay | Admitting: *Deleted

## 2022-03-26 NOTE — Telephone Encounter (Signed)
Pt has been scheduled for tele pre op appt 03/27/22 @ 2 pm. Med rec and consent are done.

## 2022-03-26 NOTE — Telephone Encounter (Signed)
Pt has been scheduled for tele pre op appt 03/27/22 @ 2 pm. Med rec and consent are done.     Patient Consent for Virtual Visit        Samantha Henderson has provided verbal consent on 03/26/2022 for a virtual visit (video or telephone).   CONSENT FOR VIRTUAL VISIT FOR:  Samantha Henderson  By participating in this virtual visit I agree to the following:  I hereby voluntarily request, consent and authorize [facility] and its employed or contracted physicians, physician assistants, nurse practitioners or other licensed health care professionals (the Practitioner), to provide me with telemedicine health care services (the "Services") as deemed necessary by the treating Practitioner. I acknowledge and consent to receive the Services by the Practitioner via telemedicine. I understand that the telemedicine visit will involve communicating with the Practitioner through live audiovisual communication technology and the disclosure of certain medical information by electronic transmission. I acknowledge that I have been given the opportunity to request an in-person assessment or other available alternative prior to the telemedicine visit and am voluntarily participating in the telemedicine visit.  I understand that I have the right to withhold or withdraw my consent to the use of telemedicine in the course of my care at any time, without affecting my right to future care or treatment, and that the Practitioner or I may terminate the telemedicine visit at any time. I understand that I have the right to inspect all information obtained and/or recorded in the course of the telemedicine visit and may receive copies of available information for a reasonable fee.  I understand that some of the potential risks of receiving the Services via telemedicine include:  Delay or interruption in medical evaluation due to technological equipment failure or disruption; Information transmitted may not be sufficient (e.g. poor  resolution of images) to allow for appropriate medical decision making by the Practitioner; and/or  In rare instances, security protocols could fail, causing a breach of personal health information.  Furthermore, I acknowledge that it is my responsibility to provide information about my medical history, conditions and care that is complete and accurate to the best of my ability. I acknowledge that Practitioner's advice, recommendations, and/or decision may be based on factors not within their control, such as incomplete or inaccurate data provided by me or distortions of diagnostic images or specimens that may result from electronic transmissions. I understand that the practice of medicine is not an exact science and that Practitioner makes no warranties or guarantees regarding treatment outcomes. I acknowledge that a copy of this consent can be made available to me via my patient portal ([facility]), or I can request a printed copy by calling the office of [facility].    I understand that my insurance will be billed for this visit.   I have read or had this consent read to me. I understand the contents of this consent, which adequately explains the benefits and risks of the Services being provided via telemedicine.  I have been provided ample opportunity to ask questions regarding this consent and the Services and have had my questions answered to my satisfaction. I give my informed consent for the services to be provided through the use of telemedicine in my medical care

## 2022-03-27 ENCOUNTER — Ambulatory Visit: Payer: Medicare HMO | Attending: Cardiovascular Disease | Admitting: Nurse Practitioner

## 2022-03-27 DIAGNOSIS — Z01810 Encounter for preprocedural cardiovascular examination: Secondary | ICD-10-CM | POA: Diagnosis not present

## 2022-03-27 DIAGNOSIS — Z6820 Body mass index (BMI) 20.0-20.9, adult: Secondary | ICD-10-CM | POA: Diagnosis not present

## 2022-03-27 DIAGNOSIS — Z01818 Encounter for other preprocedural examination: Secondary | ICD-10-CM | POA: Diagnosis not present

## 2022-03-27 NOTE — Progress Notes (Signed)
Virtual Visit via Telephone Note   Because of [place] Sarinana Kehl's co-morbid illnesses, she is at least at moderate risk for complications without adequate follow up.  This format is felt to be most appropriate for this patient at this time.  The patient did not have access to video technology/had technical difficulties with video requiring transitioning to audio format only (telephone).  All issues noted in this document were discussed and addressed.  No physical exam could be performed with this format.  Please refer to the patient's chart for her consent to telehealth for [facility].  Evaluation Performed:  Preoperative cardiovascular risk assessment _____________   Date:  03/27/2022   Patient ID:  Samantha Henderson, DOB [birth year], MRN [id number] Patient Location:  Home Provider location:   Office  Primary Care Provider:  Aretta Nip, MD Primary Cardiologist:  Fransico Him, MD  Chief Complaint / Patient Profile   87 y.o. y/o female with a h/o stent atrial fibrillation, moderate aortic valve regurgitation, mild mitral valve regurgitation, hypertension, palpitations, and chronic bilateral lower extremity edema who is pending R total knee replacement with Dr. Marchia Bond of [facility] and presents today for telephonic preoperative cardiovascular risk assessment.  History of Present Illness    Samantha Henderson is a 87 y.o. female who presents via audio/video conferencing for a telehealth visit today.  Pt was last seen in cardiology clinic on 01/21/2022 by Estella Husk, PA.  At that time [name] was doing well.  The patient is now pending procedure as outlined above. Since her last visit, she has been stable from a cardiac standpoint.  Her mobility is overall limited in the setting of orthopedic issues.  Pt states she is concerned that she will not have anyone to help her at home after surgery.  I advised her to discuss this with her surgeon.  She denies chest pain,  palpitations, dyspnea, pnd, orthopnea, n, v, dizziness, syncope, edema, weight gain, or early satiety. All other systems reviewed and are otherwise negative except as noted above.   Past Medical History    Past Medical History:  Diagnosis Date   Abdominal bruit    Abdominal bruit in the past no aortic aneurysm by evaluation in the past   Aortic regurgitation    Moderate by echo 05/2016   Arm weakness    Arm weakness with exercise, January, 2012   Benign essential HTN 01/27/2017   Bradycardia    Carotid stenosis    1-39% bilateral by dopplers 09/2019   Constipation    Constipation with diltiazem March, 3244   Diastolic dysfunction    Mild   Dizziness    Ejection fraction 2011   55-60% ejection fraction, echo, January, 2012   HTN (hypertension)    Hypothyroidism    Leg cramps    from lipitor in the past   Mitral regurgitation    echo..04/2007  /  mild..04/2010   Mitral regurgitation 11/15/2018   Mild to moderate by echo 09/2018   PAC (premature atrial contraction)    Palpitation    Pericardial effusion    small...posterior...echo..04/2007   Polymyalgia ([place])    Pulmonary hypertension (HCC)    Rheumatic fever    Questionable history of rheumatic fever, but no history of valvular abnormalities   Sinus tachycardia    Temporal arteritis (HCC)    Rule out temporal arteritis in the past   Tricuspid regurgitation    -moderate...echo..04/2007...Marland KitchenMarland KitchenEF  60%..echo..04/2007  /   55-60%....echo..05/21/2010  Varicose veins of both lower extremities    Past Surgical History:  Procedure Laterality Date   BACK SURGERY     BUNIONECTOMY     HIP PINNING,CANNULATED Left 04/12/2016   Procedure: CANNULATED HIP PINNING;  Surgeon: Marchia Bond, MD;  Location: [facility];  Service: Orthopedics;  Laterality: Left;   NECK SURGERY     TOTAL ABDOMINAL HYSTERECTOMY      Allergies  Allergies  Allergen Reactions   Niacin And Related Palpitations   Amlodipine Swelling   Cefdinir Swelling   Celecoxib Rash    Codeine Nausea And Vomiting   Nebivolol Hcl Swelling   Pregabalin Swelling   Cephalexin     Rash   Ciprofloxacin Swelling    Hives  Tongue Sweeling   Lisinopril     Cougfh   Plaquenil [Hydroxychloroquine] Other (See Comments)    Low platelets   Sulfamethoxazole-Trimethoprim     Rash   Sulfonamide Derivatives     Rash   Augmentin [Amoxicillin-Pot Clavulanate] Rash    Home Medications    Prior to Admission medications   Medication Sig Start Date End Date Taking? Authorizing Provider  Acetaminophen (TYLENOL PO) Take 650 mg by mouth as needed.    [provider]  apixaban (ELIQUIS) 2.5 MG TABS tablet Take 1 tablet (2.5 mg total) by mouth 2 (two) times daily. 01/21/22   Imogene Burn, PA-C  atorvastatin (LIPITOR) 10 MG tablet Take by mouth daily.     [provider]  calcium carbonate (OS-CAL) 1250 (500 Ca) MG chewable tablet Chew by mouth. 1-3 chewables as needed    [provider]  Cholecalciferol (VITAMIN D3) 125 MCG (5000 UT) CAPS Take 1 capsule by mouth daily.     [provider]  diltiazem (CARDIZEM CD) 120 MG 24 hr capsule Take 1 capsule (120 mg total) by mouth daily. 01/21/22   Imogene Burn, PA-C  diltiazem (CARDIZEM) 30 MG tablet Take 1 tablet (30 mg total) by mouth daily as needed. For breakthrough atrial fibrillation. 07/17/21   Sueanne Margarita, MD  doxylamine, Sleep, (UNISOM) 25 MG tablet Take 25 mg by mouth at bedtime as needed.    [provider]  hydrochlorothiazide (MICROZIDE) 12.5 MG capsule Take 1 capsule (12.5 mg total) by mouth daily. 01/21/22   Imogene Burn, PA-C  IBUPROFEN PO Take 650 mg by mouth every evening.     [provider]  levothyroxine (SYNTHROID) 100 MCG tablet Take 1 tablet (100 mcg total) by mouth daily. 01/24/22   Shamleffer, Melanie Crazier, MD  Magnesium Oxide -Mg Supplement 250 MG TABS Take 1 tablet by mouth daily. 04/26/19   [provider]  meclizine (ANTIVERT) 12.5 MG tablet  Take 12.5 mg by mouth 3 (three) times daily as needed. 12/05/20   [provider]  Melatonin 10 MG TABS Take 10 mg by mouth at bedtime.    [provider]  Omega-3 Fatty Acids (FISH OIL) 875 MG CAPS Take by mouth. 1 daily    [provider]  ondansetron (ZOFRAN) 4 MG tablet Take 4 mg by mouth 3 (three) times daily as needed. 12/05/20   [provider]  polyethylene glycol (MIRALAX / GLYCOLAX) packet Take 17 g by mouth daily.    [provider]  traMADol (ULTRAM) 50 MG tablet Take 50 mg by mouth 3 (three) times daily. 01/03/21   [provider]  Turmeric 500 MG CAPS Take 500 mg by mouth daily.    [provider]  Physical Exam    Vital Signs:  ELSBETH YEARICK does not have vital signs available for review today.  Given telephonic nature of communication, physical exam is limited. AAOx3. NAD. Normal affect.  Speech and respirations are unlabored.  Accessory Clinical Findings    None  Assessment & Plan    1.  Preoperative Cardiovascular Risk Assessment:  According to the Revised Cardiac Risk Index (RCRI), her Perioperative Risk of Major Cardiac Event is (%): 0.4. Her Functional Capacity in METs is: 4.06 according to the Duke Activity Status Index (DASI). Therefore, based on ACC/AHA guidelines, patient would be at acceptable risk for the planned procedure without further cardiovascular testing.   The patient was advised that if she develops new symptoms prior to surgery to contact our office to arrange for a follow-up visit, and she verbalized understanding.  Per office protocol, patient can hold Eliquis for 3 days prior to procedure.   Please resume Eliquis as soon as possible postprocedure, at the discretion of the surgeon.   A copy of this note will be routed to requesting surgeon.  Time:   Today, I have spent 6 minutes with the patient with telehealth technology discussing medical history, symptoms, and management plan.      Lenna Sciara, NP  03/27/2022, 2:09 PM

## 2022-04-01 DIAGNOSIS — H401131 Primary open-angle glaucoma, bilateral, mild stage: Secondary | ICD-10-CM | POA: Diagnosis not present

## 2022-04-03 DIAGNOSIS — H401121 Primary open-angle glaucoma, left eye, mild stage: Secondary | ICD-10-CM | POA: Diagnosis not present

## 2022-04-18 DIAGNOSIS — H401111 Primary open-angle glaucoma, right eye, mild stage: Secondary | ICD-10-CM | POA: Diagnosis not present

## 2022-04-23 ENCOUNTER — Encounter (INDEPENDENT_AMBULATORY_CARE_PROVIDER_SITE_OTHER): Payer: Medicare HMO | Admitting: Ophthalmology

## 2022-04-23 DIAGNOSIS — H35033 Hypertensive retinopathy, bilateral: Secondary | ICD-10-CM

## 2022-04-23 DIAGNOSIS — H43813 Vitreous degeneration, bilateral: Secondary | ICD-10-CM

## 2022-04-23 DIAGNOSIS — I10 Essential (primary) hypertension: Secondary | ICD-10-CM

## 2022-04-23 DIAGNOSIS — H33303 Unspecified retinal break, bilateral: Secondary | ICD-10-CM | POA: Diagnosis not present

## 2022-04-28 NOTE — Care Plan (Signed)
Ortho Bundle Case Management Note  Patient Details  Name: Samantha Henderson MRN: [id number] Date of Birth: [birth year]   Patient will discharge to home with limited assist from friends.  HHPT referral to [facility] and oPPT set up with [place] walker ordered from [place]. Will be seen in the office prior to surgery to confirm assistance. Patient and MD in agreement. CHoice offered.                   DME Arranged:  [place] rolling DME Agency:  [employer]  HH Arranged:    [place] Agency:  [facility]  Additional Comments: Please contact me with any questions of if this plan should need to change.  Ladell Heads,  [facility]  [phone] 04/28/2022, 12:07 PM

## 2022-04-28 NOTE — Progress Notes (Signed)
Sent message, via epic in basket, requesting order in epic from surgeon    04/28/22 1347  Preop Orders  Has preop orders? No  Name of staff/physician contacted for orders(Indicate phone or IB message) Merlene Pulling, PA-C.

## 2022-04-30 DIAGNOSIS — M1711 Unilateral primary osteoarthritis, right knee: Secondary | ICD-10-CM | POA: Diagnosis not present

## 2022-05-01 ENCOUNTER — Other Ambulatory Visit (INDEPENDENT_AMBULATORY_CARE_PROVIDER_SITE_OTHER): Payer: Medicare HMO

## 2022-05-01 DIAGNOSIS — E890 Postprocedural hypothyroidism: Secondary | ICD-10-CM | POA: Diagnosis not present

## 2022-05-01 LAB — TSH: TSH: 3 u[IU]/mL (ref 0.35–5.50)

## 2022-05-02 ENCOUNTER — Encounter: Payer: Self-pay | Admitting: Internal Medicine

## 2022-05-02 NOTE — Patient Instructions (Signed)
DUE TO COVID-19 ONLY TWO VISITORS  (aged 86 and older)  ARE ALLOWED TO COME WITH YOU AND STAY IN THE WAITING ROOM ONLY DURING PRE OP AND PROCEDURE.   **NO VISITORS ARE ALLOWED IN THE SHORT STAY AREA OR RECOVERY ROOM!!**  IF YOU WILL BE ADMITTED INTO THE HOSPITAL YOU ARE ALLOWED ONLY FOUR SUPPORT PEOPLE DURING VISITATION HOURS ONLY (7 AM -8PM)   The support person(s) must pass our screening, gel in and out, and wear a mask at all times, including in the patient's room. Patients must also wear a mask when staff or their support person are in the room. Visitors GUEST BADGE MUST BE WORN VISIBLY  One adult visitor may remain with you overnight and MUST be in the room by 8 P.M.     Your procedure is scheduled on: 05/13/22   Report to [facility] Main Entrance    Report to admitting at : 11:00 AM   Call this number if you have problems the morning of surgery [phone]   Do not eat food :After Midnight.   After Midnight you may have the following liquids until : 10:30 AM DAY OF SURGERY  Water Black Coffee (sugar ok, NO MILK/CREAM OR CREAMERS)  Tea (sugar ok, NO MILK/CREAM OR CREAMERS) regular and decaf                             Plain Jell-O (NO RED)                                           Fruit ices (not with fruit pulp, NO RED)                                     Popsicles (NO RED)                                                                  Juice: apple, WHITE grape, WHITE cranberry Sports drinks like Gatorade (NO RED)   The day of surgery:  Drink ONE (1) Pre-Surgery Clear Ensure or G2 at : 10:30 AM the morning of surgery. Drink in one sitting. Do not sip.  This drink was given to you during your hospital  pre-op appointment visit. Nothing else to drink after completing the  Pre-Surgery Clear Ensure or G2.          If you have questions, please contact your surgeon's office    Oral Hygiene is also important to reduce your risk of infection.                                     Remember - BRUSH YOUR TEETH THE MORNING OF SURGERY WITH YOUR REGULAR TOOTHPASTE  DENTURES WILL BE REMOVED PRIOR TO SURGERY PLEASE DO NOT APPLY "Poly grip" OR ADHESIVES!!!   Do NOT smoke after Midnight   Take these medicines the morning of surgery with A SIP OF WATER: diltiazem,levothyroxine.Tylenol,meclizine,ondansetron as needed.  You may not have any metal on your body including hair pins, jewelry, and body piercing             Do not wear make-up, lotions, powders, perfumes/cologne, or deodorant  Do not wear nail polish including gel and S&S, artificial/acrylic nails, or any other type of covering on natural nails including finger and toenails. If you have artificial nails, gel coating, etc. that needs to be removed by a nail salon please have this removed prior to surgery or surgery may need to be canceled/ delayed if the surgeon/ anesthesia feels like they are unable to be safely monitored.   Do not shave  48 hours prior to surgery.    Do not bring valuables to the hospital. [place].   Contacts, glasses, or bridgework may not be worn into surgery.   Bring small overnight bag day of surgery.   DO NOT [place]. PHARMACY WILL DISPENSE MEDICATIONS LISTED ON YOUR MEDICATION LIST TO YOU DURING YOUR ADMISSION [place]!    Patients discharged on the day of surgery will not be allowed to drive home.  Someone NEEDS to stay with you for the first 24 hours after anesthesia.   Special Instructions: Bring a copy of your healthcare power of attorney and living will documents         the day of surgery if you haven't scanned them before.              Please read over the following fact sheets you were given: IF YOU HAVE QUESTIONS ABOUT YOUR PRE-OP INSTRUCTIONS PLEASE CALL [phone]    [facility] - Preparing for Surgery Before surgery, you can play an important  role.  Because skin is not sterile, your skin needs to be as free of germs as possible.  You can reduce the number of germs on your skin by washing with CHG (chlorahexidine gluconate) soap before surgery.  CHG is an antiseptic cleaner which kills germs and bonds with the skin to continue killing germs even after washing. Please DO NOT use if you have an allergy to CHG or antibacterial soaps.  If your skin becomes reddened/irritated stop using the CHG and inform your nurse when you arrive at Short Stay. Do not shave (including legs and underarms) for at least 48 hours prior to the first CHG shower.  You may shave your face/neck. Please follow these instructions carefully:  1.  Shower with CHG Soap the night before surgery and the  morning of Surgery.  2.  If you choose to wash your hair, wash your hair first as usual with your  normal  shampoo.  3.  After you shampoo, rinse your hair and body thoroughly to remove the  shampoo.                           4.  Use CHG as you would any other liquid soap.  You can apply chg directly  to the skin and wash                       Gently with a scrungie or clean washcloth.  5.  Apply the CHG Soap to your body ONLY FROM THE NECK DOWN.   Do not use on face/ open  Wound or open sores. Avoid contact with eyes, ears mouth and genitals (private parts).                       Wash face,  Genitals (private parts) with your normal soap.             6.  Wash thoroughly, paying special attention to the area where your surgery  will be performed.  7.  Thoroughly rinse your body with warm water from the neck down.  8.  DO NOT shower/wash with your normal soap after using and rinsing off  the CHG Soap.                9.  Pat yourself dry with a clean towel.            10.  Wear clean pajamas.            11.  Place clean sheets on your bed the night of your first shower and do not  sleep with pets. Day of Surgery : Do not apply any lotions/deodorants  the morning of surgery.  Please wear clean clothes to the hospital/surgery center.  FAILURE TO FOLLOW THESE INSTRUCTIONS MAY RESULT IN THE CANCELLATION OF YOUR SURGERY PATIENT SIGNATURE_________________________________  NURSE SIGNATURE__________________________________  ________________________________________________________________________  Samantha Henderson  An incentive spirometer is a tool that can help keep your lungs clear and active. This tool measures how well you are filling your lungs with each breath. Taking long deep breaths may help reverse or decrease the chance of developing breathing (pulmonary) problems (especially infection) following: A long period of time when you are unable to move or be active. BEFORE THE PROCEDURE  If the spirometer includes an indicator to show your best effort, your nurse or respiratory therapist will set it to a desired goal. If possible, sit up straight or lean slightly forward. Try not to slouch. Hold the incentive spirometer in an upright position. INSTRUCTIONS FOR USE  Sit on the edge of your bed if possible, or sit up as far as you can in bed or on a chair. Hold the incentive spirometer in an upright position. Breathe out normally. Place the mouthpiece in your mouth and seal your lips tightly around it. Breathe in slowly and as deeply as possible, raising the piston or the ball toward the top of the column. Hold your breath for 3-5 seconds or for as long as possible. Allow the piston or ball to fall to the bottom of the column. Remove the mouthpiece from your mouth and breathe out normally. Rest for a few seconds and repeat Steps 1 through 7 at least 10 times every 1-2 hours when you are awake. Take your time and take a few normal breaths between deep breaths. The spirometer may include an indicator to show your best effort. Use the indicator as a goal to work toward during each repetition. After each set of 10 deep breaths, practice  coughing to be sure your lungs are clear. If you have an incision (the cut made at the time of surgery), support your incision when coughing by placing a pillow or rolled up towels firmly against it. Once you are able to get out of bed, walk around indoors and cough well. You may stop using the incentive spirometer when instructed by your caregiver.  RISKS AND COMPLICATIONS Take your time so you do not get dizzy or light-headed. If you are in pain, you may need to take or ask  for pain medication before doing incentive spirometry. It is harder to take a deep breath if you are having pain. AFTER USE Rest and breathe slowly and easily. It can be helpful to keep track of a log of your progress. Your caregiver can provide you with a simple table to help with this. If you are using the spirometer at home, follow these instructions: [place] IF:  You are having difficultly using the spirometer. You have trouble using the spirometer as often as instructed. Your pain medication is not giving enough relief while using the spirometer. You develop fever of 100.5 F (38.1 C) or higher. SEEK IMMEDIATE MEDICAL CARE IF:  You cough up bloody sputum that had not been present before. You develop fever of 102 F (38.9 C) or greater. You develop worsening pain at or near the incision site. MAKE SURE YOU:  Understand these instructions. Will watch your condition. Will get help right away if you are not doing well or get worse. Document Released: 08/18/2006 Document Revised: 06/30/2011 Document Reviewed: 10/19/2006 [facility] Patient Information 2014 [place], [place].   ________________________________________________________________________

## 2022-05-05 ENCOUNTER — Encounter (HOSPITAL_COMMUNITY): Payer: Self-pay

## 2022-05-05 ENCOUNTER — Other Ambulatory Visit: Payer: Medicare HMO

## 2022-05-05 ENCOUNTER — Encounter (HOSPITAL_COMMUNITY)
Admission: RE | Admit: 2022-05-05 | Discharge: 2022-05-05 | Disposition: A | Discharge status: Home / Self Care | Payer: Medicare HMO | Source: Ambulatory Visit | Attending: Orthopedic Surgery | Admitting: Orthopedic Surgery

## 2022-05-05 ENCOUNTER — Other Ambulatory Visit: Payer: Self-pay

## 2022-05-05 VITALS — BP 126/67 | HR 67 | Temp 98.0°F | Ht 66.5 in | Wt 121.0 lb

## 2022-05-05 DIAGNOSIS — R001 Bradycardia, unspecified: Secondary | ICD-10-CM | POA: Diagnosis not present

## 2022-05-05 DIAGNOSIS — Z01812 Encounter for preprocedural laboratory examination: Secondary | ICD-10-CM | POA: Diagnosis present

## 2022-05-05 DIAGNOSIS — I10 Essential (primary) hypertension: Secondary | ICD-10-CM | POA: Diagnosis not present

## 2022-05-05 DIAGNOSIS — Z01818 Encounter for other preprocedural examination: Secondary | ICD-10-CM

## 2022-05-05 HISTORY — DX: Unspecified osteoarthritis, unspecified site: M19.90

## 2022-05-05 HISTORY — DX: Cardiac arrhythmia, unspecified: I49.9

## 2022-05-05 LAB — BASIC METABOLIC PANEL
Anion gap: 8 (ref 5–15)
BUN: 25 mg/dL — ABNORMAL HIGH (ref 8–23)
CO2: 26 mmol/L (ref 22–32)
Calcium: 9.4 mg/dL (ref 8.9–10.3)
Chloride: 102 mmol/L (ref 98–111)
Creatinine, Ser: 1.11 mg/dL — ABNORMAL HIGH (ref 0.44–1.00)
GFR, Estimated: 49 mL/min — ABNORMAL LOW (ref >60)
Glucose, Bld: 83 mg/dL (ref 70–99)
Potassium: 4 mmol/L (ref 3.5–5.1)
Sodium: 136 mmol/L (ref 135–145)

## 2022-05-05 LAB — CBC
HCT: 41.7 % (ref 36.0–46.0)
Hemoglobin: 13.1 g/dL (ref 12.0–15.0)
MCH: 28.9 pg (ref 26.0–34.0)
MCHC: 31.4 g/dL (ref 30.0–36.0)
MCV: 92.1 fL (ref 80.0–100.0)
Platelets: UNDETERMINED 10*3/uL (ref 150–400)
RBC: 4.53 MIL/uL (ref 3.87–5.11)
RDW: 12.3 % (ref 11.5–15.5)
WBC: 6.3 10*3/uL (ref 4.0–10.5)
nRBC: 0 % (ref 0.0–0.2)

## 2022-05-05 LAB — SURGICAL PCR SCREEN
MRSA, PCR: POSITIVE — AB
Staphylococcus aureus: POSITIVE — AB

## 2022-05-05 NOTE — Progress Notes (Signed)
PCR: + MRSA./+ STAPH 

## 2022-05-05 NOTE — Progress Notes (Signed)
For Short Stay: [place] appointment date:  Bowel Prep reminder:   For Anesthesia: PCP - Dr. Milagros Evener Cardiologist - Dr. Fransico Him Clearance: Diona Browner: NP: 03/27/22 Chest x-ray -  EKG - 01/21/22 Stress Test -  ECHO - 10/03/19 Cardiac Cath -  Pacemaker/ICD device last checked: Pacemaker orders received: Device Rep notified:  Spinal Cord Stimulator:  Sleep Study - N/A CPAP -   Fasting Blood Sugar -  Checks Blood Sugar _____ times a day Date and result of last Hgb A1c-  Last dose of GLP1 agonist-  GLP1 instructions:   Last dose of SGLT-2 inhibitors-  SGLT-2 instructions:   Blood Thinner Instructions: Eliquis: Will be held 3 days before surgery: Dr. Mardelle Matte Aspirin Instructions: Last Dose:  Activity level: Can go up a flight of stairs and activities of daily living without stopping and without chest pain and/or shortness of breath   Able to exercise without chest pain and/or shortness of breath  Anesthesia review: Hx: HTN,PAC,Sinus Tach,CAD.  Patient denies shortness of breath, fever, cough and chest pain at PAT appointment   Patient verbalized understanding of instructions that were given to them at the PAT appointment. Patient was also instructed that they will need to review over the PAT instructions again at home before surgery.

## 2022-05-07 DIAGNOSIS — M5136 Other intervertebral disc degeneration, lumbar region: Secondary | ICD-10-CM | POA: Diagnosis not present

## 2022-05-07 DIAGNOSIS — M961 Postlaminectomy syndrome, not elsewhere classified: Secondary | ICD-10-CM | POA: Diagnosis not present

## 2022-05-07 DIAGNOSIS — Z79891 Long term (current) use of opiate analgesic: Secondary | ICD-10-CM | POA: Diagnosis not present

## 2022-05-07 DIAGNOSIS — M5030 Other cervical disc degeneration, unspecified cervical region: Secondary | ICD-10-CM | POA: Diagnosis not present

## 2022-05-07 NOTE — Progress Notes (Addendum)
Anesthesia Chart Review   Case: [id number] Date/Time: 05/13/22 1323   Procedure: TOTAL KNEE ARTHROPLASTY (Right: Knee)   Anesthesia type: Spinal   Pre-op diagnosis: DJD RIGHT KNEE   Location: [facility]   Surgeons: Marchia Bond, MD       DISCUSSION:88 y.o. never smoker with h/o a-fib, moderate aortic valve regurgitation, mild mitral valve regurgitation, HTN, right knee djd scheduled for above procedure 05/13/2022 with Dr. Marchia Bond.   Pt last seen by cardiology 03/27/2022. Per OV note, "According to the Revised Cardiac Risk Index (RCRI), her Perioperative Risk of Major Cardiac Event is (%): 0.4. Her Functional Capacity in METs is: 4.06 according to the Duke Activity Status Index (DASI). Therefore, based on ACC/AHA guidelines, patient would be at acceptable risk for the planned procedure without further cardiovascular testing.    The patient was advised that if she develops new symptoms prior to surgery to contact our office to arrange for a follow-up visit, and she verbalized understanding.  Per office protocol, patient can hold Eliquis for 3 days prior to procedure.   Please resume Eliquis as soon as possible postprocedure, at the discretion of the surgeon. "  Last dose AM of 05/10/21.  Attempted to reach patient to confirm on multiple occasions.  VS: BP 126/67   Pulse 67   Temp 36.7 C (Oral)   Ht 5' 6.5" (1.689 m)   Wt 54.9 kg   SpO2 100%   BMI 19.24 kg/m   PROVIDERS: Marda Stalker, PA-C is PCP   Cardiologist - Dr. Fransico Him  LABS: Labs reviewed: Acceptable for surgery. (all labs ordered are listed, but only abnormal results are displayed)  Labs Reviewed  SURGICAL PCR SCREEN - Abnormal; Notable for the following components:      Result Value   MRSA, PCR POSITIVE (*)    Staphylococcus aureus POSITIVE (*)    All other components within normal limits  BASIC METABOLIC PANEL - Abnormal; Notable for the following components:   BUN 25 (*)    Creatinine, Ser  1.11 (*)    GFR, Estimated 49 (*)    All other components within normal limits  CBC     IMAGES:   EKG:   CV: Myocardial Perfusion 06/06/2019 The left ventricular ejection fraction is hyperdynamic (>65%). Nuclear stress EF: 66%. There was no ST segment deviation noted during stress. The study is normal. This is a low risk study.   Normal resting and stress perfusion. No ischemia or infarction EF 66%   Echo 10/03/2019 1. Left ventricular ejection fraction, by estimation, is 60 to 65%. The  left ventricle has normal function. The left ventricle has no regional  wall motion abnormalities. There is mild concentric left ventricular  hypertrophy. Left ventricular diastolic  parameters are consistent with Grade II diastolic dysfunction  (pseudonormalization).   2. Right ventricular systolic function is normal. The right ventricular  size is normal. There is normal pulmonary artery systolic pressure. The  estimated right ventricular systolic pressure is 00.1 mmHg.   3. Left atrial size was mildly dilated.   4. Right atrial size was moderately dilated.   5. The mitral valve is normal in structure. Mild mitral valve  regurgitation. No evidence of mitral stenosis.   6. The aortic valve is tricuspid. Aortic valve regurgitation is mild.  Mild aortic valve sclerosis is present, with no evidence of aortic valve  stenosis.   7. The inferior vena cava is normal in size with greater than 50%  respiratory  variability, suggesting right atrial pressure of 3 mmHg.   8. Cystic structure in the liver measuring 5.8 x 5.4cm. Consider  dedicated liver US.  Past Medical History:  Diagnosis Date   Abdominal bruit    Abdominal bruit in the past no aortic aneurysm by evaluation in the past   Aortic regurgitation    Moderate by echo 05/2016   Arm weakness    Arm weakness with exercise, January, 2012   Arthritis    Benign essential HTN 01/27/2017   Bradycardia    Carotid stenosis    1-39%  bilateral by dopplers 09/2019   Constipation    Constipation with diltiazem March, 5643   Diastolic dysfunction    Mild   Dizziness    Dysrhythmia    Ejection fraction 2011   55-60% ejection fraction, echo, January, 2012   HTN (hypertension)    Hypothyroidism    Leg cramps    from lipitor in the past   Mitral regurgitation    echo..04/2007  /  mild..04/2010   Mitral regurgitation 11/15/2018   Mild to moderate by echo 09/2018   PAC (premature atrial contraction)    Palpitation    Pericardial effusion    small...posterior...echo..04/2007   Polymyalgia ([place])    Pulmonary hypertension (HCC)    Rheumatic fever    Questionable history of rheumatic fever, but no history of valvular abnormalities   Sinus tachycardia    Temporal arteritis (HCC)    Rule out temporal arteritis in the past   Tricuspid regurgitation    -moderate...echo..04/2007...Marland KitchenMarland KitchenEF  60%..echo..04/2007  /   55-60%....echo..05/21/2010   Varicose veins of both lower extremities     Past Surgical History:  Procedure Laterality Date   BACK SURGERY     BUNIONECTOMY     HIP PINNING,CANNULATED Left 04/12/2016   Procedure: CANNULATED HIP PINNING;  Surgeon: Marchia Bond, MD;  Location: [place];  Service: Orthopedics;  Laterality: Left;   NECK SURGERY     TONSILLECTOMY     TOTAL ABDOMINAL HYSTERECTOMY      MEDICATIONS:  acetaminophen (TYLENOL) 650 MG CR tablet   apixaban (ELIQUIS) 2.5 MG TABS tablet   atorvastatin (LIPITOR) 10 MG tablet   Biotin 10 MG CAPS   calcium carbonate (TUMS EX) 750 MG chewable tablet   Cholecalciferol (VITAMIN D3) 125 MCG (5000 UT) CAPS   diltiazem (CARDIZEM CD) 120 MG 24 hr capsule   diltiazem (CARDIZEM) 30 MG tablet   diphenhydrAMINE (BENADRYL) 50 MG tablet   hydrochlorothiazide (MICROZIDE) 12.5 MG capsule   levothyroxine (SYNTHROID) 100 MCG tablet   Magnesium 250 MG TABS   meclizine (ANTIVERT) 12.5 MG tablet   Melatonin 10 MG TABS   Omega-3 Fatty Acids (FISH OIL) 1000 MG CAPS   ondansetron  (ZOFRAN) 4 MG tablet   polyethylene glycol (MIRALAX / GLYCOLAX) packet   traMADol (ULTRAM) 50 MG tablet   traZODone (DESYREL) 50 MG tablet   TURMERIC PO   No current facility-administered medications for this encounter.    Konrad Felix Ward, PA-C [facility] Pre-Surgical Testing [phone]

## 2022-05-12 DIAGNOSIS — M1711 Unilateral primary osteoarthritis, right knee: Secondary | ICD-10-CM | POA: Diagnosis not present

## 2022-05-12 DIAGNOSIS — R262 Difficulty in walking, not elsewhere classified: Secondary | ICD-10-CM | POA: Diagnosis not present

## 2022-05-12 NOTE — H&P (Signed)
KNEE ARTHROPLASTY ADMISSION H&P  Patient ID: DENNETTE Henderson MRN: [id number] DOB/AGE: 1934-07-03 88 y.o.  Chief Complaint: right knee pain.  Planned Procedure Date: 05/13/22 Medical Clearance by Milagros Evener, MD   Cardiac Clearance by Gracy Bruins, NP   HPI: Samantha Henderson is a 88 y.o. female who presents for evaluation of DJD RIGHT KNEE. The patient has a history of pain and functional disability in the right knee due to arthritis and has failed non-surgical conservative treatments for greater than 12 weeks to include NSAID's and/or analgesics, corticosteriod injections, viscosupplementation injections, and activity modification.  Onset of symptoms was gradual, starting 2 years ago with gradually worsening course since that time. The patient noted no past surgery on the right knee.  Patient currently rates pain at 6 out of 10 with activity. Patient has worsening of pain with activity and weight bearing and pain that interferes with activities of daily living.  Patient has evidence of joint space narrowing by imaging studies.  There is no active infection.  Past Medical History:  Diagnosis Date   Abdominal bruit    Abdominal bruit in the past no aortic aneurysm by evaluation in the past   Aortic regurgitation    Moderate by echo 05/2016   Arm weakness    Arm weakness with exercise, January, 2012   Arthritis    Benign essential HTN 01/27/2017   Bradycardia    Carotid stenosis    1-39% bilateral by dopplers 09/2019   Constipation    Constipation with diltiazem March, 5102   Diastolic dysfunction    Mild   Dizziness    Dysrhythmia    Ejection fraction 2011   55-60% ejection fraction, echo, January, 2012   HTN (hypertension)    Hypothyroidism    Leg cramps    from lipitor in the past   Mitral regurgitation    echo..04/2007  /  mild..04/2010   Mitral regurgitation 11/15/2018   Mild to moderate by echo 09/2018   PAC (premature atrial contraction)    Palpitation    Pericardial effusion     small...posterior...echo..04/2007   Polymyalgia ([place])    Pulmonary hypertension (HCC)    Rheumatic fever    Questionable history of rheumatic fever, but no history of valvular abnormalities   Sinus tachycardia    Temporal arteritis (HCC)    Rule out temporal arteritis in the past   Tricuspid regurgitation    -moderate...echo..04/2007...Marland KitchenMarland KitchenEF  60%..echo..04/2007  /   55-60%....echo..05/21/2010   Varicose veins of both lower extremities    Past Surgical History:  Procedure Laterality Date   BACK SURGERY     BUNIONECTOMY     HIP PINNING,CANNULATED Left 04/12/2016   Procedure: CANNULATED HIP PINNING;  Surgeon: Marchia Bond, MD;  Location: [place];  Service: Orthopedics;  Laterality: Left;   NECK SURGERY     TONSILLECTOMY     TOTAL ABDOMINAL HYSTERECTOMY     Allergies  Allergen Reactions   Niacin And Related Palpitations   Amlodipine Swelling   Cefdinir Swelling   Celecoxib Rash   Codeine Nausea And Vomiting   Nebivolol Hcl Swelling   Pregabalin Swelling   Ciprofloxacin Swelling    Hives  Tongue Sweeling   Lisinopril Cough   Plaquenil [Hydroxychloroquine] Other (See Comments)    Low platelets   Sulfamethoxazole-Trimethoprim     Rash   Augmentin [Amoxicillin-Pot Clavulanate] Rash   Cephalexin Rash   Sulfonamide Derivatives Rash    Rash   Prior to Admission medications   Medication Sig Start  Date End Date Taking? Authorizing Provider  acetaminophen (TYLENOL) 650 MG CR tablet Take 650 mg by mouth every 8 (eight) hours as needed for pain.   Yes [provider]  apixaban (ELIQUIS) 2.5 MG TABS tablet Take 1 tablet (2.5 mg total) by mouth 2 (two) times daily. 01/21/22  Yes Imogene Burn, PA-C  atorvastatin (LIPITOR) 10 MG tablet Take by mouth daily.    Yes [provider]  Biotin 10 MG CAPS Take 10 mg by mouth daily.   Yes [provider]  calcium carbonate (TUMS EX) 750 MG chewable tablet Chew 1 tablet by mouth daily as needed for heartburn.   Yes  [provider]  Cholecalciferol (VITAMIN D3) 125 MCG (5000 UT) CAPS Take 1 capsule by mouth daily.    Yes [provider]  diltiazem (CARDIZEM CD) 120 MG 24 hr capsule Take 1 capsule (120 mg total) by mouth daily. 01/21/22  Yes Imogene Burn, PA-C  diltiazem (CARDIZEM) 30 MG tablet Take 1 tablet (30 mg total) by mouth daily as needed. For breakthrough atrial fibrillation. 07/17/21  Yes Turner, Eber Hong, MD  diphenhydrAMINE (BENADRYL) 50 MG tablet Take 50 mg by mouth at bedtime.   Yes [provider]  hydrochlorothiazide (MICROZIDE) 12.5 MG capsule Take 1 capsule (12.5 mg total) by mouth daily. 01/21/22  Yes Imogene Burn, PA-C  levothyroxine (SYNTHROID) 100 MCG tablet Take 1 tablet (100 mcg total) by mouth daily. 01/24/22  Yes Shamleffer, Melanie Crazier, MD  Magnesium 250 MG TABS Take 250 mg by mouth daily.   Yes [provider]  meclizine (ANTIVERT) 12.5 MG tablet Take 12.5 mg by mouth 3 (three) times daily as needed. 12/05/20  Yes [provider]  Melatonin 10 MG TABS Take 10 mg by mouth at bedtime.   Yes [provider]  Omega-3 Fatty Acids (FISH OIL) 1000 MG CAPS Take 1,000 mg by mouth daily.   Yes [provider]  ondansetron (ZOFRAN) 4 MG tablet Take 4 mg by mouth every 8 (eight) hours as needed for nausea or vomiting.   Yes [provider]  polyethylene glycol (MIRALAX / GLYCOLAX) packet Take 17 g by mouth daily.   Yes [provider]  traMADol (ULTRAM) 50 MG tablet Take 50-100 mg by mouth See admin instructions. Take 50 mg in the morning, 50 mg at lunch, and 100 mg at bedtime 01/03/21  Yes [provider]  traZODone (DESYREL) 50 MG tablet Take 12.5 mg by mouth at bedtime as needed for sleep.   Yes [provider]  TURMERIC PO Take 1,000 mg by mouth daily.   Yes [provider]   Social History   Socioeconomic History   Marital status: Widowed    Spouse name: Not on file   Number  of children: Not on file   Years of education: Not on file   Highest education level: Not on file  Occupational History   Not on file  Tobacco Use   Smoking status: Never   Smokeless tobacco: Never  Vaping Use   Vaping Use: Never used  Substance and Sexual Activity   Alcohol use: No   Drug use: No   Sexual activity: Not on file  Other Topics Concern   Not on file  Social History Narrative   Not on file   Social Determinants of Health   Financial Resource Strain: Not on file  Food Insecurity: Not on file  Transportation Needs: Not on file  Physical Activity: Not  on file  Stress: Not on file  Social Connections: Not on file   Family History  Problem Relation Age of Onset   Heart disease Mother    Stroke Mother     ROS: Currently denies lightheadedness, dizziness, Fever, chills, CP, SOB.   No personal history of DVT, PE, MI, or CVA. No loose teeth or dentures All other systems have been reviewed and were otherwise currently negative with the exception of those mentioned in the HPI and as above.  Objective: Vitals: Height: 5?6?Marland Kitchen  Weight: 123.4 pounds.  Blood pressure: 145/66.  O2 SAT: 95% on room air.  Temperature: 97.8.  Pulse: 62.   Physical Exam: General: Alert, NAD.  Antalgic Gait  HEENT: EOMI, Good Neck Extension  Pulm: No increased work of breathing.  Clear B/L A/P w/o crackle or wheeze.  CV: RRR, No m/g/r appreciated  GI: soft, NT, ND Neuro: Neuro without gross focal deficit.  Sensation intact distally Skin: No lesions in the area of chief complaint MSK/Surgical Site: right knee w/o redness or effusion.  She has about 30-110 degrees of range of motion of the right knee today.  Positive lateral joint line tenderness.  Positive crepitus.  Positive valgus alignment.  EHL and FHL are intact.  Distal sensation intact.     Imaging Review Plain radiographs demonstrate severe degenerative joint disease of the right knee.   The overall alignment ismild valgus. The  bone quality appears to be adequate for age and reported activity level.  Preoperative templating of the joint replacement has been completed, documented, and submitted to the Operating Room personnel in order to optimize intra-operative equipment management.  Assessment: DJD RIGHT KNEE    Plan: Plan for Procedure(s): TOTAL KNEE ARTHROPLASTY  The patient history, physical exam, clinical judgement of the provider and imaging are consistent with end stage degenerative joint disease and total joint arthroplasty is deemed medically necessary. The treatment options including medical management, injection therapy, and arthroplasty were discussed at length. The risks and benefits of Procedure(s): TOTAL KNEE ARTHROPLASTY were presented and reviewed.  The risks of nonoperative treatment, versus surgical intervention including but not limited to continued pain, aseptic loosening, stiffness, dislocation/subluxation, infection, bleeding, nerve injury, blood clots, cardiopulmonary complications, morbidity, mortality, among others were discussed. The patient verbalizes understanding and wishes to proceed with the plan.  Patient is being admitted for inpatient treatment for surgery, pain control, PT, prophylactic antibiotics, VTE prophylaxis, progressive ambulation, ADL's and discharge planning.   Dental prophylaxis discussed and recommended for 2 years postoperatively  The patient does meet the criteria for TXA which will be used perioperatively.   Home Eliquis will be used postoperatively for DVT prophylaxis in addition to SCDs, and early ambulation. The patient is planning to be discharged home with HHPT in care of daughter family   Jola Baptist 05/12/2022 3:08 PM

## 2022-05-13 ENCOUNTER — Other Ambulatory Visit: Payer: Self-pay

## 2022-05-13 ENCOUNTER — Observation Stay (HOSPITAL_COMMUNITY): Payer: Medicare HMO

## 2022-05-13 ENCOUNTER — Ambulatory Visit (HOSPITAL_BASED_OUTPATIENT_CLINIC_OR_DEPARTMENT_OTHER): Payer: Medicare HMO | Admitting: Anesthesiology

## 2022-05-13 ENCOUNTER — Ambulatory Visit (HOSPITAL_COMMUNITY): Payer: Medicare HMO | Admitting: Physician Assistant

## 2022-05-13 ENCOUNTER — Encounter (HOSPITAL_COMMUNITY): Payer: Self-pay | Admitting: Orthopedic Surgery

## 2022-05-13 ENCOUNTER — Inpatient Hospital Stay (HOSPITAL_COMMUNITY)
Admission: RE | Admit: 2022-05-13 | Discharge: 2022-05-18 | DRG: 470 | Disposition: A | Discharge status: Home Health | Payer: Medicare HMO | Attending: Orthopedic Surgery | Admitting: Orthopedic Surgery

## 2022-05-13 ENCOUNTER — Encounter (HOSPITAL_COMMUNITY)
Admission: RE | Disposition: A | Discharge status: Home Health | Payer: Self-pay | Source: Home / Self Care | Attending: Orthopedic Surgery

## 2022-05-13 DIAGNOSIS — M1711 Unilateral primary osteoarthritis, right knee: Principal | ICD-10-CM | POA: Diagnosis present

## 2022-05-13 DIAGNOSIS — I10 Essential (primary) hypertension: Secondary | ICD-10-CM | POA: Diagnosis not present

## 2022-05-13 DIAGNOSIS — I4891 Unspecified atrial fibrillation: Secondary | ICD-10-CM

## 2022-05-13 DIAGNOSIS — Z882 Allergy status to sulfonamides status: Secondary | ICD-10-CM

## 2022-05-13 DIAGNOSIS — I083 Combined rheumatic disorders of mitral, aortic and tricuspid valves: Secondary | ICD-10-CM | POA: Diagnosis present

## 2022-05-13 DIAGNOSIS — E039 Hypothyroidism, unspecified: Secondary | ICD-10-CM | POA: Diagnosis present

## 2022-05-13 DIAGNOSIS — I739 Peripheral vascular disease, unspecified: Secondary | ICD-10-CM | POA: Diagnosis not present

## 2022-05-13 DIAGNOSIS — Z90710 Acquired absence of both cervix and uterus: Secondary | ICD-10-CM

## 2022-05-13 DIAGNOSIS — Z7901 Long term (current) use of anticoagulants: Secondary | ICD-10-CM

## 2022-05-13 DIAGNOSIS — G8918 Other acute postprocedural pain: Secondary | ICD-10-CM | POA: Diagnosis not present

## 2022-05-13 DIAGNOSIS — Z79891 Long term (current) use of opiate analgesic: Secondary | ICD-10-CM

## 2022-05-13 DIAGNOSIS — Z881 Allergy status to other antibiotic agents status: Secondary | ICD-10-CM

## 2022-05-13 DIAGNOSIS — Z885 Allergy status to narcotic agent status: Secondary | ICD-10-CM

## 2022-05-13 DIAGNOSIS — I272 Pulmonary hypertension, unspecified: Secondary | ICD-10-CM | POA: Diagnosis present

## 2022-05-13 DIAGNOSIS — Z823 Family history of stroke: Secondary | ICD-10-CM

## 2022-05-13 DIAGNOSIS — Z471 Aftercare following joint replacement surgery: Secondary | ICD-10-CM | POA: Diagnosis not present

## 2022-05-13 DIAGNOSIS — Z79890 Hormone replacement therapy (postmenopausal): Secondary | ICD-10-CM

## 2022-05-13 DIAGNOSIS — Z8249 Family history of ischemic heart disease and other diseases of the circulatory system: Secondary | ICD-10-CM

## 2022-05-13 DIAGNOSIS — K5900 Constipation, unspecified: Secondary | ICD-10-CM | POA: Diagnosis not present

## 2022-05-13 DIAGNOSIS — Z888 Allergy status to other drugs, medicaments and biological substances status: Secondary | ICD-10-CM

## 2022-05-13 DIAGNOSIS — M353 Polymyalgia rheumatica: Secondary | ICD-10-CM | POA: Diagnosis present

## 2022-05-13 DIAGNOSIS — Z96651 Presence of right artificial knee joint: Secondary | ICD-10-CM

## 2022-05-13 DIAGNOSIS — R262 Difficulty in walking, not elsewhere classified: Secondary | ICD-10-CM | POA: Diagnosis present

## 2022-05-13 DIAGNOSIS — Z79899 Other long term (current) drug therapy: Secondary | ICD-10-CM

## 2022-05-13 HISTORY — PX: TOTAL KNEE ARTHROPLASTY: SHX125

## 2022-05-13 SURGERY — ARTHROPLASTY, KNEE, TOTAL
Anesthesia: Spinal | Site: Knee | Laterality: Right

## 2022-05-13 MED ORDER — BUPIVACAINE HCL (PF) 0.25 % IJ SOLN
INTRAMUSCULAR | Status: AC
  Filled 2022-05-13: qty 30

## 2022-05-13 MED ORDER — MAGNESIUM CITRATE PO SOLN: 1.0000 | Freq: Once | ORAL | Status: DC | PRN

## 2022-05-13 MED ORDER — KETOROLAC TROMETHAMINE 30 MG/ML IJ SOLN
INTRAMUSCULAR | Status: AC
  Filled 2022-05-13: qty 1

## 2022-05-13 MED ORDER — METHOCARBAMOL 500 MG PO TABS
500.0000 mg | ORAL_TABLET | Freq: Four times a day (QID) | ORAL | Status: DC | PRN
  Administered 2022-05-13: 500 mg via ORAL
  Filled 2022-05-13: qty 1

## 2022-05-13 MED ORDER — BUPIVACAINE HCL (PF) 0.5 % IJ SOLN
INTRAMUSCULAR | Status: DC | PRN
  Administered 2022-05-13: 15 mL via PERINEURAL

## 2022-05-13 MED ORDER — PHENYLEPHRINE HCL (PRESSORS) 10 MG/ML IV SOLN
INTRAVENOUS | Status: AC
  Filled 2022-05-13: qty 1

## 2022-05-13 MED ORDER — ONDANSETRON HCL 4 MG PO TABS: 4.0000 mg | ORAL_TABLET | Freq: Four times a day (QID) | ORAL | Status: DC | PRN

## 2022-05-13 MED ORDER — HYDROCODONE-ACETAMINOPHEN 7.5-325 MG PO TABS
1.0000 | ORAL_TABLET | ORAL | Status: DC | PRN
  Administered 2022-05-13 – 2022-05-14 (×2): 2 via ORAL
  Filled 2022-05-13 (×2): qty 2

## 2022-05-13 MED ORDER — DILTIAZEM HCL ER COATED BEADS 120 MG PO CP24
120.0000 mg | ORAL_CAPSULE | Freq: Every day | ORAL | Status: DC
  Administered 2022-05-14 – 2022-05-18 (×5): 120 mg via ORAL
  Filled 2022-05-13 (×5): qty 1

## 2022-05-13 MED ORDER — SENNA 8.6 MG PO TABS
1.0000 | ORAL_TABLET | Freq: Two times a day (BID) | ORAL | Status: DC
  Administered 2022-05-13 – 2022-05-17 (×7): 8.6 mg via ORAL
  Filled 2022-05-13 (×10): qty 1

## 2022-05-13 MED ORDER — 0.9 % SODIUM CHLORIDE (POUR BTL) OPTIME
TOPICAL | Status: DC | PRN
  Administered 2022-05-13: 1000 mL

## 2022-05-13 MED ORDER — LIDOCAINE HCL (PF) 2 % IJ SOLN
INTRAMUSCULAR | Status: DC | PRN
Start: 2022-05-13 — End: 2022-05-13
  Administered 2022-05-13: 50 mg via INTRADERMAL

## 2022-05-13 MED ORDER — BUPIVACAINE IN DEXTROSE 0.75-8.25 % IT SOLN
INTRATHECAL | Status: DC | PRN
Start: 2022-05-13 — End: 2022-05-13
  Administered 2022-05-13: 1.7 mL via INTRATHECAL

## 2022-05-13 MED ORDER — ATORVASTATIN CALCIUM 10 MG PO TABS
10.0000 mg | ORAL_TABLET | Freq: Every day | ORAL | Status: DC
  Administered 2022-05-13 – 2022-05-18 (×6): 10 mg via ORAL
  Filled 2022-05-13 (×6): qty 1

## 2022-05-13 MED ORDER — TRANEXAMIC ACID-NACL 1000-0.7 MG/100ML-% IV SOLN
1000.0000 mg | Freq: Once | INTRAVENOUS | Status: AC
  Administered 2022-05-13: 1000 mg via INTRAVENOUS
  Filled 2022-05-13: qty 100

## 2022-05-13 MED ORDER — BUPIVACAINE LIPOSOME 1.3 % IJ SUSP
INTRAMUSCULAR | Status: DC | PRN
  Administered 2022-05-13: 10 mL via PERINEURAL

## 2022-05-13 MED ORDER — DOCUSATE SODIUM 100 MG PO CAPS
100.0000 mg | ORAL_CAPSULE | Freq: Two times a day (BID) | ORAL | Status: DC
  Administered 2022-05-13 – 2022-05-17 (×8): 100 mg via ORAL
  Filled 2022-05-13 (×10): qty 1

## 2022-05-13 MED ORDER — CEFAZOLIN SODIUM 1 G IJ SOLR
INTRAMUSCULAR | Status: AC
  Filled 2022-05-13: qty 10

## 2022-05-13 MED ORDER — POLYETHYLENE GLYCOL 3350 17 G PO PACK
17.0000 g | PACK | Freq: Every day | ORAL | Status: DC
  Administered 2022-05-14 – 2022-05-17 (×3): 17 g via ORAL
  Filled 2022-05-13 (×4): qty 1

## 2022-05-13 MED ORDER — MORPHINE SULFATE (PF) 2 MG/ML IV SOLN
0.5000 mg | INTRAVENOUS | Status: DC | PRN
  Administered 2022-05-13 – 2022-05-14 (×4): 1 mg via INTRAVENOUS
  Filled 2022-05-13 (×4): qty 1

## 2022-05-13 MED ORDER — OXYCODONE HCL 5 MG PO TABS: 5.0000 mg | ORAL_TABLET | Freq: Once | ORAL | Status: DC | PRN

## 2022-05-13 MED ORDER — TRAMADOL HCL 50 MG PO TABS
50.0000 mg | ORAL_TABLET | Freq: Four times a day (QID) | ORAL | Status: DC | PRN
  Administered 2022-05-13 – 2022-05-14 (×2): 50 mg via ORAL
  Filled 2022-05-13 (×3): qty 1

## 2022-05-13 MED ORDER — TRAZODONE HCL 50 MG PO TABS
25.0000 mg | ORAL_TABLET | Freq: Every evening | ORAL | Status: DC | PRN
  Administered 2022-05-14 – 2022-05-17 (×3): 25 mg via ORAL
  Filled 2022-05-13 (×3): qty 1

## 2022-05-13 MED ORDER — DILTIAZEM HCL 30 MG PO TABS: 30.0000 mg | ORAL_TABLET | Freq: Every day | ORAL | Status: DC | PRN

## 2022-05-13 MED ORDER — DEXAMETHASONE SODIUM PHOSPHATE 10 MG/ML IJ SOLN
10.0000 mg | Freq: Once | INTRAMUSCULAR | Status: AC
  Administered 2022-05-14: 10 mg via INTRAVENOUS
  Filled 2022-05-13: qty 1

## 2022-05-13 MED ORDER — METHOCARBAMOL 500 MG IVPB - SIMPLE MED: 500.0000 mg | Freq: Four times a day (QID) | INTRAVENOUS | Status: DC | PRN

## 2022-05-13 MED ORDER — METOCLOPRAMIDE HCL 5 MG/ML IJ SOLN: 5.0000 mg | Freq: Three times a day (TID) | INTRAMUSCULAR | Status: DC | PRN

## 2022-05-13 MED ORDER — DIPHENHYDRAMINE HCL 12.5 MG/5ML PO ELIX: 12.5000 mg | ORAL_SOLUTION | ORAL | Status: DC | PRN

## 2022-05-13 MED ORDER — LEVOTHYROXINE SODIUM 100 MCG PO TABS
100.0000 ug | ORAL_TABLET | Freq: Every day | ORAL | Status: DC
  Administered 2022-05-14 – 2022-05-18 (×5): 100 ug via ORAL
  Filled 2022-05-13 (×5): qty 1

## 2022-05-13 MED ORDER — MENTHOL 3 MG MT LOZG: 1.0000 | LOZENGE | OROMUCOSAL | Status: DC | PRN

## 2022-05-13 MED ORDER — PROPOFOL 10 MG/ML IV BOLUS
INTRAVENOUS | Status: DC | PRN
  Administered 2022-05-13: 30 mg via INTRAVENOUS

## 2022-05-13 MED ORDER — MECLIZINE HCL 25 MG PO TABS: 12.5000 mg | ORAL_TABLET | Freq: Three times a day (TID) | ORAL | Status: DC | PRN

## 2022-05-13 MED ORDER — BISACODYL 10 MG RE SUPP: 10.0000 mg | Freq: Every day | RECTAL | Status: DC | PRN

## 2022-05-13 MED ORDER — KETOROLAC TROMETHAMINE 30 MG/ML IJ SOLN
INTRAMUSCULAR | Status: DC | PRN
  Administered 2022-05-13: 30 mg

## 2022-05-13 MED ORDER — HYDROCHLOROTHIAZIDE 12.5 MG PO TABS
12.5000 mg | ORAL_TABLET | Freq: Every day | ORAL | Status: DC
  Administered 2022-05-14 – 2022-05-18 (×5): 12.5 mg via ORAL
  Filled 2022-05-13 (×5): qty 1

## 2022-05-13 MED ORDER — POLYETHYLENE GLYCOL 3350 17 G PO PACK: 17.0000 g | PACK | Freq: Every day | ORAL | Status: DC | PRN

## 2022-05-13 MED ORDER — TRANEXAMIC ACID-NACL 1000-0.7 MG/100ML-% IV SOLN
1000.0000 mg | INTRAVENOUS | Status: AC
  Administered 2022-05-13: 1000 mg via INTRAVENOUS
  Filled 2022-05-13: qty 100

## 2022-05-13 MED ORDER — ALUM & MAG HYDROXIDE-SIMETH 200-200-20 MG/5ML PO SUSP
30.0000 mL | ORAL | Status: DC | PRN
  Administered 2022-05-15 (×2): 30 mL via ORAL
  Filled 2022-05-13 (×3): qty 30

## 2022-05-13 MED ORDER — ONDANSETRON HCL 4 MG/2ML IJ SOLN
4.0000 mg | Freq: Four times a day (QID) | INTRAMUSCULAR | Status: DC | PRN
  Administered 2022-05-14 – 2022-05-16 (×3): 4 mg via INTRAVENOUS
  Filled 2022-05-13 (×3): qty 2

## 2022-05-13 MED ORDER — CEFAZOLIN SODIUM-DEXTROSE 2-3 GM-%(50ML) IV SOLR
INTRAVENOUS | Status: DC | PRN
  Administered 2022-05-13: 2 g via INTRAVENOUS

## 2022-05-13 MED ORDER — FENTANYL CITRATE PF 50 MCG/ML IJ SOSY
100.0000 ug | PREFILLED_SYRINGE | INTRAMUSCULAR | Status: DC
  Administered 2022-05-13: 75 ug via INTRAVENOUS
  Filled 2022-05-13: qty 2

## 2022-05-13 MED ORDER — METOCLOPRAMIDE HCL 5 MG PO TABS: 5.0000 mg | ORAL_TABLET | Freq: Three times a day (TID) | ORAL | Status: DC | PRN

## 2022-05-13 MED ORDER — POVIDONE-IODINE 10 % EX SWAB
2.0000 | Freq: Once | CUTANEOUS | Status: AC
  Administered 2022-05-13: 2 via TOPICAL

## 2022-05-13 MED ORDER — LACTATED RINGERS IV SOLN: INTRAVENOUS | Status: DC

## 2022-05-13 MED ORDER — PHENOL 1.4 % MT LIQD: 1.0000 | OROMUCOSAL | Status: DC | PRN

## 2022-05-13 MED ORDER — ACETAMINOPHEN 500 MG PO TABS
1000.0000 mg | ORAL_TABLET | Freq: Once | ORAL | Status: AC
  Administered 2022-05-13: 1000 mg via ORAL

## 2022-05-13 MED ORDER — ORAL CARE MOUTH RINSE: 15.0000 mL | Freq: Once | OROMUCOSAL | Status: AC

## 2022-05-13 MED ORDER — OXYCODONE HCL 5 MG/5ML PO SOLN: 5.0000 mg | Freq: Once | ORAL | Status: DC | PRN

## 2022-05-13 MED ORDER — POTASSIUM CHLORIDE IN NACL 20-0.9 MEQ/L-% IV SOLN
INTRAVENOUS | Status: DC
  Filled 2022-05-13 (×2): qty 1000

## 2022-05-13 MED ORDER — SODIUM CHLORIDE 0.9 % IR SOLN
Status: DC | PRN
  Administered 2022-05-13: 1000 mL

## 2022-05-13 MED ORDER — VANCOMYCIN HCL IN DEXTROSE 1-5 GM/200ML-% IV SOLN: 1000.0000 mg | INTRAVENOUS | Status: AC

## 2022-05-13 MED ORDER — APIXABAN 2.5 MG PO TABS
2.5000 mg | ORAL_TABLET | Freq: Two times a day (BID) | ORAL | Status: DC
  Administered 2022-05-14 – 2022-05-18 (×9): 2.5 mg via ORAL
  Filled 2022-05-13 (×9): qty 1

## 2022-05-13 MED ORDER — ACETAMINOPHEN 325 MG PO TABS: 325.0000 mg | ORAL_TABLET | Freq: Four times a day (QID) | ORAL | Status: DC | PRN

## 2022-05-13 MED ORDER — HYDROCODONE-ACETAMINOPHEN 5-325 MG PO TABS
1.0000 | ORAL_TABLET | ORAL | Status: DC | PRN
  Administered 2022-05-14: 2 via ORAL
  Filled 2022-05-13: qty 2

## 2022-05-13 MED ORDER — MIDAZOLAM HCL 2 MG/2ML IJ SOLN
2.0000 mg | INTRAMUSCULAR | Status: DC
  Filled 2022-05-13: qty 2

## 2022-05-13 MED ORDER — MELATONIN 5 MG PO TABS
10.0000 mg | ORAL_TABLET | Freq: Every evening | ORAL | Status: DC | PRN
  Administered 2022-05-15 – 2022-05-16 (×2): 10 mg via ORAL
  Filled 2022-05-13 (×2): qty 2

## 2022-05-13 MED ORDER — CHLORHEXIDINE GLUCONATE 0.12 % MT SOLN
15.0000 mL | Freq: Once | OROMUCOSAL | Status: AC
  Administered 2022-05-13: 15 mL via OROMUCOSAL

## 2022-05-13 MED ORDER — ONDANSETRON HCL 4 MG/2ML IJ SOLN: 4.0000 mg | Freq: Once | INTRAMUSCULAR | Status: DC | PRN

## 2022-05-13 MED ORDER — ACETAMINOPHEN 500 MG PO TABS
500.0000 mg | ORAL_TABLET | Freq: Four times a day (QID) | ORAL | Status: AC
  Administered 2022-05-14 (×2): 500 mg via ORAL
  Filled 2022-05-13 (×3): qty 1

## 2022-05-13 MED ORDER — BUPIVACAINE HCL 0.25 % IJ SOLN
INTRAMUSCULAR | Status: DC | PRN
  Administered 2022-05-13: 30 mL

## 2022-05-13 MED ORDER — WATER FOR IRRIGATION, STERILE IR SOLN
Status: DC | PRN
  Administered 2022-05-13: 2000 mL

## 2022-05-13 MED ORDER — TRANEXAMIC ACID-NACL 1000-0.7 MG/100ML-% IV SOLN
INTRAVENOUS | Status: AC
  Filled 2022-05-13: qty 100

## 2022-05-13 MED ORDER — ACETAMINOPHEN 500 MG PO TABS
ORAL_TABLET | ORAL | Status: AC
  Filled 2022-05-13: qty 2

## 2022-05-13 MED ORDER — VANCOMYCIN HCL IN DEXTROSE 1-5 GM/200ML-% IV SOLN
INTRAVENOUS | Status: AC
  Administered 2022-05-13: 1000 mg via INTRAVENOUS
  Filled 2022-05-13: qty 200

## 2022-05-13 MED ORDER — CEFAZOLIN SODIUM-DEXTROSE 2-4 GM/100ML-% IV SOLN
2.0000 g | Freq: Four times a day (QID) | INTRAVENOUS | Status: AC
  Administered 2022-05-13 – 2022-05-14 (×2): 2 g via INTRAVENOUS
  Filled 2022-05-13 (×2): qty 100

## 2022-05-13 MED ORDER — PROPOFOL 500 MG/50ML IV EMUL
INTRAVENOUS | Status: DC | PRN
  Administered 2022-05-13: 70 ug/kg/min via INTRAVENOUS

## 2022-05-13 MED ORDER — FENTANYL CITRATE PF 50 MCG/ML IJ SOSY: 25.0000 ug | PREFILLED_SYRINGE | INTRAMUSCULAR | Status: DC | PRN

## 2022-05-13 SURGICAL SUPPLY — 51 items
ATTUNE MED DOME PAT 38 KNEE (Knees) IMPLANT
ATTUNE PS FEM RT SZ 5 CEM KNEE (Femur) IMPLANT
BAG COUNTER SPONGE SURGICOUNT (BAG) IMPLANT
BAG ZIPLOCK 12X15 (MISCELLANEOUS) IMPLANT
BASEPLATE TIB CMT FB PCKT SZ6 (Knees) IMPLANT
BLADE SAG 18X100X1.27 (BLADE) ×1 IMPLANT
BLADE SAW SGTL 11.0X1.19X90.0M (BLADE) IMPLANT
BLADE SAW SGTL 13X75X1.27 (BLADE) ×1 IMPLANT
BLADE SURG 15 STRL LF DISP TIS (BLADE) ×1 IMPLANT
BLADE SURG 15 STRL SS (BLADE) ×1
BNDG ELASTIC 6X10 VLCR STRL LF (GAUZE/BANDAGES/DRESSINGS) ×1 IMPLANT
BOWL SMART MIX CTS (DISPOSABLE) ×1 IMPLANT
CEMENT HV SMART SET (Cement) ×2 IMPLANT
CLSR STERI-STRIP ANTIMIC 1/2X4 (GAUZE/BANDAGES/DRESSINGS) ×2 IMPLANT
COVER SURGICAL LIGHT HANDLE (MISCELLANEOUS) ×1 IMPLANT
CUFF TOURN SGL QUICK 34 (TOURNIQUET CUFF) ×1
CUFF TRNQT CYL 34X4.125X (TOURNIQUET CUFF) ×1 IMPLANT
DRAPE SHEET LG 3/4 BI-LAMINATE (DRAPES) ×1 IMPLANT
DRAPE U-SHAPE 47X51 STRL (DRAPES) ×1 IMPLANT
DRSG MEPILEX POST OP 4X12 (GAUZE/BANDAGES/DRESSINGS) ×1 IMPLANT
DURAPREP 26ML APPLICATOR (WOUND CARE) ×2 IMPLANT
ELECT REM PT RETURN 15FT ADLT (MISCELLANEOUS) ×1 IMPLANT
GAUZE PAD ABD 8X10 STRL (GAUZE/BANDAGES/DRESSINGS) ×2 IMPLANT
GLOVE BIO SURGEON STRL SZ 6.5 (GLOVE) ×1 IMPLANT
GLOVE BIO SURGEON STRL SZ7.5 (GLOVE) ×1 IMPLANT
GLOVE BIOGEL PI IND STRL 7.0 (GLOVE) ×1 IMPLANT
GLOVE BIOGEL PI IND STRL 8 (GLOVE) ×1 IMPLANT
GOWN STRL SURGICAL XL XLNG (GOWN DISPOSABLE) ×2 IMPLANT
HANDPIECE INTERPULSE COAX TIP (DISPOSABLE) ×1
HOLDER FOLEY CATH W/STRAP (MISCELLANEOUS) IMPLANT
IMMOBILIZER KNEE 20 (SOFTGOODS) ×1
IMMOBILIZER KNEE 20 THIGH 36 (SOFTGOODS) ×1 IMPLANT
INSERT TIB ATTUNE FB SZ5X6 (Insert) IMPLANT
KIT TURNOVER KIT A (KITS) IMPLANT
MANIFOLD NEPTUNE II (INSTRUMENTS) ×1 IMPLANT
NS IRRIG 1000ML POUR BTL (IV SOLUTION) ×1 IMPLANT
PACK TOTAL KNEE CUSTOM (KITS) ×1 IMPLANT
PIN STEINMAN FIXATION KNEE (PIN) IMPLANT
PROTECTOR NERVE ULNAR (MISCELLANEOUS) ×1 IMPLANT
SET HNDPC FAN SPRY TIP SCT (DISPOSABLE) ×1 IMPLANT
SET PAD KNEE POSITIONER (MISCELLANEOUS) ×1 IMPLANT
SPIKE FLUID TRANSFER (MISCELLANEOUS) IMPLANT
SUT VIC AB 1 CT1 36 (SUTURE) ×2 IMPLANT
SUT VIC AB 2-0 CT1 27 (SUTURE) ×1
SUT VIC AB 2-0 CT1 TAPERPNT 27 (SUTURE) ×1 IMPLANT
SUT VIC AB 3-0 SH 27 (SUTURE)
SUT VIC AB 3-0 SH 27X BRD (SUTURE) IMPLANT
TRAY FOLEY MTR SLVR 16FR STAT (SET/KITS/TRAYS/PACK) ×1 IMPLANT
TUBE SUCTION HIGH CAP CLEAR NV (SUCTIONS) ×1 IMPLANT
WATER STERILE IRR 1000ML POUR (IV SOLUTION) ×2 IMPLANT
WRAP KNEE MAXI GEL POST OP (GAUZE/BANDAGES/DRESSINGS) ×1 IMPLANT

## 2022-05-13 NOTE — Anesthesia Procedure Notes (Addendum)
  Anesthesia Regional Block: Adductor canal block   Pre-Anesthetic Checklist: , timeout performed,  Correct Patient, Correct Site, Correct Laterality,  Correct Procedure, Correct Position, site marked,  Risks and benefits discussed,  Surgical consent,  Pre-op evaluation,  At surgeon's request and post-op pain management  Laterality: Right  Prep: chloraprep       Needles:  Injection technique: Single-shot  Needle Type: Echogenic Stimulator Needle     Needle Length: 10cm  Needle Gauge: 21   Needle insertion depth: 7 cm   Additional Needles:   Procedures:,,,, ultrasound used (permanent image in chart),,    Narrative:  Start time: 05/13/2022 1:15 PM End time: 05/13/2022 1:20 PM Injection made incrementally with aspirations every 5 mL.  Performed by: Personally  Anesthesiologist: Josephine Igo, MD  Additional Notes: Timeout performed. Patient sedated. Relevant anatomy ID'd using [place]. Incremental 2-26m injection of LA with frequent aspiration. Patient tolerated procedure well.

## 2022-05-13 NOTE — Discharge Instructions (Signed)

## 2022-05-13 NOTE — Anesthesia Procedure Notes (Signed)
Spinal  Patient location during procedure: OR Start time: 05/13/2022 2:35 PM End time: 05/13/2022 2:39 PM Reason for block: surgical anesthesia Staffing Performed by: Josephine Igo, MD Authorized by: Josephine Igo, MD   Preanesthetic Checklist Completed: patient identified, IV checked, site marked, risks and benefits discussed, surgical consent, monitors and equipment checked, pre-op evaluation and timeout performed Spinal Block Patient position: sitting Prep: DuraPrep and site prepped and draped Patient monitoring: heart rate, cardiac monitor, continuous pulse ox and blood pressure Approach: midline Location: L3-4 Injection technique: single-shot Needle Needle type: Pencan  Needle gauge: 24 G Needle length: 9 cm Needle insertion depth: 6.5 cm Assessment Sensory level: T6 Events: CSF return Additional Notes Patient tolerated procedure well. Adequate sensory level.

## 2022-05-13 NOTE — Anesthesia Postprocedure Evaluation (Signed)
Anesthesia Post Note  Patient: Samantha Henderson  Procedure(s) Performed: TOTAL KNEE ARTHROPLASTY (Right: Knee)     Patient location during evaluation: PACU Anesthesia Type: Spinal Level of consciousness: awake and alert Pain management: pain level controlled Vital Signs Assessment: post-procedure vital signs reviewed and stable Respiratory status: spontaneous breathing and respiratory function stable Cardiovascular status: blood pressure returned to baseline and stable Postop Assessment: spinal receding Anesthetic complications: no  No notable events documented.  Last Vitals:  Vitals:   05/13/22 1800 05/13/22 1824  BP: 117/74 (!) 141/51  Pulse: (!) 57 (!) 56  Resp: 18 16  Temp: 36.8 C (!) 36.4 C  SpO2: 98% 100%    Last Pain:  Vitals:   05/13/22 1800  TempSrc:   PainSc: 0-No pain                 Senaya Dicenso DANIEL

## 2022-05-13 NOTE — Transfer of Care (Signed)
Immediate Anesthesia Transfer of Care Note  Patient: BINTOU LAFATA  Procedure(s) Performed: TOTAL KNEE ARTHROPLASTY (Right: Knee)  Patient Location: PACU  Anesthesia Type:Spinal  Level of Consciousness: awake, alert , and patient cooperative  Airway & Oxygen Therapy: Patient Spontanous Breathing and Patient connected to face mask oxygen  Post-op Assessment: Report given to RN and Post -op Vital signs reviewed and stable  Post vital signs: Reviewed and stable  Last Vitals:  Vitals Value Taken Time  BP 120/69 05/13/22 1716  Temp    Pulse 57 05/13/22 1719  Resp 25 05/13/22 1719  SpO2 100 % 05/13/22 1719  Vitals shown include unvalidated device data.  Last Pain:  Vitals:   05/13/22 1357  TempSrc:   PainSc: 0-No pain         Complications: No notable events documented.

## 2022-05-13 NOTE — Op Note (Signed)
DATE OF SURGERY:  05/13/2022 TIME: 4:34 PM  PATIENT NAME:  Samantha Henderson   AGE: 88 y.o.    PRE-OPERATIVE DIAGNOSIS: Right knee primary localized osteoarthritis  POST-OPERATIVE DIAGNOSIS:  Same  PROCEDURE: Right total Knee Arthroplasty  SURGEON:  Johnny Bridge, MD   ASSISTANT:  Merlene Pulling, PA-C, present and scrubbed throughout the case, critical for assistance with exposure, retraction, instrumentation, and closure.  Second Assistant: Tobe Sos, medical student  OPERATIVE IMPLANTS: Depuy Attune size 5 posterior Stabilized Femur, with a size 6 fixed Bearing Tibia, 6 Polyethylene insert with a 38 medialized oval dome polyethylene patella.  PREOPERATIVE INDICATIONS:  Samantha Henderson is a 88 y.o. year old female with end stage bone on bone degenerative arthritis of the knee who failed conservative treatment, including injections, antiinflammatories, activity modification, and assistive devices, and had significant impairment of their activities of daily living, and elected for Total Knee Arthroplasty.   The risks, benefits, and alternatives were discussed at length including but not limited to the risks of infection, bleeding, nerve injury, stiffness, blood clots, the need for revision surgery, cardiopulmonary complications, among others, and they were willing to proceed.  OPERATIVE FINDINGS AND UNIQUE ASPECTS OF THE CASE: She had significant valgus with eburnation of the lateral femoral condyle.  The patella was actually pretty well-preserved.  Overall tissue quality was fairly weak.  I was able to fully dislocate the femur behind the tibia.  She had just a little bit of notching anteriorly, but I did not feel like she was a size 6.  The flexion gap also was a little bit on the loose side, although felt appropriate with the patella in place.  ESTIMATED BLOOD LOSS: 150 mL  OPERATIVE DESCRIPTION:  The patient was brought to the operative room and placed in a supine position.  Anesthesia  was administered.  IV antibiotics were given.  The lower extremity was prepped and draped in the usual sterile fashion.  Time out was performed.  The leg was elevated and exsanguinated and the tourniquet was inflated.  Anterior quadriceps tendon splitting approach was performed.  The patella was everted and osteophytes were removed.  The anterior horn of the medial and lateral meniscus was removed.   The patella was then measured, and cut with the saw.  The thickness before the cut was 22.5 and after the cut was 14.  A metal shield was used to protect the patella throughout the case.    The distal femur was opened with the drill and the intramedullary distal femoral cutting jig was utilized, set at 5 degrees resecting 9 mm off the distal femur.  Care was taken to protect the collateral ligaments.  Then the extramedullary tibial cutting jig was utilized making the appropriate cut using the anterior tibial crest as a reference building in appropriate posterior slope.  Care was taken during the cut to protect the medial and collateral ligaments.  The proximal tibia was removed along with the posterior horns of the menisci.  The PCL was sacrificed.    The extensor gap was measured and found to have adequate resection, measuring to a size 6.    The distal femoral sizing jig was applied, taking care to avoid notching.  This was set at 3 degrees of external rotation.  Then the 4-in-1 cutting jig was applied and the anterior and posterior femur was cut, along with the chamfer cuts.  All posterior osteophytes were removed.  The flexion gap was then measured and was symmetric with  the extension gap.  I completed the distal femoral preparation using the appropriate jig to prepare the box.  The proximal tibia sized and prepared accordingly with the reamer and the punch, and then all components were trialed with the poly insert.  The knee was found to have excellent balance and full motion.    The above named  components were then cemented into place and all excess cement was removed.  The real polyethylene implant was placed.  After the cement had cured I released the tourniquet and confirmed excellent hemostasis with no major posterior vessel injury.    The knee was easily taken through a range of motion and the patella tracked well and the knee irrigated copiously and the parapatellar and subcutaneous tissue closed with vicryl, and monocryl with steri strips for the skin.  The wounds were injected with marcaine, and dressed with sterile gauze and the patient was awakened and returned to the PACU in stable and satisfactory condition.  There were no complications.  Total tourniquet time was ~80 minutes.

## 2022-05-13 NOTE — Anesthesia Preprocedure Evaluation (Addendum)
Anesthesia Evaluation  Patient identified by MRN, date of birth, ID band Patient awake    Reviewed: Allergy & Precautions, NPO status , Patient's Chart, lab work & pertinent test results, reviewed documented beta blocker date and time   Airway Mallampati: I  TM Distance: >3 FB Neck ROM: Limited    Dental no notable dental hx. (+) Teeth Intact, Dental Advisory Given   Pulmonary neg pulmonary ROS   Pulmonary exam normal breath sounds clear to auscultation       Cardiovascular hypertension, Pt. on medications and Pt. on home beta blockers pulmonary hypertension+ Peripheral Vascular Disease  + dysrhythmias Atrial Fibrillation + Valvular Problems/Murmurs MR and AI  Rhythm:Regular Rate:Bradycardia  Bilateral carotid stenosis 1-39% Hx/o rheumatic fever  EKG 01/21/22 SB with PAC's, LAD, incomplete RBBB  Echo 10/03/19 1. Left ventricular ejection fraction, by estimation, is 60 to 65%. The  left ventricle has normal function. The left ventricle has no regional  wall motion abnormalities. There is mild concentric left ventricular  hypertrophy. Left ventricular diastolic  parameters are consistent with Grade II diastolic dysfunction  (pseudonormalization).   2. Right ventricular systolic function is normal. The right ventricular  size is normal. There is normal pulmonary artery systolic pressure. The  estimated right ventricular systolic pressure is 85.0 mmHg.   3. Left atrial size was mildly dilated.   4. Right atrial size was moderately dilated.   5. The mitral valve is normal in structure. Mild mitral valve  regurgitation. No evidence of mitral stenosis.   6. The aortic valve is tricuspid. Aortic valve regurgitation is mild.  Mild aortic valve sclerosis is present, with no evidence of aortic valve  stenosis.   7. The inferior vena cava is normal in size with greater than 50%  respiratory variability, suggesting right atrial pressure  of 3 mmHg.   8. Cystic structure in the liver measuring 5.8 x 5.4cm. Consider  dedicated liver US.   Myocardia perfusion scan 06/06/19  The left ventricular ejection fraction is hyperdynamic (>65%).  Nuclear stress EF: 66%.  There was no ST segment deviation noted during stress.  The study is normal.  This is a low risk study.   Normal resting and stress perfusion. No ischemia or infarction EF 66%     Neuro/Psych  Neuromuscular disease  negative psych ROS   GI/Hepatic negative GI ROS, Neg liver ROS,,,  Endo/Other  Hypothyroidism  Hyperlipidemia  Renal/GU Renal InsufficiencyRenal disease  negative genitourinary   Musculoskeletal  (+) Arthritis , Osteoarthritis,  Polymyalgia rheumatica Temporal arteritis DJD right knee   Abdominal   Peds  Hematology Eliquis therapy- last dose 1/20   Anesthesia Other Findings   Reproductive/Obstetrics                             Anesthesia Physical Anesthesia Plan  ASA: 3  Anesthesia Plan: Spinal   Post-op Pain Management: Regional block*, Minimal or no pain anticipated and Tylenol PO (pre-op)*   Induction: Intravenous  PONV Risk Score and Plan: 3 and Propofol infusion and Treatment may vary due to age or medical condition  Airway Management Planned: Natural Airway, Nasal Cannula and Simple Face Mask  Additional Equipment: None  Intra-op Plan:   Post-operative Plan:   Informed Consent: I have reviewed the patients History and Physical, chart, labs and discussed the procedure including the risks, benefits and alternatives for the proposed anesthesia with the patient or authorized representative who has indicated his/her understanding and acceptance.  Dental advisory given  Plan Discussed with: Anesthesiologist and CRNA  Anesthesia Plan Comments:         Anesthesia Quick Evaluation

## 2022-05-13 NOTE — Interval H&P Note (Signed)
History and Physical Interval Note:  05/13/2022 1:19 PM  Samantha Henderson  has presented today for surgery, with the diagnosis of DJD RIGHT KNEE.  The various methods of treatment have been discussed with the patient and family. After consideration of risks, benefits and other options for treatment, the patient has consented to  Procedure(s): TOTAL KNEE ARTHROPLASTY (Right) as a surgical intervention.  The patient's history has been reviewed, patient examined, no change in status, stable for surgery.  I have reviewed the patient's chart and labs.  Questions were answered to the patient's satisfaction.    She doesn't remember having a severe reaction to keflex so we will probably do vanc and ancef.     Johnny Bridge

## 2022-05-13 NOTE — OR Nursing (Signed)
Patient has prior bruising across her left buttock, noted during positioning. Samantha Henderson

## 2022-05-14 ENCOUNTER — Encounter (HOSPITAL_COMMUNITY): Payer: Self-pay | Admitting: Orthopedic Surgery

## 2022-05-14 DIAGNOSIS — K5900 Constipation, unspecified: Secondary | ICD-10-CM | POA: Diagnosis not present

## 2022-05-14 DIAGNOSIS — Z8249 Family history of ischemic heart disease and other diseases of the circulatory system: Secondary | ICD-10-CM | POA: Diagnosis not present

## 2022-05-14 DIAGNOSIS — Z90710 Acquired absence of both cervix and uterus: Secondary | ICD-10-CM | POA: Diagnosis not present

## 2022-05-14 DIAGNOSIS — M1711 Unilateral primary osteoarthritis, right knee: Secondary | ICD-10-CM | POA: Diagnosis not present

## 2022-05-14 DIAGNOSIS — Z881 Allergy status to other antibiotic agents status: Secondary | ICD-10-CM | POA: Diagnosis not present

## 2022-05-14 DIAGNOSIS — Z96651 Presence of right artificial knee joint: Secondary | ICD-10-CM

## 2022-05-14 DIAGNOSIS — Z7901 Long term (current) use of anticoagulants: Secondary | ICD-10-CM | POA: Diagnosis not present

## 2022-05-14 DIAGNOSIS — Z79899 Other long term (current) drug therapy: Secondary | ICD-10-CM | POA: Diagnosis not present

## 2022-05-14 DIAGNOSIS — Z882 Allergy status to sulfonamides status: Secondary | ICD-10-CM | POA: Diagnosis not present

## 2022-05-14 DIAGNOSIS — M353 Polymyalgia rheumatica: Secondary | ICD-10-CM | POA: Diagnosis not present

## 2022-05-14 DIAGNOSIS — E039 Hypothyroidism, unspecified: Secondary | ICD-10-CM | POA: Diagnosis not present

## 2022-05-14 DIAGNOSIS — I10 Essential (primary) hypertension: Secondary | ICD-10-CM | POA: Diagnosis not present

## 2022-05-14 DIAGNOSIS — Z823 Family history of stroke: Secondary | ICD-10-CM | POA: Diagnosis not present

## 2022-05-14 DIAGNOSIS — R262 Difficulty in walking, not elsewhere classified: Secondary | ICD-10-CM | POA: Diagnosis not present

## 2022-05-14 DIAGNOSIS — I083 Combined rheumatic disorders of mitral, aortic and tricuspid valves: Secondary | ICD-10-CM | POA: Diagnosis not present

## 2022-05-14 DIAGNOSIS — Z888 Allergy status to other drugs, medicaments and biological substances status: Secondary | ICD-10-CM | POA: Diagnosis not present

## 2022-05-14 DIAGNOSIS — Z79890 Hormone replacement therapy (postmenopausal): Secondary | ICD-10-CM | POA: Diagnosis not present

## 2022-05-14 DIAGNOSIS — Z79891 Long term (current) use of opiate analgesic: Secondary | ICD-10-CM | POA: Diagnosis not present

## 2022-05-14 DIAGNOSIS — I272 Pulmonary hypertension, unspecified: Secondary | ICD-10-CM | POA: Diagnosis not present

## 2022-05-14 DIAGNOSIS — Z885 Allergy status to narcotic agent status: Secondary | ICD-10-CM | POA: Diagnosis not present

## 2022-05-14 LAB — BASIC METABOLIC PANEL
Anion gap: 7 (ref 5–15)
BUN: 15 mg/dL (ref 8–23)
CO2: 24 mmol/L (ref 22–32)
Calcium: 8.5 mg/dL — ABNORMAL LOW (ref 8.9–10.3)
Chloride: 107 mmol/L (ref 98–111)
Creatinine, Ser: 0.96 mg/dL (ref 0.44–1.00)
GFR, Estimated: 58 mL/min — ABNORMAL LOW (ref >60)
Glucose, Bld: 127 mg/dL — ABNORMAL HIGH (ref 70–99)
Potassium: 3.7 mmol/L (ref 3.5–5.1)
Sodium: 138 mmol/L (ref 135–145)

## 2022-05-14 LAB — CBC
HCT: 35.2 % — ABNORMAL LOW (ref 36.0–46.0)
Hemoglobin: 11.3 g/dL — ABNORMAL LOW (ref 12.0–15.0)
MCH: 29.5 pg (ref 26.0–34.0)
MCHC: 32.1 g/dL (ref 30.0–36.0)
MCV: 91.9 fL (ref 80.0–100.0)
Platelets: UNDETERMINED 10*3/uL (ref 150–400)
RBC: 3.83 MIL/uL — ABNORMAL LOW (ref 3.87–5.11)
RDW: 12.7 % (ref 11.5–15.5)
WBC: 7.1 10*3/uL (ref 4.0–10.5)
nRBC: 0 % (ref 0.0–0.2)

## 2022-05-14 MED ORDER — TRAMADOL HCL 50 MG PO TABS
50.0000 mg | ORAL_TABLET | Freq: Four times a day (QID) | ORAL | Status: DC | PRN
  Administered 2022-05-15 – 2022-05-17 (×5): 50 mg via ORAL
  Filled 2022-05-14 (×5): qty 1

## 2022-05-14 MED ORDER — OXYCODONE HCL 5 MG PO TABS
5.0000 mg | ORAL_TABLET | ORAL | Status: DC | PRN
  Administered 2022-05-14 – 2022-05-15 (×5): 10 mg via ORAL
  Filled 2022-05-14 (×6): qty 2

## 2022-05-14 MED ORDER — HYDROCODONE-ACETAMINOPHEN 5-325 MG PO TABS
1.0000 | ORAL_TABLET | ORAL | Status: DC | PRN
  Administered 2022-05-14 – 2022-05-15 (×3): 2 via ORAL
  Administered 2022-05-16: 1 via ORAL
  Administered 2022-05-16 – 2022-05-18 (×6): 2 via ORAL
  Filled 2022-05-14 (×8): qty 2
  Filled 2022-05-14: qty 1
  Filled 2022-05-14 (×2): qty 2

## 2022-05-14 NOTE — Evaluation (Signed)
Physical Therapy Evaluation Patient Details Name: Samantha Henderson MRN: [id number] DOB: 1934/07/19 Today's Date: 05/14/2022  History of Present Illness  88 y.o. female admitted 05/13/22 for R TKA. PMH: HTN, bradycardia.  Clinical Impression  Pt is s/p TKA resulting in the deficits listed below (see PT Problem List). Mod assist for supine to sit and sit to stand. Pt took 3 steps forwards with a RW, distance limited by 8/10 pain in R knee. Initiated TKA HEP.  Pt will benefit from skilled PT to increase their independence and safety with mobility to allow discharge to the venue listed below.         Recommendations for follow up therapy are one component of a multi-disciplinary discharge planning process, led by the attending physician.  Recommendations may be updated based on patient status, additional functional criteria and insurance authorization.  Follow Up Recommendations Follow physician's recommendations for discharge plan and follow up therapies      Assistance Recommended at Discharge Intermittent Supervision/Assistance  Patient can return home with the following  A little help with walking and/or transfers;A little help with bathing/dressing/bathroom;Assistance with cooking/housework;Assist for transportation;Help with stairs or ramp for entrance    Equipment Recommendations None recommended by PT  Recommendations for Other Services       Functional Status Assessment Patient has had a recent decline in their functional status and demonstrates the ability to make significant improvements in function in a reasonable and predictable amount of time.     Precautions / Restrictions Precautions Precautions: Knee Precaution Booklet Issued: Yes (comment) Precaution Comments: reviewed no pillow under knee Restrictions Weight Bearing Restrictions: No Other Position/Activity Restrictions: WBAT      Mobility  Bed Mobility Overal bed mobility: Needs Assistance Bed Mobility: Supine to  Sit     Supine to sit: Mod assist     General bed mobility comments: assist to raise trunk, pivot hips and advance RLE    Transfers Overall transfer level: Needs assistance Equipment used: Rolling walker (2 wheels) Transfers: Sit to/from Stand Sit to Stand: Mod assist, From elevated surface           General transfer comment: VCs hand placement, mod A to power up    Ambulation/Gait Ambulation/Gait assistance: Min assist Gait Distance (Feet): 3 Feet Assistive device: Rolling walker (2 wheels) Gait Pattern/deviations: Step-to pattern, Decreased step length - right, Decreased step length - left, Decreased weight shift to right, Trunk flexed Gait velocity: decr     General Gait Details: pt took 3 steps forward with RW, distance limited by pain, VCs for sequencing and posture  Stairs            Wheelchair Mobility    Modified Rankin (Stroke Patients Only)       Balance Overall balance assessment: Needs assistance   Sitting balance-Leahy Scale: Good     Standing balance support: Bilateral upper extremity supported Standing balance-Leahy Scale: Poor                               Pertinent Vitals/Pain Pain Assessment Pain Assessment: 0-10 Pain Score: 8  Pain Location: R knee Pain Descriptors / Indicators: Grimacing, Operative site guarding Pain Intervention(s): Limited activity within patient's tolerance, Monitored during session, Premedicated before session, Ice applied    Home Living Family/patient expects to be discharged to:: Private residence Living Arrangements: Alone Available Help at Discharge: Family;Available 24 hours/day   Home Access: Ramped entrance  Home Layout: One level Home Equipment: Conservation officer, nature (2 wheels);BSC/3in1 Additional Comments: daughter in law to care for pt    Prior Function Prior Level of Function : Independent/Modified Independent                     Hand Dominance         Extremity/Trunk Assessment   Upper Extremity Assessment Upper Extremity Assessment: Overall WFL for tasks assessed    Lower Extremity Assessment Lower Extremity Assessment: RLE deficits/detail RLE Deficits / Details: knee AAROM 10-25*, limited by pain RLE Sensation: WNL RLE Coordination: WNL    Cervical / Trunk Assessment Cervical / Trunk Assessment: Kyphotic  Communication   Communication: HOH  Cognition Arousal/Alertness: Awake/alert Behavior During Therapy: WFL for tasks assessed/performed Overall Cognitive Status: Within Functional Limits for tasks assessed                                          General Comments      Exercises Total Joint Exercises Ankle Circles/Pumps: AROM, Both, 10 reps Heel Slides: AAROM, Right, 10 reps, Supine Goniometric ROM: 10-25* AAROM R knee limited by pain   Assessment/Plan    PT Assessment Patient needs continued PT services  PT Problem List Decreased range of motion;Decreased strength;Decreased activity tolerance;Pain;Decreased mobility       PT Treatment Interventions DME instruction;Gait training;Therapeutic activities;Therapeutic exercise;Functional mobility training;Patient/family education    PT Goals (Current goals can be found in the Care Plan section)  Acute Rehab PT Goals Patient Stated Goal: decrease pain PT Goal Formulation: With patient Time For Goal Achievement: 05/21/22 Potential to Achieve Goals: Good    Frequency 7X/week     Co-evaluation               AM-PAC PT "6 Clicks" Mobility  Outcome Measure Help needed turning from your back to your side while in a flat bed without using bedrails?: A Little Help needed moving from lying on your back to sitting on the side of a flat bed without using bedrails?: A Lot Help needed moving to and from a bed to a chair (including a wheelchair)?: A Lot Help needed standing up from a chair using your arms (e.g., wheelchair or bedside chair)?: A  Lot Help needed to walk in hospital room?: A Lot Help needed climbing 3-5 steps with a railing? : Total 6 Click Score: 12    End of Session Equipment Utilized During Treatment: Gait belt Activity Tolerance: Patient limited by pain Patient left: in chair;with chair alarm set;with call bell/phone within reach;with family/visitor present Nurse Communication: Mobility status PT Visit Diagnosis: Difficulty in walking, not elsewhere classified (R26.2);Muscle weakness (generalized) (M62.81);Pain Pain - Right/Left: Right Pain - part of body: Knee    Time: 5003-7048 PT Time Calculation (min) (ACUTE ONLY): 25 min   Charges:   PT Evaluation $PT Eval Moderate Complexity: 1 Mod PT Treatments $Gait Training: 8-22 mins        Blondell Reveal Kistler PT 05/14/2022  Acute Rehabilitation Services  Office [phone]

## 2022-05-14 NOTE — Progress Notes (Signed)
Patient continues to struggle with pain and difficulty walking when working with PT. Will plan to keep overnight tonight for increase in functional improvement and pain control.   Merlene Pulling, PA-C

## 2022-05-14 NOTE — TOC Transition Note (Signed)
Transition of Care [facility]) - CM/SW Discharge Note  Patient Details  Name: Samantha Henderson MRN: [id number] Date of Birth: 08/05/1934  [facility]) CM/SW Contact:  Sherie Don, LCSW Phone Number: 05/14/2022, 10:44 AM  Clinical Narrative: Patient is expected to discharge home after working with PT. CSW met with patient and a family member, Glora Hulgan, to review discharge plan and needs. Patient will go home with HHPT, which was prearranged with [place], before transitioning to OPPT at [facility]. Patient reported she has a rolling walker at home, but will need a 3N1. Patient declined to private pay for one through [place], so Roselyn Reef reported to CSW that she has taken care of the 3N1. TOC signing off.    Final next level of care: [place] Barriers to Discharge: No Barriers Identified  Patient Goals and CMS Choice Choice offered to / list presented to : Patient  Discharge Plan and Services Additional resources added to the After Visit Summary for          DME Arranged: N/A DME Agency: NA HH Arranged: PT HH Agency: [place] Representative spoke with at [place] in orthopedist's office  Social Determinants of Health ([place]) Interventions SDOH Screenings   Food Insecurity: No Food Insecurity (05/13/2022)  Housing: Low Risk  (05/13/2022)  Transportation Needs: No Transportation Needs (05/13/2022)  Utilities: Not At Risk (05/13/2022)  Tobacco Use: Low Risk  (05/13/2022)   Readmission Risk Interventions     No data to display

## 2022-05-14 NOTE — Progress Notes (Signed)
Physical Therapy Treatment Patient Details Name: Samantha Henderson MRN: [id number] DOB: 08-02-1934 Today's Date: 05/14/2022   History of Present Illness 88 y.o. female admitted 05/13/22 for R TKA. PMH: HTN, bradycardia.    PT Comments    Pt continues to have significant pain with all movement. MOd assist for supine to sit and for sit to stand, pt required 2 attempts to successfully stand. Min A to advance RLE for pt to take 5 steps, distance limited by pain. Pt performed R TKA exercises with assistance. She is progressing slowly with mobility and will need to make significant progress before she's ready to DC home.     Recommendations for follow up therapy are one component of a multi-disciplinary discharge planning process, led by the attending physician.  Recommendations may be updated based on patient status, additional functional criteria and insurance authorization.  Follow Up Recommendations  Follow physician's recommendations for discharge plan and follow up therapies     Assistance Recommended at Discharge Intermittent Supervision/Assistance  Patient can return home with the following A little help with walking and/or transfers;A little help with bathing/dressing/bathroom;Assistance with cooking/housework;Assist for transportation;Help with stairs or ramp for entrance   Equipment Recommendations  None recommended by PT    Recommendations for Other Services       Precautions / Restrictions Precautions Precautions: Knee Precaution Booklet Issued: Yes (comment) Precaution Comments: reviewed no pillow under knee Restrictions Weight Bearing Restrictions: No Other Position/Activity Restrictions: WBAT     Mobility  Bed Mobility Overal bed mobility: Needs Assistance Bed Mobility: Supine to Sit     Supine to sit: Mod assist     General bed mobility comments: assist to raise trunk, pivot hips and advance RLE    Transfers Overall transfer level: Needs assistance Equipment  used: Rolling walker (2 wheels) Transfers: Sit to/from Stand Sit to Stand: Mod assist, From elevated surface           General transfer comment: VCs hand placement, mod A to power up, 2 attempts to successfully rise    Ambulation/Gait Ambulation/Gait assistance: Min assist Gait Distance (Feet): 5 Feet Assistive device: Rolling walker (2 wheels) Gait Pattern/deviations: Step-to pattern, Decreased step length - right, Decreased step length - left, Decreased weight shift to right, Trunk flexed Gait velocity: decr     General Gait Details: pt took 5 steps forward with RW, distance limited by pain, VCs for sequencing and posture, assist to advance RLE   Stairs             Wheelchair Mobility    Modified Rankin (Stroke Patients Only)       Balance Overall balance assessment: Needs assistance   Sitting balance-Leahy Scale: Good     Standing balance support: Bilateral upper extremity supported Standing balance-Leahy Scale: Poor                              Cognition Arousal/Alertness: Awake/alert Behavior During Therapy: WFL for tasks assessed/performed Overall Cognitive Status: Within Functional Limits for tasks assessed                                          Exercises Total Joint Exercises Ankle Circles/Pumps: AROM, Both, 10 reps Quad Sets: AROM, Right, 5 reps, Supine Heel Slides: AAROM, Right, 10 reps, Supine Long Arc Quad: AAROM, Right, 10 reps, Seated Knee Flexion: AAROM,  Right, 10 reps, Seated Goniometric ROM: 5-40* AAROM R knee limited by pain    General Comments        Pertinent Vitals/Pain Pain Assessment Pain Assessment: 0-10 Pain Score: 8  Pain Location: R knee Pain Descriptors / Indicators: Grimacing, Operative site guarding Pain Intervention(s): Limited activity within patient's tolerance, Monitored during session, Premedicated before session, Ice applied    Home Living Family/patient expects to be  discharged to:: Private residence Living Arrangements: Alone Available Help at Discharge: Family;Available 24 hours/day   Home Access: Ramped entrance       Home Layout: One level Home Equipment: Conservation officer, nature (2 wheels);BSC/3in1 Additional Comments: daughter in law to care for pt    Prior Function            PT Goals (current goals can now be found in the care plan section) Acute Rehab PT Goals Patient Stated Goal: decrease pain PT Goal Formulation: With patient Time For Goal Achievement: 05/21/22 Potential to Achieve Goals: Good Progress towards PT goals: Progressing toward goals    Frequency    7X/week      PT Plan Current plan remains appropriate    Co-evaluation              AM-PAC PT "6 Clicks" Mobility   Outcome Measure  Help needed turning from your back to your side while in a flat bed without using bedrails?: A Little Help needed moving from lying on your back to sitting on the side of a flat bed without using bedrails?: A Lot Help needed moving to and from a bed to a chair (including a wheelchair)?: A Lot Help needed standing up from a chair using your arms (e.g., wheelchair or bedside chair)?: A Lot Help needed to walk in hospital room?: A Lot Help needed climbing 3-5 steps with a railing? : Total 6 Click Score: 12    End of Session Equipment Utilized During Treatment: Gait belt Activity Tolerance: Patient limited by pain Patient left: in chair;with chair alarm set;with call bell/phone within reach;with family/visitor present Nurse Communication: Mobility status PT Visit Diagnosis: Difficulty in walking, not elsewhere classified (R26.2);Muscle weakness (generalized) (M62.81);Pain Pain - Right/Left: Right Pain - part of body: Knee     Time: 1345-1416 PT Time Calculation (min) (ACUTE ONLY): 31 min  Charges:  $Gait Training: 8-22 mins $Therapeutic Exercise: 8-22 mins                     Blondell Reveal Kistler PT 05/14/2022  Acute  Rehabilitation Services  Office [phone]

## 2022-05-14 NOTE — Plan of Care (Signed)
  Problem: Education: Goal: Knowledge of the prescribed therapeutic regimen will improve Outcome: Progressing   Problem: Activity: Goal: Range of joint motion will improve Outcome: Progressing   Problem: Pain Management: Goal: Pain level will decrease with appropriate interventions Outcome: Progressing   Problem: Safety: Goal: Ability to remain free from injury will improve Outcome: Progressing   

## 2022-05-14 NOTE — Progress Notes (Signed)
     Subjective: 1 Day Post-Op s/p Procedure(s): TOTAL KNEE ARTHROPLASTY   Patient is alert, oriented. Pain overnight, feels very nauseated this morning. One episode of emesis overnight. No BM yet but passing gas. Foley removed but has not yet voided on own. Denies chest pain, SOB, Calf pain. No other complaints. Very hesitant about discharging home today.     Objective:  PE: VITALS:   Vitals:   05/13/22 1824 05/13/22 2033 05/14/22 0025 05/14/22 0504  BP: (!) 141/51 138/65 126/64 120/66  Pulse: (!) 56 61 63 60  Resp: '16 16 16 16  '$ Temp: (!) 97.5 F (36.4 C) 97.9 F (36.6 C) 98.2 F (36.8 C) 97.6 F (36.4 C)  TempSrc:  Oral    SpO2: 100% 100% 97% 98%  Weight:      Height:        Sensation intact distally Intact pulses distally Dorsiflexion/Plantar flexion intact Incision: moderate drainage  LABS  Results for orders placed or performed during the hospital encounter of 05/13/22 (from the past 24 hour(s))  CBC     Status: Abnormal   Collection Time: 05/14/22  3:45 AM  Result Value Ref Range   WBC 7.1 4.0 - 10.5 K/uL   RBC 3.83 (L) 3.87 - 5.11 MIL/uL   Hemoglobin 11.3 (L) 12.0 - 15.0 g/dL   HCT 35.2 (L) 36.0 - 46.0 %   MCV 91.9 80.0 - 100.0 fL   MCH 29.5 26.0 - 34.0 pg   MCHC 32.1 30.0 - 36.0 g/dL   RDW 12.7 11.5 - 15.5 %   Platelets PLATELET CLUMPS NOTED ON SMEAR, UNABLE TO ESTIMATE 150 - 400 K/uL   nRBC 0.0 0.0 - 0.2 %  Basic metabolic panel     Status: Abnormal   Collection Time: 05/14/22  3:45 AM  Result Value Ref Range   Sodium 138 135 - 145 mmol/L   Potassium 3.7 3.5 - 5.1 mmol/L   Chloride 107 98 - 111 mmol/L   CO2 24 22 - 32 mmol/L   Glucose, Bld 127 (H) 70 - 99 mg/dL   BUN 15 8 - 23 mg/dL   Creatinine, Ser 0.96 0.44 - 1.00 mg/dL   Calcium 8.5 (L) 8.9 - 10.3 mg/dL   GFR, Estimated 58 (L) >60 mL/min   Anion gap 7 5 - 15    DG Knee Right Port  Result Date: 05/13/2022 CLINICAL DATA:  Postop EXAM: PORTABLE RIGHT KNEE - 1-2 VIEW COMPARISON:  None  Available. FINDINGS: Status post right knee replacement with intact hardware and normal alignment. Moderate gas in the soft tissues consistent with recent surgery. No fracture IMPRESSION: Status post right knee replacement with expected postoperative change. Electronically Signed   By: Donavan Foil M.D.   On: 05/13/2022 23:56    Assessment/Plan: Principal Problem:   S/P total knee arthroplasty, right  1 Day Post-Op s/p Procedure(s): TOTAL KNEE ARTHROPLASTY  Weightbearing: WBAT RLE, up with therapy Insicional and dressing care: Reinforce dressings as needed, new dressing placed this morning VTE prophylaxis: Home eliquis restarted this morning Pain control: Will keep current regimen, recommend using tramadol rather than hydrocodone to see if this will decrease her nausea.  Follow - up plan: 2 weeks with Dr. Mardelle Matte Dispo: pending further progress today  Contact information:   Merlene Pulling, PA-C Weekdays 8-5  After hours and holidays please check [email or web address] for group call information for [facility]  Ventura Bruns 05/14/2022, 8:44 AM

## 2022-05-15 LAB — CBC
HCT: 35.5 % — ABNORMAL LOW (ref 36.0–46.0)
Hemoglobin: 11.4 g/dL — ABNORMAL LOW (ref 12.0–15.0)
MCH: 29.2 pg (ref 26.0–34.0)
MCHC: 32.1 g/dL (ref 30.0–36.0)
MCV: 91 fL (ref 80.0–100.0)
Platelets: 181 10*3/uL (ref 150–400)
RBC: 3.9 MIL/uL (ref 3.87–5.11)
RDW: 12.6 % (ref 11.5–15.5)
WBC: 10.9 10*3/uL — ABNORMAL HIGH (ref 4.0–10.5)
nRBC: 0 % (ref 0.0–0.2)

## 2022-05-15 MED ORDER — ACETAMINOPHEN 325 MG PO TABS
650.0000 mg | ORAL_TABLET | ORAL | Status: DC
  Administered 2022-05-15 – 2022-05-17 (×7): 650 mg via ORAL
  Filled 2022-05-15 (×9): qty 2

## 2022-05-15 NOTE — Progress Notes (Signed)
     Subjective: 2 Days Post-Op s/p Procedure(s): TOTAL KNEE ARTHROPLASTY   Patient is alert, oriented. Nausea resolved, has had a BM. Denies chest pain, SOB, Calf pain. No other complaints. Pain has been difficult to control but increase to oxycodone helping.     Objective:  PE: VITALS:   Vitals:   05/14/22 1338 05/14/22 2125 05/15/22 0133 05/15/22 0609  BP: 122/66 127/71 119/77 107/61  Pulse: 69 81 77 67  Resp:  '16 17 17  '$ Temp:  98.5 F (36.9 C) 98.2 F (36.8 C) 98.4 F (36.9 C)  TempSrc:  Oral Oral Oral  SpO2: 94% 95% 96% 96%  Weight:      Height:        Sensation intact distally Intact pulses distally Dorsiflexion/Plantar flexion intact Incision: dressing C/D/I  LABS  Results for orders placed or performed during the hospital encounter of 05/13/22 (from the past 24 hour(s))  CBC     Status: Abnormal   Collection Time: 05/15/22  3:37 AM  Result Value Ref Range   WBC 10.9 (H) 4.0 - 10.5 K/uL   RBC 3.90 3.87 - 5.11 MIL/uL   Hemoglobin 11.4 (L) 12.0 - 15.0 g/dL   HCT 35.5 (L) 36.0 - 46.0 %   MCV 91.0 80.0 - 100.0 fL   MCH 29.2 26.0 - 34.0 pg   MCHC 32.1 30.0 - 36.0 g/dL   RDW 12.6 11.5 - 15.5 %   Platelets 181 150 - 400 K/uL   nRBC 0.0 0.0 - 0.2 %    DG Knee Right Port  Result Date: 05/13/2022 CLINICAL DATA:  Postop EXAM: PORTABLE RIGHT KNEE - 1-2 VIEW COMPARISON:  None Available. FINDINGS: Status post right knee replacement with intact hardware and normal alignment. Moderate gas in the soft tissues consistent with recent surgery. No fracture IMPRESSION: Status post right knee replacement with expected postoperative change. Electronically Signed   By: Donavan Foil M.D.   On: 05/13/2022 23:56    Assessment/Plan: Principal Problem:   S/P total knee arthroplasty, right Active Problems:   S/P total knee replacement, right  2 Days Post-Op s/p Procedure(s): TOTAL KNEE ARTHROPLASTY  Weightbearing: WBAT RLE, up with therapy Insicional and dressing care:  Reinforce dressings as needed VTE prophylaxis: Home eliquis restarted this morning Pain control: Will keep current regimen. Discussed her prn options with her today. Follow - up plan: 2 weeks with Dr. Mardelle Matte Dispo: pending further progress with PT  Contact information:   Merlene Pulling, PA-C Weekdays 8-5  After hours and holidays please check [email or web address] for group call information for [facility]  Ventura Bruns 05/15/2022, 7:20 AM

## 2022-05-15 NOTE — Progress Notes (Signed)
Physical Therapy Treatment Patient Details Name: Samantha Henderson MRN: [id number] DOB: 1934-06-05 Today's Date: 05/15/2022   History of Present Illness 88 y.o. female admitted 05/13/22 for R TKA. PMH: HTN, bradycardia.    PT Comments    Pt continues to have significant pain with all mobility. She took 6 steps forward then stated she couldn't continue due to pain and "feeling weak", pt was encouraged to attempt further steps but she stated she was unable to. Performed R TKA exercises with assistance. She is progressing slowly with mobility.    Recommendations for follow up therapy are one component of a multi-disciplinary discharge planning process, led by the attending physician.  Recommendations may be updated based on patient status, additional functional criteria and insurance authorization.  Follow Up Recommendations  Follow physician's recommendations for discharge plan and follow up therapies     Assistance Recommended at Discharge Intermittent Supervision/Assistance  Patient can return home with the following A little help with walking and/or transfers;A little help with bathing/dressing/bathroom;Assistance with cooking/housework;Assist for transportation;Help with stairs or ramp for entrance   Equipment Recommendations  None recommended by PT    Recommendations for Other Services       Precautions / Restrictions Precautions Precautions: Knee Precaution Booklet Issued: Yes (comment) Precaution Comments: reviewed no pillow under knee Restrictions Weight Bearing Restrictions: No Other Position/Activity Restrictions: WBAT     Mobility  Bed Mobility Overal bed mobility: Needs Assistance Bed Mobility: Supine to Sit     Supine to sit: Min assist     General bed mobility comments: assist to raise trunk, pivot hips and advance RLE, used gait belt as RLE lifter    Transfers Overall transfer level: Needs assistance Equipment used: Rolling walker (2 wheels) Transfers: Sit  to/from Stand Sit to Stand: Mod assist, From elevated surface           General transfer comment: VCs hand placement, mod A to power up, 2 attempts to successfully rise    Ambulation/Gait Ambulation/Gait assistance: Min assist Gait Distance (Feet): 6 Feet Assistive device: Rolling walker (2 wheels) Gait Pattern/deviations: Step-to pattern, Decreased step length - right, Decreased step length - left, Decreased weight shift to right, Trunk flexed Gait velocity: decr     General Gait Details: pt took 6 steps forward with RW, distance limited by pain and "feeling weak", VCs for sequencing and posture   Stairs             Wheelchair Mobility    Modified Rankin (Stroke Patients Only)       Balance Overall balance assessment: Needs assistance   Sitting balance-Leahy Scale: Good     Standing balance support: Bilateral upper extremity supported Standing balance-Leahy Scale: Poor                              Cognition Arousal/Alertness: Awake/alert Behavior During Therapy: WFL for tasks assessed/performed Overall Cognitive Status: Within Functional Limits for tasks assessed                                          Exercises Total Joint Exercises Ankle Circles/Pumps: AROM, Both, 10 reps Quad Sets: AROM, 5 reps, Supine, Both Short Arc Quad: AAROM, Right, 5 reps, Supine Heel Slides: AAROM, Right, 10 reps, Supine Hip ABduction/ADduction: AAROM, Right, 5 reps, Supine Long Arc Quad: AAROM, Right, 10 reps, Seated Knee  Flexion: AAROM, Right, 10 reps, Seated Goniometric ROM: 5-50* AAROM R knee    General Comments        Pertinent Vitals/Pain Pain Assessment Pain Score: 8  Pain Location: R knee with movement Pain Descriptors / Indicators: Grimacing, Operative site guarding Pain Intervention(s): Limited activity within patient's tolerance, Monitored during session, Premedicated before session, Ice applied    Home Living                           Prior Function            PT Goals (current goals can now be found in the care plan section) Acute Rehab PT Goals Patient Stated Goal: decrease pain PT Goal Formulation: With patient Time For Goal Achievement: 05/21/22 Potential to Achieve Goals: Good Progress towards PT goals: Progressing toward goals    Frequency    7X/week      PT Plan Current plan remains appropriate    Co-evaluation              AM-PAC PT "6 Clicks" Mobility   Outcome Measure  Help needed turning from your back to your side while in a flat bed without using bedrails?: A Little Help needed moving from lying on your back to sitting on the side of a flat bed without using bedrails?: A Lot Help needed moving to and from a bed to a chair (including a wheelchair)?: A Lot Help needed standing up from a chair using your arms (e.g., wheelchair or bedside chair)?: A Lot Help needed to walk in hospital room?: A Little Help needed climbing 3-5 steps with a railing? : Total 6 Click Score: 13    End of Session Equipment Utilized During Treatment: Gait belt Activity Tolerance: Patient limited by pain Patient left: in chair;with chair alarm set;with call bell/phone within reach Nurse Communication: Mobility status PT Visit Diagnosis: Difficulty in walking, not elsewhere classified (R26.2);Muscle weakness (generalized) (M62.81);Pain Pain - Right/Left: Right Pain - part of body: Knee     Time: 6269-4854 PT Time Calculation (min) (ACUTE ONLY): 23 min  Charges:  $Gait Training: 8-22 mins $Therapeutic Exercise: 8-22 mins                     Blondell Reveal Kistler PT 05/15/2022  Acute Rehabilitation Services  Office [phone]

## 2022-05-15 NOTE — Progress Notes (Signed)
Physical Therapy Treatment Patient Details Name: Samantha Henderson MRN: [id number] DOB: [birth year] Today's Date: 05/15/2022   History of Present Illness 88 y.o. female admitted 05/13/22 for R TKA. PMH: HTN, bradycardia.    PT Comments    Pt is slowly progressing with PT, she took 8 steps with a RW this afternoon, distance limited by pain and nausea. Reviewed TKA HEP with daughter in law present. Pt will need to tolerate significant increase in ambulation distance before she's ready to DC home.     Recommendations for follow up therapy are one component of a multi-disciplinary discharge planning process, led by the attending physician.  Recommendations may be updated based on patient status, additional functional criteria and insurance authorization.  Follow Up Recommendations  Follow physician's recommendations for discharge plan and follow up therapies     Assistance Recommended at Discharge Intermittent Supervision/Assistance  Patient can return home with the following A little help with walking and/or transfers;A little help with bathing/dressing/bathroom;Assistance with cooking/housework;Assist for transportation;Help with stairs or ramp for entrance   Equipment Recommendations  None recommended by PT    Recommendations for Other Services       Precautions / Restrictions Precautions Precautions: Knee Precaution Booklet Issued: Yes (comment) Precaution Comments: reviewed no pillow under knee Restrictions Weight Bearing Restrictions: No Other Position/Activity Restrictions: WBAT     Mobility  Bed Mobility Overal bed mobility: Needs Assistance Bed Mobility: Supine to Sit, Sit to Supine     Supine to sit: Min assist Sit to supine: Min assist   General bed mobility comments: assist to raise trunk, pivot hips and advance RLE, used gait belt as RLE lifter    Transfers Overall transfer level: Needs assistance Equipment used: Rolling walker (2 wheels) Transfers: Sit to/from  Stand Sit to Stand: From elevated surface, Min assist           General transfer comment: VCs hand placement, min A to power up, 2 attempts to successfully rise    Ambulation/Gait Ambulation/Gait assistance: Min assist Gait Distance (Feet): 8 Feet Assistive device: Rolling walker (2 wheels) Gait Pattern/deviations: Step-to pattern, Decreased step length - right, Decreased step length - left, Decreased weight shift to right, Trunk flexed Gait velocity: decr     General Gait Details: pt took 8 steps forward with RW, distance limited by pain and nausea, VCs for sequencing and posture   Stairs             Wheelchair Mobility    Modified Rankin (Stroke Patients Only)       Balance Overall balance assessment: Needs assistance   Sitting balance-Leahy Scale: Good     Standing balance support: Bilateral upper extremity supported Standing balance-Leahy Scale: Poor                              Cognition Arousal/Alertness: Awake/alert Behavior During Therapy: WFL for tasks assessed/performed Overall Cognitive Status: Within Functional Limits for tasks assessed                                          Exercises Total Joint Exercises Ankle Circles/Pumps: AROM, Both, 10 reps Quad Sets: AROM, 5 reps, Supine, Both Short Arc Quad: AAROM, Right, 5 reps, Supine Heel Slides: AAROM, Right, 10 reps, Supine Hip ABduction/ADduction: AAROM, Right, 5 reps, Supine Straight Leg Raises: AAROM, Right, 5 reps, Supine Long  Arc Quad: AAROM, Right, 10 reps, Seated Knee Flexion: AAROM, Right, 10 reps, Seated Goniometric ROM: 5-50* AAROM R knee    General Comments        Pertinent Vitals/Pain Pain Assessment Pain Score: 6  Pain Location: R knee with movement Pain Descriptors / Indicators: Grimacing, Operative site guarding Pain Intervention(s): Limited activity within patient's tolerance, Monitored during session, Premedicated before session, Ice  applied    Home Living                          Prior Function            PT Goals (current goals can now be found in the care plan section) Acute Rehab PT Goals Patient Stated Goal: decrease pain PT Goal Formulation: With patient Time For Goal Achievement: 05/21/22 Potential to Achieve Goals: Good Progress towards PT goals: Progressing toward goals    Frequency    7X/week      PT Plan Current plan remains appropriate    Co-evaluation              AM-PAC PT "6 Clicks" Mobility   Outcome Measure  Help needed turning from your back to your side while in a flat bed without using bedrails?: A Little Help needed moving from lying on your back to sitting on the side of a flat bed without using bedrails?: A Little Help needed moving to and from a bed to a chair (including a wheelchair)?: A Little Help needed standing up from a chair using your arms (e.g., wheelchair or bedside chair)?: A Little Help needed to walk in hospital room?: A Little Help needed climbing 3-5 steps with a railing? : Total 6 Click Score: 16    End of Session Equipment Utilized During Treatment: Gait belt Activity Tolerance: Patient limited by pain Patient left: with call bell/phone within reach;in bed;with bed alarm set;with family/visitor present Nurse Communication: Mobility status PT Visit Diagnosis: Difficulty in walking, not elsewhere classified (R26.2);Muscle weakness (generalized) (M62.81);Pain Pain - Right/Left: Right Pain - part of body: Knee     Time: 3536-1443 PT Time Calculation (min) (ACUTE ONLY): 31 min  Charges:  $Gait Training: 8-22 mins $Therapeutic Exercise: 8-22 mins                     Blondell Reveal Kistler PT 05/15/2022  Acute Rehabilitation Services  Office [phone]

## 2022-05-16 MED ORDER — MAGNESIUM CITRATE PO SOLN
0.5000 | Freq: Once | ORAL | Status: AC
  Administered 2022-05-16: 0.5 via ORAL
  Filled 2022-05-16: qty 296

## 2022-05-16 MED ORDER — SODIUM CHLORIDE 0.9 % IV BOLUS
500.0000 mL | Freq: Once | INTRAVENOUS | Status: AC
  Administered 2022-05-16: 500 mL via INTRAVENOUS

## 2022-05-16 NOTE — Progress Notes (Signed)
     Subjective: 3 Days Post-Op s/p Procedure(s): TOTAL KNEE ARTHROPLASTY   Patient is alert, oriented. Nausea a little worse yesterday afternoon. Passing gas. Denies chest pain, SOB, Calf pain. No other complaints. Pain has been difficult to control but increase to oxycodone helping.     Objective:  PE: VITALS:   Vitals:   05/15/22 0943 05/15/22 1334 05/15/22 2154 05/16/22 0634  BP: (!) 143/66 (!) 112/54 (!) 120/56 (!) 114/57  Pulse: 76 72 66 61  Resp: '17 16 18 18  '$ Temp: 98.2 F (36.8 C) 98.4 F (36.9 C) 98.4 F (36.9 C) 98.4 F (36.9 C)  TempSrc: Oral Oral Oral Oral  SpO2: 98% 97% 97% 98%  Weight:      Height:        Sensation intact distally Intact pulses distally Dorsiflexion/Plantar flexion intact Incision: dressing C/D/I  LABS  No results found for this or any previous visit (from the past 24 hour(s)).   No results found.  Assessment/Plan: Principal Problem:   S/P total knee arthroplasty, right Active Problems:   S/P total knee replacement, right  3 Days Post-Op s/p Procedure(s): TOTAL KNEE ARTHROPLASTY - still struggling with pain control and mobility. I ordered CPM yesterday but doesn't look like it was delivered, will call again today. Had a long conversation about possible need for SNF placement due to lack of progress with PT. Patient really does not want to go to SNF, has adequate support at home, however she is not progressing quickly with PT.  - will add on another support for constipation  Weightbearing: WBAT RLE, up with therapy Insicional and dressing care: Reinforce dressings as needed VTE prophylaxis: Home eliquis restarted this morning Pain control: Will keep current regimen. Discussed her prn options with her today. Follow - up plan: 2 weeks with Dr. Mardelle Matte Dispo: pending further progress with PT  Contact information:   Merlene Pulling, PA-C Weekdays 8-5  After hours and holidays please check [email or web address] for group call information for  [facility]  Ventura Bruns 05/16/2022, 8:13 AM

## 2022-05-16 NOTE — Progress Notes (Signed)
Orthopedic Tech Progress Note Patient Details:  Samantha Henderson 08-07-1934 [id number] CPM will be applied after patient's physical therapy session.  Patient ID: Samantha Henderson, female   DOB: [birth year], 88 y.o.   MRN: [id number]  Jearld Lesch 05/16/2022, 9:39 AM

## 2022-05-16 NOTE — Progress Notes (Signed)
Physical Therapy Treatment Patient Details Name: Samantha Henderson MRN: [id number] DOB: 01-17-1935 Today's Date: 05/16/2022   History of Present Illness 88 y.o. female admitted 05/13/22 for R TKA. PMH: HTN, bradycardia.    PT Comments    Pt tolerated increased ambulation distance of 13' with RW, distance limited by fatigue. Pt denied nausea. Reviewed TKA HEP. Noted pt to receive a bolus of fluid this afternoon.    Recommendations for follow up therapy are one component of a multi-disciplinary discharge planning process, led by the attending physician.  Recommendations may be updated based on patient status, additional functional criteria and insurance authorization.  Follow Up Recommendations  Follow physician's recommendations for discharge plan and follow up therapies     Assistance Recommended at Discharge Intermittent Supervision/Assistance  Patient can return home with the following A little help with walking and/or transfers;A little help with bathing/dressing/bathroom;Assistance with cooking/housework;Assist for transportation;Help with stairs or ramp for entrance   Equipment Recommendations  None recommended by PT    Recommendations for Other Services       Precautions / Restrictions Precautions Precautions: Knee Precaution Booklet Issued: Yes (comment) Precaution Comments: reviewed no pillow under knee Restrictions Weight Bearing Restrictions: No RLE Weight Bearing: Weight bearing as tolerated Other Position/Activity Restrictions: WBAT     Mobility  Bed Mobility Overal bed mobility: Needs Assistance Bed Mobility: Supine to Sit     Supine to sit: Supervision, HOB elevated     General bed mobility comments: used bed rails, HOB up    Transfers Overall transfer level: Needs assistance Equipment used: Rolling walker (2 wheels) Transfers: Sit to/from Stand Sit to Stand: From elevated surface, Min guard           General transfer comment: VCs hand placement,   mildly unsteady with posterior lean initially upon standing    Ambulation/Gait Ambulation/Gait assistance: Min guard Gait Distance (Feet): 13 Feet Assistive device: Rolling walker (2 wheels) Gait Pattern/deviations: Step-to pattern, Decreased step length - right, Decreased step length - left, Decreased weight shift to right, Trunk flexed Gait velocity: decr     General Gait Details: distance limited by pain and fatigue   Stairs             Wheelchair Mobility    Modified Rankin (Stroke Patients Only)       Balance Overall balance assessment: Needs assistance   Sitting balance-Leahy Scale: Good     Standing balance support: Bilateral upper extremity supported Standing balance-Leahy Scale: Poor                              Cognition Arousal/Alertness: Awake/alert Behavior During Therapy: WFL for tasks assessed/performed Overall Cognitive Status: Within Functional Limits for tasks assessed                                          Exercises Total Joint Exercises Ankle Circles/Pumps: AROM, Both, 10 reps Quad Sets: AROM, 5 reps, Supine, Both Short Arc Quad: AAROM, Right, 5 reps, Supine Heel Slides: AAROM, Right, 10 reps, Supine, 5 reps Hip ABduction/ADduction: AAROM, Right, 5 reps, Supine Straight Leg Raises: AAROM, Right, 5 reps, Supine     General Comments        Pertinent Vitals/Pain Pain Assessment Pain Score: 7  Pain Location: R knee with movement Pain Descriptors / Indicators: Grimacing, Operative site guarding Pain Intervention(s):  Limited activity within patient's tolerance, Monitored during session, Premedicated before session, Ice applied    Home Living                          Prior Function            PT Goals (current goals can now be found in the care plan section) Acute Rehab PT Goals Patient Stated Goal: decrease pain PT Goal Formulation: With patient Time For Goal Achievement:  05/21/22 Potential to Achieve Goals: Good Progress towards PT goals: Progressing toward goals    Frequency    7X/week      PT Plan Current plan remains appropriate    Co-evaluation              AM-PAC PT "6 Clicks" Mobility   Outcome Measure  Help needed turning from your back to your side while in a flat bed without using bedrails?: A Little Help needed moving from lying on your back to sitting on the side of a flat bed without using bedrails?: A Little Help needed moving to and from a bed to a chair (including a wheelchair)?: A Little Help needed standing up from a chair using your arms (e.g., wheelchair or bedside chair)?: A Little Help needed to walk in hospital room?: A Little Help needed climbing 3-5 steps with a railing? : Total 6 Click Score: 16    End of Session Equipment Utilized During Treatment: Gait belt Activity Tolerance: Patient limited by pain;Patient limited by fatigue (nausea, dizziness) Patient left: with call bell/phone within reach;with family/visitor present;in chair Nurse Communication: Mobility status PT Visit Diagnosis: Difficulty in walking, not elsewhere classified (R26.2);Muscle weakness (generalized) (M62.81);Pain Pain - Right/Left: Right Pain - part of body: Knee     Time: 6195-0932 PT Time Calculation (min) (ACUTE ONLY): 20 min  Charges:  $Gait Training: 8-22 mins                     Blondell Reveal Kistler PT 05/16/2022  Acute Rehabilitation Services  Office [phone]

## 2022-05-16 NOTE — Progress Notes (Signed)
Physical Therapy Treatment Patient Details Name: Samantha Henderson MRN: [id number] DOB: [birth year] Today's Date: 05/16/2022   History of Present Illness 88 y.o. female admitted 05/13/22 for R TKA. PMH: HTN, bradycardia.    PT Comments    Pt is not progressing with mobility. She ambulated 7' this morning, distance limited by nausea and dizziness. Orthostatics were negative, however she was unable to stand for 3 minutes due to dizziness. (BP supine 126/56, sit 121/63, stand 129/60) Pt reports poor appetite and decreased intake of food and drink. Will notify nurse of pt's dizzness and nausea, may want to consider reducing narcotics.     Recommendations for follow up therapy are one component of a multi-disciplinary discharge planning process, led by the attending physician.  Recommendations may be updated based on patient status, additional functional criteria and insurance authorization.  Follow Up Recommendations  Follow physician's recommendations for discharge plan and follow up therapies     Assistance Recommended at Discharge Intermittent Supervision/Assistance  Patient can return home with the following A little help with walking and/or transfers;A little help with bathing/dressing/bathroom;Assistance with cooking/housework;Assist for transportation;Help with stairs or ramp for entrance   Equipment Recommendations  None recommended by PT    Recommendations for Other Services       Precautions / Restrictions Precautions Precautions: Knee Precaution Booklet Issued: Yes (comment) Precaution Comments: reviewed no pillow under knee Restrictions Weight Bearing Restrictions: No RLE Weight Bearing: Weight bearing as tolerated Other Position/Activity Restrictions: WBAT     Mobility  Bed Mobility Overal bed mobility: Needs Assistance Bed Mobility: Supine to Sit     Supine to sit: Supervision, HOB elevated     General bed mobility comments: used bed rails, HOB up     Transfers Overall transfer level: Needs assistance Equipment used: Rolling walker (2 wheels) Transfers: Sit to/from Stand Sit to Stand: From elevated surface, Min assist           General transfer comment: VCs hand placement, min A to power up, mildly unsteady wtih posterior lean initially upon standing    Ambulation/Gait Ambulation/Gait assistance: Min guard Gait Distance (Feet): 7 Feet Assistive device: Rolling walker (2 wheels) Gait Pattern/deviations: Step-to pattern, Decreased step length - right, Decreased step length - left, Decreased weight shift to right, Trunk flexed Gait velocity: decr     General Gait Details: distance limited by dizziness and nausea, see flowsheets for orthostatics (they were negative), but pt was unable to tolerate standing for 3' due to dizziness. Pt reports poor appetite, little intake p.o.   Stairs             Wheelchair Mobility    Modified Rankin (Stroke Patients Only)       Balance Overall balance assessment: Needs assistance   Sitting balance-Leahy Scale: Good     Standing balance support: Bilateral upper extremity supported Standing balance-Leahy Scale: Poor                              Cognition Arousal/Alertness: Awake/alert Behavior During Therapy: WFL for tasks assessed/performed Overall Cognitive Status: Within Functional Limits for tasks assessed                                          Exercises Total Joint Exercises Long Arc Quad: AAROM, Right, 10 reps, Seated Knee Flexion: AAROM, Right, 10 reps, Seated  General Comments        Pertinent Vitals/Pain Pain Assessment Pain Score: 8  Pain Location: R knee with movement Pain Descriptors / Indicators: Grimacing, Operative site guarding    Home Living                          Prior Function            PT Goals (current goals can now be found in the care plan section) Acute Rehab PT Goals Patient Stated  Goal: decrease pain PT Goal Formulation: With patient/family Time For Goal Achievement: 05/21/22 Potential to Achieve Goals: Good Progress towards PT goals: Not progressing toward goals - comment (nausea, dizziness, pain)    Frequency    7X/week      PT Plan Current plan remains appropriate    Co-evaluation              AM-PAC PT "6 Clicks" Mobility   Outcome Measure  Help needed turning from your back to your side while in a flat bed without using bedrails?: A Little Help needed moving from lying on your back to sitting on the side of a flat bed without using bedrails?: A Little Help needed moving to and from a bed to a chair (including a wheelchair)?: A Little Help needed standing up from a chair using your arms (e.g., wheelchair or bedside chair)?: A Little Help needed to walk in hospital room?: A Little Help needed climbing 3-5 steps with a railing? : Total 6 Click Score: 16    End of Session Equipment Utilized During Treatment: Gait belt Activity Tolerance: Patient limited by pain;Treatment limited secondary to medical complications (Comment) (nausea, dizziness) Patient left: with call bell/phone within reach;with family/visitor present;in chair;with chair alarm set Nurse Communication: Mobility status PT Visit Diagnosis: Difficulty in walking, not elsewhere classified (R26.2);Muscle weakness (generalized) (M62.81);Pain Pain - Right/Left: Right Pain - part of body: Knee     Time: 3875-6433 PT Time Calculation (min) (ACUTE ONLY): 19 min  Charges:  $Gait Training: 8-22 mins                     Blondell Reveal Kistler PT 05/16/2022  Acute Rehabilitation Services  Office [phone]

## 2022-05-16 NOTE — Progress Notes (Signed)
Orthopedic Tech Progress Note Patient Details:  Samantha Henderson [birth year] [id number] CPM was taken off at 6p.  CPM Right Knee CPM Right Knee: On Right Knee Flexion (Degrees): 40 Right Knee Extension (Degrees): 0  Post Interventions Patient Tolerated: Well  Samantha Henderson 05/16/2022, 5:58 PM

## 2022-05-16 NOTE — Plan of Care (Signed)
  Problem: Education: ?Goal: Knowledge of the prescribed therapeutic regimen will improve ?Outcome: Progressing ?Goal: Individualized Educational Video(s) ?Outcome: Progressing ?  ?Problem: Activity: ?Goal: Ability to avoid complications of mobility impairment will improve ?Outcome: Progressing ?Goal: Range of joint motion will improve ?Outcome: Progressing ?  ?Problem: Clinical Measurements: ?Goal: Postoperative complications will be avoided or minimized ?Outcome: Progressing ?  ?Problem: Pain Management: ?Goal: Pain level will decrease with appropriate interventions ?Outcome: Progressing ?  ?Problem: Skin Integrity: ?Goal: Will show signs of wound healing ?Outcome: Progressing ?  ?Problem: Education: ?Goal: Knowledge of General Education information will improve ?Description: Including pain rating scale, medication(s)/side effects and non-pharmacologic comfort measures ?Outcome: Progressing ?  ?Problem: Health Behavior/Discharge Planning: ?Goal: Ability to manage health-related needs will improve ?Outcome: Progressing ?  ?Problem: Clinical Measurements: ?Goal: Ability to maintain clinical measurements within normal limits will improve ?Outcome: Progressing ?Goal: Will remain free from infection ?Outcome: Progressing ?Goal: Diagnostic test results will improve ?Outcome: Progressing ?Goal: Respiratory complications will improve ?Outcome: Progressing ?Goal: Cardiovascular complication will be avoided ?Outcome: Progressing ?  ?Problem: Activity: ?Goal: Risk for activity intolerance will decrease ?Outcome: Progressing ?  ?Problem: Nutrition: ?Goal: Adequate nutrition will be maintained ?Outcome: Progressing ?  ?Problem: Coping: ?Goal: Level of anxiety will decrease ?Outcome: Progressing ?  ?Problem: Elimination: ?Goal: Will not experience complications related to bowel motility ?Outcome: Progressing ?Goal: Will not experience complications related to urinary retention ?Outcome: Progressing ?  ?Problem: Pain  Managment: ?Goal: General experience of comfort will improve ?Outcome: Progressing ?  ?Problem: Safety: ?Goal: Ability to remain free from injury will improve ?Outcome: Progressing ?  ?Problem: Skin Integrity: ?Goal: Risk for impaired skin integrity will decrease ?Outcome: Progressing ?  ?

## 2022-05-16 NOTE — Progress Notes (Signed)
Orthopedic Tech Progress Note Patient Details:  RAMAYA GUILE 1934-09-16 [id number] Patient unable to tolerate any setting above 0-40 CPM Right Knee CPM Right Knee: On Right Knee Flexion (Degrees): 40 Right Knee Extension (Degrees): 0  Post Interventions Patient Tolerated: Well  Linus Salmons Jolena Kittle 05/16/2022, 4:20 PM

## 2022-05-16 NOTE — TOC Progression Note (Signed)
Transition of Care [facility]) - Progression Note   Patient Details  Name: Samantha Henderson MRN: [id number] Date of Birth: 10/08/1934  Transition of Care [facility]) CM/SW [place], LCSW Phone Number: 05/16/2022, 3:27 PM  Clinical Narrative: Discharge plan is changing to SNF and patient is agreeable to being faxed out. FL2 done; PASRR confirmed. Initial referral faxed out. TOC awaiting bed offers.    Barriers to Discharge: SNF Pending bed offer, Insurance Authorization  Expected Discharge Plan and Services             DME Arranged: N/A DME Agency: NA HH Arranged: PT HH Agency: Magazine features editor spoke with at [place]: Prearranged in orthopedist's office  Social Determinants of Health (SDOH) Interventions SDOH Screenings   Food Insecurity: No Food Insecurity (05/13/2022)  Housing: Low Risk  (05/13/2022)  Transportation Needs: No Transportation Needs (05/13/2022)  Utilities: Not At Risk (05/13/2022)  Tobacco Use: Low Risk  (05/14/2022)   Readmission Risk Interventions     No data to display

## 2022-05-16 NOTE — NC FL2 (Signed)
[place] LEVEL OF CARE FORM     IDENTIFICATION  Patient Name: Samantha Henderson Birthdate: 10-Oct-1934 Sex: female Admission Date (Current Location): 05/13/2022  [facility] [place] and [place] Number:  Herbalist and Address:  [facility],  [place], [place]      Provider Number: [id number]  Attending Physician Name and Address:  Marchia Bond, MD  Relative Name and Phone Number:  Livie Vanderhoof (relative) Ph: [phone]    Current Level of Care: Hospital Recommended Level of Care: [place] Prior Approval Number:    Date Approved/Denied:   PASRR Number: [id number]  Discharge Plan: SNF    Current Diagnoses: Patient Active Problem List   Diagnosis Date Noted   S/P total knee replacement, right 05/14/2022   S/P total knee arthroplasty, right 05/13/2022   Carotid stenosis    Varicose vein of leg 07/19/2019   Mitral regurgitation 11/15/2018   Benign essential HTN 01/27/2017   Closed subcapital fracture of left femur ([place]) 04/12/2016   Left displaced femoral neck fracture ([place]) 04/12/2016   Joint pain 03/20/2014   Hepatic cyst 12/06/2013   Persistent atrial fibrillation ([place]) 11/23/2013   Temporal arteritis (HCC)    PAC (premature atrial contraction)    Palpitation    Leg cramps    Rheumatic fever    Aortic regurgitation    Ejection fraction    Dizziness    Diastolic dysfunction    Abdominal bruit    Bradycardia    Constipation    Arm weakness    Hypothyroidism 08/01/2008   Polymyalgia rheumatica ([place]) 08/01/2008    Orientation RESPIRATION BLADDER Height & Weight     Self, Time, Situation, Place  Normal Incontinent Weight: 121 lb (54.9 kg) Height:  5' 5.6" (166.6 cm)  BEHAVIORAL SYMPTOMS/MOOD NEUROLOGICAL BOWEL NUTRITION STATUS   (N/A)  (N/A) Continent Diet (Regular diet)  AMBULATORY STATUS COMMUNICATION OF NEEDS Skin   Limited Assist Verbally Surgical wounds (Ecchymosis: right leg)                        Personal Care Assistance Level of Assistance  Bathing, Feeding, Dressing Bathing Assistance: Limited assistance Feeding assistance: Independent Dressing Assistance: Limited assistance     Functional Limitations Info  Sight, Hearing, Speech Sight Info: Adequate Hearing Info: Impaired Speech Info: Adequate    SPECIAL CARE FACTORS FREQUENCY  PT (By licensed PT), OT (By licensed OT)     PT Frequency: 5x's/week OT Frequency: 5x's/week            Contractures Contractures Info: Not present    Additional Factors Info  Code Status, Allergies, Psychotropic Code Status Info: Full Allergies Info: Niacin And Related, Amlodipine, Cefdinir, Celecoxib, Codeine, Nebivolol Hcl, Pregabalin, Ciprofloxacin, Lisinopril, Plaquenil (Hydroxychloroquine), Sulfamethoxazole-trimethoprim, Augmentin (Amoxicillin-pot Clavulanate), Cephalexin, Sulfonamide Derivatives Psychotropic Info: Desyrel         Current Medications (05/16/2022):  This is the current hospital active medication list Current Facility-Administered Medications  Medication Dose Route Frequency Provider Last Rate Last Admin   acetaminophen (TYLENOL) tablet 650 mg  650 mg Oral Q4H Merlene Pulling K, PA-C   650 mg at 05/16/22 1315   alum & mag hydroxide-simeth (MAALOX/MYLANTA) 200-200-20 MG/5ML suspension 30 mL  30 mL Oral Q4H PRN Merlene Pulling K, PA-C   30 mL at 05/15/22 2027   apixaban (ELIQUIS) tablet 2.5 mg  2.5 mg Oral BID Merlene Pulling K, PA-C   2.5 mg at 05/16/22 0930   atorvastatin (LIPITOR) tablet  10 mg  10 mg Oral Daily Ventura Bruns, PA-C   10 mg at 05/16/22 0930   bisacodyl (DULCOLAX) suppository 10 mg  10 mg Rectal Daily PRN Merlene Pulling K, PA-C       diltiazem (CARDIZEM CD) 24 hr capsule 120 mg  120 mg Oral Daily Merlene Pulling K, PA-C   120 mg at 05/16/22 0940   diltiazem (CARDIZEM) tablet 30 mg  30 mg Oral Daily PRN Merlene Pulling K, PA-C       diphenhydrAMINE (BENADRYL) 12.5 MG/5ML elixir 12.5-25 mg  12.5-25 mg  Oral Q4H PRN Merlene Pulling K, PA-C       docusate sodium (COLACE) capsule 100 mg  100 mg Oral BID Owens Shark, Blaine K, PA-C   100 mg at 05/16/22 0930   hydrochlorothiazide (HYDRODIURIL) tablet 12.5 mg  12.5 mg Oral Daily Merlene Pulling K, PA-C   12.5 mg at 05/16/22 8546   HYDROcodone-acetaminophen (NORCO/VICODIN) 5-325 MG per tablet 1-2 tablet  1-2 tablet Oral Q4H PRN Ventura Bruns, PA-C   2 tablet at 05/16/22 2703   levothyroxine (SYNTHROID) tablet 100 mcg  100 mcg Oral QAC breakfast Ventura Bruns, PA-C   100 mcg at 05/16/22 5009   magnesium citrate solution 1 Bottle  1 Bottle Oral Once PRN Merlene Pulling K, PA-C       melatonin tablet 10 mg  10 mg Oral QHS PRN Merlene Pulling K, PA-C   10 mg at 05/15/22 2019   menthol-cetylpyridinium (CEPACOL) lozenge 3 mg  1 lozenge Oral PRN Merlene Pulling K, PA-C       Or   phenol (CHLORASEPTIC) mouth spray 1 spray  1 spray Mouth/Throat PRN Merlene Pulling K, PA-C       metoCLOPramide (REGLAN) tablet 5-10 mg  5-10 mg Oral Q8H PRN Merlene Pulling K, PA-C       Or   metoCLOPramide (REGLAN) injection 5-10 mg  5-10 mg Intravenous Q8H PRN Merlene Pulling K, PA-C       morphine (PF) 2 MG/ML injection 0.5-1 mg  0.5-1 mg Intravenous Q2H PRN Merlene Pulling K, PA-C   1 mg at 05/14/22 1849   ondansetron (ZOFRAN) tablet 4 mg  4 mg Oral Q6H PRN Merlene Pulling K, PA-C       Or   ondansetron [facility]) injection 4 mg  4 mg Intravenous Q6H PRN Merlene Pulling K, PA-C   4 mg at 05/16/22 1229   oxyCODONE (Oxy IR/ROXICODONE) immediate release tablet 5-10 mg  5-10 mg Oral Q4H PRN Merlene Pulling K, PA-C   10 mg at 05/15/22 2220   polyethylene glycol (MIRALAX / GLYCOLAX) packet 17 g  17 g Oral Daily Ventura Bruns, PA-C   17 g at 05/16/22 3818   senna (SENOKOT) tablet 8.6 mg  1 tablet Oral BID Merlene Pulling K, PA-C   8.6 mg at 05/16/22 0930   traMADol (ULTRAM) tablet 50 mg  50 mg Oral Q6H PRN Merlene Pulling K, PA-C   50 mg at 05/16/22 1315   traZODone (DESYREL) tablet 25 mg  25 mg Oral QHS PRN  Merlene Pulling K, PA-C   25 mg at 05/14/22 2993     Discharge Medications: Please see discharge summary for a list of discharge medications.  Relevant Imaging Results:  Relevant Lab Results:   Additional Information SSN: [id number]  Sherie Don, LCSW

## 2022-05-17 NOTE — Plan of Care (Signed)
  Problem: Education: Goal: Knowledge of General Education information will improve Description Including pain rating scale, medication(s)/side effects and non-pharmacologic comfort measures Outcome: Progressing   

## 2022-05-17 NOTE — Progress Notes (Signed)
Physical Therapy Treatment Patient Details Name: Samantha Henderson MRN: [id number] DOB: 1934-12-07 Today's Date: 05/17/2022   History of Present Illness 88 y.o. female admitted 05/13/22 for R TKA. PMH: HTN, bradycardia.    PT Comments    POD # 4 am session General Comments: AxO x 3 very pleasant Lady required MAX encouragement to participate having just got back to bed from bathroom with NT.  Assisted OOB to amb in hallway then assisted back to bed to perform TE's followed by application of CPM at 50 degree flexion. Pt plans to D/C to SNF    Recommendations for follow up therapy are one component of a multi-disciplinary discharge planning process, led by the attending physician.  Recommendations may be updated based on patient status, additional functional criteria and insurance authorization.  Follow Up Recommendations  Follow physician's recommendations for discharge plan and follow up therapies     Assistance Recommended at Discharge Intermittent Supervision/Assistance  Patient can return home with the following A little help with walking and/or transfers;A little help with bathing/dressing/bathroom;Assistance with cooking/housework;Assist for transportation;Help with stairs or ramp for entrance   Equipment Recommendations  None recommended by PT    Recommendations for Other Services       Precautions / Restrictions Precautions Precautions: Knee Precaution Comments: reviewed no pillow under knee Restrictions Weight Bearing Restrictions: No Other Position/Activity Restrictions: WBAT     Mobility  Bed Mobility Overal bed mobility: Needs Assistance Bed Mobility: Supine to Sit     Supine to sit: Supervision, HOB elevated     General bed mobility comments: demonstarted and Educated on how to use a belt toself assist LE    Transfers Overall transfer level: Needs assistance Equipment used: Rolling walker (2 wheels) Transfers: Sit to/from Stand Sit to Stand: From elevated  surface, Min guard, Min assist           General transfer comment: 50% VCs hand placement,  mildly unsteady with posterior lean initially upon standing    Ambulation/Gait Ambulation/Gait assistance: Min guard, Min assist Gait Distance (Feet): 22 Feet Assistive device: Rolling walker (2 wheels) Gait Pattern/deviations: Step-to pattern, Decreased step length - right, Decreased step length - left, Decreased weight shift to right, Trunk flexed Gait velocity: decreased     General Gait Details: slow progress due to pain and fatigue.  Tolerated amb only 22 feet in hallway.  Unsteady.  HIGH FALL RISK.   Stairs             Wheelchair Mobility    Modified Rankin (Stroke Patients Only)       Balance                                            Cognition Arousal/Alertness: Awake/alert Behavior During Therapy: WFL for tasks assessed/performed Overall Cognitive Status: Within Functional Limits for tasks assessed                                 General Comments: AxO x 3 very pleasant Lady required MAX encouragement to participate having just got back to bed from bathroom with NT.        Exercises      General Comments        Pertinent Vitals/Pain Pain Assessment Pain Assessment: 0-10 Pain Score: 7  Pain Location: R knee with movement Pain  Descriptors / Indicators: Grimacing, Operative site guarding, Tender Pain Intervention(s): Monitored during session, Premedicated before session, Repositioned, Ice applied    Home Living                          Prior Function            PT Goals (current goals can now be found in the care plan section) Progress towards PT goals: Progressing toward goals    Frequency    7X/week      PT Plan Current plan remains appropriate    Co-evaluation              AM-PAC PT "6 Clicks" Mobility   Outcome Measure  Help needed turning from your back to your side while in a flat  bed without using bedrails?: A Little Help needed moving from lying on your back to sitting on the side of a flat bed without using bedrails?: A Little Help needed moving to and from a bed to a chair (including a wheelchair)?: A Little Help needed standing up from a chair using your arms (e.g., wheelchair or bedside chair)?: A Little Help needed to walk in hospital room?: A Little Help needed climbing 3-5 steps with a railing? : A Lot 6 Click Score: 17    End of Session Equipment Utilized During Treatment: Gait belt Activity Tolerance: Patient limited by pain;Patient limited by fatigue Patient left: with call bell/phone within reach;with family/visitor present;in bed;in CPM   PT Visit Diagnosis: Difficulty in walking, not elsewhere classified (R26.2);Muscle weakness (generalized) (M62.81);Pain Pain - Right/Left: Right Pain - part of body: Knee     Time: 1012-1040 PT Time Calculation (min) (ACUTE ONLY): 28 min  Charges:  $Gait Training: 8-22 mins $Therapeutic Exercise: 8-22 mins                     Rica Koyanagi  PTA Acute  Rehabilitation Services Office M-F          [phone] Weekend pager [phone]

## 2022-05-17 NOTE — Progress Notes (Signed)
Physical Therapy Treatment Patient Details Name: Samantha Henderson MRN: [id number] DOB: [birth year] Today's Date: 05/17/2022   History of Present Illness 88 y.o. female admitted 05/13/22 for R TKA. PMH: HTN, bradycardia.    PT Comments    POD # 4 pm session Assisted OOB to amb to bathroom.  Slow and unsteady.  Assisted with peri care due to balance instability.  HIGH FALL RISK.  Assisted with amb from bathroom to recliner.  Positioned to comfort and applied ICE. Pt c/o mild nausea after and requested a Ginger Ale.  "I just took a bunch of pills", stated pt.  Pt will need ST Rehab at SNF to address mobility and functional decline prior to safely returning home.   Recommendations for follow up therapy are one component of a multi-disciplinary discharge planning process, led by the attending physician.  Recommendations may be updated based on patient status, additional functional criteria and insurance authorization.  Follow Up Recommendations  Follow physician's recommendations for discharge plan and follow up therapies     Assistance Recommended at Discharge Intermittent Supervision/Assistance  Patient can return home with the following A little help with walking and/or transfers;A little help with bathing/dressing/bathroom;Assistance with cooking/housework;Assist for transportation;Help with stairs or ramp for entrance   Equipment Recommendations  None recommended by PT    Recommendations for Other Services       Precautions / Restrictions Precautions Precautions: Knee Precaution Comments: reviewed no pillow under knee Restrictions Weight Bearing Restrictions: No RLE Weight Bearing: Weight bearing as tolerated Other Position/Activity Restrictions: WBAT     Mobility  Bed Mobility Overal bed mobility: Needs Assistance Bed Mobility: Supine to Sit     Supine to sit: Supervision, HOB elevated     General bed mobility comments: demonstarted and Educated on how to use a belt toself  assist LE    Transfers Overall transfer level: Needs assistance Equipment used: Rolling walker (2 wheels) Transfers: Sit to/from Stand Sit to Stand: From elevated surface, Min guard, Min assist           General transfer comment: 50% VCs hand placement,  mildly unsteady with posterior lean initially upon standing.  Also assisted with a toilet transfer.    Ambulation/Gait Ambulation/Gait assistance: Min guard, Min assist Gait Distance (Feet): 20 Feet Assistive device: Rolling walker (2 wheels) Gait Pattern/deviations: Step-to pattern, Decreased step length - right, Decreased step length - left, Decreased weight shift to right, Trunk flexed Gait velocity: decreased     General Gait Details: slow progress due to pain and fatigue.  Tolerated amb only 20 feet to and from bathroom. Fatigue. Unsteady.  HIGH FALL RISK.   Stairs             Wheelchair Mobility    Modified Rankin (Stroke Patients Only)       Balance                                            Cognition Arousal/Alertness: Awake/alert Behavior During Therapy: WFL for tasks assessed/performed Overall Cognitive Status: Within Functional Limits for tasks assessed                                 General Comments: AxO x 3 very pleasant Lady.        Exercises      General Comments  Pertinent Vitals/Pain Pain Assessment Pain Assessment: 0-10 Pain Score: 5  Pain Location: R knee with movement Pain Descriptors / Indicators: Grimacing, Operative site guarding, Tender Pain Intervention(s): Monitored during session, Premedicated before session, Repositioned, Ice applied    Home Living                          Prior Function            PT Goals (current goals can now be found in the care plan section) Progress towards PT goals: Progressing toward goals    Frequency    7X/week      PT Plan Current plan remains appropriate     Co-evaluation              AM-PAC PT "6 Clicks" Mobility   Outcome Measure  Help needed turning from your back to your side while in a flat bed without using bedrails?: A Little Help needed moving from lying on your back to sitting on the side of a flat bed without using bedrails?: A Little Help needed moving to and from a bed to a chair (including a wheelchair)?: A Little Help needed standing up from a chair using your arms (e.g., wheelchair or bedside chair)?: A Little Help needed to walk in hospital room?: A Little Help needed climbing 3-5 steps with a railing? : A Lot 6 Click Score: 17    End of Session Equipment Utilized During Treatment: Gait belt Activity Tolerance: Patient limited by pain;Patient limited by fatigue Patient left: in chair;with call bell/phone within reach Nurse Communication: Mobility status PT Visit Diagnosis: Difficulty in walking, not elsewhere classified (R26.2);Muscle weakness (generalized) (M62.81);Pain Pain - Right/Left: Right Pain - part of body: Knee     Time: 1410-3013 PT Time Calculation (min) (ACUTE ONLY): 23 min  Charges:  $Gait Training: 8-22 mins $Therapeutic Activity: 8-22 mins                     Rica Koyanagi  PTA [place] Office M-F          [phone] Weekend pager [phone]

## 2022-05-17 NOTE — Progress Notes (Signed)
     Subjective: 4 Days Post-Op s/p Procedure(s): TOTAL KNEE ARTHROPLASTY   Patient is alert, oriented. No complaints this morning. Hopeful for discharge home this weekend if continues to make progress with PT and pain control.     Objective:  PE: VITALS:   Vitals:   05/16/22 0634 05/16/22 0938 05/16/22 1244 05/17/22 0440  BP: (!) 114/57 112/60 (!) 109/54 132/65  Pulse: 61  (!) 58 (!) 58  Resp: '18  18 16  '$ Temp: 98.4 F (36.9 C)  98.2 F (36.8 C) 98.4 F (36.9 C)  TempSrc: Oral  Oral Oral  SpO2: 98%  97% 97%  Weight:      Height:        Sensation intact distally Intact pulses distally Dorsiflexion/Plantar flexion intact Incision: dressing C/D/I  LABS  No results found for this or any previous visit (from the past 24 hour(s)).   No results found.  Assessment/Plan: Principal Problem:   S/P total knee arthroplasty, right Active Problems:   S/P total knee replacement, right  4 Days Post-Op s/p Procedure(s): TOTAL KNEE ARTHROPLASTY - still struggling with pain control and mobility. I ordered CPM yesterday but doesn't look like it was delivered, will call again today. Had a long conversation about possible need for SNF placement due to lack of progress with PT. Patient really does not want to go to SNF, has adequate support at home, however she is not progressing quickly with PT.  - will add on another support for constipation  Weightbearing: WBAT RLE, up with therapy Insicional and dressing care: Reinforce dressings as needed VTE prophylaxis: Home eliquis restarted this morning Pain control: Will keep current regimen. Discussed her prn options with her today. Follow - up plan: 2 weeks with Dr. Mardelle Matte Dispo: pending further progress with PT  Contact information:   Merlene Pulling, PA-C Weekdays 8-5  After hours and holidays please check [email or web address] for group call information for [facility]  [place] Fant A Bethenny Losee 05/17/2022, 6:35 AM

## 2022-05-17 NOTE — Progress Notes (Signed)
Orthopedic Tech Progress Note Patient Details:  Samantha Henderson 25-Sep-1934 [id number] CPM was applied at 5:10p and will be removed at 7p.  CPM Right Knee CPM Right Knee: On Right Knee Flexion (Degrees): 60 Right Knee Extension (Degrees): 0  Post Interventions Patient Tolerated: Well  Linus Salmons Kouper Spinella 05/17/2022, 5:21 PM

## 2022-05-18 DIAGNOSIS — M1711 Unilateral primary osteoarthritis, right knee: Secondary | ICD-10-CM | POA: Diagnosis not present

## 2022-05-18 DIAGNOSIS — Z96651 Presence of right artificial knee joint: Secondary | ICD-10-CM | POA: Diagnosis not present

## 2022-05-18 MED ORDER — HYDROCODONE-ACETAMINOPHEN 10-325 MG PO TABS: 1.0000 | ORAL_TABLET | ORAL | 0 refills | Status: DC | PRN

## 2022-05-18 MED ORDER — HYDROCODONE-ACETAMINOPHEN 7.5-325 MG PO TABS
1.0000 | ORAL_TABLET | ORAL | Status: DC | PRN
  Administered 2022-05-18 (×2): 2 via ORAL
  Filled 2022-05-18 (×2): qty 2

## 2022-05-18 MED ORDER — SENNA-DOCUSATE SODIUM 8.6-50 MG PO TABS: 2.0000 | ORAL_TABLET | Freq: Every day | ORAL | 1 refills | Status: DC

## 2022-05-18 MED ORDER — ONDANSETRON HCL 4 MG PO TABS: 4.0000 mg | ORAL_TABLET | Freq: Three times a day (TID) | ORAL | 0 refills | Status: DC | PRN

## 2022-05-18 NOTE — Progress Notes (Signed)
Physical Therapy Treatment Patient Details Name: Samantha Henderson MRN: [id number] DOB: [birth year] Today's Date: 05/18/2022   History of Present Illness 88 y.o. female admitted 05/13/22 for R TKA. PMH: HTN, bradycardia.    PT Comments    Pt reports pain is tolerable with activity and able to progress ambulation distance.  Pt also performed one step both forwards and backwards with RW.  Pt reviewed HEP handout and demonstrated each exercises once.  Pt had no further questions and feels ready for d/c home today.  Pt agreeable to have her friend close by for mobility initially for safety upon d/c.    Recommendations for follow up therapy are one component of a multi-disciplinary discharge planning process, led by the attending physician.  Recommendations may be updated based on patient status, additional functional criteria and insurance authorization.  Follow Up Recommendations  Follow physician's recommendations for discharge plan and follow up therapies     Assistance Recommended at Discharge Intermittent Supervision/Assistance  Patient can return home with the following A little help with walking and/or transfers;A little help with bathing/dressing/bathroom;Assistance with cooking/housework;Assist for transportation;Help with stairs or ramp for entrance   Equipment Recommendations  None recommended by PT    Recommendations for Other Services       Precautions / Restrictions Precautions Precautions: Knee;Fall Restrictions RLE Weight Bearing: Weight bearing as tolerated     Mobility  Bed Mobility Overal bed mobility: Needs Assistance Bed Mobility: Supine to Sit     Supine to sit: Supervision, HOB elevated Sit to supine: Supervision, HOB elevated   General bed mobility comments: pt utilized gait belt for self assist    Transfers Overall transfer level: Needs assistance Equipment used: Rolling walker (2 wheels) Transfers: Sit to/from Stand Sit to Stand: Supervision            General transfer comment: pt able to recall safe technique    Ambulation/Gait Ambulation/Gait assistance: Min guard, Supervision Gait Distance (Feet): 120 Feet Assistive device: Rolling walker (2 wheels) Gait Pattern/deviations: Step-to pattern, Decreased weight shift to right, Trunk flexed Gait velocity: decreased     General Gait Details: cues for sequence, RW positioning, step length, posture   Stairs Stairs: Yes Stairs assistance: Min guard, Supervision Stair Management: Step to pattern, Backwards, Forwards, With walker Number of Stairs: 1 General stair comments: verbal cues for safety and sequence; pt has step up into shower per her friend so performed one step both forwards and backwards however pt does plan on "bird bath" for a few days for safety upon d/c.   Wheelchair Mobility    Modified Rankin (Stroke Patients Only)       Balance                                            Cognition Arousal/Alertness: Awake/alert Behavior During Therapy: WFL for tasks assessed/performed Overall Cognitive Status: Within Functional Limits for tasks assessed                                          Exercises      General Comments        Pertinent Vitals/Pain Pain Assessment Pain Assessment: 0-10 Pain Score: 5  Pain Location: R knee with movement Pain Descriptors / Indicators: Operative site guarding, Aching, Sore Pain Intervention(s):  Monitored during session, Repositioned    Home Living                          Prior Function            PT Goals (current goals can now be found in the care plan section) Progress towards PT goals: Progressing toward goals    Frequency    7X/week      PT Plan Current plan remains appropriate    Co-evaluation              AM-PAC PT "6 Clicks" Mobility   Outcome Measure  Help needed turning from your back to your side while in a flat bed without using bedrails?:  A Little Help needed moving from lying on your back to sitting on the side of a flat bed without using bedrails?: A Little Help needed moving to and from a bed to a chair (including a wheelchair)?: A Little Help needed standing up from a chair using your arms (e.g., wheelchair or bedside chair)?: A Little Help needed to walk in hospital room?: A Little Help needed climbing 3-5 steps with a railing? : A Little 6 Click Score: 18    End of Session Equipment Utilized During Treatment: Gait belt Activity Tolerance: Patient tolerated treatment well Patient left: with call bell/phone within reach;in chair Nurse Communication: Other (comment) (called ortho tech to apply CPM per pt request) PT Visit Diagnosis: Difficulty in walking, not elsewhere classified (R26.2)     Time: 8185-6314 PT Time Calculation (min) (ACUTE ONLY): 18 min  Charges:  $Gait Training: 8-22 mins                    Arlyce Dice, DPT Physical Therapist Acute Rehabilitation Services Preferred contact method: Secure Chat Weekend Pager Only: [phone] Office: [phone]    Myrtis Hopping [place] 05/18/2022, 2:59 PM

## 2022-05-18 NOTE — Discharge Summary (Signed)
Discharge Summary  Patient ID: Samantha Henderson MRN: [id number] DOB/AGE: 06/02/1934 88 y.o.  Admit date: 05/13/2022 Discharge date: 05/18/2022  Admission Diagnoses:  S/P total knee arthroplasty, right  Discharge Diagnoses:  Principal Problem:   S/P total knee arthroplasty, right Active Problems:   S/P total knee replacement, right   Past Medical History:  Diagnosis Date   Abdominal bruit    Abdominal bruit in the past no aortic aneurysm by evaluation in the past   Aortic regurgitation    Moderate by echo 05/2016   Arm weakness    Arm weakness with exercise, January, 2012   Arthritis    Benign essential HTN 01/27/2017   Bradycardia    Carotid stenosis    1-39% bilateral by dopplers 09/2019   Constipation    Constipation with diltiazem March, 2563   Diastolic dysfunction    Mild   Dizziness    Dysrhythmia    Ejection fraction 2011   55-60% ejection fraction, echo, January, 2012   HTN (hypertension)    Hypothyroidism    Leg cramps    from lipitor in the past   Mitral regurgitation    echo..04/2007  /  mild..04/2010   Mitral regurgitation 11/15/2018   Mild to moderate by echo 09/2018   PAC (premature atrial contraction)    Palpitation    Pericardial effusion    small...posterior...echo..04/2007   Polymyalgia ([place])    Pulmonary hypertension (HCC)    Rheumatic fever    Questionable history of rheumatic fever, but no history of valvular abnormalities   Sinus tachycardia    Temporal arteritis (HCC)    Rule out temporal arteritis in the past   Tricuspid regurgitation    -moderate...echo..04/2007...Marland KitchenMarland KitchenEF  60%..echo..04/2007  /   55-60%....echo..05/21/2010   Varicose veins of both lower extremities     Surgeries: Procedure(s): TOTAL KNEE ARTHROPLASTY on 05/13/2022   Consultants (if any):   Discharged Condition: Improved  Hospital Course: Samantha Henderson is an 88 y.o. female who was admitted 05/13/2022 with a diagnosis of S/P total knee arthroplasty, right and went to the operating  room on 05/13/2022 and underwent the above named procedures.    She was given perioperative antibiotics:  Anti-infectives (From admission, onward)    Start     Dose/Rate Route Frequency Ordered Stop   05/13/22 2100  ceFAZolin (ANCEF) IVPB 2g/100 mL premix        2 g 200 mL/hr over 30 Minutes Intravenous Every 6 hours 05/13/22 1829 05/14/22 0320   05/13/22 1130  vancomycin (VANCOCIN) IVPB 1000 mg/200 mL premix        1,000 mg 200 mL/hr over 60 Minutes Intravenous On call to O.R. 05/13/22 1121 05/13/22 1421     .  She was given sequential compression devices, early ambulation, and Eliquis for DVT prophylaxis.  She benefited maximally from the hospital stay and there were no complications.    Recent vital signs:  Vitals:   05/18/22 0919 05/18/22 1439  BP: (!) 128/58 (!) 116/58  Pulse: 64 72  Resp:  16  Temp:  98.6 F (37 C)  SpO2:  96%    Recent laboratory studies:  Lab Results  Component Value Date   HGB 11.4 (L) 05/15/2022   HGB 11.3 (L) 05/14/2022   HGB 13.1 05/05/2022   Lab Results  Component Value Date   WBC 10.9 (H) 05/15/2022   PLT 181 05/15/2022   Lab Results  Component Value Date   INR 1.23 04/12/2016   Lab Results  Component  Value Date   NA 138 05/14/2022   K 3.7 05/14/2022   CL 107 05/14/2022   CO2 24 05/14/2022   BUN 15 05/14/2022   CREATININE 0.96 05/14/2022   GLUCOSE 127 (H) 05/14/2022    Discharge Medications:   Allergies as of 05/18/2022       Reactions   Niacin And Related Palpitations   Amlodipine Swelling   Cefdinir Swelling   Patient tolerated Ancef on 05/13/2022   Celecoxib Rash   Codeine Nausea And Vomiting   Nebivolol Hcl Swelling   Pregabalin Swelling   Ciprofloxacin Swelling   Hives  Tongue Sweeling   Lisinopril Cough   Plaquenil [hydroxychloroquine] Other (See Comments)   Low platelets   Sulfamethoxazole-trimethoprim    Rash   Augmentin [amoxicillin-pot Clavulanate] Rash   Cephalexin Rash   Patient tolerated Ancef on  05/13/2022   Sulfonamide Derivatives Rash   Rash        Medication List     STOP taking these medications    acetaminophen 650 MG CR tablet Commonly known as: TYLENOL   traMADol 50 MG tablet Commonly known as: ULTRAM       TAKE these medications    apixaban 2.5 MG Tabs tablet Commonly known as: Eliquis Take 1 tablet (2.5 mg total) by mouth 2 (two) times daily.   atorvastatin 10 MG tablet Commonly known as: LIPITOR Take by mouth daily.   Biotin 10 MG Caps Take 10 mg by mouth daily.   calcium carbonate 750 MG chewable tablet Commonly known as: TUMS EX Chew 1 tablet by mouth daily as needed for heartburn.   diltiazem 120 MG 24 hr capsule Commonly known as: CARDIZEM CD Take 1 capsule (120 mg total) by mouth daily.   diltiazem 30 MG tablet Commonly known as: Cardizem Take 1 tablet (30 mg total) by mouth daily as needed. For breakthrough atrial fibrillation.   diphenhydrAMINE 50 MG tablet Commonly known as: BENADRYL Take 50 mg by mouth at bedtime.   Fish Oil 1000 MG Caps Take 1,000 mg by mouth daily.   hydrochlorothiazide 12.5 MG capsule Commonly known as: MICROZIDE Take 1 capsule (12.5 mg total) by mouth daily.   HYDROcodone-acetaminophen 10-325 MG tablet Commonly known as: Norco Take 1 tablet by mouth every 4 (four) hours as needed.   levothyroxine 100 MCG tablet Commonly known as: SYNTHROID Take 1 tablet (100 mcg total) by mouth daily.   Magnesium 250 MG Tabs Take 250 mg by mouth daily.   meclizine 12.5 MG tablet Commonly known as: ANTIVERT Take 12.5 mg by mouth 3 (three) times daily as needed.   Melatonin 10 MG Tabs Take 10 mg by mouth at bedtime.   ondansetron 4 MG tablet Commonly known as: ZOFRAN Take 4 mg by mouth every 8 (eight) hours as needed for nausea or vomiting. What changed: Another medication with the same name was added. Make sure you understand how and when to take each.   ondansetron 4 MG tablet Commonly known as:  Zofran Take 1 tablet (4 mg total) by mouth every 8 (eight) hours as needed for nausea or vomiting. What changed: You were already taking a medication with the same name, and this prescription was added. Make sure you understand how and when to take each.   polyethylene glycol 17 g packet Commonly known as: MIRALAX / GLYCOLAX Take 17 g by mouth daily.   sennosides-docusate sodium 8.6-50 MG tablet Commonly known as: SENOKOT-S Take 2 tablets by mouth daily.   traZODone 50 MG tablet  Commonly known as: DESYREL Take 12.5 mg by mouth at bedtime as needed for sleep.   TURMERIC PO Take 1,000 mg by mouth daily.   Vitamin D3 125 MCG (5000 UT) Caps Take 1 capsule by mouth daily.        Diagnostic Studies: DG Knee Right Port  Result Date: 05/13/2022 CLINICAL DATA:  Postop EXAM: PORTABLE RIGHT KNEE - 1-2 VIEW COMPARISON:  None Available. FINDINGS: Status post right knee replacement with intact hardware and normal alignment. Moderate gas in the soft tissues consistent with recent surgery. No fracture IMPRESSION: Status post right knee replacement with expected postoperative change. Electronically Signed   By: Donavan Foil M.D.   On: 05/13/2022 23:56    Disposition: Discharge disposition: 06-Home-Health Care Svc          Follow-up Information     Marchia Bond, MD. Go on 05/26/2022.   Specialty: Orthopedic Surgery Why: your appointment is scheduled for 11:00. Contact information: [address] [place] [address] [phone]         [facility] [place] Follow up.   Specialty: [place] Why: HHPT will provide 6 home visits prior to starting outpatient physical therapy Contact information: [address] [place] [address] [phone]         [facility], [place]. Go on 05/26/2022.   Why: your therapy appointment has been scheduled.  the office will call with your time Contact information: [facility] [place] [address] [phone]                  Signed: Jola Baptist 05/18/2022, 9:04 PM

## 2022-05-18 NOTE — Progress Notes (Signed)
     Subjective: 5 Days Post-Op s/p Procedure(s): TOTAL KNEE ARTHROPLASTY   Patient is alert, oriented. States throbbing at knee is worse, but nausea has improved. Denies chest pain, SOB, Calf pain. Really does not want to go to SNF due to family member having come down to care for her.      Objective:  PE: VITALS:   Vitals:   05/17/22 0806 05/17/22 1333 05/17/22 2057 05/18/22 0554  BP: (!) 119/56 (!) 111/57 131/68 (!) 116/56  Pulse: 64 63 60 62  Resp:  '17 18 18  '$ Temp:  97.8 F (36.6 C) 97.9 F (36.6 C) 97.9 F (36.6 C)  TempSrc:   Oral Oral  SpO2:  95% 96% 95%  Weight:      Height:        Sensation intact distally Intact pulses distally Dorsiflexion/Plantar flexion intact Incision: dressing C/D/I  LABS  No results found for this or any previous visit (from the past 24 hour(s)).   No results found.  Assessment/Plan: Principal Problem:   S/P total knee arthroplasty, right Active Problems:   S/P total knee replacement, right  5 Days Post-Op s/p Procedure(s): TOTAL KNEE ARTHROPLASTY - Continue CPM daily - able to tolerated 60 deg yesterday, first day was only 40  - Still struggling with movement, patient is adamant that she does not want to go to SNF, however currently not safe to leave hospital. Unable to do stair training, only has walked 20 feet max since surgery on Tuesday. We have started bed placement process. I will call caregiver Narda Rutherford today to speak with her regarding home care situation.   Weightbearing: WBAT RLE, up with therapy Insicional and dressing care: Reinforce dressings as needed VTE prophylaxis: Home eliquis restarted  Pain control: pain worse but less nausea than with oxycodone. Will add on 7.5 mg hydrocodone option as well today. Follow - up plan: 2 weeks with Dr. Mardelle Matte Dispo: pending further progress with PT  Contact information:   Merlene Pulling, PA-C Weekdays 8-5  After hours and holidays please check [email or web address] for group call  information for [facility]  Ventura Bruns 05/18/2022, 8:03 AM

## 2022-05-18 NOTE — Progress Notes (Signed)
The patient is alert and oriented and has been seen by her physician. The orders for discharge were written. IV has been removed. Went over discharge instructions with patient and family. She is being discharged via wheelchair with all of her belongings.  

## 2022-05-18 NOTE — TOC Transition Note (Signed)
Transition of Care [facility]) - CM/SW Discharge Note   Patient Details  Name: Samantha Henderson MRN: [id number] Date of Birth: 1935/01/23  [facility] CM/SW Contact:  Rodney Booze, LCSW Phone Number: 05/18/2022, 2:44 PM   Clinical Narrative:    Per last note patient was going to SNF, however the patient is now going home with home health. This CSW has verified that the patient has been set up with [facility]. They are aware that she will DC today. TOC is signing off.    Final next level of care: [place] Barriers to Discharge: SNF Pending bed offer, Insurance Authorization   Patient Goals and CMS Choice   Choice offered to / list presented to : Patient  Discharge Placement                         Discharge Plan and Services Additional resources added to the After Visit Summary for                  DME Arranged: N/A DME Agency: NA       HH Arranged: PT HH Agency: Licensed conveyancer spoke with at [place]: Prearranged in orthopedist's office  Social Determinants of Health ([place]) Interventions SDOH Screenings   Food Insecurity: No Food Insecurity (05/13/2022)  Housing: Low Risk  (05/13/2022)  Transportation Needs: No Transportation Needs (05/13/2022)  Utilities: Not At Risk (05/13/2022)  Tobacco Use: Low Risk  (05/14/2022)     Readmission Risk Interventions     No data to display

## 2022-05-18 NOTE — Progress Notes (Signed)
Physical Therapy Treatment Patient Details Name: Samantha Henderson MRN: [id number] DOB: 1934/06/05 Today's Date: 05/18/2022   History of Present Illness 88 y.o. female admitted 05/13/22 for R TKA. PMH: HTN, bradycardia.    PT Comments    Pt ambulated in hallway and able to progress ambulation distance.  Pt reports no dizziness and pain controlled at this time.  Pt also able to use bathroom with supervision/set up and cues for safety however no physical assist required.  Pt's friend that will be assisting her at home upon d/c was present during session.    Recommendations for follow up therapy are one component of a multi-disciplinary discharge planning process, led by the attending physician.  Recommendations may be updated based on patient status, additional functional criteria and insurance authorization.  Follow Up Recommendations  Follow physician's recommendations for discharge plan and follow up therapies     Assistance Recommended at Discharge Intermittent Supervision/Assistance  Patient can return home with the following A little help with walking and/or transfers;A little help with bathing/dressing/bathroom;Assistance with cooking/housework;Assist for transportation;Help with stairs or ramp for entrance   Equipment Recommendations  None recommended by PT    Recommendations for Other Services       Precautions / Restrictions Precautions Precautions: Knee;Fall Restrictions RLE Weight Bearing: Weight bearing as tolerated     Mobility  Bed Mobility Overal bed mobility: Needs Assistance Bed Mobility: Supine to Sit     Supine to sit: Supervision, HOB elevated Sit to supine: Supervision, HOB elevated   General bed mobility comments: pt utilized gait belt for self assist    Transfers Overall transfer level: Needs assistance Equipment used: Rolling walker (2 wheels) Transfers: Sit to/from Stand Sit to Stand: Min guard           General transfer comment: min/guard for  safety however no physical assist required, cues for UE and LE positioning; performed sit to stand from bed and BSC over toilet    Ambulation/Gait Ambulation/Gait assistance: Min guard Gait Distance (Feet): 60 Feet Assistive device: Rolling walker (2 wheels) Gait Pattern/deviations: Step-to pattern, Decreased weight shift to right, Trunk flexed Gait velocity: decreased     General Gait Details: cues for sequence, RW positioning, step length, posture   Stairs             Wheelchair Mobility    Modified Rankin (Stroke Patients Only)       Balance                                            Cognition Arousal/Alertness: Awake/alert Behavior During Therapy: WFL for tasks assessed/performed Overall Cognitive Status: Within Functional Limits for tasks assessed                                          Exercises      General Comments        Pertinent Vitals/Pain Pain Assessment Pain Assessment: 0-10 Pain Score: 3  Pain Location: R knee with movement Pain Descriptors / Indicators: Operative site guarding, Aching, Sore Pain Intervention(s): Repositioned, Monitored during session, Premedicated before session    Home Living                          Prior Function  PT Goals (current goals can now be found in the care plan section) Progress towards PT goals: Progressing toward goals    Frequency    7X/week      PT Plan Current plan remains appropriate    Co-evaluation              AM-PAC PT "6 Clicks" Mobility   Outcome Measure  Help needed turning from your back to your side while in a flat bed without using bedrails?: A Little Help needed moving from lying on your back to sitting on the side of a flat bed without using bedrails?: A Little Help needed moving to and from a bed to a chair (including a wheelchair)?: A Little Help needed standing up from a chair using your arms (e.g.,  wheelchair or bedside chair)?: A Little Help needed to walk in hospital room?: A Little Help needed climbing 3-5 steps with a railing? : A Lot 6 Click Score: 17    End of Session Equipment Utilized During Treatment: Gait belt Activity Tolerance: Patient tolerated treatment well Patient left: in bed;with call bell/phone within reach Nurse Communication: Other (comment) (called ortho tech to apply CPM per pt request) PT Visit Diagnosis: Difficulty in walking, not elsewhere classified (R26.2)     Time: 7628-3151 PT Time Calculation (min) (ACUTE ONLY): 23 min  Charges:  $Gait Training: 23-37 mins                    Jannette Spanner PT, DPT Physical Therapist Acute Rehabilitation Services Preferred contact method: Secure Chat Weekend Pager Only: [phone] Office: [phone]   Myrtis Hopping [place] 05/18/2022, 2:55 PM

## 2022-05-18 NOTE — Plan of Care (Signed)
  Problem: Education: Goal: Knowledge of General Education information will improve Description Including pain rating scale, medication(s)/side effects and non-pharmacologic comfort measures Outcome: Progressing   

## 2022-05-19 DIAGNOSIS — Z471 Aftercare following joint replacement surgery: Secondary | ICD-10-CM | POA: Diagnosis not present

## 2022-05-19 DIAGNOSIS — M316 Other giant cell arteritis: Secondary | ICD-10-CM | POA: Diagnosis not present

## 2022-05-19 DIAGNOSIS — I491 Atrial premature depolarization: Secondary | ICD-10-CM | POA: Diagnosis not present

## 2022-05-19 DIAGNOSIS — Z9181 History of falling: Secondary | ICD-10-CM | POA: Diagnosis not present

## 2022-05-19 DIAGNOSIS — M353 Polymyalgia rheumatica: Secondary | ICD-10-CM | POA: Diagnosis not present

## 2022-05-19 DIAGNOSIS — M1712 Unilateral primary osteoarthritis, left knee: Secondary | ICD-10-CM | POA: Diagnosis not present

## 2022-05-19 DIAGNOSIS — I4819 Other persistent atrial fibrillation: Secondary | ICD-10-CM | POA: Diagnosis not present

## 2022-05-19 DIAGNOSIS — I8393 Asymptomatic varicose veins of bilateral lower extremities: Secondary | ICD-10-CM | POA: Diagnosis not present

## 2022-05-19 DIAGNOSIS — Z7901 Long term (current) use of anticoagulants: Secondary | ICD-10-CM | POA: Diagnosis not present

## 2022-05-19 DIAGNOSIS — I119 Hypertensive heart disease without heart failure: Secondary | ICD-10-CM | POA: Diagnosis not present

## 2022-05-19 DIAGNOSIS — I081 Rheumatic disorders of both mitral and tricuspid valves: Secondary | ICD-10-CM | POA: Diagnosis not present

## 2022-05-19 DIAGNOSIS — I2510 Atherosclerotic heart disease of native coronary artery without angina pectoris: Secondary | ICD-10-CM | POA: Diagnosis not present

## 2022-05-19 DIAGNOSIS — Z96651 Presence of right artificial knee joint: Secondary | ICD-10-CM | POA: Diagnosis not present

## 2022-05-19 DIAGNOSIS — I6523 Occlusion and stenosis of bilateral carotid arteries: Secondary | ICD-10-CM | POA: Diagnosis not present

## 2022-05-19 DIAGNOSIS — E039 Hypothyroidism, unspecified: Secondary | ICD-10-CM | POA: Diagnosis not present

## 2022-05-22 DIAGNOSIS — I2510 Atherosclerotic heart disease of native coronary artery without angina pectoris: Secondary | ICD-10-CM | POA: Diagnosis not present

## 2022-05-22 DIAGNOSIS — M1712 Unilateral primary osteoarthritis, left knee: Secondary | ICD-10-CM | POA: Diagnosis not present

## 2022-05-22 DIAGNOSIS — Z96651 Presence of right artificial knee joint: Secondary | ICD-10-CM | POA: Diagnosis not present

## 2022-05-22 DIAGNOSIS — I8393 Asymptomatic varicose veins of bilateral lower extremities: Secondary | ICD-10-CM | POA: Diagnosis not present

## 2022-05-22 DIAGNOSIS — Z7901 Long term (current) use of anticoagulants: Secondary | ICD-10-CM | POA: Diagnosis not present

## 2022-05-22 DIAGNOSIS — Z9181 History of falling: Secondary | ICD-10-CM | POA: Diagnosis not present

## 2022-05-22 DIAGNOSIS — I6523 Occlusion and stenosis of bilateral carotid arteries: Secondary | ICD-10-CM | POA: Diagnosis not present

## 2022-05-22 DIAGNOSIS — I119 Hypertensive heart disease without heart failure: Secondary | ICD-10-CM | POA: Diagnosis not present

## 2022-05-22 DIAGNOSIS — I4819 Other persistent atrial fibrillation: Secondary | ICD-10-CM | POA: Diagnosis not present

## 2022-05-22 DIAGNOSIS — M353 Polymyalgia rheumatica: Secondary | ICD-10-CM | POA: Diagnosis not present

## 2022-05-22 DIAGNOSIS — I491 Atrial premature depolarization: Secondary | ICD-10-CM | POA: Diagnosis not present

## 2022-05-22 DIAGNOSIS — E039 Hypothyroidism, unspecified: Secondary | ICD-10-CM | POA: Diagnosis not present

## 2022-05-22 DIAGNOSIS — M316 Other giant cell arteritis: Secondary | ICD-10-CM | POA: Diagnosis not present

## 2022-05-22 DIAGNOSIS — Z471 Aftercare following joint replacement surgery: Secondary | ICD-10-CM | POA: Diagnosis not present

## 2022-05-22 DIAGNOSIS — I081 Rheumatic disorders of both mitral and tricuspid valves: Secondary | ICD-10-CM | POA: Diagnosis not present

## 2022-05-23 ENCOUNTER — Other Ambulatory Visit: Payer: Self-pay

## 2022-05-23 ENCOUNTER — Encounter (HOSPITAL_COMMUNITY): Payer: Self-pay | Admitting: *Deleted

## 2022-05-23 ENCOUNTER — Emergency Department (HOSPITAL_COMMUNITY)
Admission: EM | Admit: 2022-05-23 | Discharge: 2022-05-23 | Disposition: A | Discharge status: Home / Self Care | Payer: Medicare HMO | Attending: Emergency Medicine | Admitting: Emergency Medicine

## 2022-05-23 DIAGNOSIS — Z7901 Long term (current) use of anticoagulants: Secondary | ICD-10-CM | POA: Diagnosis not present

## 2022-05-23 DIAGNOSIS — R112 Nausea with vomiting, unspecified: Secondary | ICD-10-CM

## 2022-05-23 DIAGNOSIS — N3000 Acute cystitis without hematuria: Secondary | ICD-10-CM | POA: Diagnosis not present

## 2022-05-23 DIAGNOSIS — M25461 Effusion, right knee: Secondary | ICD-10-CM | POA: Diagnosis not present

## 2022-05-23 DIAGNOSIS — Z96651 Presence of right artificial knee joint: Secondary | ICD-10-CM | POA: Insufficient documentation

## 2022-05-23 DIAGNOSIS — R000 Tachycardia, unspecified: Secondary | ICD-10-CM | POA: Diagnosis not present

## 2022-05-23 LAB — URINALYSIS, ROUTINE W REFLEX MICROSCOPIC
Bilirubin Urine: NEGATIVE
Glucose, UA: NEGATIVE mg/dL
Hgb urine dipstick: NEGATIVE
Ketones, ur: 20 mg/dL — AB
Nitrite: NEGATIVE
Protein, ur: NEGATIVE mg/dL
Specific Gravity, Urine: 1.018 (ref 1.005–1.030)
pH: 7 (ref 5.0–8.0)

## 2022-05-23 LAB — LIPASE, BLOOD: Lipase: 25 U/L (ref 11–51)

## 2022-05-23 LAB — COMPREHENSIVE METABOLIC PANEL
ALT: 38 U/L (ref 0–44)
AST: 48 U/L — ABNORMAL HIGH (ref 15–41)
Albumin: 3.9 g/dL (ref 3.5–5.0)
Alkaline Phosphatase: 55 U/L (ref 38–126)
Anion gap: 9 (ref 5–15)
BUN: 24 mg/dL — ABNORMAL HIGH (ref 8–23)
CO2: 27 mmol/L (ref 22–32)
Calcium: 9.3 mg/dL (ref 8.9–10.3)
Chloride: 98 mmol/L (ref 98–111)
Creatinine, Ser: 1.09 mg/dL — ABNORMAL HIGH (ref 0.44–1.00)
GFR, Estimated: 50 mL/min — ABNORMAL LOW (ref >60)
Glucose, Bld: 115 mg/dL — ABNORMAL HIGH (ref 70–99)
Potassium: 3.1 mmol/L — ABNORMAL LOW (ref 3.5–5.1)
Sodium: 134 mmol/L — ABNORMAL LOW (ref 135–145)
Total Bilirubin: 1 mg/dL (ref 0.3–1.2)
Total Protein: 7 g/dL (ref 6.5–8.1)

## 2022-05-23 LAB — CBC WITH DIFFERENTIAL/PLATELET
Abs Immature Granulocytes: 0.02 10*3/uL (ref 0.00–0.07)
Basophils Absolute: 0 10*3/uL (ref 0.0–0.1)
Basophils Relative: 1 %
Eosinophils Absolute: 0.1 10*3/uL (ref 0.0–0.5)
Eosinophils Relative: 1 %
HCT: 41.4 % (ref 36.0–46.0)
Hemoglobin: 13.2 g/dL (ref 12.0–15.0)
Immature Granulocytes: 0 %
Lymphocytes Relative: 18 %
Lymphs Abs: 1.1 10*3/uL (ref 0.7–4.0)
MCH: 29.1 pg (ref 26.0–34.0)
MCHC: 31.9 g/dL (ref 30.0–36.0)
MCV: 91.4 fL (ref 80.0–100.0)
Monocytes Absolute: 0.5 10*3/uL (ref 0.1–1.0)
Monocytes Relative: 8 %
Neutro Abs: 4.5 10*3/uL (ref 1.7–7.7)
Neutrophils Relative %: 72 %
Platelets: 263 10*3/uL (ref 150–400)
RBC: 4.53 MIL/uL (ref 3.87–5.11)
RDW: 12.9 % (ref 11.5–15.5)
WBC: 6.2 10*3/uL (ref 4.0–10.5)
nRBC: 0 % (ref 0.0–0.2)

## 2022-05-23 MED ORDER — NITROFURANTOIN MONOHYD MACRO 100 MG PO CAPS
100.0000 mg | ORAL_CAPSULE | Freq: Once | ORAL | Status: AC
  Administered 2022-05-23: 100 mg via ORAL
  Filled 2022-05-23: qty 1

## 2022-05-23 MED ORDER — SODIUM CHLORIDE 0.9 % IV BOLUS
1000.0000 mL | Freq: Once | INTRAVENOUS | Status: AC
  Administered 2022-05-23: 1000 mL via INTRAVENOUS

## 2022-05-23 MED ORDER — ONDANSETRON 8 MG PO TBDP: 8.0000 mg | ORAL_TABLET | Freq: Three times a day (TID) | ORAL | 0 refills | Status: DC | PRN

## 2022-05-23 MED ORDER — NITROFURANTOIN MONOHYD MACRO 100 MG PO CAPS: 100.0000 mg | ORAL_CAPSULE | Freq: Two times a day (BID) | ORAL | 0 refills | Status: DC

## 2022-05-23 MED ORDER — MORPHINE SULFATE (PF) 2 MG/ML IV SOLN
2.0000 mg | Freq: Once | INTRAVENOUS | Status: AC
  Administered 2022-05-23: 2 mg via INTRAVENOUS
  Filled 2022-05-23: qty 1

## 2022-05-23 MED ORDER — POTASSIUM CHLORIDE CRYS ER 20 MEQ PO TBCR
40.0000 meq | EXTENDED_RELEASE_TABLET | Freq: Once | ORAL | Status: AC
  Administered 2022-05-23: 40 meq via ORAL
  Filled 2022-05-23: qty 2

## 2022-05-23 MED ORDER — ONDANSETRON HCL 4 MG/2ML IJ SOLN
4.0000 mg | Freq: Once | INTRAMUSCULAR | Status: AC
  Administered 2022-05-23: 4 mg via INTRAVENOUS
  Filled 2022-05-23: qty 2

## 2022-05-23 MED ORDER — ACETAMINOPHEN 500 MG PO TABS
1000.0000 mg | ORAL_TABLET | Freq: Once | ORAL | Status: AC
  Administered 2022-05-23: 1000 mg via ORAL
  Filled 2022-05-23: qty 2

## 2022-05-23 MED ORDER — PROMETHAZINE HCL 25 MG PO TABS
12.5000 mg | ORAL_TABLET | Freq: Once | ORAL | Status: AC
  Administered 2022-05-23: 12.5 mg via ORAL
  Filled 2022-05-23: qty 1

## 2022-05-23 NOTE — Progress Notes (Signed)
Patient well-known to me, status post recent knee replacement, she has been effectively failing to thrive at home.  We have been trying to manage her fluids and oral intake and pain control without much success.  Ultimately the patient is coming to the emergency room for evaluation.  Her laboratory studies look reasonably good, she is not severely dehydrated from what I can tell, I will defer to the emergency room physicians and the medical service as to the best course of the action whether or not she needs admission or not.  My partners will be on-call over the weekend (Dr. Zachery Dakins), please call if questions or needs arise.  Marchia Bond, MD

## 2022-05-23 NOTE — Discharge Instructions (Addendum)
We saw you in the ER for nausea, vomiting, dehydration. All the results in the ER are normal, labs and imaging.  There was evidence of mild dehydration, you have received IV fluids in the ER. There is a concern that he could be having a UTI, therefore prescription for it has been provided.  The workup in the ER is not complete, and is limited to screening for life threatening and emergent conditions only, so please see a primary care doctor for further evaluation.  Please return to the ER if your symptoms worsen; you have increased pain, fevers, chills, inability to keep any medications down, confusion. Otherwise see the outpatient doctor as requested.

## 2022-05-23 NOTE — ED Triage Notes (Signed)
BIB family from home for NV, describes as NV continues since recent hospitalization for knee surgery-Dr. Mardelle Matte. C/o NV, and general weakness. Denies fever, diarrhea, sob, or syncope. Describes knee pain as expected 8/10. Took usual meds and tramadol this am, and believes she kept them down. Was unable to keep down eggs, ginger-ale and antiemetic. Alert, NAD, calm, interactive.

## 2022-05-23 NOTE — ED Provider Notes (Addendum)
  Physical Exam  BP (!) 147/72   Pulse 71   Temp 98 F (36.7 C) (Oral)   Resp 18   Wt 54.9 kg   SpO2 99%   BMI 19.77 kg/m   Physical Exam  Procedures  Procedures  ED Course / MDM    Medical Decision Making Risk OTC drugs. Prescription drug management.   Pt comes in with cc of generalized weakness, persistent nausea w/ emesis. Recent knee surgery. Knee eval look fine, no signs of infection.   Po challenge initiated. Abd exam is reassuring. UA pending.  Dispo per reassessment.   Varney Biles, MD 05/23/22 1617  6:46 PM Patient reassessed and she states that she feels nauseous still.  However she is not vomiting.  Her labs are showing mild hypokalemia.  She is complaining of burning with urination, without any flank pain.  Burning with urination for the last 2 days.  She is allergic to several antibiotics.  Unfortunately we will give her Macrobid right now.  No urine cultures in our system to help, but she does not have history of recurrent UTI or MDR UTI.  Patient thinks that she will benefit with some more fluid.  I will order another liter of fluid, we will continue with oral challenge via oral Zofran and Macrobid.  Anticipate discharge at this point.   Varney Biles, MD 05/23/22 1847   8:32 PM Pt reassessed. Pt's VSS and WNL. Pt's cap refill < 3 seconds. Pt has been hydrated in the ER and now passed po challenge. We will discharge with antiemetic. Strict ER return precautions have been discussed and pt will return if he is unable to tolerate fluids and symptoms are getting worse.    Varney Biles, MD 05/23/22 2032

## 2022-05-23 NOTE — ED Provider Notes (Signed)
[place] EMERGENCY DEPARTMENT AT [facility] Provider Note   CSN: [id number] Arrival date & time: 05/23/22  1209     History  Chief Complaint  Patient presents with   Emesis    Samantha Henderson is a 88 y.o. female.  88 yo F with a chief complaints of intractable nausea and vomiting.  Going on for about a week or so now.  She had a right knee replacement done by Dr. Mardelle Matte.  Feels like the knee actually has been doing pretty well but has had trouble eating and drinking.  She has been trying her Zofran at home but without improvement.  Initially was thought to be due to her pain medicine and she changed to a different one and had similar symptoms with the replacement and is now taking her tramadol as needed for pain.  She denies any abdominal pain denies fevers.   Emesis      Home Medications Prior to Admission medications   Medication Sig Start Date End Date Taking? Authorizing Provider  apixaban (ELIQUIS) 2.5 MG TABS tablet Take 1 tablet (2.5 mg total) by mouth 2 (two) times daily. 01/21/22   Imogene Burn, PA-C  atorvastatin (LIPITOR) 10 MG tablet Take by mouth daily.     [provider]  Biotin 10 MG CAPS Take 10 mg by mouth daily.    [provider]  calcium carbonate (TUMS EX) 750 MG chewable tablet Chew 1 tablet by mouth daily as needed for heartburn.    [provider]  Cholecalciferol (VITAMIN D3) 125 MCG (5000 UT) CAPS Take 1 capsule by mouth daily.     [provider]  diltiazem (CARDIZEM CD) 120 MG 24 hr capsule Take 1 capsule (120 mg total) by mouth daily. 01/21/22   Imogene Burn, PA-C  diltiazem (CARDIZEM) 30 MG tablet Take 1 tablet (30 mg total) by mouth daily as needed. For breakthrough atrial fibrillation. 07/17/21   Sueanne Margarita, MD  diphenhydrAMINE (BENADRYL) 50 MG tablet Take 50 mg by mouth at bedtime.    [provider]  hydrochlorothiazide (MICROZIDE) 12.5 MG capsule Take 1 capsule (12.5 mg total) by  mouth daily. 01/21/22   Imogene Burn, PA-C  HYDROcodone-acetaminophen (NORCO) 10-325 MG tablet Take 1 tablet by mouth every 4 (four) hours as needed. 05/18/22   Ventura Bruns, PA-C  levothyroxine (SYNTHROID) 100 MCG tablet Take 1 tablet (100 mcg total) by mouth daily. 01/24/22   Shamleffer, Melanie Crazier, MD  Magnesium 250 MG TABS Take 250 mg by mouth daily.    [provider]  meclizine (ANTIVERT) 12.5 MG tablet Take 12.5 mg by mouth 3 (three) times daily as needed. 12/05/20   [provider]  Melatonin 10 MG TABS Take 10 mg by mouth at bedtime.    [provider]  Omega-3 Fatty Acids (FISH OIL) 1000 MG CAPS Take 1,000 mg by mouth daily.    [provider]  ondansetron (ZOFRAN) 4 MG tablet Take 4 mg by mouth every 8 (eight) hours as needed for nausea or vomiting.    [provider]  ondansetron (ZOFRAN) 4 MG tablet Take 1 tablet (4 mg total) by mouth every 8 (eight) hours as needed for nausea or vomiting. 05/18/22   Merlene Pulling K, PA-C  polyethylene glycol (MIRALAX / GLYCOLAX) packet Take 17 g by mouth daily.    [provider]  sennosides-docusate sodium (SENOKOT-S) 8.6-50 MG tablet Take 2 tablets by mouth daily. 05/18/22   Owens Shark,  Ciro Backer, PA-C  traZODone (DESYREL) 50 MG tablet Take 12.5 mg by mouth at bedtime as needed for sleep.    [provider]  TURMERIC PO Take 1,000 mg by mouth daily.    [provider]      Allergies    Niacin and related, Amlodipine, Cefdinir, Celecoxib, Codeine, Nebivolol hcl, Pregabalin, Ciprofloxacin, Lisinopril, Plaquenil [hydroxychloroquine], Sulfamethoxazole-trimethoprim, Augmentin [amoxicillin-pot clavulanate], Cephalexin, and Sulfonamide derivatives    Review of Systems   Review of Systems  Gastrointestinal:  Positive for vomiting.    Physical Exam Updated Vital Signs BP (!) 147/72   Pulse 71   Temp 98 F (36.7 C) (Oral)   Resp 18   Wt 54.9 kg   SpO2 99%   BMI 19.77 kg/m   Physical Exam Vitals and nursing note reviewed.  Constitutional:      General: She is not in acute distress.    Appearance: She is well-developed. She is not diaphoretic.  HENT:     Head: Normocephalic and atraumatic.  Eyes:     Pupils: Pupils are equal, round, and reactive to light.  Cardiovascular:     Rate and Rhythm: Normal rate and regular rhythm.     Heart sounds: No murmur heard.    No friction rub. No gallop.  Pulmonary:     Effort: Pulmonary effort is normal.     Breath sounds: No wheezing or rales.  Abdominal:     General: There is no distension.     Palpations: Abdomen is soft.     Tenderness: There is no abdominal tenderness.  Musculoskeletal:        General: No tenderness.     Cervical back: Normal range of motion and neck supple.     Comments: Small effusion to the right knee.  No erythema no warmth able to range with minimal discomfort.  Skin:    General: Skin is warm and dry.  Neurological:     Mental Status: She is alert and oriented to person, place, and time.  Psychiatric:        Behavior: Behavior normal.     ED Results / Procedures / Treatments   Labs (all labs ordered are listed, but only abnormal results are displayed) Labs Reviewed  COMPREHENSIVE METABOLIC PANEL - Abnormal; Notable for the following components:      Result Value   Sodium 134 (*)    Potassium 3.1 (*)    Glucose, Bld 115 (*)    BUN 24 (*)    Creatinine, Ser 1.09 (*)    AST 48 (*)    GFR, Estimated 50 (*)    All other components within normal limits  CBC WITH DIFFERENTIAL/PLATELET  LIPASE, BLOOD  URINALYSIS, ROUTINE W REFLEX MICROSCOPIC    EKG EKG Interpretation  Date/Time:  Friday May 23 2022 13:16:25 EST Ventricular Rate:  92 PR Interval:  146 QRS Duration: 90 QT Interval:  396 QTC Calculation: 490 R Axis:   74 Text Interpretation: Unknown rhythm, irregular rate Borderline T abnormalities, anterior leads Borderline prolonged QT interval No significant change  since last tracing Confirmed by Deno Etienne [id number]) on 05/23/2022 1:52:28 PM  Radiology No results found.  Procedures Procedures    Medications Ordered in ED Medications  promethazine (PHENERGAN) tablet 12.5 mg (has no administration in time range)  morphine (PF) 2 MG/ML injection 2 mg (2 mg Intravenous Given 05/23/22 1441)  ondansetron (ZOFRAN) injection 4 mg (4 mg Intravenous Given 05/23/22 1441)  sodium chloride 0.9 % bolus 1,000 mL (1,000  mLs Intravenous New Bag/Given 05/23/22 1441)    ED Course/ Medical Decision Making/ A&P                             Medical Decision Making Risk Prescription drug management.   88 yo F with a chief complaints of recurrent nausea and vomiting.  Going on for over a week now.  By history this sounds like nausea vomiting secondary to opioid use.  She had just had a right knee replaced.  Does not clinically look infected.  No leukocytosis no anemia, no significant electrolyte abnormality.  She is feeling a bit better after IV meds.  Will give an oral trial.  She was also concerned she might have a UTI and the urine is pending.  Patient care was signed out to Dr. Kathrynn Humble, please see his note for further details care in the ED.  The patients results and plan were reviewed and discussed.   Any x-rays performed were independently reviewed by myself.   Differential diagnosis were considered with the presenting HPI.  Medications  promethazine (PHENERGAN) tablet 12.5 mg (has no administration in time range)  morphine (PF) 2 MG/ML injection 2 mg (2 mg Intravenous Given 05/23/22 1441)  ondansetron (ZOFRAN) injection 4 mg (4 mg Intravenous Given 05/23/22 1441)  sodium chloride 0.9 % bolus 1,000 mL (1,000 mLs Intravenous New Bag/Given 05/23/22 1441)    Vitals:   05/23/22 1217 05/23/22 1224 05/23/22 1530 05/23/22 1535  BP: 117/70  (!) 147/72   Pulse: 66  71   Resp: 16  18   Temp: 98.7 F (37.1 C)   98 F (36.7 C)  TempSrc: Oral   Oral  SpO2: 100%  99%    Weight:  54.9 kg      Final diagnoses:  Nausea and vomiting, unspecified vomiting type         Final Clinical Impression(s) / ED Diagnoses Final diagnoses:  Nausea and vomiting, unspecified vomiting type    Rx / DC Orders ED Discharge Orders     None         Deno Etienne, DO 05/23/22 1619

## 2022-05-23 NOTE — ED Provider Triage Note (Signed)
Emergency Medicine Provider Triage Evaluation Note  Samantha Henderson , a 88 y.o. female  was evaluated in triage.  Pt complains of generalized weakness and persistent nausea and vomiting ever since having a knee replacement on 1/23.  No fever or chills.  Denies diarrhea.  No chest pain or shortness of breath.  Review of Systems  Positive: N/V Negative: CP  Physical Exam  BP 117/70 (BP Location: Left Arm)   Pulse 66   Temp 98.7 F (37.1 C) (Oral)   Resp 16   Wt 54.9 kg   SpO2 100%   BMI 19.77 kg/m  Gen:   Awake, no distress   Resp:  Normal effort  MSK:   Moves extremities without difficulty  Other:    Medical Decision Making  Medically screening exam initiated at 1:12 PM.  Appropriate orders placed.  [name] was informed that the remainder of the evaluation will be completed by another provider, this initial triage assessment does not replace that evaluation, and the importance of remaining in the ED until their evaluation is complete.  Labs ordered   Suzy Bouchard, [place] 05/23/22 1313

## 2022-05-24 DIAGNOSIS — Z96651 Presence of right artificial knee joint: Secondary | ICD-10-CM | POA: Diagnosis not present

## 2022-05-24 DIAGNOSIS — M316 Other giant cell arteritis: Secondary | ICD-10-CM | POA: Diagnosis not present

## 2022-05-24 DIAGNOSIS — I081 Rheumatic disorders of both mitral and tricuspid valves: Secondary | ICD-10-CM | POA: Diagnosis not present

## 2022-05-24 DIAGNOSIS — I4819 Other persistent atrial fibrillation: Secondary | ICD-10-CM | POA: Diagnosis not present

## 2022-05-24 DIAGNOSIS — I119 Hypertensive heart disease without heart failure: Secondary | ICD-10-CM | POA: Diagnosis not present

## 2022-05-24 DIAGNOSIS — I8393 Asymptomatic varicose veins of bilateral lower extremities: Secondary | ICD-10-CM | POA: Diagnosis not present

## 2022-05-24 DIAGNOSIS — I491 Atrial premature depolarization: Secondary | ICD-10-CM | POA: Diagnosis not present

## 2022-05-24 DIAGNOSIS — I2510 Atherosclerotic heart disease of native coronary artery without angina pectoris: Secondary | ICD-10-CM | POA: Diagnosis not present

## 2022-05-24 DIAGNOSIS — Z9181 History of falling: Secondary | ICD-10-CM | POA: Diagnosis not present

## 2022-05-24 DIAGNOSIS — Z471 Aftercare following joint replacement surgery: Secondary | ICD-10-CM | POA: Diagnosis not present

## 2022-05-24 DIAGNOSIS — I6523 Occlusion and stenosis of bilateral carotid arteries: Secondary | ICD-10-CM | POA: Diagnosis not present

## 2022-05-24 DIAGNOSIS — M1712 Unilateral primary osteoarthritis, left knee: Secondary | ICD-10-CM | POA: Diagnosis not present

## 2022-05-24 DIAGNOSIS — E039 Hypothyroidism, unspecified: Secondary | ICD-10-CM | POA: Diagnosis not present

## 2022-05-24 DIAGNOSIS — Z7901 Long term (current) use of anticoagulants: Secondary | ICD-10-CM | POA: Diagnosis not present

## 2022-05-24 DIAGNOSIS — M353 Polymyalgia rheumatica: Secondary | ICD-10-CM | POA: Diagnosis not present

## 2022-05-26 DIAGNOSIS — M1711 Unilateral primary osteoarthritis, right knee: Secondary | ICD-10-CM | POA: Diagnosis not present

## 2022-05-27 DIAGNOSIS — E039 Hypothyroidism, unspecified: Secondary | ICD-10-CM | POA: Diagnosis not present

## 2022-05-27 DIAGNOSIS — M353 Polymyalgia rheumatica: Secondary | ICD-10-CM | POA: Diagnosis not present

## 2022-05-27 DIAGNOSIS — I2510 Atherosclerotic heart disease of native coronary artery without angina pectoris: Secondary | ICD-10-CM | POA: Diagnosis not present

## 2022-05-27 DIAGNOSIS — M069 Rheumatoid arthritis, unspecified: Secondary | ICD-10-CM | POA: Diagnosis not present

## 2022-05-27 DIAGNOSIS — I491 Atrial premature depolarization: Secondary | ICD-10-CM | POA: Diagnosis not present

## 2022-05-27 DIAGNOSIS — Z96651 Presence of right artificial knee joint: Secondary | ICD-10-CM | POA: Diagnosis not present

## 2022-05-27 DIAGNOSIS — I4819 Other persistent atrial fibrillation: Secondary | ICD-10-CM | POA: Diagnosis not present

## 2022-05-27 DIAGNOSIS — I10 Essential (primary) hypertension: Secondary | ICD-10-CM | POA: Diagnosis not present

## 2022-05-27 DIAGNOSIS — I119 Hypertensive heart disease without heart failure: Secondary | ICD-10-CM | POA: Diagnosis not present

## 2022-05-27 DIAGNOSIS — I081 Rheumatic disorders of both mitral and tricuspid valves: Secondary | ICD-10-CM | POA: Diagnosis not present

## 2022-05-27 DIAGNOSIS — M316 Other giant cell arteritis: Secondary | ICD-10-CM | POA: Diagnosis not present

## 2022-05-27 DIAGNOSIS — Z7901 Long term (current) use of anticoagulants: Secondary | ICD-10-CM | POA: Diagnosis not present

## 2022-05-27 DIAGNOSIS — Z471 Aftercare following joint replacement surgery: Secondary | ICD-10-CM | POA: Diagnosis not present

## 2022-05-27 DIAGNOSIS — I6523 Occlusion and stenosis of bilateral carotid arteries: Secondary | ICD-10-CM | POA: Diagnosis not present

## 2022-05-27 DIAGNOSIS — E785 Hyperlipidemia, unspecified: Secondary | ICD-10-CM | POA: Diagnosis not present

## 2022-05-27 DIAGNOSIS — N183 Chronic kidney disease, stage 3 unspecified: Secondary | ICD-10-CM | POA: Diagnosis not present

## 2022-05-27 DIAGNOSIS — M1712 Unilateral primary osteoarthritis, left knee: Secondary | ICD-10-CM | POA: Diagnosis not present

## 2022-05-27 DIAGNOSIS — K219 Gastro-esophageal reflux disease without esophagitis: Secondary | ICD-10-CM | POA: Diagnosis not present

## 2022-05-27 DIAGNOSIS — I8393 Asymptomatic varicose veins of bilateral lower extremities: Secondary | ICD-10-CM | POA: Diagnosis not present

## 2022-05-27 DIAGNOSIS — M8580 Other specified disorders of bone density and structure, unspecified site: Secondary | ICD-10-CM | POA: Diagnosis not present

## 2022-05-27 DIAGNOSIS — Z9181 History of falling: Secondary | ICD-10-CM | POA: Diagnosis not present

## 2022-05-29 ENCOUNTER — Encounter (HOSPITAL_COMMUNITY): Payer: Self-pay | Admitting: *Deleted

## 2022-05-29 DIAGNOSIS — I081 Rheumatic disorders of both mitral and tricuspid valves: Secondary | ICD-10-CM | POA: Diagnosis not present

## 2022-05-29 DIAGNOSIS — I6523 Occlusion and stenosis of bilateral carotid arteries: Secondary | ICD-10-CM | POA: Diagnosis not present

## 2022-05-29 DIAGNOSIS — I4819 Other persistent atrial fibrillation: Secondary | ICD-10-CM | POA: Diagnosis not present

## 2022-05-29 DIAGNOSIS — Z96651 Presence of right artificial knee joint: Secondary | ICD-10-CM | POA: Diagnosis not present

## 2022-05-29 DIAGNOSIS — M1712 Unilateral primary osteoarthritis, left knee: Secondary | ICD-10-CM | POA: Diagnosis not present

## 2022-05-29 DIAGNOSIS — I2510 Atherosclerotic heart disease of native coronary artery without angina pectoris: Secondary | ICD-10-CM | POA: Diagnosis not present

## 2022-05-29 DIAGNOSIS — Z7901 Long term (current) use of anticoagulants: Secondary | ICD-10-CM | POA: Diagnosis not present

## 2022-05-29 DIAGNOSIS — Z471 Aftercare following joint replacement surgery: Secondary | ICD-10-CM | POA: Diagnosis not present

## 2022-05-29 DIAGNOSIS — M316 Other giant cell arteritis: Secondary | ICD-10-CM | POA: Diagnosis not present

## 2022-05-29 DIAGNOSIS — I491 Atrial premature depolarization: Secondary | ICD-10-CM | POA: Diagnosis not present

## 2022-05-29 DIAGNOSIS — E039 Hypothyroidism, unspecified: Secondary | ICD-10-CM | POA: Diagnosis not present

## 2022-05-29 DIAGNOSIS — I8393 Asymptomatic varicose veins of bilateral lower extremities: Secondary | ICD-10-CM | POA: Diagnosis not present

## 2022-05-29 DIAGNOSIS — M353 Polymyalgia rheumatica: Secondary | ICD-10-CM | POA: Diagnosis not present

## 2022-05-29 DIAGNOSIS — Z9181 History of falling: Secondary | ICD-10-CM | POA: Diagnosis not present

## 2022-05-29 DIAGNOSIS — I119 Hypertensive heart disease without heart failure: Secondary | ICD-10-CM | POA: Diagnosis not present

## 2022-05-30 DIAGNOSIS — E039 Hypothyroidism, unspecified: Secondary | ICD-10-CM | POA: Diagnosis not present

## 2022-05-30 DIAGNOSIS — Z7901 Long term (current) use of anticoagulants: Secondary | ICD-10-CM | POA: Diagnosis not present

## 2022-05-30 DIAGNOSIS — I491 Atrial premature depolarization: Secondary | ICD-10-CM | POA: Diagnosis not present

## 2022-05-30 DIAGNOSIS — Z471 Aftercare following joint replacement surgery: Secondary | ICD-10-CM | POA: Diagnosis not present

## 2022-05-30 DIAGNOSIS — Z9181 History of falling: Secondary | ICD-10-CM | POA: Diagnosis not present

## 2022-05-30 DIAGNOSIS — I8393 Asymptomatic varicose veins of bilateral lower extremities: Secondary | ICD-10-CM | POA: Diagnosis not present

## 2022-05-30 DIAGNOSIS — M353 Polymyalgia rheumatica: Secondary | ICD-10-CM | POA: Diagnosis not present

## 2022-05-30 DIAGNOSIS — I6523 Occlusion and stenosis of bilateral carotid arteries: Secondary | ICD-10-CM | POA: Diagnosis not present

## 2022-05-30 DIAGNOSIS — I119 Hypertensive heart disease without heart failure: Secondary | ICD-10-CM | POA: Diagnosis not present

## 2022-05-30 DIAGNOSIS — Z96651 Presence of right artificial knee joint: Secondary | ICD-10-CM | POA: Diagnosis not present

## 2022-05-30 DIAGNOSIS — M316 Other giant cell arteritis: Secondary | ICD-10-CM | POA: Diagnosis not present

## 2022-05-30 DIAGNOSIS — I4819 Other persistent atrial fibrillation: Secondary | ICD-10-CM | POA: Diagnosis not present

## 2022-05-30 DIAGNOSIS — I2510 Atherosclerotic heart disease of native coronary artery without angina pectoris: Secondary | ICD-10-CM | POA: Diagnosis not present

## 2022-05-30 DIAGNOSIS — M1712 Unilateral primary osteoarthritis, left knee: Secondary | ICD-10-CM | POA: Diagnosis not present

## 2022-05-30 DIAGNOSIS — I081 Rheumatic disorders of both mitral and tricuspid valves: Secondary | ICD-10-CM | POA: Diagnosis not present

## 2022-05-31 DIAGNOSIS — M316 Other giant cell arteritis: Secondary | ICD-10-CM | POA: Diagnosis not present

## 2022-05-31 DIAGNOSIS — I6523 Occlusion and stenosis of bilateral carotid arteries: Secondary | ICD-10-CM | POA: Diagnosis not present

## 2022-05-31 DIAGNOSIS — M1712 Unilateral primary osteoarthritis, left knee: Secondary | ICD-10-CM | POA: Diagnosis not present

## 2022-05-31 DIAGNOSIS — Z96651 Presence of right artificial knee joint: Secondary | ICD-10-CM | POA: Diagnosis not present

## 2022-05-31 DIAGNOSIS — I8393 Asymptomatic varicose veins of bilateral lower extremities: Secondary | ICD-10-CM | POA: Diagnosis not present

## 2022-05-31 DIAGNOSIS — Z9181 History of falling: Secondary | ICD-10-CM | POA: Diagnosis not present

## 2022-05-31 DIAGNOSIS — I491 Atrial premature depolarization: Secondary | ICD-10-CM | POA: Diagnosis not present

## 2022-05-31 DIAGNOSIS — E039 Hypothyroidism, unspecified: Secondary | ICD-10-CM | POA: Diagnosis not present

## 2022-05-31 DIAGNOSIS — Z7901 Long term (current) use of anticoagulants: Secondary | ICD-10-CM | POA: Diagnosis not present

## 2022-05-31 DIAGNOSIS — Z471 Aftercare following joint replacement surgery: Secondary | ICD-10-CM | POA: Diagnosis not present

## 2022-05-31 DIAGNOSIS — M353 Polymyalgia rheumatica: Secondary | ICD-10-CM | POA: Diagnosis not present

## 2022-05-31 DIAGNOSIS — I4819 Other persistent atrial fibrillation: Secondary | ICD-10-CM | POA: Diagnosis not present

## 2022-05-31 DIAGNOSIS — I2510 Atherosclerotic heart disease of native coronary artery without angina pectoris: Secondary | ICD-10-CM | POA: Diagnosis not present

## 2022-05-31 DIAGNOSIS — I119 Hypertensive heart disease without heart failure: Secondary | ICD-10-CM | POA: Diagnosis not present

## 2022-05-31 DIAGNOSIS — I081 Rheumatic disorders of both mitral and tricuspid valves: Secondary | ICD-10-CM | POA: Diagnosis not present

## 2022-06-02 DIAGNOSIS — E039 Hypothyroidism, unspecified: Secondary | ICD-10-CM | POA: Diagnosis not present

## 2022-06-02 DIAGNOSIS — I119 Hypertensive heart disease without heart failure: Secondary | ICD-10-CM | POA: Diagnosis not present

## 2022-06-02 DIAGNOSIS — M316 Other giant cell arteritis: Secondary | ICD-10-CM | POA: Diagnosis not present

## 2022-06-02 DIAGNOSIS — Z96651 Presence of right artificial knee joint: Secondary | ICD-10-CM | POA: Diagnosis not present

## 2022-06-02 DIAGNOSIS — I491 Atrial premature depolarization: Secondary | ICD-10-CM | POA: Diagnosis not present

## 2022-06-02 DIAGNOSIS — Z471 Aftercare following joint replacement surgery: Secondary | ICD-10-CM | POA: Diagnosis not present

## 2022-06-02 DIAGNOSIS — I4819 Other persistent atrial fibrillation: Secondary | ICD-10-CM | POA: Diagnosis not present

## 2022-06-02 DIAGNOSIS — M1712 Unilateral primary osteoarthritis, left knee: Secondary | ICD-10-CM | POA: Diagnosis not present

## 2022-06-02 DIAGNOSIS — I6523 Occlusion and stenosis of bilateral carotid arteries: Secondary | ICD-10-CM | POA: Diagnosis not present

## 2022-06-02 DIAGNOSIS — M353 Polymyalgia rheumatica: Secondary | ICD-10-CM | POA: Diagnosis not present

## 2022-06-02 DIAGNOSIS — I2510 Atherosclerotic heart disease of native coronary artery without angina pectoris: Secondary | ICD-10-CM | POA: Diagnosis not present

## 2022-06-02 DIAGNOSIS — I081 Rheumatic disorders of both mitral and tricuspid valves: Secondary | ICD-10-CM | POA: Diagnosis not present

## 2022-06-02 DIAGNOSIS — I8393 Asymptomatic varicose veins of bilateral lower extremities: Secondary | ICD-10-CM | POA: Diagnosis not present

## 2022-06-02 DIAGNOSIS — Z7901 Long term (current) use of anticoagulants: Secondary | ICD-10-CM | POA: Diagnosis not present

## 2022-06-02 DIAGNOSIS — Z9181 History of falling: Secondary | ICD-10-CM | POA: Diagnosis not present

## 2022-06-03 DIAGNOSIS — M6281 Muscle weakness (generalized): Secondary | ICD-10-CM | POA: Diagnosis not present

## 2022-06-03 DIAGNOSIS — M25661 Stiffness of right knee, not elsewhere classified: Secondary | ICD-10-CM | POA: Diagnosis not present

## 2022-06-03 DIAGNOSIS — Z96651 Presence of right artificial knee joint: Secondary | ICD-10-CM | POA: Diagnosis not present

## 2022-06-05 DIAGNOSIS — M25661 Stiffness of right knee, not elsewhere classified: Secondary | ICD-10-CM | POA: Diagnosis not present

## 2022-06-05 DIAGNOSIS — M6281 Muscle weakness (generalized): Secondary | ICD-10-CM | POA: Diagnosis not present

## 2022-06-05 DIAGNOSIS — Z96651 Presence of right artificial knee joint: Secondary | ICD-10-CM | POA: Diagnosis not present

## 2022-06-09 DIAGNOSIS — I480 Paroxysmal atrial fibrillation: Secondary | ICD-10-CM | POA: Diagnosis not present

## 2022-06-09 DIAGNOSIS — M25661 Stiffness of right knee, not elsewhere classified: Secondary | ICD-10-CM | POA: Diagnosis not present

## 2022-06-09 DIAGNOSIS — N183 Chronic kidney disease, stage 3 unspecified: Secondary | ICD-10-CM | POA: Diagnosis not present

## 2022-06-09 DIAGNOSIS — E039 Hypothyroidism, unspecified: Secondary | ICD-10-CM | POA: Diagnosis not present

## 2022-06-09 DIAGNOSIS — Z7901 Long term (current) use of anticoagulants: Secondary | ICD-10-CM | POA: Diagnosis not present

## 2022-06-09 DIAGNOSIS — N952 Postmenopausal atrophic vaginitis: Secondary | ICD-10-CM | POA: Diagnosis not present

## 2022-06-09 DIAGNOSIS — Z681 Body mass index (BMI) 19 or less, adult: Secondary | ICD-10-CM | POA: Diagnosis not present

## 2022-06-09 DIAGNOSIS — Z96651 Presence of right artificial knee joint: Secondary | ICD-10-CM | POA: Diagnosis not present

## 2022-06-09 DIAGNOSIS — M6281 Muscle weakness (generalized): Secondary | ICD-10-CM | POA: Diagnosis not present

## 2022-06-09 DIAGNOSIS — N939 Abnormal uterine and vaginal bleeding, unspecified: Secondary | ICD-10-CM | POA: Diagnosis not present

## 2022-06-09 DIAGNOSIS — N390 Urinary tract infection, site not specified: Secondary | ICD-10-CM | POA: Diagnosis not present

## 2022-06-10 DIAGNOSIS — N390 Urinary tract infection, site not specified: Secondary | ICD-10-CM | POA: Diagnosis not present

## 2022-06-13 DIAGNOSIS — R319 Hematuria, unspecified: Secondary | ICD-10-CM | POA: Diagnosis not present

## 2022-06-13 DIAGNOSIS — M25661 Stiffness of right knee, not elsewhere classified: Secondary | ICD-10-CM | POA: Diagnosis not present

## 2022-06-13 DIAGNOSIS — M6281 Muscle weakness (generalized): Secondary | ICD-10-CM | POA: Diagnosis not present

## 2022-06-13 DIAGNOSIS — N939 Abnormal uterine and vaginal bleeding, unspecified: Secondary | ICD-10-CM | POA: Diagnosis not present

## 2022-06-13 DIAGNOSIS — Z96651 Presence of right artificial knee joint: Secondary | ICD-10-CM | POA: Diagnosis not present

## 2022-06-16 DIAGNOSIS — Z96651 Presence of right artificial knee joint: Secondary | ICD-10-CM | POA: Diagnosis not present

## 2022-06-16 DIAGNOSIS — M6281 Muscle weakness (generalized): Secondary | ICD-10-CM | POA: Diagnosis not present

## 2022-06-16 DIAGNOSIS — M25661 Stiffness of right knee, not elsewhere classified: Secondary | ICD-10-CM | POA: Diagnosis not present

## 2022-06-18 DIAGNOSIS — Z96651 Presence of right artificial knee joint: Secondary | ICD-10-CM | POA: Diagnosis not present

## 2022-06-18 DIAGNOSIS — M6281 Muscle weakness (generalized): Secondary | ICD-10-CM | POA: Diagnosis not present

## 2022-06-18 DIAGNOSIS — M25661 Stiffness of right knee, not elsewhere classified: Secondary | ICD-10-CM | POA: Diagnosis not present

## 2022-06-20 DIAGNOSIS — M6281 Muscle weakness (generalized): Secondary | ICD-10-CM | POA: Diagnosis not present

## 2022-06-20 DIAGNOSIS — Z96651 Presence of right artificial knee joint: Secondary | ICD-10-CM | POA: Diagnosis not present

## 2022-06-20 DIAGNOSIS — M25661 Stiffness of right knee, not elsewhere classified: Secondary | ICD-10-CM | POA: Diagnosis not present

## 2022-06-23 DIAGNOSIS — M1711 Unilateral primary osteoarthritis, right knee: Secondary | ICD-10-CM | POA: Diagnosis not present

## 2022-06-23 DIAGNOSIS — Z96651 Presence of right artificial knee joint: Secondary | ICD-10-CM | POA: Diagnosis not present

## 2022-06-23 DIAGNOSIS — M25661 Stiffness of right knee, not elsewhere classified: Secondary | ICD-10-CM | POA: Diagnosis not present

## 2022-06-23 DIAGNOSIS — M6281 Muscle weakness (generalized): Secondary | ICD-10-CM | POA: Diagnosis not present

## 2022-07-02 DIAGNOSIS — M6281 Muscle weakness (generalized): Secondary | ICD-10-CM | POA: Diagnosis not present

## 2022-07-02 DIAGNOSIS — M25661 Stiffness of right knee, not elsewhere classified: Secondary | ICD-10-CM | POA: Diagnosis not present

## 2022-07-02 DIAGNOSIS — Z96651 Presence of right artificial knee joint: Secondary | ICD-10-CM | POA: Diagnosis not present

## 2022-07-10 DIAGNOSIS — M25661 Stiffness of right knee, not elsewhere classified: Secondary | ICD-10-CM | POA: Diagnosis not present

## 2022-07-10 DIAGNOSIS — M6281 Muscle weakness (generalized): Secondary | ICD-10-CM | POA: Diagnosis not present

## 2022-07-10 DIAGNOSIS — Z96651 Presence of right artificial knee joint: Secondary | ICD-10-CM | POA: Diagnosis not present

## 2022-07-14 DIAGNOSIS — M25661 Stiffness of right knee, not elsewhere classified: Secondary | ICD-10-CM | POA: Diagnosis not present

## 2022-07-14 DIAGNOSIS — M6281 Muscle weakness (generalized): Secondary | ICD-10-CM | POA: Diagnosis not present

## 2022-07-14 DIAGNOSIS — Z96651 Presence of right artificial knee joint: Secondary | ICD-10-CM | POA: Diagnosis not present

## 2022-07-16 DIAGNOSIS — Z96651 Presence of right artificial knee joint: Secondary | ICD-10-CM | POA: Diagnosis not present

## 2022-07-16 DIAGNOSIS — M25661 Stiffness of right knee, not elsewhere classified: Secondary | ICD-10-CM | POA: Diagnosis not present

## 2022-07-16 DIAGNOSIS — M6281 Muscle weakness (generalized): Secondary | ICD-10-CM | POA: Diagnosis not present

## 2022-07-21 DIAGNOSIS — M1711 Unilateral primary osteoarthritis, right knee: Secondary | ICD-10-CM | POA: Diagnosis not present

## 2022-07-22 DIAGNOSIS — M25661 Stiffness of right knee, not elsewhere classified: Secondary | ICD-10-CM | POA: Diagnosis not present

## 2022-07-22 DIAGNOSIS — M6281 Muscle weakness (generalized): Secondary | ICD-10-CM | POA: Diagnosis not present

## 2022-07-22 DIAGNOSIS — Z96651 Presence of right artificial knee joint: Secondary | ICD-10-CM | POA: Diagnosis not present

## 2022-07-24 DIAGNOSIS — M25661 Stiffness of right knee, not elsewhere classified: Secondary | ICD-10-CM | POA: Diagnosis not present

## 2022-07-24 DIAGNOSIS — M6281 Muscle weakness (generalized): Secondary | ICD-10-CM | POA: Diagnosis not present

## 2022-07-24 DIAGNOSIS — Z96651 Presence of right artificial knee joint: Secondary | ICD-10-CM | POA: Diagnosis not present

## 2022-07-29 DIAGNOSIS — Z96651 Presence of right artificial knee joint: Secondary | ICD-10-CM | POA: Diagnosis not present

## 2022-07-29 DIAGNOSIS — M25661 Stiffness of right knee, not elsewhere classified: Secondary | ICD-10-CM | POA: Diagnosis not present

## 2022-07-29 DIAGNOSIS — M6281 Muscle weakness (generalized): Secondary | ICD-10-CM | POA: Diagnosis not present

## 2022-08-01 DIAGNOSIS — Z96651 Presence of right artificial knee joint: Secondary | ICD-10-CM | POA: Diagnosis not present

## 2022-08-01 DIAGNOSIS — M6281 Muscle weakness (generalized): Secondary | ICD-10-CM | POA: Diagnosis not present

## 2022-08-01 DIAGNOSIS — M25661 Stiffness of right knee, not elsewhere classified: Secondary | ICD-10-CM | POA: Diagnosis not present

## 2022-08-12 ENCOUNTER — Telehealth: Payer: Self-pay | Admitting: Cardiology

## 2022-08-12 DIAGNOSIS — I480 Paroxysmal atrial fibrillation: Secondary | ICD-10-CM

## 2022-08-12 MED ORDER — APIXABAN 2.5 MG PO TABS: 2.5000 mg | ORAL_TABLET | Freq: Two times a day (BID) | ORAL | 1 refills | Status: DC

## 2022-08-12 NOTE — Telephone Encounter (Signed)
Pt last saw Bernadene Person, NP 03/27/22, last labs 05/23/22 Creat 1.09, age 88, weight 54.9kg, based on specified criteria pt is on appropriate dosage of Eliquis 2.5mg  BID for afib.  Will refill rx.

## 2022-08-12 NOTE — Telephone Encounter (Signed)
 *  STAT* If patient is at the pharmacy, call can be transferred to refill team.   1. Which medications need to be refilled? (please list name of each medication and dose if known)           apixaban (ELIQUIS) 2.5 MG TABS tablet     2. Which pharmacy/location (including street and city if local pharmacy) is medication to be sent to? [facility] - [place], [place] - [address] [facility] AT Portal to Registered [facility] Sites   3. Do they need a 30 day or 90 day supply? 90 days

## 2022-08-14 ENCOUNTER — Ambulatory Visit: Payer: Medicare HMO | Admitting: Cardiology

## 2022-09-04 DIAGNOSIS — M5136 Other intervertebral disc degeneration, lumbar region: Secondary | ICD-10-CM | POA: Diagnosis not present

## 2022-09-04 DIAGNOSIS — M961 Postlaminectomy syndrome, not elsewhere classified: Secondary | ICD-10-CM | POA: Diagnosis not present

## 2022-09-04 DIAGNOSIS — Z79899 Other long term (current) drug therapy: Secondary | ICD-10-CM | POA: Diagnosis not present

## 2022-09-04 DIAGNOSIS — M5030 Other cervical disc degeneration, unspecified cervical region: Secondary | ICD-10-CM | POA: Diagnosis not present

## 2022-09-04 DIAGNOSIS — Z5181 Encounter for therapeutic drug level monitoring: Secondary | ICD-10-CM | POA: Diagnosis not present

## 2022-09-08 DIAGNOSIS — H40013 Open angle with borderline findings, low risk, bilateral: Secondary | ICD-10-CM | POA: Diagnosis not present

## 2022-09-08 DIAGNOSIS — I10 Essential (primary) hypertension: Secondary | ICD-10-CM | POA: Diagnosis not present

## 2022-09-09 DIAGNOSIS — Z681 Body mass index (BMI) 19 or less, adult: Secondary | ICD-10-CM | POA: Diagnosis not present

## 2022-09-09 DIAGNOSIS — R636 Underweight: Secondary | ICD-10-CM | POA: Diagnosis not present

## 2022-09-09 DIAGNOSIS — Z0000 Encounter for general adult medical examination without abnormal findings: Secondary | ICD-10-CM | POA: Diagnosis not present

## 2022-09-22 DIAGNOSIS — L821 Other seborrheic keratosis: Secondary | ICD-10-CM | POA: Diagnosis not present

## 2022-09-22 DIAGNOSIS — B351 Tinea unguium: Secondary | ICD-10-CM | POA: Diagnosis not present

## 2022-09-22 DIAGNOSIS — L820 Inflamed seborrheic keratosis: Secondary | ICD-10-CM | POA: Diagnosis not present

## 2022-10-13 ENCOUNTER — Ambulatory Visit: Payer: Medicare HMO | Attending: Cardiology | Admitting: Cardiology

## 2022-10-13 ENCOUNTER — Ambulatory Visit: Payer: Medicare HMO | Attending: Cardiology

## 2022-10-13 ENCOUNTER — Encounter: Payer: Self-pay | Admitting: Cardiology

## 2022-10-13 VITALS — BP 130/60 | HR 59 | Ht 66.0 in | Wt 121.6 lb

## 2022-10-13 DIAGNOSIS — I10 Essential (primary) hypertension: Secondary | ICD-10-CM

## 2022-10-13 DIAGNOSIS — I480 Paroxysmal atrial fibrillation: Secondary | ICD-10-CM

## 2022-10-13 DIAGNOSIS — R600 Localized edema: Secondary | ICD-10-CM

## 2022-10-13 DIAGNOSIS — I340 Nonrheumatic mitral (valve) insufficiency: Secondary | ICD-10-CM

## 2022-10-13 DIAGNOSIS — I351 Nonrheumatic aortic (valve) insufficiency: Secondary | ICD-10-CM | POA: Diagnosis not present

## 2022-10-13 MED ORDER — DILTIAZEM HCL 30 MG PO TABS: 30.0000 mg | ORAL_TABLET | Freq: Every day | ORAL | 1 refills | Status: AC | PRN

## 2022-10-13 NOTE — Addendum Note (Signed)
Addended by: Luellen Pucker on: 10/13/2022 11:52 AM   Modules accepted: Orders

## 2022-10-13 NOTE — Progress Notes (Unsigned)
Enrolled for [employer] to mail a ZIO XT long term holter monitor to the patients address on file.   Dr. Turner to read. 

## 2022-10-13 NOTE — Patient Instructions (Signed)
Medication Instructions:  Your physician recommends that you continue on your current medications as directed. Please refer to the Current Medication list given to you today.  *If you need a refill on your cardiac medications before your next appointment, please call your pharmacy*   Lab Work: None.  If you have labs (blood work) drawn today and your tests are completely normal, you will receive your results only by: MyChart Message (if you have MyChart) OR A paper copy in the mail If you have any lab test that is abnormal or we need to change your treatment, we will call you to review the results.   Testing/Procedures: Your physician has requested that you have an echocardiogram. Echocardiography is a painless test that uses sound waves to create images of your heart. It provides your doctor with information about the size and shape of your heart and how well your heart's chambers and valves are working. This procedure takes approximately one hour. There are no restrictions for this procedure. Please do NOT wear cologne, perfume, aftershave, or lotions (deodorant is allowed). Please arrive 15 minutes prior to your appointment time.  ZIO AT Long term monitor-Live Telemetry  Your physician has requested you wear a ZIO patch monitor for 14 days.  This is a single patch monitor. [employer] supplies one patch monitor per enrollment. Additional  stickers are not available.  Please do not apply patch if you will be having a Nuclear Stress Test, Echocardiogram, Cardiac CT, MRI,  or Chest Xray during the period you would be wearing the monitor. The patch cannot be worn during  these tests. You cannot remove and re-apply the ZIO AT patch monitor.  Your ZIO patch monitor will be mailed 3 day USPS to your address on file. It may take 3-5 days to  receive your monitor after you have been enrolled.  Once you have received your monitor, please review the enclosed instructions. Your monitor has  already  been registered assigning a specific monitor serial # to you.   Billing and Patient Assistance Program information  Meredeth Ide has been supplied with any insurance information on record for billing. [employer] offers a sliding scale Patient Assistance Program for patients without insurance, or whose  insurance does not completely cover the cost of the ZIO patch monitor. You must apply for the  Patient Assistance Program to qualify for the discounted rate. To apply, call [employer] at [phone],  select option 4, select option 2 , ask to apply for the Patient Assistance Program, (you can request an  interpreter if needed). [employer] will ask your household income and how many people are in your  household. [employer] will quote your out-of-pocket cost based on this information. They will also be able  to set up a 12 month interest free payment plan if needed.  Applying the monitor   Shave hair from upper left chest.  Hold the abrader disc by orange tab. Rub the abrader in 40 strokes over left upper chest as indicated in  your monitor instructions.  Clean area with 4 enclosed alcohol pads. Use all pads to ensure the area is cleaned thoroughly. Let  dry.  Apply patch as indicated in monitor instructions. Patch will be placed under collarbone on left side of  chest with arrow pointing upward.  Rub patch adhesive wings for 2 minutes. Remove the white label marked "1". Remove the white label  marked "2". Rub patch adhesive wings for 2 additional minutes.  While looking in a mirror, press and release  button in center of patch. A small green light will flash 3-4  times. This will be your only indicator that the monitor has been turned on.  Do not shower for the first 24 hours. You may shower after the first 24 hours.  Press the button if you feel a symptom. You will hear a small click. Record Date, Time and Symptom in  the Patient Log.   Starting the Gateway  In your kit there is a Audiological scientist box the  size of a cellphone. This is Buyer, retail. It transmits all your  recorded data to [facility]. This box must always stay within 10 feet of you. Open the box and push the *  button. There will be a light that blinks orange and then green a few times. When the light stops  blinking, the Gateway is connected to the ZIO patch. Call [employer] at [phone] to confirm your monitor is transmitting.  Returning your monitor  Remove your patch and place it inside the Gateway. In the lower half of the Gateway there is a white  bag with prepaid postage on it. Place Gateway in bag and seal. Mail package back to [place] as soon as  possible. Your physician should have your final report approximately 7 days after you have mailed back  your monitor. Call [facility] Customer Care at [phone] if you have questions regarding your ZIO AT  patch monitor. Call them immediately if you see an orange light blinking on your monitor.  If your monitor falls off in less than 4 days, contact our Monitor department at [phone]. If your  monitor becomes loose or falls off after 4 days call [employer] at [phone] for suggestions on  securing your monitor    Follow-Up:  We recommend signing up for the patient portal called "MyChart".  Sign up information is provided on this After Visit Summary.  MyChart is used to connect with patients for Virtual Visits (Telemedicine).  Patients are able to view lab/test results, encounter notes, upcoming appointments, etc.  Non-urgent messages can be sent to your provider as well.   To learn more about what you can do with MyChart, go to [email or web address].    Your next appointment:   1 year(s)  Provider:   [name], MD

## 2022-10-13 NOTE — Progress Notes (Signed)
Cardiology Office Note:    Date:  10/13/2022   ID:  Samantha Henderson, DOB 1934/11/30, MRN [id number]  PCP:  Jarrett Soho, PA-C  Cardiologist:  [name], MD    Referring MD: Jarrett Soho, PA-C   Chief Complaint  Patient presents with   Mitral Regurgitation   Aortic Insuffiency   Atrial Fibrillation   Hypertension     History of Present Illness:    Samantha Henderson is a 88 y.o. female with a hx of persistent atrial fibrillation, moderate AR by 2D echocardiogram 06/10/2016, mild MR and HTN. Nuclear stress test done for chest pain 2021 showed no ischemia and 2D echo showed normal LVF with G2DD and mild MR and AR.  Event monitor showed recurrent PAF with RVR and she was referred to afib clinic. Due to bradycardia at baseline her BB could not be increased and since her PAF was not felt to be impacting QOL AADT was not started.    She is here today for followup and is doing well.  She denies any chest pain or pressure, SOB, DOE, PND, orthopnea, dizziness or syncope. She has LE edema if she has been standing too long. She has had more issues with palpitations recently lasting up to 30 minutes.  Usually she is in bed when she notices them.  She is compliant with her meds and is tolerating meds with no SE.    Past Medical History:  Diagnosis Date   Abdominal bruit    Abdominal bruit in the past no aortic aneurysm by evaluation in the past   Aortic regurgitation    Moderate by echo 05/2016   Arm weakness    Arm weakness with exercise, January, 2012   Arthritis    Benign essential HTN 01/27/2017   Bradycardia    Carotid stenosis    1-39% bilateral by dopplers 09/2019   Constipation    Constipation with diltiazem March, 2012   Diastolic dysfunction    Mild   Dizziness    Dysrhythmia    Ejection fraction 2011   55-60% ejection fraction, echo, January, 2012   HTN (hypertension)    Hypothyroidism    Leg cramps    from lipitor in the past   Mitral regurgitation    echo..04/2007  /   mild..04/2010   Mitral regurgitation 11/15/2018   Mild to moderate by echo 09/2018   PAC (premature atrial contraction)    Palpitation    Pericardial effusion    small...posterior...echo..04/2007   Polymyalgia (HCC)    Pulmonary hypertension (HCC)    Rheumatic fever    Questionable history of rheumatic fever, but no history of valvular abnormalities   Sinus tachycardia    Temporal arteritis (HCC)    Rule out temporal arteritis in the past   Tricuspid regurgitation    -moderate...echo..04/2007...Marland KitchenMarland KitchenEF  60%..echo..04/2007  /   55-60%....echo..05/21/2010   Varicose veins of both lower extremities     Past Surgical History:  Procedure Laterality Date   BACK SURGERY     BUNIONECTOMY     HIP PINNING,CANNULATED Left 04/12/2016   Procedure: CANNULATED HIP PINNING;  Surgeon: Teryl Lucy, MD;  Location: [facility] OR;  Service: Orthopedics;  Laterality: Left;   NECK SURGERY     TONSILLECTOMY     TOTAL ABDOMINAL HYSTERECTOMY     TOTAL KNEE ARTHROPLASTY Right 05/13/2022   Procedure: TOTAL KNEE ARTHROPLASTY;  Surgeon: Teryl Lucy, MD;  Location: [facility];  Service: Orthopedics;  Laterality: Right;    Current Medications: Current Meds  Medication Sig   apixaban (ELIQUIS) 2.5 MG TABS tablet Take 1 tablet (2.5 mg total) by mouth 2 (two) times daily.   atorvastatin (LIPITOR) 10 MG tablet Take by mouth daily.    Biotin 10 MG CAPS Take 10 mg by mouth daily.   calcium carbonate (TUMS EX) 750 MG chewable tablet Chew 1 tablet by mouth daily as needed for heartburn.   Cholecalciferol (VITAMIN D3) 125 MCG (5000 UT) CAPS Take 1 capsule by mouth daily.    diltiazem (CARDIZEM CD) 120 MG 24 hr capsule Take 1 capsule (120 mg total) by mouth daily.   diltiazem (CARDIZEM) 30 MG tablet Take 1 tablet (30 mg total) by mouth daily as needed. For breakthrough atrial fibrillation.   diphenhydrAMINE (BENADRYL) 50 MG tablet Take 50 mg by mouth at bedtime.   hydrochlorothiazide (MICROZIDE) 12.5 MG capsule Take 1 capsule (12.5  mg total) by mouth daily.   HYDROcodone-acetaminophen (NORCO) 10-325 MG tablet Take 1 tablet by mouth every 4 (four) hours as needed.   levothyroxine (SYNTHROID) 100 MCG tablet Take 1 tablet (100 mcg total) by mouth daily.   Magnesium 250 MG TABS Take 250 mg by mouth daily.   meclizine (ANTIVERT) 12.5 MG tablet Take 12.5 mg by mouth 3 (three) times daily as needed.   Melatonin 10 MG TABS Take 10 mg by mouth at bedtime.   nitrofurantoin, macrocrystal-monohydrate, (MACROBID) 100 MG capsule Take 1 capsule (100 mg total) by mouth 2 (two) times daily.   Omega-3 Fatty Acids (FISH OIL) 1000 MG CAPS Take 1,000 mg by mouth daily.   polyethylene glycol (MIRALAX / GLYCOLAX) packet Take 17 g by mouth daily.   traZODone (DESYREL) 50 MG tablet Take 12.5 mg by mouth at bedtime as needed for sleep.   TURMERIC PO Take 1,000 mg by mouth daily.     Allergies:   Niacin and related, Amlodipine, Cefdinir, Celecoxib, Codeine, Nebivolol hcl, Pregabalin, Ciprofloxacin, Lisinopril, Plaquenil [hydroxychloroquine], Sulfamethoxazole-trimethoprim, Augmentin [amoxicillin-pot clavulanate], Cephalexin, and Sulfonamide derivatives   Social History   Socioeconomic History   Marital status: Widowed    Spouse name: Not on file   Number of children: Not on file   Years of education: Not on file   Highest education level: Not on file  Occupational History   Not on file  Tobacco Use   Smoking status: Never   Smokeless tobacco: Never  Vaping Use   Vaping Use: Never used  Substance and Sexual Activity   Alcohol use: No   Drug use: No   Sexual activity: Not on file  Other Topics Concern   Not on file  Social History Narrative   Not on file   Social Determinants of Health   Financial Resource Strain: Not on file  Food Insecurity: No Food Insecurity (05/13/2022)   Hunger Vital Sign    Worried About Running Out of Food in the Last Year: Never true    Ran Out of Food in the Last Year: Never true  Transportation Needs:  No Transportation Needs (05/13/2022)   PRAPARE - Administrator, Civil Service (Medical): No    Lack of Transportation (Non-Medical): No  Physical Activity: Not on file  Stress: Not on file  Social Connections: Not on file     Family History: The patient's family history includes Heart disease in her mother; Stroke in her mother.  ROS:   Please see the history of present illness.    ROS  All other systems reviewed and negative.   EKGs/Labs/Other Studies  Reviewed:    The following studies were reviewed today:  2D echo 10/03/2019 IMPRESSIONS    1. Left ventricular ejection fraction, by estimation, is 60 to 65%. The  left ventricle has normal function. The left ventricle has no regional  wall motion abnormalities. There is mild concentric left ventricular  hypertrophy. Left ventricular diastolic  parameters are consistent with Grade II diastolic dysfunction  (pseudonormalization).   2. Right ventricular systolic function is normal. The right ventricular  size is normal. There is normal pulmonary artery systolic pressure. The  estimated right ventricular systolic pressure is 30.2 mmHg.   3. Left atrial size was mildly dilated.   4. Right atrial size was moderately dilated.   5. The mitral valve is normal in structure. Mild mitral valve  regurgitation. No evidence of mitral stenosis.   6. The aortic valve is tricuspid. Aortic valve regurgitation is mild.  Mild aortic valve sclerosis is present, with no evidence of aortic valve  stenosis.   7. The inferior vena cava is normal in size with greater than 50%  respiratory variability, suggesting right atrial pressure of 3 mmHg.   8. Cystic structure in the liver measuring 5.8 x 5.4cm. Consider  dedicated liver US.    Recent Labs: 05/01/2022: TSH 3.00 05/23/2022: ALT 38; BUN 24; Creatinine, Ser 1.09; Hemoglobin 13.2; Platelets 263; Potassium 3.1; Sodium 134   Recent Lipid Panel No results found for: "CHOL", "TRIG",  "HDL", "CHOLHDL", "VLDL", "LDLCALC", "LDLDIRECT"  Physical Exam:    VS:  BP 130/60   Pulse (!) 59   Ht 5\' 6"  (1.676 m)   Wt 121 lb 9.6 oz (55.2 kg)   SpO2 96%   BMI 19.63 kg/m     Wt Readings from Last 3 Encounters:  10/13/22 121 lb 9.6 oz (55.2 kg)  05/23/22 121 lb (54.9 kg)  05/13/22 121 lb (54.9 kg)    GEN: Well nourished, well developed in no acute distress HEENT: Normal NECK: No JVD; No carotid bruits LYMPHATICS: No lymphadenopathy CARDIAC:RRR, no murmurs, rubs, gallops RESPIRATORY:  Clear to auscultation without rales, wheezing or rhonchi  ABDOMEN: Soft, non-tender, non-distended MUSCULOSKELETAL:  No edema; No deformity  SKIN: Warm and dry NEUROLOGIC:  Alert and oriented x 3 PSYCHIATRIC:  Normal affect  ASSESSMENT:    1. Paroxysmal A-fib (HCC)   2. Essential hypertension   3. Nonrheumatic aortic valve insufficiency   4. Nonrheumatic mitral valve regurgitation   5. Bilateral leg edema    PLAN:    In order of problems listed above:  1.  PAF -She continues to maintain normal sinus rhythm.   -she has recently noticed an increase in the frequency and duration of palpitations up to 30 min at a time and usually occurs around bedtime.  -She denies any bleeding problems on DOAC -Continue prescription drug management Cardizem CD 120 mg daily and apixaban 2.5 mg twice daily (dosed for age greater than 80 and weight less than 60 kg) with as needed refills.   -I have refilled her short acting Cardizem to take when she gets the palpitations -I have personally reviewed and interpreted outside labs performed by patient's PCP which showed serum creatinine 1.08, hemoglobin 12.3 and potassium 3.8 on 06/09/2022 -I will get a 2 week Ziopatch to assess frequency of afib and adequacy of rate control when in afib  2.  HTN -BP is at controlled on exam today -Continue prescription manage with Cardizem CD 120 mg daily and HCTZ 12.5 mg daily with as needed refills  3.  Aortic  insufficiency -mild by echo 09/2019 -Repeat 2D echo  4.  Mitral regurgitation -mild by echo 09/2019 -Repeat 2D echo  5.  LE edema -chronic in nature but stable on diuretics -continue diuretics and low Na diet  Followup with me in 1 year  Medication Adjustments/Labs and Tests Ordered: Current medicines are reviewed at length with the patient today.  Concerns regarding medicines are outlined above.  No orders of the defined types were placed in this encounter.   No orders of the defined types were placed in this encounter.    Signed, [name], MD  10/13/2022 11:42 AM    [facility]

## 2022-10-15 DIAGNOSIS — I480 Paroxysmal atrial fibrillation: Secondary | ICD-10-CM | POA: Diagnosis not present

## 2022-11-03 DIAGNOSIS — I480 Paroxysmal atrial fibrillation: Secondary | ICD-10-CM | POA: Diagnosis not present

## 2022-11-05 DIAGNOSIS — Z1231 Encounter for screening mammogram for malignant neoplasm of breast: Secondary | ICD-10-CM | POA: Diagnosis not present

## 2022-11-05 LAB — HM MAMMOGRAPHY

## 2022-11-07 ENCOUNTER — Telehealth: Payer: Self-pay

## 2022-11-07 NOTE — Telephone Encounter (Signed)
Called to discuss heart monitor results with patient, no answer. LMTCB per DPR.

## 2022-11-07 NOTE — Telephone Encounter (Signed)
-----   Message from [name] sent at 11/06/2022 10:41 AM EDT ----- Heart monitor showed mainly sinus rhythm with 1 episode of nonsustained ventricular tachycardia for 6 beats.  There were very few PVCs.  She also had multiple episodes of a fast heartbeat from the top of the heart called nonsustained atrial tachycardia with the longest episode lasting 16 beats.  Cannot increase Cardizem further because of her resting heart rate of 60 bpm.  I would recommend that she try to avoid caffeine and alcohol.  Since her episodes are more common at night would recommend she take her Cardizem at nighttime instead of in the morning and see if that improves things.  Please get her in with an extender in about 4 weeks to see how she is doing.  Also have her come in for a bmet, magnesium and TSH

## 2022-11-11 ENCOUNTER — Ambulatory Visit (HOSPITAL_COMMUNITY): Payer: Medicare HMO | Attending: Cardiology

## 2022-11-11 DIAGNOSIS — I3139 Other pericardial effusion (noninflammatory): Secondary | ICD-10-CM

## 2022-11-11 DIAGNOSIS — I083 Combined rheumatic disorders of mitral, aortic and tricuspid valves: Secondary | ICD-10-CM | POA: Diagnosis not present

## 2022-11-11 DIAGNOSIS — I340 Nonrheumatic mitral (valve) insufficiency: Secondary | ICD-10-CM | POA: Insufficient documentation

## 2022-11-11 LAB — ECHOCARDIOGRAM COMPLETE
Area-P 1/2: 4.06 cm2
MV M vel: 5.02 m/s
MV Peak grad: 100.8 mmHg
S' Lateral: 3 cm

## 2022-11-12 ENCOUNTER — Encounter: Payer: Self-pay | Admitting: Cardiology

## 2022-11-14 ENCOUNTER — Telehealth: Payer: Self-pay

## 2022-11-14 NOTE — Telephone Encounter (Signed)
Called to discuss echo results, no answer. Left message asking patient to call our office.

## 2022-11-14 NOTE — Telephone Encounter (Signed)
-----   Message from [name] sent at 11/12/2022  4:12 PM EDT ----- Heart function normal with enlarged left atrium and right atrium, mildly leaky MV, mildly calcified aortic valve and small pericardial effusion - repeat echo in 4 weeks limited for pericardial effusion

## 2022-11-28 ENCOUNTER — Telehealth: Payer: Self-pay

## 2022-11-28 DIAGNOSIS — I3139 Other pericardial effusion (noninflammatory): Secondary | ICD-10-CM

## 2022-11-28 DIAGNOSIS — I4819 Other persistent atrial fibrillation: Secondary | ICD-10-CM

## 2022-11-28 DIAGNOSIS — Z79899 Other long term (current) drug therapy: Secondary | ICD-10-CM

## 2022-11-28 NOTE — Telephone Encounter (Signed)
-----   Message from [name] sent at 11/12/2022  4:15 PM EDT ----- She has had an intermittent pericardial effusion since 2009 on CT.  Please get an ESR,CRP, ANA, RF.  Please find out when her last mammogram was

## 2022-11-28 NOTE — Telephone Encounter (Signed)
Called patient to review several test results.   Discussed heart monitor showed mainly sinus rhythm with 1 episode of nonsustained ventricular tachycardia for 6 beats.  There were very few PVCs.  She also had multiple episodes of a fast heartbeat from the top of the heart called nonsustained atrial tachycardia with the longest episode lasting 16 beats.  Explained that we cannot increase Cardizem further because of her resting heart rate of 60 bpm.  Dr. Mayford Knife recommends that she try to avoid caffeine and alcohol and since her episodes are more common at night would recommend she take her Cardizem at nighttime instead of in the morning and see if that improves things.  Patient verbalizes understanding. Also scheduled patient w/ T. Conte for 12/23/22  to see how she is doing.  Scheduled patient for  bmet, magnesium and TSH on 12/01/22.  Also reviewed Echo results. Explained to patient that she has had an intermittent pericardial effusion since 2009 on CT. Added ESR,CRP, ANA, RF to labs for 12/01/22. Patient's last mammogram was 11/04/22 at [facility]. Patient states she may have a copy of the report and will drop off when she comes to do her labs 12/01/22.  Echo also showing heart function normal with enlarged left atrium and right atrium, mildly leaky MV, mildly calcified aortic valve and small pericardial effusion. Per Dr. Mayford Knife order for repeat echo in 4 weeks placed for limited for pericardial effusion. Echo scheduled for 12/17/22.

## 2022-12-01 ENCOUNTER — Ambulatory Visit: Payer: Medicare HMO | Attending: Cardiology

## 2022-12-01 DIAGNOSIS — Z79899 Other long term (current) drug therapy: Secondary | ICD-10-CM | POA: Diagnosis not present

## 2022-12-01 DIAGNOSIS — I3139 Other pericardial effusion (noninflammatory): Secondary | ICD-10-CM | POA: Diagnosis not present

## 2022-12-01 LAB — SEDIMENTATION RATE

## 2022-12-01 LAB — BASIC METABOLIC PANEL
BUN/Creatinine Ratio: 13 (ref 12–28)
BUN: 16 mg/dL (ref 8–27)
CO2: 27 mmol/L (ref 20–29)
Calcium: 10 mg/dL (ref 8.7–10.3)
Chloride: 98 mmol/L (ref 96–106)
Creatinine, Ser: 1.27 mg/dL — ABNORMAL HIGH (ref 0.57–1.00)
Glucose: 102 mg/dL — ABNORMAL HIGH (ref 70–99)
Potassium: 4.1 mmol/L (ref 3.5–5.2)
Sodium: 137 mmol/L (ref 134–144)
eGFR: 41 mL/min/{1.73_m2} — ABNORMAL LOW (ref >59)

## 2022-12-01 LAB — C-REACTIVE PROTEIN

## 2022-12-01 LAB — ANA

## 2022-12-01 LAB — RHEUMATOID FACTOR

## 2022-12-01 LAB — TSH: TSH: 1.33 u[IU]/mL (ref 0.450–4.500)

## 2022-12-05 ENCOUNTER — Telehealth: Payer: Self-pay

## 2022-12-05 DIAGNOSIS — Z79899 Other long term (current) drug therapy: Secondary | ICD-10-CM

## 2022-12-05 NOTE — Telephone Encounter (Signed)
Reviewed with patient that BMET, sed rate, CRP, NA, RF a nd TSH are stable. Patient verbalizes understanding. Noted on heart monitor results that Mag was requested by Dr. Mayford Knife on 11/16/22 and not run, lab ordered and scheduled.

## 2022-12-05 NOTE — Telephone Encounter (Signed)
Call to [facility] to request mammogram from this year, spoke to rep who states she will fax mammography from 2024 and 2023 to [phone].

## 2022-12-05 NOTE — Telephone Encounter (Signed)
-----   Message from [name] sent at 12/02/2022 12:59 PM EDT ----- Sed rate, CRP, NA and RF are normal

## 2022-12-09 ENCOUNTER — Ambulatory Visit: Payer: Medicare HMO | Attending: Cardiology

## 2022-12-09 DIAGNOSIS — Z79899 Other long term (current) drug therapy: Secondary | ICD-10-CM | POA: Diagnosis not present

## 2022-12-10 LAB — MAGNESIUM: Magnesium: 2.2 mg/dL (ref 1.6–2.3)

## 2022-12-11 ENCOUNTER — Telehealth: Payer: Self-pay

## 2022-12-11 NOTE — Telephone Encounter (Signed)
-----   Message from [name] sent at 12/10/2022  8:29 AM EDT ----- Please let patient know that labs were normal.  Continue current medical therapy.

## 2022-12-11 NOTE — Telephone Encounter (Signed)
Call to patient let advise that magnesium level was normal and to continue current meds. No answer, left detailed message per DPR advising of results and instructing patient to call our office if any questions.

## 2022-12-17 ENCOUNTER — Ambulatory Visit (HOSPITAL_COMMUNITY): Payer: Medicare HMO | Attending: Internal Medicine

## 2022-12-17 DIAGNOSIS — I3139 Other pericardial effusion (noninflammatory): Secondary | ICD-10-CM | POA: Diagnosis not present

## 2022-12-17 LAB — ECHOCARDIOGRAM LIMITED: Area-P 1/2: 3.91 cm2

## 2022-12-18 ENCOUNTER — Encounter: Payer: Self-pay | Admitting: Cardiology

## 2022-12-23 ENCOUNTER — Encounter: Payer: Self-pay | Admitting: Physician Assistant

## 2022-12-23 ENCOUNTER — Ambulatory Visit: Payer: Medicare HMO | Attending: Physician Assistant | Admitting: Physician Assistant

## 2022-12-23 VITALS — BP 102/54 | HR 51 | Ht 66.0 in | Wt 124.8 lb

## 2022-12-23 DIAGNOSIS — Z79899 Other long term (current) drug therapy: Secondary | ICD-10-CM | POA: Diagnosis not present

## 2022-12-23 DIAGNOSIS — I10 Essential (primary) hypertension: Secondary | ICD-10-CM

## 2022-12-23 DIAGNOSIS — I340 Nonrheumatic mitral (valve) insufficiency: Secondary | ICD-10-CM

## 2022-12-23 DIAGNOSIS — I351 Nonrheumatic aortic (valve) insufficiency: Secondary | ICD-10-CM

## 2022-12-23 DIAGNOSIS — I480 Paroxysmal atrial fibrillation: Secondary | ICD-10-CM

## 2022-12-23 DIAGNOSIS — R600 Localized edema: Secondary | ICD-10-CM

## 2022-12-23 NOTE — Patient Instructions (Signed)
Medication Instructions:   Your physician recommends that you continue on your current medications as directed. Please refer to the Current Medication list given to you today.    *If you need a refill on your cardiac medications before your next appointment, please call your pharmacy*   Lab Work: NONE ORDERED  TODAY   If you have labs (blood work) drawn today and your tests are completely normal, you will receive your results only by: MyChart Message (if you have MyChart) OR A paper copy in the mail If you have any lab test that is abnormal or we need to change your treatment, we will call you to review the results.   Testing/Procedures:  NONE ORDERED  TODAY     Follow-Up: At [facility], you and your health needs are our priority.  As part of our continuing mission to provide you with exceptional heart care, we have created designated Provider Care Teams.  These Care Teams include your primary Cardiologist (physician) and Advanced Practice Providers (APPs -  Physician Assistants and Nurse Practitioners) who all work together to provide you with the care you need, when you need it.  We recommend signing up for the patient portal called "MyChart".  Sign up information is provided on this After Visit Summary.  MyChart is used to connect with patients for Virtual Visits (Telemedicine).  Patients are able to view lab/test results, encounter notes, upcoming appointments, etc.  Non-urgent messages can be sent to your provider as well.   To learn more about what you can do with MyChart, go to [email or web address].    Your next appointment:   4 month(s)  Provider:   [name], MD     Other Instructions  Low-Sodium Eating Plan Salt (sodium) helps you keep a healthy balance of fluids in your body. Too much sodium can raise your blood pressure. It can also cause fluid and waste to be held in your body. Your health care provider or dietitian may recommend a low-sodium  eating plan if you have high blood pressure (hypertension), kidney disease, liver disease, or heart failure. Eating less sodium can help lower your blood pressure and reduce swelling. It can also protect your heart, liver, and kidneys. What are tips for following this plan? Reading food labels  Check food labels for the amount of sodium per serving. If you eat more than one serving, you must multiply the listed amount by the number of servings. Choose foods with less than 140 milligrams (mg) of sodium per serving. Avoid foods with 300 mg of sodium or more per serving. Always check how much sodium is in a product, even if the label says "unsalted" or "no salt added." Shopping  Buy products labeled as "low-sodium" or "no salt added." Buy fresh foods. Avoid canned foods and pre-made or frozen meals. Avoid canned, cured, or processed meats. Buy breads that have less than 80 mg of sodium per slice. Cooking  Eat more home-cooked food. Try to eat less restaurant, buffet, and fast food. Try not to add salt when you cook. Use salt-free seasonings or herbs instead of table salt or sea salt. Check with your provider or pharmacist before using salt substitutes. Cook with plant-based oils, such as canola, sunflower, or olive oil. Meal planning When eating at a restaurant, ask if your food can be made with less salt or no salt. Avoid dishes labeled as brined, pickled, cured, or smoked. Avoid dishes made with soy sauce, miso, or teriyaki sauce. Avoid foods that have  monosodium glutamate (MSG) in them. MSG may be added to some restaurant food, sauces, soups, bouillon, and canned foods. Make meals that can be grilled, baked, poached, roasted, or steamed. These are often made with less sodium. General information Try to limit your sodium intake to 1,500-2,300 mg each day, or the amount told by your provider. What foods should I eat? Fruits Fresh, frozen, or canned fruit. Fruit juice. Vegetables Fresh or  frozen vegetables. "No salt added" canned vegetables. "No salt added" tomato sauce and paste. Low-sodium or reduced-sodium tomato and vegetable juice. Grains Low-sodium cereals, such as oats, puffed wheat and rice, and shredded wheat. Low-sodium crackers. Unsalted rice. Unsalted pasta. Low-sodium bread. Whole grain breads and whole grain pasta. Meats and other proteins Fresh or frozen meat, poultry, seafood, and fish. These should have no added salt. Low-sodium canned tuna and salmon. Unsalted nuts. Dried peas, beans, and lentils without added salt. Unsalted canned beans. Eggs. Unsalted nut butters. Dairy Milk. Soy milk. Cheese that is naturally low in sodium, such as ricotta cheese, fresh mozzarella, or Swiss cheese. Low-sodium or reduced-sodium cheese. Cream cheese. Yogurt. Seasonings and condiments Fresh and dried herbs and spices. Salt-free seasonings. Low-sodium mustard and ketchup. Sodium-free salad dressing. Sodium-free light mayonnaise. Fresh or refrigerated horseradish. Lemon juice. Vinegar. Other foods Homemade, reduced-sodium, or low-sodium soups. Unsalted popcorn and pretzels. Low-salt or salt-free chips. The items listed above may not be all the foods and drinks you can have. Talk to a dietitian to learn more. What foods should I avoid? Vegetables Sauerkraut, pickled vegetables, and relishes. Olives. [place] fries. Onion rings. Regular canned vegetables, except low-sodium or reduced-sodium items. Regular canned tomato sauce and paste. Regular tomato and vegetable juice. Frozen vegetables in sauces. Grains Instant hot cereals. Bread stuffing, pancake, and biscuit mixes. Croutons. Seasoned rice or pasta mixes. Noodle soup cups. Boxed or frozen macaroni and cheese. Regular salted crackers. Self-rising flour. Meats and other proteins Meat or fish that is salted, canned, smoked, spiced, or pickled. Precooked or cured meat, such as sausages or meat loaves. Tomasa Blase. Ham. Pepperoni. Hot dogs.  Corned beef. Chipped beef. Salt pork. Jerky. Pickled herring, anchovies, and sardines. Regular canned tuna. Salted nuts. Dairy Processed cheese and cheese spreads. Hard cheeses. Cheese curds. Blue cheese. Feta cheese. String cheese. Regular cottage cheese. Buttermilk. Canned milk. Fats and oils Salted butter. Regular margarine. Ghee. Bacon fat. Seasonings and condiments Onion salt, garlic salt, seasoned salt, table salt, and sea salt. Canned and packaged gravies. Worcestershire sauce. Tartar sauce. Barbecue sauce. Teriyaki sauce. Soy sauce, including reduced-sodium soy sauce. Steak sauce. Fish sauce. Oyster sauce. Cocktail sauce. Horseradish that you find on the shelf. Regular ketchup and mustard. Meat flavorings and tenderizers. Bouillon cubes. Hot sauce. Pre-made or packaged marinades. Pre-made or packaged taco seasonings. Relishes. Regular salad dressings. Salsa. Other foods Salted popcorn and pretzels. Corn chips and puffs. Potato and tortilla chips. Canned or dried soups. Pizza. Frozen entrees and pot pies. The items listed above may not be all the foods and drinks you should avoid. Talk to a dietitian to learn more. This information is not intended to replace advice given to you by your health care provider. Make sure you discuss any questions you have with your health care provider. Document Revised: 04/24/2022 Document Reviewed: 04/24/2022 Elsevier Patient Education  2024 [employer]   Heart-Healthy Eating Plan Eating a healthy diet is important for the health of your heart. A heart-healthy eating plan includes: Eating less unhealthy fats. Eating more healthy fats. Eating less salt in your  food. Salt is also called sodium. Making other changes in your diet. Talk with your doctor or a diet specialist (dietitian) to create an eating plan that is right for you. What is my plan? Your doctor may recommend an eating plan that includes: Total fat: ______% or less of total calories a  day. Saturated fat: ______% or less of total calories a day. Cholesterol: less than _________mg a day. Sodium: less than _________mg a day. What are tips for following this plan? Cooking Avoid frying your food. Try to bake, boil, grill, or broil it instead. You can also reduce fat by: Removing the skin from poultry. Removing all visible fats from meats. Steaming vegetables in water or broth. Meal planning  At meals, divide your plate into four equal parts: Fill one-half of your plate with vegetables and green salads. Fill one-fourth of your plate with whole grains. Fill one-fourth of your plate with lean protein foods. Eat 2-4 cups of vegetables per day. One cup of vegetables is: 1 cup (91 g) broccoli or cauliflower florets. 2 medium carrots. 1 large bell pepper. 1 large sweet potato. 1 large tomato. 1 medium white potato. 2 cups (150 g) raw leafy greens. Eat 1-2 cups of fruit per day. One cup of fruit is: 1 small apple 1 large banana 1 cup (237 g) mixed fruit, 1 large orange,  cup (82 g) dried fruit, 1 cup (240 mL) 100% fruit juice. Eat more foods that have soluble fiber. These are apples, broccoli, carrots, beans, peas, and barley. Try to get 20-30 g of fiber per day. Eat 4-5 servings of nuts, legumes, and seeds per week: 1 serving of dried beans or legumes equals  cup (90 g) cooked. 1 serving of nuts is  oz (12 almonds, 24 pistachios, or 7 walnut halves). 1 serving of seeds equals  oz (8 g). General information Eat more home-cooked food. Eat less restaurant, buffet, and fast food. Limit or avoid alcohol. Limit foods that are high in starch and sugar. Avoid fried foods. Lose weight if you are overweight. Keep track of how much salt (sodium) you eat. This is important if you have high blood pressure. Ask your doctor to tell you more about this. Try to add vegetarian meals each week. Fats Choose healthy fats. These include olive oil and canola oil, flaxseeds,  walnuts, almonds, and seeds. Eat more omega-3 fats. These include salmon, mackerel, sardines, tuna, flaxseed oil, and ground flaxseeds. Try to eat fish at least 2 times each week. Check food labels. Avoid foods with trans fats or high amounts of saturated fat. Limit saturated fats. These are often found in animal products, such as meats, butter, and cream. These are also found in plant foods, such as palm oil, palm kernel oil, and coconut oil. Avoid foods with partially hydrogenated oils in them. These have trans fats. Examples are stick margarine, some tub margarines, cookies, crackers, and other baked goods. What foods should I eat? Fruits All fresh, canned (in natural juice), or frozen fruits. Vegetables Fresh or frozen vegetables (raw, steamed, roasted, or grilled). Green salads. Grains Most grains. Choose whole wheat and whole grains most of the time. Rice and pasta, including brown rice and pastas made with whole wheat. Meats and other proteins Lean, well-trimmed beef, veal, pork, and lamb. Chicken and [place] without skin. All fish and shellfish. Wild duck, rabbit, pheasant, and venison. Egg whites or low-cholesterol egg substitutes. Dried beans, peas, lentils, and tofu. Seeds and most nuts. Dairy Low-fat or nonfat cheeses, including  ricotta and mozzarella. Skim or 1% milk that is liquid, powdered, or evaporated. Buttermilk that is made with low-fat milk. Nonfat or low-fat yogurt. Fats and oils Non-hydrogenated (trans-free) margarines. Vegetable oils, including soybean, sesame, sunflower, olive, peanut, safflower, corn, canola, and cottonseed. Salad dressings or mayonnaise made with a vegetable oil. Beverages Mineral water. Coffee and tea. Diet carbonated beverages. Sweets and desserts Sherbet, gelatin, and fruit ice. Small amounts of dark chocolate. Limit all sweets and desserts. Seasonings and condiments All seasonings and condiments. The items listed above may not be a complete  list of foods and drinks you can eat. Contact a dietitian for more options. What foods should I avoid? Fruits Canned fruit in heavy syrup. Fruit in cream or butter sauce. Fried fruit. Limit coconut. Vegetables Vegetables cooked in cheese, cream, or butter sauce. Fried vegetables. Grains Breads that are made with saturated or trans fats, oils, or whole milk. Croissants. Sweet rolls. Donuts. High-fat crackers, such as cheese crackers. Meats and other proteins Fatty meats, such as hot dogs, ribs, sausage, bacon, rib-eye roast or steak. High-fat deli meats, such as salami and bologna. Caviar. Domestic duck and goose. Organ meats, such as liver. Dairy Cream, sour cream, cream cheese, and creamed cottage cheese. Whole-milk cheeses. Whole or 2% milk that is liquid, evaporated, or condensed. Whole buttermilk. Cream sauce or high-fat cheese sauce. Yogurt that is made from whole milk. Fats and oils Meat fat, or shortening. Cocoa butter, hydrogenated oils, palm oil, coconut oil, palm kernel oil. Solid fats and shortenings, including bacon fat, salt pork, lard, and butter. Nondairy cream substitutes. Salad dressings with cheese or sour cream. Beverages Regular sodas and juice drinks with added sugar. Sweets and desserts Frosting. Pudding. Cookies. Cakes. Pies. Milk chocolate or white chocolate. Buttered syrups. Full-fat ice cream or ice cream drinks. The items listed above may not be a complete list of foods and drinks to avoid. Contact a dietitian for more information. Summary Heart-healthy meal planning includes eating less unhealthy fats, eating more healthy fats, and making other changes in your diet. Eat a balanced diet. This includes fruits and vegetables, low-fat or nonfat dairy, lean protein, nuts and legumes, whole grains, and heart-healthy oils and fats. This information is not intended to replace advice given to you by your health care provider. Make sure you discuss any questions you have with  your health care provider. Document Revised: 05/13/2021 Document Reviewed: 05/13/2021 Elsevier Patient Education  2024 [employer].

## 2022-12-23 NOTE — Progress Notes (Signed)
Cardiology Office Note:  .   Date:  12/23/2022  ID:  Yehuda Savannah, DOB 02-Jun-1934, MRN [id number] PCP: Jarrett Soho, PA-C  [facility] Providers Cardiologist:  [name], MD {  History of Present Illness: .   Samantha Henderson is a 88 y.o. female/ HTN.  Nuclear stress test done for chest pain 2021 showed no ischemia and 2D echo gram showed normal LVEF with G2 DD and mild MR and AR.  Event monitor showed tachycardia PAF She was referred to atrial fibrillation clinic.  Due to bradycardia at baseline her beta-blocker could not be increased and since her PAF was not felt to be impacting her life a ADT was not started.  She was followed up with by Dr. Mayford Knife in June 2024 and she is doing well at the time.  She denies chest pain/pressure, SOB, DOE, PND, orthopnea, dizziness or syncope.  Has lower extremity edema if she has been standing too long.  She has had more issues with palpitations recently lasting up to 30 minutes.  Usually she has not been much notices them.  She is compliant with medications and tolerating without side effects.  Today, she tells me that about 2 weeks ago she had a tingling sensation from her shoulders to her feet.  Last a few seconds.  No other symptoms.  No shortness of breath or chest pain.  We reviewed her echocardiogram as well as her monitor and discussed results.  She has been taking her Cardizem at night but feels about the same as far as the palpitations go.  Unfortunately cannot increase Cardizem due to her blood pressure today which is 102/54.  No lightheadedness or dizziness.  Otherwise, doing well from a cardiovascular standpoint.  Reports no shortness of breath nor dyspnea on exertion. Reports no chest pain, pressure, or tightness. No edema, orthopnea, PND. Reports no palpitations.   ROS: Pertinent ROS in HPI  Studies Reviewed: .        2D echo 10/03/2019 IMPRESSIONS    1. Left ventricular ejection fraction, by estimation, is 60 to 65%. The  left  ventricle has normal function. The left ventricle has no regional  wall motion abnormalities. There is mild concentric left ventricular  hypertrophy. Left ventricular diastolic  parameters are consistent with Grade II diastolic dysfunction  (pseudonormalization).   2. Right ventricular systolic function is normal. The right ventricular  size is normal. There is normal pulmonary artery systolic pressure. The  estimated right ventricular systolic pressure is 30.2 mmHg.   3. Left atrial size was mildly dilated.   4. Right atrial size was moderately dilated.   5. The mitral valve is normal in structure. Mild mitral valve  regurgitation. No evidence of mitral stenosis.   6. The aortic valve is tricuspid. Aortic valve regurgitation is mild.  Mild aortic valve sclerosis is present, with no evidence of aortic valve  stenosis.   7. The inferior vena cava is normal in size with greater than 50%  respiratory variability, suggesting right atrial pressure of 3 mmHg.   8. Cystic structure in the liver measuring 5.8 x 5.4cm. Consider  dedicated liver US.        Physical Exam:   VS:  BP (!) 102/54   Pulse (!) 51   Ht 5\' 6"  (1.676 m)   Wt 124 lb 12.8 oz (56.6 kg)   SpO2 96%   BMI 20.14 kg/m    Wt Readings from Last 3 Encounters:  12/23/22 124 lb 12.8 oz (56.6  kg)  10/13/22 121 lb 9.6 oz (55.2 kg)  05/23/22 121 lb (54.9 kg)    GEN: Well nourished, well developed in no acute distress NECK: No JVD; No carotid bruits CARDIAC: Irregularly irregular, no murmurs, rubs, gallops RESPIRATORY:  Clear to auscultation without rales, wheezing or rhonchi  ABDOMEN: Soft, non-tender, non-distended EXTREMITIES:  No edema; No deformity   ASSESSMENT AND PLAN: .   1.  Paroxysmal atrial fibrillation -Patient is currently taking Eliquis 5 mg twice a day, Lipitor 10 mg daily, Cardizem 120 mg daily with an extra 30 mg as needed, magnesium 250 mg daily, fish oil 1000 mg daily -Monitor reviewed with the patient  today and all questions answered -No medication changes at this time  2.  HTN -Blood pressure low normal at 102/54 -Would continue current medication regimen, unfortunately cannot increase Cardizem due to low BP today -Encouraged hydration, patient had not drinking much today and that might be contributing to her low blood pressure  3.  Nonrheumatic aortic valve insufficiency -Reviewed echocardiogram with the patient, no further medication changes at this time  4.  Nonrheumatic mitral valve regurgitation -Reviewed echocardiogram with patient, no further medication changes at this time  5.  Bilateral leg edema -euvolemic on exam today      Dispo: She can come back again in 4 to 6 months to see Dr. Mayford Knife  Signed, Sharlene Dory, PA-C

## 2022-12-26 ENCOUNTER — Telehealth: Payer: Self-pay

## 2022-12-26 NOTE — Telephone Encounter (Signed)
Called to discuss Echo results, no answer. Left detailed message per DPR that Echo showed normal heart function EF 60 to 65%. Moderately enlarged left atrium present likely related to hypertension, mildly leaky mitral valve, moderately leaky tricuspid valve mildly calcified aortic valve and small pericardial effusion that is very small.  ESR and CRP was normal so Dr. Mayford Knife does not think this is related to rheumatologic disorder.

## 2022-12-26 NOTE — Telephone Encounter (Signed)
-----   Message from [name] sent at 12/18/2022 10:21 AM EDT ----- Echo showed normal heart function EF 60 to 65% with moderately enlarged left atrium likely related to hypertension, mildly leaky mitral valve, moderately leaky tricuspid valve mildly calcified aortic valve and small pericardial effusion that is very small.  ESR and CRP was normal so do not think this is related to rheumatologic disorder.  Please make sure she is up-to-date on her mammograms

## 2023-01-05 DIAGNOSIS — M5030 Other cervical disc degeneration, unspecified cervical region: Secondary | ICD-10-CM | POA: Diagnosis not present

## 2023-01-05 DIAGNOSIS — M542 Cervicalgia: Secondary | ICD-10-CM | POA: Diagnosis not present

## 2023-01-05 DIAGNOSIS — M5451 Vertebrogenic low back pain: Secondary | ICD-10-CM | POA: Diagnosis not present

## 2023-01-05 DIAGNOSIS — Z79891 Long term (current) use of opiate analgesic: Secondary | ICD-10-CM | POA: Diagnosis not present

## 2023-01-05 DIAGNOSIS — M961 Postlaminectomy syndrome, not elsewhere classified: Secondary | ICD-10-CM | POA: Diagnosis not present

## 2023-01-05 DIAGNOSIS — M5136 Other intervertebral disc degeneration, lumbar region: Secondary | ICD-10-CM | POA: Diagnosis not present

## 2023-01-13 ENCOUNTER — Other Ambulatory Visit: Payer: Self-pay | Admitting: Physician Assistant

## 2023-01-13 ENCOUNTER — Other Ambulatory Visit: Payer: Self-pay | Admitting: Internal Medicine

## 2023-01-19 DIAGNOSIS — Z6820 Body mass index (BMI) 20.0-20.9, adult: Secondary | ICD-10-CM | POA: Diagnosis not present

## 2023-01-19 DIAGNOSIS — D696 Thrombocytopenia, unspecified: Secondary | ICD-10-CM | POA: Diagnosis not present

## 2023-01-19 DIAGNOSIS — R350 Frequency of micturition: Secondary | ICD-10-CM | POA: Diagnosis not present

## 2023-01-20 ENCOUNTER — Encounter: Payer: Self-pay | Admitting: Internal Medicine

## 2023-01-20 ENCOUNTER — Ambulatory Visit: Payer: Medicare HMO | Admitting: Internal Medicine

## 2023-01-20 VITALS — BP 110/70 | HR 72 | Ht 66.0 in | Wt 126.4 lb

## 2023-01-20 DIAGNOSIS — E890 Postprocedural hypothyroidism: Secondary | ICD-10-CM

## 2023-01-20 MED ORDER — LEVOTHYROXINE SODIUM 100 MCG PO TABS: 100.0000 ug | ORAL_TABLET | Freq: Every day | ORAL | 3 refills | Status: DC

## 2023-01-20 NOTE — Patient Instructions (Signed)

## 2023-01-20 NOTE — Progress Notes (Signed)
Name: Samantha Henderson  MRN/ DOB: [id number], 1934-07-26    Age/ Sex: 88 y.o., female     PCP: Jarrett Soho, PA-C   Reason for Endocrinology Evaluation: Postoperative Hypothyroidism      Initial Endocrinology Clinic Visit: 02/22/2018    PATIENT IDENTIFIER: Samantha Henderson is a 88 y.o., female with a past medical history of HTN, temporal arteritis, osteoporosis and A.Fib. She has followed with [facility] since 02/22/2018 for consultative assistance with management of her Hypothyroidism .   HISTORICAL SUMMARY: . 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 She was always on Levothyroxine 112 mcg up until 2019, when her doses have been gradually reduced due to low TS. On her initial visit to our office she was on Levothyroxine 50 mcg daily with symptomatic hypothyroidism. She was on Biotin at the time. We have asked her to hold it for a few days with repeat TFT's consistent with hypothyroidism TSH 15.61 uIU/mL . We started her on Levothyroxine 100 mcg daily and she was asked to hold Biotin for at least 3 days prior to her lab work in the future.    SUBJECTIVE:    Today (01/20/2023):  Samantha Henderson is here for her a follow up on hypothyroidism.   She continues to follow-up with cardiology for paroxysmal A-fib, nonrheumatic aortic valve insufficiency and HTN She continues to follow-up with rheumatology for temporal arteritis  She is s/p right total knee replacement 04/2022  Weight continues to fluctuate  Denies local neck swelling  She has chronic constipation , takes miralax regularly.  Denies palpitations  Denies tremors   She did not hold Biotin this time    Levothyroxine 100 mcg daily  HISTORY:  Past Medical History:  Past Medical History:  Diagnosis Date   Abdominal bruit    Abdominal bruit in the past no aortic aneurysm by evaluation in the past   Aortic regurgitation    Moderate by echo 05/2016   Arm weakness    Arm weakness with exercise, January,  2012   Arthritis    Benign essential HTN 01/27/2017   Bradycardia    Carotid stenosis    1-39% bilateral by dopplers 09/2019   Constipation    Constipation with diltiazem March, 2012   Diastolic dysfunction    Mild   Dizziness    Dysrhythmia    Ejection fraction 2011   55-60% ejection fraction, echo, January, 2012   HTN (hypertension)    Hypothyroidism    Leg cramps    from lipitor in the past   Mitral regurgitation    echo..04/2007  /  mild..04/2010   Mitral regurgitation 11/15/2018   Mild to moderate by echo 09/2018   PAC (premature atrial contraction)    Palpitation    Pericardial effusion    mild on echo 10/2022   Polymyalgia (HCC)    Pulmonary hypertension (HCC)    Rheumatic fever    Questionable history of rheumatic fever, but no history of valvular abnormalities   Sinus tachycardia    Temporal arteritis (HCC)    Rule out temporal arteritis in the past   Tricuspid regurgitation    Moderate by echo 11/2022   Varicose veins of both lower extremities    Past Surgical History:  Past Surgical History:  Procedure Laterality Date   BACK SURGERY     BUNIONECTOMY     HIP PINNING,CANNULATED Left 04/12/2016   Procedure: CANNULATED HIP PINNING;  Surgeon: Teryl Lucy, MD;  Location: [facility];  Service: Orthopedics;  Laterality: Left;   NECK SURGERY     TONSILLECTOMY     TOTAL ABDOMINAL HYSTERECTOMY     TOTAL KNEE ARTHROPLASTY Right 05/13/2022   Procedure: TOTAL KNEE ARTHROPLASTY;  Surgeon: Teryl Lucy, MD;  Location: [facility];  Service: Orthopedics;  Laterality: Right;   Social History:  reports that she has never smoked. She has never used smokeless tobacco. She reports that she does not drink alcohol and does not use drugs. Family History: family history includes Heart disease in her mother; Stroke in her mother.   HOME MEDICATIONS: Levothyroxine 100 mcg daily     OBJECTIVE:   PHYSICAL EXAM: VS: BP 110/70 (BP Location: Left Arm, Patient Position: Sitting, Cuff Size:  Small)   Pulse 72   Ht 5\' 6"  (1.676 m)   Wt 126 lb 6.4 oz (57.3 kg)   SpO2 98%   BMI 20.40 kg/m    EXAM: General: Pt appears well and is in NAD  Neck: General: Supple without adenopathy. Thyroid: No goiter or nodules appreciated.  Lungs: Clear with good BS bilat  Heart: Auscultation: RRR.  Extremities:  BL LE: No pretibial edema  Mental Status: Judgment, insight: Intact Orientation: Oriented to time, place, and person Mood and affect: No depression, anxiety, or agitation     DATA REVIEWED:  Latest Reference Range & Units 12/01/22 08:06  TSH 0.450 - 4.500 uIU/mL 1.330    ASSESSMENT / PLAN / RECOMMENDATIONS:   Post-Operative Hypothyroidism :  - She is clinically  euthyroid  -  Pt educated extensively on the correct way to take levothyroxine (first thing in the morning with water, 30 minutes before eating or taking other medications). - Pt encouraged to double dose the following day if she were to miss a dose given long half-life of levothyroxine. -Most recent TSH is within normal range, no change    Medications  Continue  Levothyroxine 100 mcg daily     F/U in 1 yr   Signed electronically by: Lyndle Herrlich, MD  [facility]  [facility] [address] [place], [place] [address] Phone: [phone] FAX: [phone]      CC: Jarrett Soho, PA-C [address] [place] [address] Phone: [phone]  Fax: [phone]   Return to Endocrinology clinic as below: Future Appointments  Date Time Provider Department Center  01/20/2023  9:30 AM Mellody Masri, Konrad Dolores, MD [facility] None  04/23/2023  9:15 AM Sherrie George, MD [facility] None

## 2023-01-21 DIAGNOSIS — M1711 Unilateral primary osteoarthritis, right knee: Secondary | ICD-10-CM | POA: Diagnosis not present

## 2023-01-21 DIAGNOSIS — M1712 Unilateral primary osteoarthritis, left knee: Secondary | ICD-10-CM | POA: Diagnosis not present

## 2023-02-08 DIAGNOSIS — J029 Acute pharyngitis, unspecified: Secondary | ICD-10-CM | POA: Diagnosis not present

## 2023-02-09 ENCOUNTER — Other Ambulatory Visit: Payer: Self-pay | Admitting: Cardiology

## 2023-02-09 DIAGNOSIS — I480 Paroxysmal atrial fibrillation: Secondary | ICD-10-CM

## 2023-02-09 NOTE — Telephone Encounter (Signed)
Pt last saw Jari Favre, [place] on 12/23/22, last labs 12/01/22 Creat 1.27, age 88, weight 57.3kg, based on specified criteria pt is on appropriate dosage of Eliquis 2.5mg  BID for afib.  Will refill rx.

## 2023-03-03 DIAGNOSIS — M329 Systemic lupus erythematosus, unspecified: Secondary | ICD-10-CM | POA: Diagnosis not present

## 2023-03-03 DIAGNOSIS — I10 Essential (primary) hypertension: Secondary | ICD-10-CM | POA: Diagnosis not present

## 2023-03-03 DIAGNOSIS — N183 Chronic kidney disease, stage 3 unspecified: Secondary | ICD-10-CM | POA: Diagnosis not present

## 2023-03-03 DIAGNOSIS — Z23 Encounter for immunization: Secondary | ICD-10-CM | POA: Diagnosis not present

## 2023-03-03 DIAGNOSIS — E559 Vitamin D deficiency, unspecified: Secondary | ICD-10-CM | POA: Diagnosis not present

## 2023-03-03 DIAGNOSIS — G479 Sleep disorder, unspecified: Secondary | ICD-10-CM | POA: Diagnosis not present

## 2023-03-03 DIAGNOSIS — E039 Hypothyroidism, unspecified: Secondary | ICD-10-CM | POA: Diagnosis not present

## 2023-03-03 DIAGNOSIS — Z7901 Long term (current) use of anticoagulants: Secondary | ICD-10-CM | POA: Diagnosis not present

## 2023-03-03 DIAGNOSIS — M069 Rheumatoid arthritis, unspecified: Secondary | ICD-10-CM | POA: Diagnosis not present

## 2023-03-03 DIAGNOSIS — K219 Gastro-esophageal reflux disease without esophagitis: Secondary | ICD-10-CM | POA: Diagnosis not present

## 2023-03-03 DIAGNOSIS — I4819 Other persistent atrial fibrillation: Secondary | ICD-10-CM | POA: Diagnosis not present

## 2023-03-03 DIAGNOSIS — E7800 Pure hypercholesterolemia, unspecified: Secondary | ICD-10-CM | POA: Diagnosis not present

## 2023-03-22 DIAGNOSIS — H578A2 Foreign body sensation, left eye: Secondary | ICD-10-CM | POA: Diagnosis not present

## 2023-03-22 DIAGNOSIS — H5789 Other specified disorders of eye and adnexa: Secondary | ICD-10-CM | POA: Diagnosis not present

## 2023-04-21 ENCOUNTER — Other Ambulatory Visit (HOSPITAL_COMMUNITY): Payer: Self-pay

## 2023-04-21 ENCOUNTER — Other Ambulatory Visit (HOSPITAL_BASED_OUTPATIENT_CLINIC_OR_DEPARTMENT_OTHER): Payer: Self-pay

## 2023-04-21 MED FILL — Apixaban Tab 2.5 MG: ORAL | 90 days supply | Qty: 180 | Fill #0 | Status: CN

## 2023-04-23 ENCOUNTER — Encounter (INDEPENDENT_AMBULATORY_CARE_PROVIDER_SITE_OTHER): Payer: HMO | Admitting: Ophthalmology

## 2023-04-23 DIAGNOSIS — H35033 Hypertensive retinopathy, bilateral: Secondary | ICD-10-CM | POA: Diagnosis not present

## 2023-04-23 DIAGNOSIS — H43813 Vitreous degeneration, bilateral: Secondary | ICD-10-CM | POA: Diagnosis not present

## 2023-04-23 DIAGNOSIS — H33303 Unspecified retinal break, bilateral: Secondary | ICD-10-CM | POA: Diagnosis not present

## 2023-04-23 DIAGNOSIS — I10 Essential (primary) hypertension: Secondary | ICD-10-CM

## 2023-05-05 ENCOUNTER — Other Ambulatory Visit: Payer: Self-pay

## 2023-05-05 ENCOUNTER — Other Ambulatory Visit (HOSPITAL_COMMUNITY): Payer: Self-pay

## 2023-05-05 MED FILL — Apixaban Tab 2.5 MG: ORAL | 90 days supply | Qty: 180 | Fill #0 | Status: AC

## 2023-05-18 ENCOUNTER — Other Ambulatory Visit (HOSPITAL_COMMUNITY): Payer: Self-pay

## 2023-05-18 ENCOUNTER — Telehealth: Payer: Self-pay

## 2023-05-18 NOTE — Telephone Encounter (Signed)
Patient advised that I spoke with [facility] pharmacist and he states that they have not received any information about the Levothyroxine being recalled. Patient received a letter from [facility] and she no longer uses the store for medication. Last Levothyroxine was from [facility].

## 2023-05-18 NOTE — Telephone Encounter (Signed)
Patient called and verbalized there is a medication that is recalled and wondering what she should do or if she should continue to take the medication.  The medication is Levothyroxine.  Please f/u with patient

## 2023-07-03 DIAGNOSIS — N289 Disorder of kidney and ureter, unspecified: Secondary | ICD-10-CM | POA: Diagnosis not present

## 2023-07-13 ENCOUNTER — Other Ambulatory Visit (HOSPITAL_COMMUNITY): Payer: Self-pay

## 2023-07-13 ENCOUNTER — Other Ambulatory Visit: Payer: Self-pay

## 2023-07-13 ENCOUNTER — Telehealth: Payer: Self-pay | Admitting: Internal Medicine

## 2023-07-13 MED ORDER — LEVOTHYROXINE SODIUM 100 MCG PO TABS
100.0000 ug | ORAL_TABLET | Freq: Every day | ORAL | 1 refills | Status: DC
  Filled 2023-07-13: qty 90, 90d supply, fill #0
  Filled 2023-10-19: qty 90, 90d supply, fill #1

## 2023-07-13 NOTE — Telephone Encounter (Signed)
 MEDICATION: levothyroxine levothyroxine (SYNTHROID) 100 MCG tablet  PHARMACY:   [facility] (Ph: [phone])   HAS THE PATIENT CONTACTED THEIR PHARMACY?  Yes  IS THIS A 90 DAY SUPPLY : Yes  IS PATIENT OUT OF MEDICATION: No  IF NOT; HOW MUCH IS LEFT: 1 Week  LAST APPOINTMENT DATE: @10 /04/2022  NEXT APPOINTMENT DATE:@10 /1/82025  DO WE HAVE YOUR PERMISSION TO LEAVE A DETAILED MESSAGE?:Yes  OTHER COMMENTS:    **Let patient know to contact pharmacy at the end of the day to make sure medication is ready. **  ** Please notify patient to allow 48-72 hours to process**  **Encourage patient to contact the pharmacy for refills or they can request refills through [facility]**

## 2023-07-13 NOTE — Telephone Encounter (Signed)
 Done

## 2023-07-14 NOTE — Progress Notes (Unsigned)
 Cardiology Office Note:    Date:  07/15/2023   ID:  Samantha Henderson, DOB 1934-08-10, MRN [id number]  PCP:  Sigmund Hazel, MD  Cardiologist:  [name], MD    Referring MD: Jarrett Soho, PA-C   Chief Complaint  Patient presents with   Atrial Fibrillation   Hypertension   Aortic Insuffiency   Mitral Regurgitation     History of Present Illness:    Samantha Henderson is a 89 y.o. female with a hx of persistent atrial fibrillation, moderate AR by 2D echocardiogram 06/10/2016, mild MR and HTN. Nuclear stress test done for chest pain 2021 showed no ischemia and 2D echo showed normal LVF with G2DD and mild MR and AR.  Event monitor showed recurrent PAF with RVR and she was referred to afib clinic. Due to bradycardia at baseline her BB could not be increased and since her PAF was not felt to be impacting QOL AADT was not started.    She is here today for followup and is doing well.  She tells me that she has had some discomfort in her chest that occurs 2 times monthly and is fairly short lived lasting 1-2 minutes.  There are no associated sx of nausea, diaphoresis or SOB. There is no radiatio. She denies any chest pain or pressure, SOB, DOE, PND, orthopnea, LE edema, dizziness, palpitations or syncope. SHe is compliant with her meds and is tolerating meds with no SE.    Past Medical History:  Diagnosis Date   Abdominal bruit    Abdominal bruit in the past no aortic aneurysm by evaluation in the past   Aortic regurgitation    Moderate by echo 05/2016   Arm weakness    Arm weakness with exercise, January, 2012   Arthritis    Benign essential HTN 01/27/2017   Bradycardia    Carotid stenosis    1-39% bilateral by dopplers 09/2019   Constipation    Constipation with diltiazem March, 2012   Diastolic dysfunction    Mild   Dizziness    Dysrhythmia    Ejection fraction 2011   55-60% ejection fraction, echo, January, 2012   HTN (hypertension)    Hypothyroidism    Leg cramps    from lipitor  in the past   Mitral regurgitation    echo..04/2007  /  mild..04/2010   Mitral regurgitation 11/15/2018   Mild to moderate by echo 09/2018   PAC (premature atrial contraction)    Palpitation    Pericardial effusion    mild on echo 10/2022   Polymyalgia (HCC)    Pulmonary hypertension (HCC)    Rheumatic fever    Questionable history of rheumatic fever, but no history of valvular abnormalities   Sinus tachycardia    Temporal arteritis (HCC)    Rule out temporal arteritis in the past   Tricuspid regurgitation    Moderate by echo 11/2022   Varicose veins of both lower extremities     Past Surgical History:  Procedure Laterality Date   BACK SURGERY     BUNIONECTOMY     HIP PINNING,CANNULATED Left 04/12/2016   Procedure: CANNULATED HIP PINNING;  Surgeon: Teryl Lucy, MD;  Location: [facility];  Service: Orthopedics;  Laterality: Left;   NECK SURGERY     TONSILLECTOMY     TOTAL ABDOMINAL HYSTERECTOMY     TOTAL KNEE ARTHROPLASTY Right 05/13/2022   Procedure: TOTAL KNEE ARTHROPLASTY;  Surgeon: Teryl Lucy, MD;  Location: [facility];  Service: Orthopedics;  Laterality: Right;  Current Medications: Current Meds  Medication Sig   acetaminophen (TYLENOL 8 HOUR) 650 MG CR tablet 2 tablets Orally every 8 hrs As needed   apixaban (ELIQUIS) 2.5 MG TABS tablet Take 1 tablet (2.5 mg total) by mouth 2 (two) times daily.   atorvastatin (LIPITOR) 10 MG tablet Take by mouth daily.    Biotin 10 MG CAPS Take 10 mg by mouth daily.   calcium carbonate (TUMS EX) 750 MG chewable tablet Chew 1 tablet by mouth daily as needed for heartburn.   Cholecalciferol (VITAMIN D3) 125 MCG (5000 UT) CAPS Take 1 capsule by mouth daily.    diltiazem (CARDIZEM) 30 MG tablet Take 1 tablet (30 mg total) by mouth daily as needed. For breakthrough atrial fibrillation.   diphenhydrAMINE (BENADRYL) 50 MG tablet Take 50 mg by mouth at bedtime.   HYDROcodone-acetaminophen (NORCO) 10-325 MG tablet Take 1 tablet by mouth every 4  (four) hours as needed.   levothyroxine (SYNTHROID) 100 MCG tablet Take 1 tablet (100 mcg total) by mouth daily.   Magnesium 250 MG TABS Take 250 mg by mouth daily.   meclizine (ANTIVERT) 12.5 MG tablet Take 12.5 mg by mouth 3 (three) times daily as needed.   Melatonin 10 MG TABS Take 10 mg by mouth at bedtime.   Omega-3 Fatty Acids (FISH OIL) 1000 MG CAPS Take 1,000 mg by mouth daily.   ondansetron (ZOFRAN-ODT) 8 MG disintegrating tablet Take 1 tablet (8 mg total) by mouth every 8 (eight) hours as needed for nausea.   polyethylene glycol (MIRALAX / GLYCOLAX) packet Take 17 g by mouth daily.   polyethylene glycol (MIRALAX) 17 g packet 1 packet Orally Once a day As needed   sennosides-docusate sodium (SENOKOT-S) 8.6-50 MG tablet Take 2 tablets by mouth daily.   traMADol (ULTRAM) 50 MG tablet 1 tablet Orally Once a day As needed   TURMERIC PO Take 1,000 mg by mouth daily.   [DISCONTINUED] diltiazem (CARDIZEM CD) 120 MG 24 hr capsule Take 1 capsule (120 mg total) by mouth daily.   [DISCONTINUED] hydrochlorothiazide (MICROZIDE) 12.5 MG capsule Take 1 capsule (12.5 mg total) by mouth daily.     Allergies:   Niacin and related, Amlodipine, Cefdinir, Celecoxib, Codeine, Nebivolol hcl, Pregabalin, Ciprofloxacin, Lisinopril, Plaquenil [hydroxychloroquine], Sulfamethoxazole-trimethoprim, Augmentin [amoxicillin-pot clavulanate], Cephalexin, and Sulfonamide derivatives   Social History   Socioeconomic History   Marital status: Widowed    Spouse name: Not on file   Number of children: Not on file   Years of education: Not on file   Highest education level: Not on file  Occupational History   Not on file  Tobacco Use   Smoking status: Never   Smokeless tobacco: Never  Vaping Use   Vaping status: Never Used  Substance and Sexual Activity   Alcohol use: No   Drug use: No   Sexual activity: Not on file  Other Topics Concern   Not on file  Social History Narrative   Not on file   Social  Drivers of Health   Financial Resource Strain: Not on file  Food Insecurity: No Food Insecurity (05/13/2022)   Hunger Vital Sign    Worried About Running Out of Food in the Last Year: Never true    Ran Out of Food in the Last Year: Never true  Transportation Needs: No Transportation Needs (05/13/2022)   PRAPARE - Administrator, Civil Service (Medical): No    Lack of Transportation (Non-Medical): No  Physical Activity: Not on file  Stress: Not on file  Social Connections: Unknown (09/03/2021)   [facility]   Social Network    Social Network: Not on file     Family History: The patient's family history includes Heart disease in her mother; Stroke in her mother.  ROS:   Please see the history of present illness.    ROS  All other systems reviewed and negative.   EKGs/Labs/Other Studies Reviewed:    The following studies were reviewed today:  EKG Interpretation Date/Time:  Wednesday July 15 2023 10:07:10 EDT Ventricular Rate:  50 PR Interval:  186 QRS Duration:  88 QT Interval:  444 QTC Calculation: 404 R Axis:   -56  Text Interpretation: Sinus bradycardia with Premature atrial complexes Left axis deviation When compared with ECG of 23-May-2022 13:16, PREVIOUS ECG IS PRESENT Confirmed by [name] ([id number]) on 07/15/2023 10:22:12 AM    2D echo 10/03/2019 IMPRESSIONS    1. Left ventricular ejection fraction, by estimation, is 60 to 65%. The  left ventricle has normal function. The left ventricle has no regional  wall motion abnormalities. There is mild concentric left ventricular  hypertrophy. Left ventricular diastolic  parameters are consistent with Grade II diastolic dysfunction  (pseudonormalization).   2. Right ventricular systolic function is normal. The right ventricular  size is normal. There is normal pulmonary artery systolic pressure. The  estimated right ventricular systolic pressure is 30.2 mmHg.   3. Left atrial  size was mildly dilated.   4. Right atrial size was moderately dilated.   5. The mitral valve is normal in structure. Mild mitral valve  regurgitation. No evidence of mitral stenosis.   6. The aortic valve is tricuspid. Aortic valve regurgitation is mild.  Mild aortic valve sclerosis is present, with no evidence of aortic valve  stenosis.   7. The inferior vena cava is normal in size with greater than 50%  respiratory variability, suggesting right atrial pressure of 3 mmHg.   8. Cystic structure in the liver measuring 5.8 x 5.4cm. Consider  dedicated liver US.    Recent Labs: 12/01/2022: BUN 16; Creatinine, Ser 1.27; Potassium 4.1; Sodium 137; TSH 1.330 12/09/2022: Magnesium 2.2   Recent Lipid Panel No results found for: "CHOL", "TRIG", "HDL", "CHOLHDL", "VLDL", "LDLCALC", "LDLDIRECT"  Physical Exam:    VS:  BP 130/64   Pulse (!) 50   Ht 5\' 6"  (1.676 m)   Wt 114 lb 9.6 oz (52 kg)   SpO2 100%   BMI 18.50 kg/m     Wt Readings from Last 3 Encounters:  07/15/23 114 lb 9.6 oz (52 kg)  01/20/23 126 lb 6.4 oz (57.3 kg)  12/23/22 124 lb 12.8 oz (56.6 kg)    GEN: Well nourished, well developed in no acute distress HEENT: Normal NECK: No JVD; No carotid bruits LYMPHATICS: No lymphadenopathy CARDIAC:RRR, no murmurs, rubs, gallops RESPIRATORY:  Clear to auscultation without rales, wheezing or rhonchi  ABDOMEN: Soft, non-tender, non-distended MUSCULOSKELETAL:  No edema; No deformity  SKIN: Warm and dry NEUROLOGIC:  Alert and oriented x 3 PSYCHIATRIC:  Normal affect  ASSESSMENT:    1. Paroxysmal A-fib (HCC)   2. Benign essential HTN   3. Nonrheumatic aortic valve insufficiency   4. Bilateral leg edema   5. Nonrheumatic mitral valve regurgitation     PLAN:    In order of problems listed above:  1.  PAF -she remains in NSR on exam today   -She denies any bleeding problems on DOAC -continue prescription  drug management with Cardizem CD 120mg  daily and apixaban 2.5mg   BID(dosed for age greater than 80 and weight less than 60 kg)   -I have personally reviewed and interpreted outside labs performed by patient's PCP which showed Hbg 14.1 on 06/15/2023  2.  HTN -BP controlled on exam today -continue prescription drug management with Cardizem CD 120mg  daily and hydrochlorothiazide 12.5mg  daily with PRN refills -I have personally reviewed and interpreted outside labs performed by patient's PCP which showed SCr 1.27 on 06/15/2023 and K+ 4 on 06/15/23  3.  Aortic insufficiency -mild by echo 09/2019 -Repeat 2D echo  4.  Mitral regurgitation -mild by echo 09/2019 -Repeat 2D echo 11/2022 showed mild MR  5.  LE edema -chronic in nature but stable on diuretics -continue diuretics and low Na diet  6.  Tricuspid regurgitation -moderate by echo 11/2022 -repeat echo 11/2023  7.  Chest pain -it is somewhat atypical but I am concerned given her advanced age and CRFs. -will get a Lexiscan myoview to rule out ischemia -Informed Consent   Shared Decision Making/Informed Consent The risks [chest pain, shortness of breath, cardiac arrhythmias, dizziness, blood pressure fluctuations, myocardial infarction, stroke/transient ischemic attack, nausea, vomiting, allergic reaction, radiation exposure, metallic taste sensation and life-threatening complications (estimated to be 1 in 10,000)], benefits (risk stratification, diagnosing coronary artery disease, treatment guidance) and alternatives of a nuclear stress test were discussed in detail with Ms. Stolarz and she agrees to proceed.     Followup with me in 1 year  Medication Adjustments/Labs and Tests Ordered: Current medicines are reviewed at length with the patient today.  Concerns regarding medicines are outlined above.  Orders Placed This Encounter  Procedures   EKG 12-Lead    Meds ordered this encounter  Medications   diltiazem (CARDIZEM CD) 120 MG 24 hr capsule    Sig: Take 1 capsule (120 mg total) by mouth daily.     Dispense:  90 capsule    Refill:  3   hydrochlorothiazide (MICROZIDE) 12.5 MG capsule    Sig: Take 1 capsule (12.5 mg total) by mouth daily.    Dispense:  90 capsule    Refill:  3     Signed, [name], MD  07/15/2023 10:20 AM    [facility]

## 2023-07-15 ENCOUNTER — Encounter: Payer: Self-pay | Admitting: Cardiology

## 2023-07-15 ENCOUNTER — Other Ambulatory Visit (HOSPITAL_COMMUNITY): Payer: Self-pay

## 2023-07-15 ENCOUNTER — Ambulatory Visit: Payer: Medicare HMO | Attending: Cardiology | Admitting: Cardiology

## 2023-07-15 VITALS — BP 130/64 | HR 50 | Ht 66.0 in | Wt 114.6 lb

## 2023-07-15 DIAGNOSIS — I351 Nonrheumatic aortic (valve) insufficiency: Secondary | ICD-10-CM | POA: Diagnosis not present

## 2023-07-15 DIAGNOSIS — I10 Essential (primary) hypertension: Secondary | ICD-10-CM

## 2023-07-15 DIAGNOSIS — R600 Localized edema: Secondary | ICD-10-CM | POA: Diagnosis not present

## 2023-07-15 DIAGNOSIS — I480 Paroxysmal atrial fibrillation: Secondary | ICD-10-CM | POA: Diagnosis not present

## 2023-07-15 DIAGNOSIS — I340 Nonrheumatic mitral (valve) insufficiency: Secondary | ICD-10-CM | POA: Diagnosis not present

## 2023-07-15 DIAGNOSIS — R079 Chest pain, unspecified: Secondary | ICD-10-CM | POA: Diagnosis not present

## 2023-07-15 MED ORDER — HYDROCHLOROTHIAZIDE 12.5 MG PO CAPS
12.5000 mg | ORAL_CAPSULE | Freq: Every day | ORAL | 3 refills | Status: DC
  Filled 2023-07-15: qty 90, 90d supply, fill #0
  Filled 2023-10-19: qty 90, 90d supply, fill #1

## 2023-07-15 MED ORDER — DILTIAZEM HCL ER COATED BEADS 120 MG PO CP24
120.0000 mg | ORAL_CAPSULE | Freq: Every day | ORAL | 3 refills | Status: AC
  Filled 2023-07-15: qty 90, 90d supply, fill #0
  Filled 2023-10-19: qty 90, 90d supply, fill #1
  Filled 2024-02-06: qty 90, 90d supply, fill #2
  Filled 2024-05-06: qty 90, 90d supply, fill #3

## 2023-07-15 NOTE — Patient Instructions (Signed)
 Medication Instructions:  Your physician recommends that you continue on your current medications as directed. Please refer to the Current Medication list given to you today.  *If you need a refill on your cardiac medications before your next appointment, please call your pharmacy*   Lab Work: Your physician recommends that you continue on your current medications as directed. Please refer to the Current Medication list given to you today.  If you have labs (blood work) drawn today and your tests are completely normal, you will receive your results only by: MyChart Message (if you have MyChart) OR A paper copy in the mail If you have any lab test that is abnormal or we need to change your treatment, we will call you to review the results.   Testing/Procedures: Your physician has requested that you have a lexiscan myoview. For further information please visit [email or web address] Please follow instruction sheet, as given.   Your physician has requested that you have an echocardiogram. Echocardiography is a painless test that uses sound waves to create images of your heart. It provides your doctor with information about the size and shape of your heart and how well your heart's chambers and valves are working. This procedure takes approximately one hour. There are no restrictions for this procedure. Please do NOT wear cologne, perfume, aftershave, or lotions (deodorant is allowed). Please arrive 15 minutes prior to your appointment time.  Please note: We ask at that you not bring children with you during ultrasound (echo/ vascular) testing. Due to room size and safety concerns, children are not allowed in the ultrasound rooms during exams. Our front office staff cannot provide observation of children in our lobby area while testing is being conducted. An adult accompanying a patient to their appointment will only be allowed in the ultrasound room at the discretion of the ultrasound technician under  special circumstances. We apologize for any inconvenience.    Follow-Up: At [facility], you and your health needs are our priority.  As part of our continuing mission to provide you with exceptional heart care, we have created designated Provider Care Teams.  These Care Teams include your primary Cardiologist (physician) and Advanced Practice Providers (APPs -  Physician Assistants and Nurse Practitioners) who all work together to provide you with the care you need, when you need it.  We recommend signing up for the patient portal called "MyChart".  Sign up information is provided on this After Visit Summary.  MyChart is used to connect with patients for Virtual Visits (Telemedicine).  Patients are able to view lab/test results, encounter notes, upcoming appointments, etc.  Non-urgent messages can be sent to your provider as well.   To learn more about what you can do with MyChart, go to [email or web address].    Your next appointment:   1 year(s)  Provider:   [name], MD

## 2023-07-15 NOTE — Addendum Note (Signed)
 Addended by: Michaelle Copas on: 07/15/2023 10:39 AM   Modules accepted: Orders

## 2023-08-10 ENCOUNTER — Other Ambulatory Visit: Payer: Self-pay | Admitting: Cardiology

## 2023-08-10 ENCOUNTER — Other Ambulatory Visit: Payer: Self-pay

## 2023-08-10 ENCOUNTER — Other Ambulatory Visit (HOSPITAL_COMMUNITY): Payer: Self-pay

## 2023-08-10 DIAGNOSIS — I480 Paroxysmal atrial fibrillation: Secondary | ICD-10-CM

## 2023-08-10 MED ORDER — APIXABAN 2.5 MG PO TABS
2.5000 mg | ORAL_TABLET | Freq: Two times a day (BID) | ORAL | 1 refills | Status: DC
  Filled 2023-08-10: qty 180, 90d supply, fill #0
  Filled 2023-11-06: qty 180, 90d supply, fill #1

## 2023-08-10 NOTE — Telephone Encounter (Signed)
 Prescription refill request for Eliquis  received. Indication: PAF Last office visit: 07/15/23  Golden Late MD Scr: 1.27 on 12/01/22  Epic Age: 89 Weight: 52kg  Based on above findings Eliquis  2.5mg  twice daily is the appropriate dose.  Refill approved.

## 2023-08-11 ENCOUNTER — Other Ambulatory Visit: Payer: Self-pay

## 2023-08-26 ENCOUNTER — Other Ambulatory Visit: Payer: Self-pay | Admitting: Cardiology

## 2023-08-26 DIAGNOSIS — R079 Chest pain, unspecified: Secondary | ICD-10-CM

## 2023-08-27 ENCOUNTER — Ambulatory Visit (HOSPITAL_COMMUNITY)

## 2023-08-27 ENCOUNTER — Encounter: Payer: Self-pay | Admitting: Cardiology

## 2023-08-27 ENCOUNTER — Ambulatory Visit (HOSPITAL_COMMUNITY): Attending: Cardiology

## 2023-08-27 DIAGNOSIS — I340 Nonrheumatic mitral (valve) insufficiency: Secondary | ICD-10-CM | POA: Insufficient documentation

## 2023-08-27 DIAGNOSIS — I480 Paroxysmal atrial fibrillation: Secondary | ICD-10-CM | POA: Insufficient documentation

## 2023-08-27 DIAGNOSIS — I700 Atherosclerosis of aorta: Secondary | ICD-10-CM | POA: Diagnosis not present

## 2023-08-27 DIAGNOSIS — R079 Chest pain, unspecified: Secondary | ICD-10-CM | POA: Diagnosis not present

## 2023-08-27 DIAGNOSIS — I10 Essential (primary) hypertension: Secondary | ICD-10-CM | POA: Insufficient documentation

## 2023-08-27 DIAGNOSIS — I351 Nonrheumatic aortic (valve) insufficiency: Secondary | ICD-10-CM | POA: Insufficient documentation

## 2023-08-27 LAB — MYOCARDIAL PERFUSION IMAGING
LV dias vol: 59 mL (ref 46–106)
LV sys vol: 16 mL
Nuc Stress EF: 73 %
Peak HR: 69 {beats}/min
Rest HR: 48 {beats}/min
Rest Nuclear Isotope Dose: 10.7 mCi
SDS: 0
SRS: 0
SSS: 0
ST Depression (mm): 0 mm
Stress Nuclear Isotope Dose: 29.6 mCi
TID: 1.03

## 2023-08-27 LAB — ECHOCARDIOGRAM COMPLETE
Area-P 1/2: 3.15 cm2
Height: 66 in
P 1/2 time: 734 ms
S' Lateral: 2.4 cm
Weight: 1824 [oz_av]

## 2023-08-27 MED ORDER — REGADENOSON 0.4 MG/5ML IV SOLN
0.4000 mg | Freq: Once | INTRAVENOUS | Status: AC
  Administered 2023-08-27: 0.4 mg via INTRAVENOUS

## 2023-08-27 MED ORDER — REGADENOSON 0.4 MG/5ML IV SOLN
INTRAVENOUS | Status: AC
  Filled 2023-08-27: qty 5

## 2023-08-27 MED ORDER — TECHNETIUM TC 99M TETROFOSMIN IV KIT
29.6000 | PACK | Freq: Once | INTRAVENOUS | Status: AC | PRN
  Administered 2023-08-27: 29.6 via INTRAVENOUS

## 2023-08-27 MED ORDER — TECHNETIUM TC 99M TETROFOSMIN IV KIT
10.7000 | PACK | Freq: Once | INTRAVENOUS | Status: AC | PRN
Start: 2023-08-27 — End: 2023-08-27
  Administered 2023-08-27: 10.7 via INTRAVENOUS

## 2023-08-31 DIAGNOSIS — I10 Essential (primary) hypertension: Secondary | ICD-10-CM | POA: Diagnosis not present

## 2023-08-31 DIAGNOSIS — H40013 Open angle with borderline findings, low risk, bilateral: Secondary | ICD-10-CM | POA: Diagnosis not present

## 2023-09-03 ENCOUNTER — Ambulatory Visit: Payer: Self-pay

## 2023-09-03 DIAGNOSIS — K7689 Other specified diseases of liver: Secondary | ICD-10-CM

## 2023-09-03 DIAGNOSIS — I2510 Atherosclerotic heart disease of native coronary artery without angina pectoris: Secondary | ICD-10-CM

## 2023-09-03 DIAGNOSIS — Q2112 Patent foramen ovale: Secondary | ICD-10-CM

## 2023-09-03 NOTE — Telephone Encounter (Signed)
 Call from patient to discuss results of echo and stress test. Discussed with patient that per Dr. Micael Adas, stress test was fine but Coronary artery calcium  noted on stress test images. Patient agrees to proceed with a coronary ca score CT.   Also explained that on echo, possible cysts were seen on liver and that Dr. Micael Adas would like to order liver ultrasound. Patient states she has a history of liver cysts and her PCP watches these for her. Explained I would order liver ultrasound and forward echo results to PCP.   Lastly, explained echo showed normal pumping function of the heart with mildly thickened heart muscle, mildly leaky mitral valve, moderate leakiness of the tricuspid valve, mild leakiness of the aortic valve. Dr. Micael Adas would like a limited echo with agitated saline contrast to rule out PFO. Patient verbalizes understanding and agrees to plan.

## 2023-09-03 NOTE — Addendum Note (Signed)
 Addended by: Cherylyn Cos on: 09/03/2023 02:16 PM   Modules accepted: Orders

## 2023-09-03 NOTE — Telephone Encounter (Signed)
 Called to discuss stress test and echo results. No answer, no current DPR on file. Left message with no identifiers asking recipient to call [facility] and gave our office #.

## 2023-09-03 NOTE — Telephone Encounter (Signed)
-----   Message from Gaylyn Keas sent at 08/27/2023  7:17 PM EDT ----- Echo showed normal pumping function of the heart with mildly thickened heart muscle, mildly leaky mitral valve, moderate leakiness of the tricuspid valve, mild leakiness of the aortic valve and possible liver cysts.  Please get a dedicated liver ultrasound to assess further.  Please forward results of echo to PCP for review.  Please get a limited echo with agitated saline contrast to rule out PFO

## 2023-09-03 NOTE — Telephone Encounter (Signed)
-----   Message from Gaylyn Keas sent at 08/27/2023  6:26 PM EDT ----- Please let patient know that stress test was fine

## 2023-09-04 ENCOUNTER — Telehealth: Payer: Self-pay | Admitting: Cardiology

## 2023-09-04 NOTE — Telephone Encounter (Signed)
 Left a message to call back.

## 2023-09-04 NOTE — Telephone Encounter (Signed)
 Pt returning nurse call

## 2023-09-04 NOTE — Telephone Encounter (Signed)
 Pt requesting cb to further discuss bubble study na US  of abd. Has additional questions/concerns

## 2023-09-04 NOTE — Telephone Encounter (Signed)
 Spoke with patient and reviewed the reason for the bubble study and reviewed the procedure.  She states understanding and will have the testing as ordered.

## 2023-09-07 ENCOUNTER — Ambulatory Visit: Payer: Self-pay | Admitting: Cardiology

## 2023-09-07 ENCOUNTER — Encounter: Payer: Self-pay | Admitting: Cardiology

## 2023-09-07 DIAGNOSIS — M25562 Pain in left knee: Secondary | ICD-10-CM | POA: Diagnosis not present

## 2023-09-07 DIAGNOSIS — I700 Atherosclerosis of aorta: Secondary | ICD-10-CM

## 2023-09-07 DIAGNOSIS — M5030 Other cervical disc degeneration, unspecified cervical region: Secondary | ICD-10-CM | POA: Diagnosis not present

## 2023-09-07 DIAGNOSIS — M542 Cervicalgia: Secondary | ICD-10-CM | POA: Diagnosis not present

## 2023-09-07 DIAGNOSIS — Z79899 Other long term (current) drug therapy: Secondary | ICD-10-CM | POA: Diagnosis not present

## 2023-09-07 DIAGNOSIS — M51362 Other intervertebral disc degeneration, lumbar region with discogenic back pain and lower extremity pain: Secondary | ICD-10-CM | POA: Diagnosis not present

## 2023-09-07 DIAGNOSIS — Z79891 Long term (current) use of opiate analgesic: Secondary | ICD-10-CM | POA: Diagnosis not present

## 2023-09-07 DIAGNOSIS — M5451 Vertebrogenic low back pain: Secondary | ICD-10-CM | POA: Diagnosis not present

## 2023-09-07 DIAGNOSIS — G8929 Other chronic pain: Secondary | ICD-10-CM | POA: Diagnosis not present

## 2023-09-07 DIAGNOSIS — M961 Postlaminectomy syndrome, not elsewhere classified: Secondary | ICD-10-CM | POA: Diagnosis not present

## 2023-09-08 NOTE — Telephone Encounter (Signed)
-----   Message from Gaylyn Keas sent at 09/07/2023  2:55 PM EDT ----- Noncardiac portion of chest CT showed mild cardiac enlargement with a small fluid around the heart as well as cysts in the liver and kidneys.  There is aortic atherosclerosis.  Please send a copy of this report to her PCP for further evaluation

## 2023-09-08 NOTE — Telephone Encounter (Signed)
 Call to patient to discuss results of Noncardiac portion of chest CT, which showed mild cardiac enlargement with a small fluid around the heart as well as cysts in the liver and kidneys. Patient states she is aware of cysts and will follow with PCP. Also forwarded results to PCP. There is aortic atherosclerosis.  Patient verbalizes understanding.

## 2023-09-20 DIAGNOSIS — R931 Abnormal findings on diagnostic imaging of heart and coronary circulation: Secondary | ICD-10-CM

## 2023-09-20 HISTORY — DX: Abnormal findings on diagnostic imaging of heart and coronary circulation: R93.1

## 2023-09-23 DIAGNOSIS — M810 Age-related osteoporosis without current pathological fracture: Secondary | ICD-10-CM | POA: Diagnosis not present

## 2023-09-23 DIAGNOSIS — Z681 Body mass index (BMI) 19 or less, adult: Secondary | ICD-10-CM | POA: Diagnosis not present

## 2023-09-23 DIAGNOSIS — K7689 Other specified diseases of liver: Secondary | ICD-10-CM | POA: Diagnosis not present

## 2023-09-24 ENCOUNTER — Ambulatory Visit (HOSPITAL_COMMUNITY)
Admission: RE | Admit: 2023-09-24 | Discharge: 2023-09-24 | Disposition: A | Discharge status: Home / Self Care | Source: Ambulatory Visit | Attending: Internal Medicine | Admitting: Internal Medicine

## 2023-09-24 ENCOUNTER — Ambulatory Visit: Payer: Self-pay | Admitting: Cardiology

## 2023-09-24 ENCOUNTER — Encounter: Payer: Self-pay | Admitting: Cardiology

## 2023-09-24 DIAGNOSIS — Z79899 Other long term (current) drug therapy: Secondary | ICD-10-CM

## 2023-09-24 DIAGNOSIS — Q2112 Patent foramen ovale: Secondary | ICD-10-CM | POA: Diagnosis not present

## 2023-09-24 DIAGNOSIS — I2510 Atherosclerotic heart disease of native coronary artery without angina pectoris: Secondary | ICD-10-CM

## 2023-09-24 LAB — ECHOCARDIOGRAM LIMITED BUBBLE STUDY

## 2023-09-25 ENCOUNTER — Ambulatory Visit (HOSPITAL_COMMUNITY)
Admission: RE | Admit: 2023-09-25 | Discharge: 2023-09-25 | Disposition: A | Discharge status: Home / Self Care | Source: Ambulatory Visit | Attending: Cardiology | Admitting: Cardiology

## 2023-09-25 ENCOUNTER — Ambulatory Visit (HOSPITAL_COMMUNITY)
Admission: RE | Admit: 2023-09-25 | Discharge: 2023-09-25 | Disposition: A | Discharge status: Home / Self Care | Payer: Self-pay | Source: Ambulatory Visit | Attending: Cardiology | Admitting: Cardiology

## 2023-09-25 DIAGNOSIS — K7689 Other specified diseases of liver: Secondary | ICD-10-CM | POA: Insufficient documentation

## 2023-09-25 DIAGNOSIS — I2510 Atherosclerotic heart disease of native coronary artery without angina pectoris: Secondary | ICD-10-CM | POA: Insufficient documentation

## 2023-09-28 ENCOUNTER — Encounter: Payer: Self-pay | Admitting: Cardiology

## 2023-09-29 NOTE — Telephone Encounter (Signed)
 Attempted to call patient, no answer. Left message with no identifiers asking recipient to call [place] at our office number.

## 2023-09-29 NOTE — Telephone Encounter (Signed)
 Call to patient to discuss several test results. Discussed that Coronary Ca score 63 with aortic atherosclerosis. Small to medium pericardial effusion seen. Echo also showed normal pumping function of the heart muscle with EF 65-70%, PFO and small pericardial effusion.  Continue current meds Patient agrees to come in for FLP and ALT, orders placed.   Multiple liver cysts also seen on ultrasound- forwarded to PCP for followup . Patient verbalizes understanding.

## 2023-09-29 NOTE — Addendum Note (Signed)
 Addended by: Cherylyn Cos on: 09/29/2023 03:57 PM   Modules accepted: Orders

## 2023-09-29 NOTE — Telephone Encounter (Signed)
-----   Message from Gaylyn Keas sent at 09/24/2023  6:45 PM EDT ----- Echo showed normal pumping function of the heart muscle with EF 65-70%, PFO and small pericardial effusion.  Continue current meds

## 2023-09-29 NOTE — Telephone Encounter (Signed)
-----   Message from Gaylyn Keas sent at 09/28/2023  3:59 PM EDT ----- Coronary Ca score 63 with aortic atherosclerosis. Small to medium pericardial effusion (small effusion by echo 09/2023).  Please have her come in for FLP and ALT. No ASA due to Eliquis  .  Stress test with no ischemia 08/2023

## 2023-09-30 ENCOUNTER — Telehealth: Payer: Self-pay | Admitting: Cardiology

## 2023-09-30 DIAGNOSIS — I2510 Atherosclerotic heart disease of native coronary artery without angina pectoris: Secondary | ICD-10-CM

## 2023-09-30 DIAGNOSIS — Z79899 Other long term (current) drug therapy: Secondary | ICD-10-CM

## 2023-09-30 NOTE — Telephone Encounter (Signed)
 Call patient about her message. She would like a copy of results. Will mail patient a copy of results.

## 2023-09-30 NOTE — Telephone Encounter (Signed)
 Pt is calling to speak to nurse Ardelia Beau because she has some questions about her results

## 2023-10-01 DIAGNOSIS — I2510 Atherosclerotic heart disease of native coronary artery without angina pectoris: Secondary | ICD-10-CM | POA: Diagnosis not present

## 2023-10-01 DIAGNOSIS — Z79899 Other long term (current) drug therapy: Secondary | ICD-10-CM | POA: Diagnosis not present

## 2023-10-01 LAB — LIPID PANEL: HDL: 65 mg/dL (ref >39)

## 2023-10-01 LAB — ALT: ALT: 19 IU/L (ref 0–32)

## 2023-10-06 ENCOUNTER — Ambulatory Visit: Payer: Self-pay | Admitting: Cardiology

## 2023-10-06 DIAGNOSIS — Z79899 Other long term (current) drug therapy: Secondary | ICD-10-CM

## 2023-10-06 DIAGNOSIS — E785 Hyperlipidemia, unspecified: Secondary | ICD-10-CM

## 2023-10-06 DIAGNOSIS — I2510 Atherosclerotic heart disease of native coronary artery without angina pectoris: Secondary | ICD-10-CM

## 2023-10-08 MED ORDER — ATORVASTATIN CALCIUM 20 MG PO TABS: 20.0000 mg | ORAL_TABLET | Freq: Every day | ORAL | 3 refills | Status: AC

## 2023-10-08 NOTE — Telephone Encounter (Signed)
 Call to patient to advise that LDL not at goal of < 70. Patient agrees to increase atorvastatin  to 20mg  daily and repeat FLP and ALT in 6 weeks, orders placed.

## 2023-10-08 NOTE — Telephone Encounter (Signed)
-----   Message from Gaylyn Keas sent at 10/06/2023  4:40 PM EDT ----- LDL not at goal of < 70.  Please increase atorvastatin  to 20mg  daily and repeat FLP and ALT in 6 weeks ----- Message ----- From: Interface, [facility] Lab Results In Sent: 10/01/2023  11:36 PM EDT To: Jacqueline Matsu, MD

## 2023-10-19 ENCOUNTER — Other Ambulatory Visit (HOSPITAL_COMMUNITY): Payer: Self-pay

## 2023-10-26 DIAGNOSIS — R1011 Right upper quadrant pain: Secondary | ICD-10-CM | POA: Diagnosis not present

## 2023-10-26 DIAGNOSIS — Z23 Encounter for immunization: Secondary | ICD-10-CM | POA: Diagnosis not present

## 2023-10-26 DIAGNOSIS — K7689 Other specified diseases of liver: Secondary | ICD-10-CM | POA: Diagnosis not present

## 2023-10-28 ENCOUNTER — Other Ambulatory Visit (HOSPITAL_COMMUNITY): Payer: Self-pay | Admitting: Family Medicine

## 2023-10-28 DIAGNOSIS — R1011 Right upper quadrant pain: Secondary | ICD-10-CM

## 2023-11-06 ENCOUNTER — Other Ambulatory Visit (HOSPITAL_COMMUNITY): Payer: Self-pay

## 2023-11-06 ENCOUNTER — Other Ambulatory Visit: Payer: Self-pay

## 2023-11-11 DIAGNOSIS — M069 Rheumatoid arthritis, unspecified: Secondary | ICD-10-CM | POA: Diagnosis not present

## 2023-11-11 DIAGNOSIS — M8588 Other specified disorders of bone density and structure, other site: Secondary | ICD-10-CM | POA: Diagnosis not present

## 2023-11-11 DIAGNOSIS — Z1231 Encounter for screening mammogram for malignant neoplasm of breast: Secondary | ICD-10-CM | POA: Diagnosis not present

## 2023-11-11 DIAGNOSIS — R29890 Loss of height: Secondary | ICD-10-CM | POA: Diagnosis not present

## 2023-11-11 DIAGNOSIS — Z7952 Long term (current) use of systemic steroids: Secondary | ICD-10-CM | POA: Diagnosis not present

## 2023-11-13 ENCOUNTER — Encounter (HOSPITAL_COMMUNITY)
Admission: RE | Admit: 2023-11-13 | Discharge: 2023-11-13 | Disposition: A | Discharge status: Home / Self Care | Source: Ambulatory Visit | Attending: Family Medicine | Admitting: Family Medicine

## 2023-11-13 DIAGNOSIS — R1011 Right upper quadrant pain: Secondary | ICD-10-CM | POA: Insufficient documentation

## 2023-11-13 DIAGNOSIS — R109 Unspecified abdominal pain: Secondary | ICD-10-CM | POA: Diagnosis not present

## 2023-11-13 MED ORDER — TECHNETIUM TC 99M MEBROFENIN IV KIT
5.0000 | PACK | Freq: Once | INTRAVENOUS | Status: AC | PRN
  Administered 2023-11-13: 5 via INTRAVENOUS

## 2023-11-16 DIAGNOSIS — E785 Hyperlipidemia, unspecified: Secondary | ICD-10-CM | POA: Diagnosis not present

## 2023-11-16 DIAGNOSIS — Z79899 Other long term (current) drug therapy: Secondary | ICD-10-CM | POA: Diagnosis not present

## 2023-11-16 DIAGNOSIS — I2510 Atherosclerotic heart disease of native coronary artery without angina pectoris: Secondary | ICD-10-CM | POA: Diagnosis not present

## 2023-11-16 LAB — ALT: ALT: 16 IU/L (ref 0–32)

## 2023-11-17 LAB — LIPID PANEL
Chol/HDL Ratio: 2.4 ratio (ref 0.0–4.4)
Cholesterol, Total: 142 mg/dL (ref 100–199)
HDL: 58 mg/dL (ref >39)
LDL Chol Calc (NIH): 70 mg/dL (ref 0–99)
Triglycerides: 67 mg/dL (ref 0–149)
VLDL Cholesterol Cal: 14 mg/dL (ref 5–40)

## 2023-11-18 NOTE — Telephone Encounter (Signed)
 Call to patient to advise per Dr. Shlomo, Lipids at goal. Patient verbalizes understanding to continue current therapy, forwarded to PCP.

## 2023-11-18 NOTE — Telephone Encounter (Signed)
-----   Message from Samantha Henderson sent at 11/17/2023  8:09 AM EDT ----- Lipids at goal continue current therapy and forward to PCP ----- Message ----- From: Interface, [facility] Lab Results In Sent: 11/16/2023  11:36 PM EDT To: Samantha JONELLE Bihari, MD

## 2023-11-19 DIAGNOSIS — Z79899 Other long term (current) drug therapy: Secondary | ICD-10-CM | POA: Diagnosis not present

## 2023-11-26 ENCOUNTER — Other Ambulatory Visit: Payer: Self-pay | Admitting: Family Medicine

## 2023-11-26 DIAGNOSIS — L570 Actinic keratosis: Secondary | ICD-10-CM | POA: Diagnosis not present

## 2023-11-26 DIAGNOSIS — L853 Xerosis cutis: Secondary | ICD-10-CM | POA: Diagnosis not present

## 2023-11-26 DIAGNOSIS — D3612 Benign neoplasm of peripheral nerves and autonomic nervous system, upper limb, including shoulder: Secondary | ICD-10-CM | POA: Diagnosis not present

## 2023-11-26 DIAGNOSIS — L820 Inflamed seborrheic keratosis: Secondary | ICD-10-CM | POA: Diagnosis not present

## 2023-11-26 DIAGNOSIS — D1801 Hemangioma of skin and subcutaneous tissue: Secondary | ICD-10-CM | POA: Diagnosis not present

## 2023-11-26 DIAGNOSIS — M810 Age-related osteoporosis without current pathological fracture: Secondary | ICD-10-CM | POA: Insufficient documentation

## 2023-11-26 DIAGNOSIS — L918 Other hypertrophic disorders of the skin: Secondary | ICD-10-CM | POA: Diagnosis not present

## 2023-11-26 DIAGNOSIS — L821 Other seborrheic keratosis: Secondary | ICD-10-CM | POA: Diagnosis not present

## 2023-11-27 ENCOUNTER — Telehealth: Payer: Self-pay

## 2023-11-27 NOTE — Telephone Encounter (Signed)
 Auth Submission: NO AUTH NEEDED Site of care: Site of care: [facility] Payer: [employer] Medication & CPT/J Code(s) submitted: Reclast (Zolendronic acid) [id number] Diagnosis Code:  Route of submission (phone, fax, portal):  Phone # Fax # Auth type: Buy/Bill PB Units/visits requested: 5mg  x 1 dose Reference number:  Approval from: 11/27/23 to 04/20/24

## 2023-12-03 ENCOUNTER — Ambulatory Visit (INDEPENDENT_AMBULATORY_CARE_PROVIDER_SITE_OTHER)

## 2023-12-03 VITALS — BP 132/61 | HR 53 | Temp 98.1°F | Resp 20 | Ht 66.0 in | Wt 116.4 lb

## 2023-12-03 DIAGNOSIS — M810 Age-related osteoporosis without current pathological fracture: Secondary | ICD-10-CM | POA: Diagnosis not present

## 2023-12-03 MED ORDER — DIPHENHYDRAMINE HCL 25 MG PO CAPS
25.0000 mg | ORAL_CAPSULE | Freq: Once | ORAL | Status: AC
  Administered 2023-12-03: 25 mg via ORAL
  Filled 2023-12-03: qty 1

## 2023-12-03 MED ORDER — ACETAMINOPHEN 325 MG PO TABS
650.0000 mg | ORAL_TABLET | Freq: Once | ORAL | Status: AC
  Administered 2023-12-03: 650 mg via ORAL
  Filled 2023-12-03: qty 2

## 2023-12-03 MED ORDER — SODIUM CHLORIDE 0.9 % IV SOLN: INTRAVENOUS | Status: DC

## 2023-12-03 MED ORDER — ZOLEDRONIC ACID 5 MG/100ML IV SOLN
5.0000 mg | Freq: Once | INTRAVENOUS | Status: AC
  Administered 2023-12-03: 5 mg via INTRAVENOUS
  Filled 2023-12-03: qty 100

## 2023-12-03 NOTE — Progress Notes (Signed)
 Diagnosis: Osteoporosis  Provider:  Mannam, Praveen MD  Procedure: IV Infusion  IV Type: Peripheral, IV Location: R Forearm  Reclast (Zolendronic Acid), Dose: 5 mg  Infusion Start Time: 0929  Infusion Stop Time: 0959  Post Infusion IV Care: Observation period completed and Peripheral IV Discontinued  Discharge: Condition: Stable, Destination: Home . AVS Provided  Performed by:  Rocky FORBES Sar, RN

## 2023-12-08 DIAGNOSIS — E785 Hyperlipidemia, unspecified: Secondary | ICD-10-CM | POA: Diagnosis not present

## 2023-12-08 DIAGNOSIS — G479 Sleep disorder, unspecified: Secondary | ICD-10-CM | POA: Diagnosis not present

## 2023-12-08 DIAGNOSIS — Z0000 Encounter for general adult medical examination without abnormal findings: Secondary | ICD-10-CM | POA: Diagnosis not present

## 2023-12-08 DIAGNOSIS — N1832 Chronic kidney disease, stage 3b: Secondary | ICD-10-CM | POA: Diagnosis not present

## 2023-12-08 DIAGNOSIS — Z23 Encounter for immunization: Secondary | ICD-10-CM | POA: Diagnosis not present

## 2023-12-08 DIAGNOSIS — Z1331 Encounter for screening for depression: Secondary | ICD-10-CM | POA: Diagnosis not present

## 2023-12-08 DIAGNOSIS — R350 Frequency of micturition: Secondary | ICD-10-CM | POA: Diagnosis not present

## 2023-12-08 DIAGNOSIS — M810 Age-related osteoporosis without current pathological fracture: Secondary | ICD-10-CM | POA: Diagnosis not present

## 2024-01-07 DIAGNOSIS — Z79891 Long term (current) use of opiate analgesic: Secondary | ICD-10-CM | POA: Diagnosis not present

## 2024-01-07 DIAGNOSIS — M25562 Pain in left knee: Secondary | ICD-10-CM | POA: Diagnosis not present

## 2024-01-07 DIAGNOSIS — M5451 Vertebrogenic low back pain: Secondary | ICD-10-CM | POA: Diagnosis not present

## 2024-01-07 DIAGNOSIS — M961 Postlaminectomy syndrome, not elsewhere classified: Secondary | ICD-10-CM | POA: Diagnosis not present

## 2024-01-13 ENCOUNTER — Emergency Department (HOSPITAL_COMMUNITY)

## 2024-01-13 ENCOUNTER — Encounter (HOSPITAL_COMMUNITY): Payer: Self-pay

## 2024-01-13 ENCOUNTER — Other Ambulatory Visit: Payer: Self-pay

## 2024-01-13 ENCOUNTER — Inpatient Hospital Stay (HOSPITAL_COMMUNITY)
Admission: EM | Admit: 2024-01-13 | Discharge: 2024-01-17 | DRG: 481 | Disposition: A | Discharge status: Skilled Nursing Facility | Attending: Internal Medicine | Admitting: Internal Medicine

## 2024-01-13 DIAGNOSIS — M6281 Muscle weakness (generalized): Secondary | ICD-10-CM | POA: Diagnosis not present

## 2024-01-13 DIAGNOSIS — Z882 Allergy status to sulfonamides status: Secondary | ICD-10-CM

## 2024-01-13 DIAGNOSIS — Z8249 Family history of ischemic heart disease and other diseases of the circulatory system: Secondary | ICD-10-CM | POA: Diagnosis not present

## 2024-01-13 DIAGNOSIS — Z66 Do not resuscitate: Secondary | ICD-10-CM | POA: Diagnosis not present

## 2024-01-13 DIAGNOSIS — Z7901 Long term (current) use of anticoagulants: Secondary | ICD-10-CM

## 2024-01-13 DIAGNOSIS — T8484XA Pain due to internal orthopedic prosthetic devices, implants and grafts, initial encounter: Secondary | ICD-10-CM | POA: Diagnosis not present

## 2024-01-13 DIAGNOSIS — I739 Peripheral vascular disease, unspecified: Secondary | ICD-10-CM | POA: Diagnosis not present

## 2024-01-13 DIAGNOSIS — Y92018 Other place in single-family (private) house as the place of occurrence of the external cause: Secondary | ICD-10-CM | POA: Diagnosis not present

## 2024-01-13 DIAGNOSIS — M160 Bilateral primary osteoarthritis of hip: Secondary | ICD-10-CM | POA: Diagnosis not present

## 2024-01-13 DIAGNOSIS — Q2112 Patent foramen ovale: Secondary | ICD-10-CM | POA: Diagnosis not present

## 2024-01-13 DIAGNOSIS — I4819 Other persistent atrial fibrillation: Secondary | ICD-10-CM | POA: Diagnosis not present

## 2024-01-13 DIAGNOSIS — S72001A Fracture of unspecified part of neck of right femur, initial encounter for closed fracture: Principal | ICD-10-CM | POA: Insufficient documentation

## 2024-01-13 DIAGNOSIS — S72001D Fracture of unspecified part of neck of right femur, subsequent encounter for closed fracture with routine healing: Secondary | ICD-10-CM | POA: Diagnosis not present

## 2024-01-13 DIAGNOSIS — E785 Hyperlipidemia, unspecified: Secondary | ICD-10-CM | POA: Diagnosis not present

## 2024-01-13 DIAGNOSIS — I272 Pulmonary hypertension, unspecified: Secondary | ICD-10-CM | POA: Diagnosis present

## 2024-01-13 DIAGNOSIS — E876 Hypokalemia: Secondary | ICD-10-CM | POA: Diagnosis not present

## 2024-01-13 DIAGNOSIS — Z823 Family history of stroke: Secondary | ICD-10-CM

## 2024-01-13 DIAGNOSIS — W1789XA Other fall from one level to another, initial encounter: Secondary | ICD-10-CM | POA: Diagnosis present

## 2024-01-13 DIAGNOSIS — I083 Combined rheumatic disorders of mitral, aortic and tricuspid valves: Secondary | ICD-10-CM | POA: Diagnosis not present

## 2024-01-13 DIAGNOSIS — K5909 Other constipation: Secondary | ICD-10-CM | POA: Diagnosis present

## 2024-01-13 DIAGNOSIS — R2689 Other abnormalities of gait and mobility: Secondary | ICD-10-CM | POA: Diagnosis not present

## 2024-01-13 DIAGNOSIS — S99921A Unspecified injury of right foot, initial encounter: Secondary | ICD-10-CM | POA: Diagnosis not present

## 2024-01-13 DIAGNOSIS — W19XXXA Unspecified fall, initial encounter: Secondary | ICD-10-CM | POA: Diagnosis not present

## 2024-01-13 DIAGNOSIS — S80919A Unspecified superficial injury of unspecified knee, initial encounter: Secondary | ICD-10-CM | POA: Diagnosis not present

## 2024-01-13 DIAGNOSIS — S72009A Fracture of unspecified part of neck of unspecified femur, initial encounter for closed fracture: Secondary | ICD-10-CM | POA: Diagnosis present

## 2024-01-13 DIAGNOSIS — Z96651 Presence of right artificial knee joint: Secondary | ICD-10-CM | POA: Diagnosis not present

## 2024-01-13 DIAGNOSIS — M25561 Pain in right knee: Secondary | ICD-10-CM | POA: Diagnosis present

## 2024-01-13 DIAGNOSIS — Z888 Allergy status to other drugs, medicaments and biological substances status: Secondary | ICD-10-CM

## 2024-01-13 DIAGNOSIS — M810 Age-related osteoporosis without current pathological fracture: Secondary | ICD-10-CM | POA: Diagnosis present

## 2024-01-13 DIAGNOSIS — N179 Acute kidney failure, unspecified: Secondary | ICD-10-CM | POA: Diagnosis present

## 2024-01-13 DIAGNOSIS — S72011A Unspecified intracapsular fracture of right femur, initial encounter for closed fracture: Secondary | ICD-10-CM | POA: Diagnosis not present

## 2024-01-13 DIAGNOSIS — Z79890 Hormone replacement therapy (postmenopausal): Secondary | ICD-10-CM

## 2024-01-13 DIAGNOSIS — M353 Polymyalgia rheumatica: Secondary | ICD-10-CM | POA: Diagnosis not present

## 2024-01-13 DIAGNOSIS — I10 Essential (primary) hypertension: Secondary | ICD-10-CM | POA: Diagnosis not present

## 2024-01-13 DIAGNOSIS — S79919A Unspecified injury of unspecified hip, initial encounter: Secondary | ICD-10-CM | POA: Diagnosis not present

## 2024-01-13 DIAGNOSIS — Z043 Encounter for examination and observation following other accident: Secondary | ICD-10-CM | POA: Diagnosis not present

## 2024-01-13 DIAGNOSIS — N1831 Chronic kidney disease, stage 3a: Secondary | ICD-10-CM | POA: Diagnosis present

## 2024-01-13 DIAGNOSIS — Z881 Allergy status to other antibiotic agents status: Secondary | ICD-10-CM

## 2024-01-13 DIAGNOSIS — Z90710 Acquired absence of both cervix and uterus: Secondary | ICD-10-CM

## 2024-01-13 DIAGNOSIS — Z96659 Presence of unspecified artificial knee joint: Secondary | ICD-10-CM | POA: Diagnosis not present

## 2024-01-13 DIAGNOSIS — Z7401 Bed confinement status: Secondary | ICD-10-CM | POA: Diagnosis not present

## 2024-01-13 DIAGNOSIS — F39 Unspecified mood [affective] disorder: Secondary | ICD-10-CM | POA: Diagnosis present

## 2024-01-13 DIAGNOSIS — I5032 Chronic diastolic (congestive) heart failure: Secondary | ICD-10-CM | POA: Diagnosis not present

## 2024-01-13 DIAGNOSIS — R2681 Unsteadiness on feet: Secondary | ICD-10-CM | POA: Diagnosis not present

## 2024-01-13 DIAGNOSIS — M1611 Unilateral primary osteoarthritis, right hip: Secondary | ICD-10-CM | POA: Diagnosis not present

## 2024-01-13 DIAGNOSIS — I130 Hypertensive heart and chronic kidney disease with heart failure and stage 1 through stage 4 chronic kidney disease, or unspecified chronic kidney disease: Secondary | ICD-10-CM | POA: Diagnosis present

## 2024-01-13 DIAGNOSIS — E039 Hypothyroidism, unspecified: Secondary | ICD-10-CM | POA: Diagnosis present

## 2024-01-13 DIAGNOSIS — I480 Paroxysmal atrial fibrillation: Secondary | ICD-10-CM

## 2024-01-13 DIAGNOSIS — M316 Other giant cell arteritis: Secondary | ICD-10-CM | POA: Diagnosis not present

## 2024-01-13 DIAGNOSIS — Y93E9 Activity, other interior property and clothing maintenance: Secondary | ICD-10-CM | POA: Diagnosis not present

## 2024-01-13 DIAGNOSIS — I4891 Unspecified atrial fibrillation: Secondary | ICD-10-CM | POA: Diagnosis not present

## 2024-01-13 DIAGNOSIS — Z79899 Other long term (current) drug therapy: Secondary | ICD-10-CM

## 2024-01-13 DIAGNOSIS — I080 Rheumatic disorders of both mitral and aortic valves: Secondary | ICD-10-CM | POA: Diagnosis not present

## 2024-01-13 DIAGNOSIS — Z880 Allergy status to penicillin: Secondary | ICD-10-CM

## 2024-01-13 DIAGNOSIS — M25551 Pain in right hip: Secondary | ICD-10-CM | POA: Diagnosis present

## 2024-01-13 DIAGNOSIS — R609 Edema, unspecified: Secondary | ICD-10-CM | POA: Diagnosis not present

## 2024-01-13 LAB — COMPREHENSIVE METABOLIC PANEL WITH GFR
ALT: 24 U/L (ref 0–44)
AST: 25 U/L (ref 15–41)
Albumin: 4.1 g/dL (ref 3.5–5.0)
Alkaline Phosphatase: 39 U/L (ref 38–126)
Anion gap: 11 (ref 5–15)
BUN: 28 mg/dL — ABNORMAL HIGH (ref 8–23)
CO2: 25 mmol/L (ref 22–32)
Calcium: 10 mg/dL (ref 8.9–10.3)
Chloride: 103 mmol/L (ref 98–111)
Creatinine, Ser: 1.57 mg/dL — ABNORMAL HIGH (ref 0.44–1.00)
GFR, Estimated: 32 mL/min — ABNORMAL LOW (ref >60)
Glucose, Bld: 101 mg/dL — ABNORMAL HIGH (ref 70–99)
Potassium: 3.1 mmol/L — ABNORMAL LOW (ref 3.5–5.1)
Sodium: 139 mmol/L (ref 135–145)
Total Bilirubin: 1.3 mg/dL — ABNORMAL HIGH (ref 0.0–1.2)
Total Protein: 6.4 g/dL — ABNORMAL LOW (ref 6.5–8.1)

## 2024-01-13 LAB — CBC WITH DIFFERENTIAL/PLATELET
Abs Immature Granulocytes: 0.05 K/uL (ref 0.00–0.07)
Basophils Absolute: 0 K/uL (ref 0.0–0.1)
Basophils Relative: 0 %
Eosinophils Absolute: 0.1 K/uL (ref 0.0–0.5)
Eosinophils Relative: 1 %
HCT: 39.5 % (ref 36.0–46.0)
Hemoglobin: 13.4 g/dL (ref 12.0–15.0)
Immature Granulocytes: 1 %
Lymphocytes Relative: 19 %
Lymphs Abs: 1.6 K/uL (ref 0.7–4.0)
MCH: 30 pg (ref 26.0–34.0)
MCHC: 33.9 g/dL (ref 30.0–36.0)
MCV: 88.4 fL (ref 80.0–100.0)
Monocytes Absolute: 0.5 K/uL (ref 0.1–1.0)
Monocytes Relative: 6 %
Neutro Abs: 6.3 K/uL (ref 1.7–7.7)
Neutrophils Relative %: 73 %
Platelets: UNDETERMINED K/uL (ref 150–400)
RBC: 4.47 MIL/uL (ref 3.87–5.11)
RDW: 12.7 % (ref 11.5–15.5)
WBC: 8.5 K/uL (ref 4.0–10.5)
nRBC: 0 % (ref 0.0–0.2)

## 2024-01-13 LAB — MAGNESIUM: Magnesium: 2 mg/dL (ref 1.7–2.4)

## 2024-01-13 MED ORDER — MORPHINE SULFATE (PF) 4 MG/ML IV SOLN
4.0000 mg | Freq: Once | INTRAVENOUS | Status: AC
  Administered 2024-01-13: 4 mg via INTRAVENOUS
  Filled 2024-01-13: qty 1

## 2024-01-13 MED ORDER — ONDANSETRON HCL 4 MG/2ML IJ SOLN
4.0000 mg | Freq: Once | INTRAMUSCULAR | Status: AC
  Administered 2024-01-13: 4 mg via INTRAVENOUS
  Filled 2024-01-13: qty 2

## 2024-01-13 MED ORDER — POTASSIUM CHLORIDE 20 MEQ PO PACK
60.0000 meq | PACK | Freq: Every day | ORAL | Status: DC
  Filled 2024-01-13: qty 3

## 2024-01-13 MED ORDER — HYDROMORPHONE HCL 1 MG/ML IJ SOLN
0.5000 mg | Freq: Once | INTRAMUSCULAR | Status: AC
  Administered 2024-01-13: 0.5 mg via INTRAVENOUS
  Filled 2024-01-13: qty 1

## 2024-01-13 MED ORDER — POTASSIUM CHLORIDE 10 MEQ/100ML IV SOLN
10.0000 meq | Freq: Once | INTRAVENOUS | Status: AC
  Administered 2024-01-13: 10 meq via INTRAVENOUS
  Filled 2024-01-13: qty 100

## 2024-01-13 NOTE — H&P (Signed)
 History and Physical    Samantha Henderson FMW:[id number] DOB: Jan 04, 1935 DOA: 01/13/2024  PCP: Cleotilde Planas, MD  Patient coming from: Home  Chief Complaint: Fall  HPI: Samantha Henderson is a 89 y.o. female with medical history significant of paroxysmal A-fib on Eliquis , HFpEF, PFO, hypertension, hyperlipidemia, hypothyroidism, left femoral neck fracture status post left hip pinning in December 2017, right total knee replacement in January 2025 polymyalgia rheumatica, temporal arteritis presenting with complaints of pain in her right hip and knee after a mechanical fall.  ED Course: Vital signs stable on arrival.  CBC showing normal WBC count and normal hemoglobin.  Platelet count could not be calculated due to clumps noted on smear.  CMP notable for potassium 3.1, BUN 28, creatinine 1.57 (baseline 1.0-1.2), magnesium  2.0.  X-ray of right hip/pelvis showing impacted subcapital right femoral neck fracture.  Chest x-ray and x-ray of right knee showing no acute abnormalities.  Orthopedics consulted.  Patient was given Dilaudid , morphine , Zofran , and IV potassium 10 mEq x 1.  Review of Systems:  ROS  Past Medical History:  Diagnosis Date   Abdominal bruit    Abdominal bruit in the past no aortic aneurysm by evaluation in the past   Agatston coronary artery calcium  score less than 100 09/2023   Coronary Ca score 63   Aortic atherosclerosis    Aortic regurgitation    Mild by echo 08/2023   Arm weakness    Arm weakness with exercise, January, 2012   Arthritis    Benign essential HTN 01/27/2017   Bradycardia    Carotid stenosis    1-39% bilateral by dopplers 09/2019   Constipation    Constipation with diltiazem  March, 2012   Diastolic dysfunction    Mild   Dizziness    HTN (hypertension)    Hypothyroidism    Leg cramps    from lipitor in the past   Mitral regurgitation 11/15/2018   mild echo 08/2023   PAC (premature atrial contraction)    Palpitation    Pericardial effusion    mild on echo  10/2022   PFO (patent foramen ovale)    Polymyalgia    Pulmonary hypertension (HCC)    Rheumatic fever    Questionable history of rheumatic fever, but no history of valvular abnormalities   Sinus tachycardia    Temporal arteritis (HCC)    Rule out temporal arteritis in the past   Tricuspid regurgitation    Moderate by echo 08/2023   Varicose veins of both lower extremities     Past Surgical History:  Procedure Laterality Date   BACK SURGERY     BUNIONECTOMY     HIP PINNING,CANNULATED Left 04/12/2016   Procedure: CANNULATED HIP PINNING;  Surgeon: Fonda Olmsted, MD;  Location: [facility] OR;  Service: Orthopedics;  Laterality: Left;   NECK SURGERY     TONSILLECTOMY     TOTAL ABDOMINAL HYSTERECTOMY     TOTAL KNEE ARTHROPLASTY Right 05/13/2022   Procedure: TOTAL KNEE ARTHROPLASTY;  Surgeon: Olmsted Fonda, MD;  Location: [facility];  Service: Orthopedics;  Laterality: Right;     reports that she has never smoked. She has never used smokeless tobacco. She reports that she does not drink alcohol and does not use drugs.  Allergies  Allergen Reactions   Niacin And Related Palpitations   Amlodipine  Swelling   Cefdinir Swelling    Patient tolerated Ancef  on 05/13/2022   Celecoxib Rash   Codeine Nausea And Vomiting   Nebivolol Hcl Swelling   Pregabalin  Swelling   Ciprofloxacin Swelling    Hives  Tongue Sweeling   Lisinopril Cough   Plaquenil  [Hydroxychloroquine ] Other (See Comments)    Low platelets   Sulfamethoxazole-Trimethoprim     Rash   Augmentin [Amoxicillin-Pot Clavulanate] Rash   Cephalexin Rash    Patient tolerated Ancef  on 05/13/2022   Sulfonamide Derivatives Rash    Rash    Family History  Problem Relation Age of Onset   Heart disease Mother    Stroke Mother     Prior to Admission medications   Medication Sig Start Date End Date Taking? Authorizing Provider  apixaban  (ELIQUIS ) 2.5 MG TABS tablet Take 1 tablet (2.5 mg total) by mouth 2 (two) times daily. 08/10/23  Yes  Turner, Wilbert SAUNDERS, MD  atorvastatin  (LIPITOR) 20 MG tablet Take 1 tablet (20 mg total) by mouth daily. 10/08/23 01/12/25 Yes Turner, Wilbert SAUNDERS, MD  Biotin  10 MG CAPS Take 10 mg by mouth daily.   Yes [provider]  calcium  carbonate (TUMS EX) 750 MG chewable tablet Chew 1 tablet by mouth daily as needed for heartburn.   Yes [provider]  Cholecalciferol  (VITAMIN D3) 125 MCG (5000 UT) CAPS Take 1 capsule by mouth daily.    Yes [provider]  diltiazem  (CARDIZEM  CD) 120 MG 24 hr capsule Take 1 capsule (120 mg total) by mouth daily. 07/15/23  Yes Turner, Wilbert SAUNDERS, MD  diltiazem  (CARDIZEM ) 30 MG tablet Take 1 tablet (30 mg total) by mouth daily as needed. For breakthrough atrial fibrillation. 10/13/22  Yes Turner, Wilbert SAUNDERS, MD  hydrochlorothiazide  (MICROZIDE ) 12.5 MG capsule Take 1 capsule (12.5 mg total) by mouth daily. 07/15/23  Yes Turner, Wilbert SAUNDERS, MD  levothyroxine  (SYNTHROID ) 100 MCG tablet Take 1 tablet (100 mcg total) by mouth daily. 07/13/23  Yes Shamleffer, Ibtehal Jaralla, MD  Magnesium  250 MG TABS Take 250 mg by mouth daily.   Yes [provider]  Melatonin 10 MG TABS Take 10 mg by mouth at bedtime.   Yes [provider]  Omega-3 Fatty Acids (OMEGA-3 FISH OIL PO) Take 1 capsule by mouth daily.   Yes [provider]  polyethylene glycol (MIRALAX  / GLYCOLAX ) packet Take 17 g by mouth daily.   Yes [provider]  traMADol  (ULTRAM ) 50 MG tablet Take 50 mg by mouth 4 (four) times daily as needed for moderate pain (pain score 4-6). 01/05/23  Yes [provider]  traZODone  (DESYREL ) 50 MG tablet Take 50 mg by mouth at bedtime.   Yes [provider]    Physical Exam: Vitals:   01/13/24 2218 01/13/24 2219 01/13/24 2220 01/13/24 2221  BP:      Pulse: 66 66 65 66  Resp: (!) 25 (!) 26 (!) 26 19  Temp:      TempSrc:      SpO2: 100% 100% 100% 100%  Weight:      Height:        Physical Exam  Labs on Admission: I have  personally reviewed following labs and imaging studies  CBC: Recent Labs  Lab 01/13/24 1750  WBC 8.5  NEUTROABS 6.3  HGB 13.4  HCT 39.5  MCV 88.4  PLT PLATELET CLUMPS NOTED ON SMEAR, UNABLE TO ESTIMATE   Basic Metabolic Panel: Recent Labs  Lab 01/13/24 2010  NA 139  K 3.1*  CL 103  CO2 25  GLUCOSE 101*  BUN 28*  CREATININE 1.57*  CALCIUM  10.0  MG 2.0   GFR: Estimated Creatinine Clearance: 21.2 mL/min (  A) (by C-G formula based on SCr of 1.57 mg/dL (H)). Liver Function Tests: Recent Labs  Lab 01/13/24 2010  AST 25  ALT 24  ALKPHOS 39  BILITOT 1.3*  PROT 6.4*  ALBUMIN 4.1   No results for input(s): LIPASE, AMYLASE in the last 168 hours. No results for input(s): AMMONIA in the last 168 hours. Coagulation Profile: No results for input(s): INR, PROTIME in the last 168 hours. Cardiac Enzymes: No results for input(s): CKTOTAL, CKMB, CKMBINDEX, TROPONINI in the last 168 hours. BNP (last 3 results) No results for input(s): PROBNP in the last 8760 hours. HbA1C: No results for input(s): HGBA1C in the last 72 hours. CBG: No results for input(s): GLUCAP in the last 168 hours. Lipid Profile: No results for input(s): CHOL, HDL, LDLCALC, TRIG, CHOLHDL, LDLDIRECT in the last 72 hours. Thyroid  Function Tests: No results for input(s): TSH, T4TOTAL, FREET4, T3FREE, THYROIDAB in the last 72 hours. Anemia Panel: No results for input(s): VITAMINB12, FOLATE, FERRITIN, TIBC, IRON, RETICCTPCT in the last 72 hours. Urine analysis:    Component Value Date/Time   COLORURINE YELLOW 05/23/2022 1732   APPEARANCEUR CLEAR 05/23/2022 1732   LABSPEC 1.018 05/23/2022 1732   PHURINE 7.0 05/23/2022 1732   GLUCOSEU NEGATIVE 05/23/2022 1732   HGBUR NEGATIVE 05/23/2022 1732   BILIRUBINUR NEGATIVE 05/23/2022 1732   KETONESUR 20 (A) 05/23/2022 1732   PROTEINUR NEGATIVE 05/23/2022 1732   UROBILINOGEN 0.2 06/15/2007 2117   NITRITE  NEGATIVE 05/23/2022 1732   LEUKOCYTESUR MODERATE (A) 05/23/2022 1732    Radiological Exams on Admission: DG Knee Complete 4 Views Right Result Date: 01/13/2024 EXAM: 4 VIEW(S) XRAY OF THE KNEE 01/13/2024 06:46:00 PM COMPARISON: None available. CLINICAL HISTORY: Fall. EMS reports they arrive to patient's house due to mechanical fall from a 3 ft table that she was kneeling on about to stand to dust the fall. FINDINGS: BONES AND JOINTS: Total knee arthroplasty hardware in place. No acute fracture. No focal osseous lesion. No joint dislocation. No significant joint effusion. SOFT TISSUES: The soft tissues are unremarkable. IMPRESSION: 1. No acute abnormalities. Electronically signed by: Pinkie Pebbles MD 01/13/2024 07:10 PM EDT RP Workstation: [id number]   DG Chest 1 View Result Date: 01/13/2024 EXAM: 1 VIEW(S) XRAY OF THE CHEST 01/13/2024 06:46:00 PM COMPARISON: 06/15/2007 CLINICAL HISTORY: Fall. EMS reports they arrive to patient's house due to mechanical fall from a 3 ft table that she was kneeling on about to stand to dust the fall. FINDINGS: LUNGS AND PLEURA: No focal pulmonary opacity. No pulmonary edema. No pleural effusion. No pneumothorax. HEART AND MEDIASTINUM: No acute abnormality of the cardiac and mediastinal silhouettes. BONES AND SOFT TISSUES: No acute osseous abnormality. IMPRESSION: 1. No acute abnormalities. Electronically signed by: Pinkie Pebbles MD 01/13/2024 07:05 PM EDT RP Workstation: [id number]   DG Hip Unilat W or Wo Pelvis 2-3 Views Right Result Date: 01/13/2024 EXAM: 3 VIEW(S) XRAY OF THE BILATERAL HIP 01/13/2024 06:46:00 PM COMPARISON: None available. CLINICAL HISTORY: fall. Table formatting from the original note was not included.; Images from the original note were not included.; EMS reports they arrive to pts house due to mechanical fall from a 27ft table that she was kneeling on about to stand to dust the fall. FINDINGS: BONES AND JOINTS: Right hip: Impacted subcapital  fracture. Left hip: Status post ORIF (Open Reduction Internal Fixation). Bilateral hips: Mild degenerative changes. SOFT TISSUES: The soft tissues are unremarkable. IMPRESSION: 1. Impacted subcapital right femoral neck fracture. Electronically signed by: Pinkie Pebbles MD 01/13/2024 07:03 PM EDT RP  Workstation: [id number]    EKG: Independently reviewed.  Sinus rhythm with first-degree AV block and no acute ischemic changes.  Assessment and Plan  Right femoral neck fracture Secondary to a mechanical fall.  Orthopedics consulting, appreciate recommendations.  Keep n.p.o. after midnight in anticipation of surgery in the morning.  Hold Eliquis .  Bedrest until surgery tomorrow.  Continue pain management: Hydrocodone  PRN rather than oxycodone  for oral pain control due to patient having history of significant nausea with oxycodone  after her knee replacement surgery.    Echo bubble study done 09/24/2023 showing EF 65 to 70%, small pericardial effusion, interatrial shunt, and no significant valvular abnormalities.  paroxysmal A-fib on Eliquis , HFpEF, PFO, hypertension, hyperlipidemia, hypothyroidism, left femoral neck fracture status post left hip pinning in December 2017, right total knee replacement in January 2025 polymyalgia rheumatica, temporal arteritis presenting with complaints of pain in her right hip and knee after a mechanical fall.  ED Course: Vital signs stable on arrival.  CBC showing normal WBC count and normal hemoglobin.  Platelet count could not be calculated due to clumps noted on smear.  CMP notable for potassium 3.1, BUN 28, creatinine 1.57 (baseline 1.0-1.2), magnesium  2.0.  X-ray of right hip/pelvis showing impacted subcapital right femoral neck fracture.  Chest x-ray and x-ray of right knee showing no acute abnormalities.  Orthopedics consulted.  Patient was given Dilaudid , morphine , Zofran , and IV potassium 10 mEq x 1.  DVT prophylaxis: {Blank single:19197::Lovenox,SQ  Heparin,IV heparin gtt,Xarelto,Eliquis ,Coumadin,SCDs,***} Code Status: {Blank single:19197::Full Code,DNR,DNR/DNI,Comfort Care,***} Family Communication: ***  Consults called: ***  Level of care: {Blank single:19197::Med-Surg,Telemetry bed,Progressive Care Unit,Step Down Unit} Admission status: *** Time Spent: 75+ minutes***  Editha Ram MD [facility]  If 7PM-7AM, please contact night-coverage [email or web address]  01/13/2024, 11:28 PM

## 2024-01-13 NOTE — ED Provider Notes (Signed)
 [place] EMERGENCY DEPARTMENT AT [facility] Provider Note   CSN: [id number] Arrival date & time: 01/13/24  1708     Patient presents with: Samantha Henderson is a 89 y.o. female.   89 yo F with a chief complaint of a fall.  The patient said she was standing on a bench to try and dust the ceiling fan and she lost her balance and fell onto her right side.  Pain mostly of right hip and right knee pain.  She denies head injury denies loss consciousness denies neck pain back pain abdominal pain chest pain.   Fall       Prior to Admission medications   Medication Sig Start Date End Date Taking? Authorizing Provider  apixaban  (ELIQUIS ) 2.5 MG TABS tablet Take 1 tablet (2.5 mg total) by mouth 2 (two) times daily. 08/10/23  Yes Turner, Wilbert SAUNDERS, MD  atorvastatin  (LIPITOR) 20 MG tablet Take 1 tablet (20 mg total) by mouth daily. 10/08/23 01/12/25 Yes Turner, Wilbert SAUNDERS, MD  Biotin  10 MG CAPS Take 10 mg by mouth daily.   Yes [provider]  calcium  carbonate (TUMS EX) 750 MG chewable tablet Chew 1 tablet by mouth daily as needed for heartburn.   Yes [provider]  Cholecalciferol  (VITAMIN D3) 125 MCG (5000 UT) CAPS Take 1 capsule by mouth daily.    Yes [provider]  diltiazem  (CARDIZEM  CD) 120 MG 24 hr capsule Take 1 capsule (120 mg total) by mouth daily. 07/15/23  Yes Turner, Wilbert SAUNDERS, MD  diltiazem  (CARDIZEM ) 30 MG tablet Take 1 tablet (30 mg total) by mouth daily as needed. For breakthrough atrial fibrillation. 10/13/22  Yes Turner, Wilbert SAUNDERS, MD  hydrochlorothiazide  (MICROZIDE ) 12.5 MG capsule Take 1 capsule (12.5 mg total) by mouth daily. 07/15/23  Yes Turner, Wilbert SAUNDERS, MD  levothyroxine  (SYNTHROID ) 100 MCG tablet Take 1 tablet (100 mcg total) by mouth daily. 07/13/23  Yes Shamleffer, Ibtehal Jaralla, MD  Magnesium  250 MG TABS Take 250 mg by mouth daily.   Yes [provider]  Melatonin 10 MG TABS Take 10 mg by mouth at bedtime.   Yes [provider]  Omega-3 Fatty Acids (OMEGA-3 FISH OIL PO) Take 1 capsule by mouth daily.   Yes [provider]  polyethylene glycol (MIRALAX  / GLYCOLAX ) packet Take 17 g by mouth daily.   Yes [provider]  traMADol  (ULTRAM ) 50 MG tablet Take 50 mg by mouth 4 (four) times daily as needed for moderate pain (pain score 4-6). 01/05/23  Yes [provider]  traZODone  (DESYREL ) 50 MG tablet Take 50 mg by mouth at bedtime.   Yes [provider]    Allergies: Niacin and related, Amlodipine , Cefdinir, Celecoxib, Codeine, Nebivolol hcl, Pregabalin, Ciprofloxacin, Lisinopril, Plaquenil  [hydroxychloroquine ], Sulfamethoxazole-trimethoprim, Augmentin [amoxicillin-pot clavulanate], Cephalexin, and Sulfonamide derivatives    Review of Systems  Updated Vital Signs BP (!) 146/85   Pulse 66   Temp 98.9 F (37.2 C)   Resp 19   Ht 5' 6 (1.676 m)   Wt 52.2 kg   SpO2 100%   BMI 18.56 kg/m   Physical Exam Vitals and nursing note reviewed.  Constitutional:      General: She is not in acute distress.    Appearance: She is well-developed. She is not diaphoretic.  HENT:     Head: Normocephalic and atraumatic.  Eyes:     Pupils: Pupils are equal, round, and reactive to light.  Cardiovascular:  Rate and Rhythm: Normal rate and regular rhythm.     Heart sounds: No murmur heard.    No friction rub. No gallop.  Pulmonary:     Effort: Pulmonary effort is normal.     Breath sounds: No wheezing or rales.  Abdominal:     General: There is no distension.     Palpations: Abdomen is soft.     Tenderness: There is no abdominal tenderness.  Musculoskeletal:        General: Tenderness present.     Cervical back: Normal range of motion and neck supple.     Comments: Pain mostly about the right hip.  No obvious pain with compression of the pelvis.  Pulse motor and sensation intact in the right lower extremity.  Difficult to range the knee as it causes pain in the hip.   Skin:    General: Skin is warm and dry.  Neurological:     Mental Status: She is alert and oriented to person, place, and time.  Psychiatric:        Behavior: Behavior normal.     (all labs ordered are listed, but only abnormal results are displayed) Labs Reviewed  COMPREHENSIVE METABOLIC PANEL WITH GFR - Abnormal; Notable for the following components:      Result Value   Potassium 3.1 (*)    Glucose, Bld 101 (*)    BUN 28 (*)    Creatinine, Ser 1.57 (*)    Total Protein 6.4 (*)    Total Bilirubin 1.3 (*)    GFR, Estimated 32 (*)    All other components within normal limits  CBC WITH DIFFERENTIAL/PLATELET  MAGNESIUM     EKG: EKG Interpretation Date/Time:  Wednesday January 13 2024 17:47:33 EDT Ventricular Rate:  65 PR Interval:  207 QRS Duration:  98 QT Interval:  417 QTC Calculation: 434 R Axis:   -55  Text Interpretation: Sinus or ectopic atrial rhythm Left anterior fascicular block Low voltage, extremity leads No significant change since last tracing Confirmed by Emil Share [phone]) on 01/13/2024 5:49:51 PM  Radiology: [facility] Knee Complete 4 Views Right Result Date: 01/13/2024 EXAM: 4 VIEW(S) XRAY OF THE KNEE 01/13/2024 06:46:00 PM COMPARISON: None available. CLINICAL HISTORY: Fall. EMS reports they arrive to patient's house due to mechanical fall from a 3 ft table that she was kneeling on about to stand to dust the fall. FINDINGS: BONES AND JOINTS: Total knee arthroplasty hardware in place. No acute fracture. No focal osseous lesion. No joint dislocation. No significant joint effusion. SOFT TISSUES: The soft tissues are unremarkable. IMPRESSION: 1. No acute abnormalities. Electronically signed by: Pinkie Pebbles MD 01/13/2024 07:10 PM EDT RP Workstation: [id number]   DG Chest 1 View Result Date: 01/13/2024 EXAM: 1 VIEW(S) XRAY OF THE CHEST 01/13/2024 06:46:00 PM COMPARISON: 06/15/2007 CLINICAL HISTORY: Fall. EMS reports they arrive to patient's house due to mechanical  fall from a 3 ft table that she was kneeling on about to stand to dust the fall. FINDINGS: LUNGS AND PLEURA: No focal pulmonary opacity. No pulmonary edema. No pleural effusion. No pneumothorax. HEART AND MEDIASTINUM: No acute abnormality of the cardiac and mediastinal silhouettes. BONES AND SOFT TISSUES: No acute osseous abnormality. IMPRESSION: 1. No acute abnormalities. Electronically signed by: Pinkie Pebbles MD 01/13/2024 07:05 PM EDT RP Workstation: [id number]   DG Hip Unilat W or Wo Pelvis 2-3 Views Right Result Date: 01/13/2024 EXAM: 3 VIEW(S) XRAY OF THE BILATERAL HIP 01/13/2024 06:46:00 PM COMPARISON: None available. CLINICAL HISTORY: fall. Table formatting from the  original note was not included.; Images from the original note were not included.; EMS reports they arrive to pts house due to mechanical fall from a 26ft table that she was kneeling on about to stand to dust the fall. FINDINGS: BONES AND JOINTS: Right hip: Impacted subcapital fracture. Left hip: Status post ORIF (Open Reduction Internal Fixation). Bilateral hips: Mild degenerative changes. SOFT TISSUES: The soft tissues are unremarkable. IMPRESSION: 1. Impacted subcapital right femoral neck fracture. Electronically signed by: Pinkie Pebbles MD 01/13/2024 07:03 PM EDT RP Workstation: [id number]     Procedures   Medications Ordered in the ED  potassium chloride  (KLOR-CON ) packet 60 mEq (60 mEq Oral Not Given 01/13/24 2213)  potassium chloride  10 mEq in 100 mL IVPB (10 mEq Intravenous New Bag/Given 01/13/24 2205)  morphine  (PF) 4 MG/ML injection 4 mg (4 mg Intravenous Given 01/13/24 1820)  ondansetron  (ZOFRAN ) injection 4 mg (4 mg Intravenous Given 01/13/24 1820)  morphine  (PF) 4 MG/ML injection 4 mg (4 mg Intravenous Given 01/13/24 1930)  ondansetron  (ZOFRAN ) injection 4 mg (4 mg Intravenous Given 01/13/24 1928)  HYDROmorphone  (DILAUDID ) injection 0.5 mg (0.5 mg Intravenous Given 01/13/24 2158)                                     Medical Decision Making Amount and/or Complexity of Data Reviewed Labs: ordered. Radiology: ordered. ECG/medicine tests: ordered.  Risk Prescription drug management. Decision regarding hospitalization.   89 yo F with a chief complaints of a fall.  Nonsyncopal by history complaining of right hip pain primarily.  Concern for fracture on initial exam.  Will obtain plain films.  Blood work.  EKG.  Plain film of the right hip independently interpreted by me with a femoral neck fracture.  Will discuss with Ortho.  Discussed with Dr. Josefina.  The patients results and plan were reviewed and discussed.   Any x-rays performed were independently reviewed by myself.   Differential diagnosis were considered with the presenting HPI.  Medications  potassium chloride  (KLOR-CON ) packet 60 mEq (60 mEq Oral Not Given 01/13/24 2213)  potassium chloride  10 mEq in 100 mL IVPB (10 mEq Intravenous New Bag/Given 01/13/24 2205)  morphine  (PF) 4 MG/ML injection 4 mg (4 mg Intravenous Given 01/13/24 1820)  ondansetron  (ZOFRAN ) injection 4 mg (4 mg Intravenous Given 01/13/24 1820)  morphine  (PF) 4 MG/ML injection 4 mg (4 mg Intravenous Given 01/13/24 1930)  ondansetron  (ZOFRAN ) injection 4 mg (4 mg Intravenous Given 01/13/24 1928)  HYDROmorphone  (DILAUDID ) injection 0.5 mg (0.5 mg Intravenous Given 01/13/24 2158)    Vitals:   01/13/24 2218 01/13/24 2219 01/13/24 2220 01/13/24 2221  BP:      Pulse: 66 66 65 66  Resp: (!) 25 (!) 26 (!) 26 19  Temp:      TempSrc:      SpO2: 100% 100% 100% 100%  Weight:      Height:        Final diagnoses:  Closed displaced fracture of right femoral neck (HCC)    Admission/ observation were discussed with the admitting physician, patient and/or family and they are comfortable with the plan.       Final diagnoses:  Closed displaced fracture of right femoral neck [facility])    ED Discharge Orders     None          Emil Share, DO 01/13/24 2248

## 2024-01-13 NOTE — ED Notes (Signed)
 Attempted to notify admitting provider x2 about pain medication. No response from admitting provider.

## 2024-01-13 NOTE — ED Notes (Signed)
 Per main lab, 3rd recollection of CMP has hemolyzed. MD made aware.

## 2024-01-13 NOTE — Consult Note (Cosign Needed Addendum)
 ORTHOPAEDIC CONSULTATION  REQUESTING PHYSICIAN: Emil Share, DO  Chief Complaint: fall  HPI: Samantha Henderson is a 89 y.o. female with history of osteoporosis, A-fib on Eliquis , hypertension, hyperlipidemia, lower extremity edema, temporal arteritis, hypothyroidism, aortic valve regurgitation, mitral valve regurgitation who presented to the [facility] emergency department today via EMS after fall.  She was standing on a low bench while trying to clean a ceiling fan.  She lost her balance and fell directly onto her right side.  She states pain is severe mainly located at the lateral right hip and right knee.  She was unable to bear weight.  Denies loss of consciousness, pain elsewhere, chest pain, shortness of breath, nausea or vomiting.  Denies paresthesias.  Patient has a history of a left femoral neck fracture s/p left hip pinning 04/12/2016, and right total knee replacement 05/14/23 both performed by Dr. Josefina.  Patient lives alone at baseline, does not use an assistive device.  Past Medical History:  Diagnosis Date   Abdominal bruit    Abdominal bruit in the past no aortic aneurysm by evaluation in the past   Agatston coronary artery calcium  score less than 100 09/2023   Coronary Ca score 63   Aortic atherosclerosis    Aortic regurgitation    Mild by echo 08/2023   Arm weakness    Arm weakness with exercise, January, 2012   Arthritis    Benign essential HTN 01/27/2017   Bradycardia    Carotid stenosis    1-39% bilateral by dopplers 09/2019   Constipation    Constipation with diltiazem  March, 2012   Diastolic dysfunction    Mild   Dizziness    HTN (hypertension)    Hypothyroidism    Leg cramps    from lipitor in the past   Mitral regurgitation 11/15/2018   mild echo 08/2023   PAC (premature atrial contraction)    Palpitation    Pericardial effusion    mild on echo 10/2022   PFO (patent foramen ovale)    Polymyalgia    Pulmonary hypertension (HCC)    Rheumatic fever     Questionable history of rheumatic fever, but no history of valvular abnormalities   Sinus tachycardia    Temporal arteritis (HCC)    Rule out temporal arteritis in the past   Tricuspid regurgitation    Moderate by echo 08/2023   Varicose veins of both lower extremities    Past Surgical History:  Procedure Laterality Date   BACK SURGERY     BUNIONECTOMY     HIP PINNING,CANNULATED Left 04/12/2016   Procedure: CANNULATED HIP PINNING;  Surgeon: Fonda Josefina, MD;  Location: [facility] OR;  Service: Orthopedics;  Laterality: Left;   NECK SURGERY     TONSILLECTOMY     TOTAL ABDOMINAL HYSTERECTOMY     TOTAL KNEE ARTHROPLASTY Right 05/13/2022   Procedure: TOTAL KNEE ARTHROPLASTY;  Surgeon: Josefina Fonda, MD;  Location: [facility] ORS;  Service: Orthopedics;  Laterality: Right;   Social History   Socioeconomic History   Marital status: Widowed    Spouse name: Not on file   Number of children: Not on file   Years of education: Not on file   Highest education level: Not on file  Occupational History   Not on file  Tobacco Use   Smoking status: Never   Smokeless tobacco: Never  Vaping Use   Vaping status: Never Used  Substance and Sexual Activity   Alcohol use: No   Drug use: No   Sexual  activity: Not Currently  Other Topics Concern   Not on file  Social History Narrative   Not on file   Social Drivers of Health   Financial Resource Strain: Not on file  Food Insecurity: No Food Insecurity (05/13/2022)   Hunger Vital Sign    Worried About Running Out of Food in the Last Year: Never true    Ran Out of Food in the Last Year: Never true  Transportation Needs: No Transportation Needs (05/13/2022)   PRAPARE - Administrator, Civil Service (Medical): No    Lack of Transportation (Non-Medical): No  Physical Activity: Not on file  Stress: Not on file  Social Connections: Unknown (09/03/2021)   Received from [facility]   Social Network    Social Network: Not on file   Family History   Problem Relation Age of Onset   Heart disease Mother    Stroke Mother    Allergies  Allergen Reactions   Niacin And Related Palpitations   Amlodipine  Swelling   Cefdinir Swelling    Patient tolerated Ancef  on 05/13/2022   Celecoxib Rash   Codeine Nausea And Vomiting   Nebivolol Hcl Swelling   Pregabalin Swelling   Ciprofloxacin Swelling    Hives  Tongue Sweeling   Lisinopril Cough   Plaquenil  [Hydroxychloroquine ] Other (See Comments)    Low platelets   Sulfamethoxazole-Trimethoprim     Rash   Augmentin [Amoxicillin-Pot Clavulanate] Rash   Cephalexin Rash    Patient tolerated Ancef  on 05/13/2022   Sulfonamide Derivatives Rash    Rash     Positive ROS: All other systems have been reviewed and were otherwise negative with the exception of those mentioned in the HPI and as above.  Physical Exam: General: Alert, no acute distress, Laying supine in bed. Cardiovascular: No pedal edema Respiratory: No cyanosis, no use of accessory musculature GI: No organomegaly, abdomen is soft and non-tender Skin: No lesions in the area of chief complaint Neurologic: Sensation intact distally Psychiatric: Patient is competent for consent with normal mood and affect Lymphatic: No axillary or cervical lymphadenopathy  MUSCULOSKELETAL: There is palpation to the right lateral hip.  Mild tenderness palpation over the patella not able to assess active range of motion at the knee due to severe pain at the right hip.  Nontender to right foot.  Dorsiflexion plantarflexion intact to the ankle.  Sensation intact diffusely at the foot foot.  2+ DP pulse.  X-rays of the right hip show a nondisplaced right femoral neck fracture, x-rays of the right knee show no bony abnormalities intact right total knee replacement  Assessment: Right femoral neck fracture  Plan: -Plan for admission to [facility] tonight, please keep n.p.o. after midnight in anticipation of surgery with Dr. Josefina tomorrow if  medically optimized -Please hold Eliquis , given minimal blood loss expected with hip pinning I do not think we need to wait longer for surgical treatment for Eliquis  washout - Risks, benefits and alternatives of a right hip cannulated pinning were discussed with the patient, she is familiar with the perioperative process regarding this procedure due to previous left hip pinning performed in 2017.  She is in agreement with plan. - Bed rest until surgery tomorrow - would recommend hydrocodone  rather than oxycodone  for oral pain control, patient has history of significant nausea with oxycodone  after total knee replacement    Army MARLA Daring, PA-C    01/13/2024 8:14 PM

## 2024-01-13 NOTE — H&P (View-Only) (Signed)
 ORTHOPAEDIC CONSULTATION  REQUESTING PHYSICIAN: Emil Share, DO  Chief Complaint: fall  HPI: Samantha Henderson is a 89 y.o. female with history of osteoporosis, A-fib on Eliquis , hypertension, hyperlipidemia, lower extremity edema, temporal arteritis, hypothyroidism, aortic valve regurgitation, mitral valve regurgitation who presented to the [facility] emergency department today via EMS after fall.  She was standing on a low bench while trying to clean a ceiling fan.  She lost her balance and fell directly onto her right side.  She states pain is severe mainly located at the lateral right hip and right knee.  She was unable to bear weight.  Denies loss of consciousness, pain elsewhere, chest pain, shortness of breath, nausea or vomiting.  Denies paresthesias.  Patient has a history of a left femoral neck fracture s/p left hip pinning 04/12/2016, and right total knee replacement 05/14/23 both performed by Dr. Josefina.  Patient lives alone at baseline, does not use an assistive device. Last dose of Eliquis  was this morning.   Past Medical History:  Diagnosis Date   Abdominal bruit    Abdominal bruit in the past no aortic aneurysm by evaluation in the past   Agatston coronary artery calcium  score less than 100 09/2023   Coronary Ca score 63   Aortic atherosclerosis    Aortic regurgitation    Mild by echo 08/2023   Arm weakness    Arm weakness with exercise, January, 2012   Arthritis    Benign essential HTN 01/27/2017   Bradycardia    Carotid stenosis    1-39% bilateral by dopplers 09/2019   Constipation    Constipation with diltiazem  March, 2012   Diastolic dysfunction    Mild   Dizziness    HTN (hypertension)    Hypothyroidism    Leg cramps    from lipitor in the past   Mitral regurgitation 11/15/2018   mild echo 08/2023   PAC (premature atrial contraction)    Palpitation    Pericardial effusion    mild on echo 10/2022   PFO (patent foramen ovale)    Polymyalgia    Pulmonary  hypertension (HCC)    Rheumatic fever    Questionable history of rheumatic fever, but no history of valvular abnormalities   Sinus tachycardia    Temporal arteritis (HCC)    Rule out temporal arteritis in the past   Tricuspid regurgitation    Moderate by echo 08/2023   Varicose veins of both lower extremities    Past Surgical History:  Procedure Laterality Date   BACK SURGERY     BUNIONECTOMY     HIP PINNING,CANNULATED Left 04/12/2016   Procedure: CANNULATED HIP PINNING;  Surgeon: Fonda Josefina, MD;  Location: [facility] OR;  Service: Orthopedics;  Laterality: Left;   NECK SURGERY     TONSILLECTOMY     TOTAL ABDOMINAL HYSTERECTOMY     TOTAL KNEE ARTHROPLASTY Right 05/13/2022   Procedure: TOTAL KNEE ARTHROPLASTY;  Surgeon: Josefina Fonda, MD;  Location: [facility] ORS;  Service: Orthopedics;  Laterality: Right;   Social History   Socioeconomic History   Marital status: Widowed    Spouse name: Not on file   Number of children: Not on file   Years of education: Not on file   Highest education level: Not on file  Occupational History   Not on file  Tobacco Use   Smoking status: Never   Smokeless tobacco: Never  Vaping Use   Vaping status: Never Used  Substance and Sexual Activity   Alcohol use: No  Drug use: No   Sexual activity: Not Currently  Other Topics Concern   Not on file  Social History Narrative   Not on file   Social Drivers of Health   Financial Resource Strain: Not on file  Food Insecurity: No Food Insecurity (05/13/2022)   Hunger Vital Sign    Worried About Running Out of Food in the Last Year: Never true    Ran Out of Food in the Last Year: Never true  Transportation Needs: No Transportation Needs (05/13/2022)   PRAPARE - Administrator, Civil Service (Medical): No    Lack of Transportation (Non-Medical): No  Physical Activity: Not on file  Stress: Not on file  Social Connections: Unknown (09/03/2021)   Received from [facility]   Social Network     Social Network: Not on file   Family History  Problem Relation Age of Onset   Heart disease Mother    Stroke Mother    Allergies  Allergen Reactions   Niacin And Related Palpitations   Amlodipine  Swelling   Cefdinir Swelling    Patient tolerated Ancef  on 05/13/2022   Celecoxib Rash   Codeine Nausea And Vomiting   Nebivolol Hcl Swelling   Pregabalin Swelling   Ciprofloxacin Swelling    Hives  Tongue Sweeling   Lisinopril Cough   Plaquenil  [Hydroxychloroquine ] Other (See Comments)    Low platelets   Sulfamethoxazole-Trimethoprim     Rash   Augmentin [Amoxicillin-Pot Clavulanate] Rash   Cephalexin Rash    Patient tolerated Ancef  on 05/13/2022   Sulfonamide Derivatives Rash    Rash     Positive ROS: All other systems have been reviewed and were otherwise negative with the exception of those mentioned in the HPI and as above.  Physical Exam: General: Alert, no acute distress, Laying supine in bed. Cardiovascular: No pedal edema Respiratory: No cyanosis, no use of accessory musculature GI: No organomegaly, abdomen is soft and non-tender Skin: No lesions in the area of chief complaint Neurologic: Sensation intact distally Psychiatric: Patient is competent for consent with normal mood and affect Lymphatic: No axillary or cervical lymphadenopathy  MUSCULOSKELETAL: There is palpation to the right lateral hip.  Mild tenderness palpation over the patella not able to assess active range of motion at the knee due to severe pain at the right hip.  Nontender to right foot.  Dorsiflexion plantarflexion intact to the ankle.  Sensation intact diffusely at the foot foot.  2+ DP pulse.  X-rays of the right hip show a nondisplaced right femoral neck fracture, x-rays of the right knee show no bony abnormalities intact right total knee replacement  Assessment: Right femoral neck fracture  Plan: -Plan for admission to [facility] tonight, please keep n.p.o. after midnight in  anticipation of surgery with Dr. Josefina tomorrow if medically optimized -Please hold Eliquis , given minimal blood loss expected with hip pinning I do not think we need to wait longer for surgical treatment for Eliquis  washout - Risks, benefits and alternatives of a right hip cannulated pinning were discussed with the patient, she is familiar with the perioperative process of this procedure due to previous left hip pinning performed in 2017.  She is in agreement with plan. - Bed rest until surgery tomorrow - would recommend hydrocodone  rather than oxycodone  for oral pain control, patient has history of significant nausea with oxycodone  after total knee replacement   Per patient request, I have left a message with her daughter [name] regarding patient's admission to hospital  and plan for surgery tomorrow.    Ladon Heney K Jonathyn Carothers, PA-C    01/13/2024 8:14 PM

## 2024-01-13 NOTE — ED Triage Notes (Addendum)
 EMS reports they arrive to pts house due to mechanical fall from a 21ft table that she was kneeling on about to stand to dust the fall. Pt wobbled and fell landing on her side. C/O Rt knee, hip and foot pain. Right knee has swelling slightly below knee. In rote reported that two of her toes were hurting more 2nd and 3rd toes on the right foot. Pt on Eliquis  for Afib. Pt reports did not hit head or LOC. Pt on the floor about 20 minutes because she had to drag herself to a phone and pull herself up.  18g to left forearm of Fentanyl  given in 25mcg increments Vitals BP 144/66 HR 70 O299% RA  R24

## 2024-01-14 ENCOUNTER — Encounter (HOSPITAL_COMMUNITY): Payer: Self-pay | Admitting: Internal Medicine

## 2024-01-14 ENCOUNTER — Encounter (HOSPITAL_COMMUNITY)
Admission: EM | Disposition: A | Discharge status: Skilled Nursing Facility | Payer: Self-pay | Source: Home / Self Care | Attending: Internal Medicine

## 2024-01-14 ENCOUNTER — Other Ambulatory Visit: Payer: Self-pay

## 2024-01-14 ENCOUNTER — Inpatient Hospital Stay (HOSPITAL_COMMUNITY)

## 2024-01-14 ENCOUNTER — Inpatient Hospital Stay (HOSPITAL_COMMUNITY): Admitting: Anesthesiology

## 2024-01-14 DIAGNOSIS — E039 Hypothyroidism, unspecified: Secondary | ICD-10-CM

## 2024-01-14 DIAGNOSIS — I4819 Other persistent atrial fibrillation: Secondary | ICD-10-CM | POA: Diagnosis not present

## 2024-01-14 DIAGNOSIS — I10 Essential (primary) hypertension: Secondary | ICD-10-CM | POA: Diagnosis not present

## 2024-01-14 DIAGNOSIS — E876 Hypokalemia: Secondary | ICD-10-CM | POA: Diagnosis not present

## 2024-01-14 DIAGNOSIS — S72001A Fracture of unspecified part of neck of right femur, initial encounter for closed fracture: Principal | ICD-10-CM | POA: Insufficient documentation

## 2024-01-14 DIAGNOSIS — I080 Rheumatic disorders of both mitral and aortic valves: Secondary | ICD-10-CM

## 2024-01-14 DIAGNOSIS — N179 Acute kidney failure, unspecified: Secondary | ICD-10-CM

## 2024-01-14 HISTORY — PX: HIP PINNING,CANNULATED: SHX1758

## 2024-01-14 LAB — CBC
HCT: 39.6 % (ref 36.0–46.0)
Hemoglobin: 12.9 g/dL (ref 12.0–15.0)
MCH: 29.4 pg (ref 26.0–34.0)
MCHC: 32.6 g/dL (ref 30.0–36.0)
MCV: 90.2 fL (ref 80.0–100.0)
Platelets: 173 K/uL (ref 150–400)
RBC: 4.39 MIL/uL (ref 3.87–5.11)
RDW: 12.5 % (ref 11.5–15.5)
WBC: 10.7 K/uL — ABNORMAL HIGH (ref 4.0–10.5)
nRBC: 0 % (ref 0.0–0.2)

## 2024-01-14 LAB — BASIC METABOLIC PANEL WITH GFR
Anion gap: 8 (ref 5–15)
BUN: 24 mg/dL — ABNORMAL HIGH (ref 8–23)
CO2: 25 mmol/L (ref 22–32)
Calcium: 9 mg/dL (ref 8.9–10.3)
Chloride: 103 mmol/L (ref 98–111)
Creatinine, Ser: 1.46 mg/dL — ABNORMAL HIGH (ref 0.44–1.00)
GFR, Estimated: 35 mL/min — ABNORMAL LOW (ref >60)
Glucose, Bld: 110 mg/dL — ABNORMAL HIGH (ref 70–99)
Potassium: 4.2 mmol/L (ref 3.5–5.1)
Sodium: 136 mmol/L (ref 135–145)

## 2024-01-14 LAB — TSH: TSH: 3.316 u[IU]/mL (ref 0.350–4.500)

## 2024-01-14 LAB — SURGICAL PCR SCREEN
MRSA, PCR: NEGATIVE
Staphylococcus aureus: NEGATIVE

## 2024-01-14 SURGERY — FIXATION, FEMUR, NECK, PERCUTANEOUS, USING SCREW
Anesthesia: General | Site: Hip | Laterality: Right

## 2024-01-14 MED ORDER — CEFAZOLIN SODIUM-DEXTROSE 2-4 GM/100ML-% IV SOLN
2.0000 g | Freq: Four times a day (QID) | INTRAVENOUS | Status: AC
  Administered 2024-01-14 – 2024-01-15 (×2): 2 g via INTRAVENOUS
  Filled 2024-01-14 (×2): qty 100

## 2024-01-14 MED ORDER — CHLORHEXIDINE GLUCONATE 4 % EX SOLN
60.0000 mL | Freq: Once | CUTANEOUS | Status: AC
Start: 2024-01-14 — End: 2024-01-14
  Administered 2024-01-14: 4 via TOPICAL
  Filled 2024-01-14 (×2): qty 60

## 2024-01-14 MED ORDER — ATORVASTATIN CALCIUM 10 MG PO TABS
20.0000 mg | ORAL_TABLET | Freq: Every day | ORAL | Status: DC
  Administered 2024-01-14 – 2024-01-17 (×4): 20 mg via ORAL
  Filled 2024-01-14 (×4): qty 2

## 2024-01-14 MED ORDER — PROPOFOL 10 MG/ML IV BOLUS
INTRAVENOUS | Status: AC
  Filled 2024-01-14: qty 20

## 2024-01-14 MED ORDER — CHLORHEXIDINE GLUCONATE 0.12 % MT SOLN: 15.0000 mL | Freq: Once | OROMUCOSAL | Status: AC

## 2024-01-14 MED ORDER — HYDROCODONE-ACETAMINOPHEN 7.5-325 MG PO TABS
1.0000 | ORAL_TABLET | ORAL | Status: DC | PRN
  Administered 2024-01-14: 1 via ORAL
  Administered 2024-01-17: 2 via ORAL
  Filled 2024-01-14: qty 1
  Filled 2024-01-14: qty 2

## 2024-01-14 MED ORDER — TRANEXAMIC ACID-NACL 1000-0.7 MG/100ML-% IV SOLN
1000.0000 mg | INTRAVENOUS | Status: AC
  Administered 2024-01-14: 1000 mg via INTRAVENOUS
  Filled 2024-01-14: qty 100

## 2024-01-14 MED ORDER — ONDANSETRON HCL 4 MG/2ML IJ SOLN: 4.0000 mg | Freq: Once | INTRAMUSCULAR | Status: DC | PRN

## 2024-01-14 MED ORDER — METHOCARBAMOL 1000 MG/10ML IJ SOLN: 500.0000 mg | Freq: Four times a day (QID) | INTRAMUSCULAR | Status: DC | PRN

## 2024-01-14 MED ORDER — CEFAZOLIN SODIUM-DEXTROSE 2-4 GM/100ML-% IV SOLN
2.0000 g | INTRAVENOUS | Status: AC
  Administered 2024-01-14: 2 g via INTRAVENOUS
  Filled 2024-01-14: qty 100

## 2024-01-14 MED ORDER — FERROUS SULFATE 325 (65 FE) MG PO TABS
325.0000 mg | ORAL_TABLET | Freq: Three times a day (TID) | ORAL | Status: DC
  Administered 2024-01-14 – 2024-01-16 (×5): 325 mg via ORAL
  Filled 2024-01-14 (×7): qty 1

## 2024-01-14 MED ORDER — METHOCARBAMOL 1000 MG/10ML IJ SOLN: 500.0000 mg | Freq: Three times a day (TID) | INTRAMUSCULAR | Status: DC | PRN

## 2024-01-14 MED ORDER — ACETAMINOPHEN 325 MG PO TABS: 325.0000 mg | ORAL_TABLET | Freq: Four times a day (QID) | ORAL | Status: DC | PRN

## 2024-01-14 MED ORDER — NALOXONE HCL 0.4 MG/ML IJ SOLN: 0.4000 mg | INTRAMUSCULAR | Status: DC | PRN

## 2024-01-14 MED ORDER — EPHEDRINE SULFATE-NACL 50-0.9 MG/10ML-% IV SOSY
PREFILLED_SYRINGE | INTRAVENOUS | Status: DC | PRN
  Administered 2024-01-14: 10 mg via INTRAVENOUS

## 2024-01-14 MED ORDER — MENTHOL 3 MG MT LOZG: 1.0000 | LOZENGE | OROMUCOSAL | Status: DC | PRN

## 2024-01-14 MED ORDER — ONDANSETRON HCL 4 MG/2ML IJ SOLN: 4.0000 mg | Freq: Four times a day (QID) | INTRAMUSCULAR | Status: DC | PRN

## 2024-01-14 MED ORDER — MAGNESIUM CITRATE PO SOLN: 1.0000 | Freq: Once | ORAL | Status: DC | PRN

## 2024-01-14 MED ORDER — TRAZODONE HCL 50 MG PO TABS
50.0000 mg | ORAL_TABLET | Freq: Every day | ORAL | Status: DC
Start: 2024-01-14 — End: 2024-01-17
  Administered 2024-01-14 – 2024-01-16 (×3): 50 mg via ORAL
  Filled 2024-01-14 (×3): qty 1

## 2024-01-14 MED ORDER — ONDANSETRON HCL 4 MG/2ML IJ SOLN
INTRAMUSCULAR | Status: AC
  Filled 2024-01-14: qty 2

## 2024-01-14 MED ORDER — PROPOFOL 10 MG/ML IV BOLUS
INTRAVENOUS | Status: DC | PRN
  Administered 2024-01-14: 100 mg via INTRAVENOUS

## 2024-01-14 MED ORDER — DEXAMETHASONE SODIUM PHOSPHATE 10 MG/ML IJ SOLN
INTRAMUSCULAR | Status: AC
  Filled 2024-01-14: qty 1

## 2024-01-14 MED ORDER — FENTANYL CITRATE (PF) 100 MCG/2ML IJ SOLN: 25.0000 ug | INTRAMUSCULAR | Status: DC | PRN

## 2024-01-14 MED ORDER — POTASSIUM CHLORIDE 10 MEQ/100ML IV SOLN
10.0000 meq | INTRAVENOUS | Status: AC
  Administered 2024-01-14 (×3): 10 meq via INTRAVENOUS
  Filled 2024-01-14 (×3): qty 100

## 2024-01-14 MED ORDER — DILTIAZEM HCL ER COATED BEADS 120 MG PO CP24
120.0000 mg | ORAL_CAPSULE | Freq: Every day | ORAL | Status: DC
  Administered 2024-01-14 – 2024-01-15 (×2): 120 mg via ORAL
  Filled 2024-01-14 (×3): qty 1

## 2024-01-14 MED ORDER — BISACODYL 10 MG RE SUPP: 10.0000 mg | Freq: Every day | RECTAL | Status: DC | PRN

## 2024-01-14 MED ORDER — LIDOCAINE 2% (20 MG/ML) 5 ML SYRINGE
INTRAMUSCULAR | Status: AC
  Filled 2024-01-14: qty 5

## 2024-01-14 MED ORDER — ONDANSETRON HCL 4 MG PO TABS: 4.0000 mg | ORAL_TABLET | Freq: Four times a day (QID) | ORAL | Status: DC | PRN

## 2024-01-14 MED ORDER — LACTATED RINGERS IV SOLN
INTRAVENOUS | Status: DC
Start: 2024-01-14 — End: 2024-01-14

## 2024-01-14 MED ORDER — LEVOTHYROXINE SODIUM 100 MCG PO TABS
100.0000 ug | ORAL_TABLET | Freq: Every day | ORAL | Status: DC
  Administered 2024-01-15 – 2024-01-17 (×3): 100 ug via ORAL
  Filled 2024-01-14 (×3): qty 1

## 2024-01-14 MED ORDER — BUPIVACAINE HCL (PF) 0.25 % IJ SOLN
INTRAMUSCULAR | Status: AC
  Filled 2024-01-14: qty 30

## 2024-01-14 MED ORDER — ORAL CARE MOUTH RINSE: 15.0000 mL | Freq: Once | OROMUCOSAL | Status: AC

## 2024-01-14 MED ORDER — SENNA 8.6 MG PO TABS
1.0000 | ORAL_TABLET | Freq: Two times a day (BID) | ORAL | Status: DC
  Administered 2024-01-14 – 2024-01-17 (×5): 8.6 mg via ORAL
  Filled 2024-01-14 (×6): qty 1

## 2024-01-14 MED ORDER — HYDROMORPHONE HCL 1 MG/ML IJ SOLN
0.5000 mg | INTRAMUSCULAR | Status: DC | PRN
  Administered 2024-01-14 (×2): 0.5 mg via INTRAVENOUS
  Filled 2024-01-14: qty 0.5
  Filled 2024-01-14: qty 1

## 2024-01-14 MED ORDER — HYDROCODONE-ACETAMINOPHEN 5-325 MG PO TABS: 1.0000 | ORAL_TABLET | Freq: Four times a day (QID) | ORAL | Status: DC | PRN

## 2024-01-14 MED ORDER — FENTANYL CITRATE (PF) 250 MCG/5ML IJ SOLN
INTRAMUSCULAR | Status: AC
  Filled 2024-01-14: qty 5

## 2024-01-14 MED ORDER — HYDROCODONE-ACETAMINOPHEN 5-325 MG PO TABS
1.0000 | ORAL_TABLET | ORAL | Status: DC | PRN
  Administered 2024-01-15 – 2024-01-16 (×6): 2 via ORAL
  Administered 2024-01-16: 1 via ORAL
  Administered 2024-01-17: 2 via ORAL
  Filled 2024-01-14: qty 1
  Filled 2024-01-14 (×7): qty 2

## 2024-01-14 MED ORDER — DEXAMETHASONE SODIUM PHOSPHATE 10 MG/ML IJ SOLN
INTRAMUSCULAR | Status: DC | PRN
  Administered 2024-01-14: 5 mg via INTRAVENOUS

## 2024-01-14 MED ORDER — LIDOCAINE 2% (20 MG/ML) 5 ML SYRINGE
INTRAMUSCULAR | Status: DC | PRN
  Administered 2024-01-14: 40 mg via INTRAVENOUS

## 2024-01-14 MED ORDER — PHENYLEPHRINE 80 MCG/ML (10ML) SYRINGE FOR IV PUSH (FOR BLOOD PRESSURE SUPPORT)
PREFILLED_SYRINGE | INTRAVENOUS | Status: DC | PRN
  Administered 2024-01-14: 80 ug via INTRAVENOUS

## 2024-01-14 MED ORDER — PHENYLEPHRINE HCL-NACL 20-0.9 MG/250ML-% IV SOLN
INTRAVENOUS | Status: DC | PRN
  Administered 2024-01-14: 40 ug/min via INTRAVENOUS

## 2024-01-14 MED ORDER — SODIUM CHLORIDE 0.9 % IV SOLN: INTRAVENOUS | Status: AC

## 2024-01-14 MED ORDER — METHOCARBAMOL 500 MG PO TABS
500.0000 mg | ORAL_TABLET | Freq: Four times a day (QID) | ORAL | Status: DC | PRN
  Administered 2024-01-14 – 2024-01-16 (×4): 500 mg via ORAL
  Filled 2024-01-14 (×4): qty 1

## 2024-01-14 MED ORDER — ALUM & MAG HYDROXIDE-SIMETH 200-200-20 MG/5ML PO SUSP: 30.0000 mL | ORAL | Status: DC | PRN

## 2024-01-14 MED ORDER — GLYCOPYRROLATE PF 0.2 MG/ML IJ SOSY
PREFILLED_SYRINGE | INTRAMUSCULAR | Status: DC | PRN
  Administered 2024-01-14: .1 mg via INTRAVENOUS

## 2024-01-14 MED ORDER — MELATONIN 5 MG PO TABS
10.0000 mg | ORAL_TABLET | Freq: Every day | ORAL | Status: DC
  Administered 2024-01-14 – 2024-01-16 (×3): 10 mg via ORAL
  Filled 2024-01-14 (×3): qty 2

## 2024-01-14 MED ORDER — FENTANYL CITRATE (PF) 250 MCG/5ML IJ SOLN
INTRAMUSCULAR | Status: DC | PRN
  Administered 2024-01-14: 50 ug via INTRAVENOUS

## 2024-01-14 MED ORDER — CHLORHEXIDINE GLUCONATE 0.12 % MT SOLN
OROMUCOSAL | Status: AC
  Administered 2024-01-14: 15 mL via OROMUCOSAL
  Filled 2024-01-14: qty 15

## 2024-01-14 MED ORDER — PHENOL 1.4 % MT LIQD: 1.0000 | OROMUCOSAL | Status: DC | PRN

## 2024-01-14 MED ORDER — POLYETHYLENE GLYCOL 3350 17 G PO PACK
17.0000 g | PACK | Freq: Every day | ORAL | Status: DC
  Administered 2024-01-15 – 2024-01-17 (×2): 17 g via ORAL
  Filled 2024-01-14 (×4): qty 1

## 2024-01-14 MED ORDER — ACETAMINOPHEN 500 MG PO TABS
500.0000 mg | ORAL_TABLET | Freq: Four times a day (QID) | ORAL | Status: AC
Start: 2024-01-14 — End: 2024-01-15
  Administered 2024-01-14 – 2024-01-15 (×4): 500 mg via ORAL
  Filled 2024-01-14 (×4): qty 1

## 2024-01-14 MED ORDER — BUPIVACAINE HCL 0.25 % IJ SOLN
INTRAMUSCULAR | Status: DC | PRN
  Administered 2024-01-14: 10 mL

## 2024-01-14 MED ORDER — DOCUSATE SODIUM 100 MG PO CAPS
100.0000 mg | ORAL_CAPSULE | Freq: Two times a day (BID) | ORAL | Status: DC
  Administered 2024-01-14 – 2024-01-17 (×5): 100 mg via ORAL
  Filled 2024-01-14 (×6): qty 1

## 2024-01-14 MED ORDER — SODIUM CHLORIDE 0.9 % IV SOLN: INTRAVENOUS | Status: DC

## 2024-01-14 MED ORDER — TRANEXAMIC ACID-NACL 1000-0.7 MG/100ML-% IV SOLN
1000.0000 mg | Freq: Once | INTRAVENOUS | Status: AC
Start: 2024-01-14 — End: 2024-01-14
  Administered 2024-01-14: 1000 mg via INTRAVENOUS
  Filled 2024-01-14: qty 100

## 2024-01-14 MED ORDER — MORPHINE SULFATE (PF) 2 MG/ML IV SOLN
0.5000 mg | INTRAVENOUS | Status: DC | PRN
  Filled 2024-01-14: qty 1

## 2024-01-14 MED ORDER — POLYETHYLENE GLYCOL 3350 17 G PO PACK
17.0000 g | PACK | Freq: Every day | ORAL | Status: DC | PRN
  Administered 2024-01-16: 17 g via ORAL

## 2024-01-14 MED ORDER — ACETAMINOPHEN 500 MG PO TABS
1000.0000 mg | ORAL_TABLET | Freq: Once | ORAL | Status: AC
  Administered 2024-01-14: 1000 mg via ORAL
  Filled 2024-01-14: qty 2

## 2024-01-14 MED ORDER — POVIDONE-IODINE 10 % EX SWAB
2.0000 | Freq: Once | CUTANEOUS | Status: AC
Start: 2024-01-14 — End: 2024-01-14
  Administered 2024-01-14: 2 via TOPICAL

## 2024-01-14 MED ORDER — PHENYLEPHRINE 80 MCG/ML (10ML) SYRINGE FOR IV PUSH (FOR BLOOD PRESSURE SUPPORT)
PREFILLED_SYRINGE | INTRAVENOUS | Status: AC
  Filled 2024-01-14: qty 10

## 2024-01-14 MED ORDER — ONDANSETRON HCL 4 MG/2ML IJ SOLN
INTRAMUSCULAR | Status: DC | PRN
  Administered 2024-01-14: 4 mg via INTRAVENOUS

## 2024-01-14 MED ORDER — 0.9 % SODIUM CHLORIDE (POUR BTL) OPTIME
TOPICAL | Status: DC | PRN
  Administered 2024-01-14: 1000 mL

## 2024-01-14 SURGICAL SUPPLY — 38 items
BAG COUNTER SPONGE SURGICOUNT (BAG) ×1 IMPLANT
BENZOIN TINCTURE PRP APPL 2/3 (GAUZE/BANDAGES/DRESSINGS) IMPLANT
BIT DRILL CANNULATED 5.0 (BIT) IMPLANT
BNDG COHESIVE 6X5 TAN ST LF (GAUZE/BANDAGES/DRESSINGS) IMPLANT
BOOTCOVER CLEANROOM LRG (PROTECTIVE WEAR) ×2 IMPLANT
CLSR STERI-STRIP ANTIMIC 1/2X4 (GAUZE/BANDAGES/DRESSINGS) IMPLANT
COVER PERINEAL POST (MISCELLANEOUS) ×1 IMPLANT
COVER SURGICAL LIGHT HANDLE (MISCELLANEOUS) ×1 IMPLANT
DRAPE STERI IOBAN 125X83 (DRAPES) ×1 IMPLANT
DRESSING MEPILEX FLEX 4X4 (GAUZE/BANDAGES/DRESSINGS) ×1 IMPLANT
DURAPREP 26ML APPLICATOR (WOUND CARE) ×1 IMPLANT
ELECTRODE REM PT RTRN 9FT ADLT (ELECTROSURGICAL) ×1 IMPLANT
GAUZE PAD ABD 8X10 STRL (GAUZE/BANDAGES/DRESSINGS) IMPLANT
GLOVE BIO SURGEON STRL SZ7 (GLOVE) ×2 IMPLANT
GLOVE BIOGEL PI IND STRL 7.0 (GLOVE) ×1 IMPLANT
GLOVE ORTHO TXT STRL SZ7.5 (GLOVE) ×1 IMPLANT
GOWN STRL REUS W/ TWL LRG LVL3 (GOWN DISPOSABLE) IMPLANT
GOWN STRL REUS W/ TWL XL LVL3 (GOWN DISPOSABLE) ×1 IMPLANT
KIT BASIN OR (CUSTOM PROCEDURE TRAY) ×1 IMPLANT
KIT TURNOVER KIT B (KITS) ×1 IMPLANT
MANIFOLD NEPTUNE II (INSTRUMENTS) ×1 IMPLANT
NDL 22X1.5 STRL (OR ONLY) (MISCELLANEOUS) ×1 IMPLANT
NEEDLE 22X1.5 STRL (OR ONLY) (MISCELLANEOUS) ×1 IMPLANT
PACK GENERAL/GYN (CUSTOM PROCEDURE TRAY) ×1 IMPLANT
PAD ARMBOARD POSITIONER FOAM (MISCELLANEOUS) ×2 IMPLANT
PIN THD TIP GUIDE 3.2X12 (PIN) IMPLANT
SCREW CANN 6.5X90 (Screw) IMPLANT
SCREW CANN RVRS CUT FLT 85X16 (Screw) IMPLANT
SOLN 0.9% NACL 1000 ML (IV SOLUTION) ×1 IMPLANT
SOLN 0.9% NACL POUR BTL 1000ML (IV SOLUTION) ×1 IMPLANT
SOLN STERILE WATER 1000 ML (IV SOLUTION) ×1 IMPLANT
SOLN STERILE WATER BTL 1000 ML (IV SOLUTION) ×1 IMPLANT
SUT VIC AB 2-0 FS1 27 (SUTURE) ×1 IMPLANT
SUT VIC AB 2-0 SH 27XBRD (SUTURE) IMPLANT
SUT VIC AB 3-0 SH 27X BRD (SUTURE) IMPLANT
SYR CONTROL 10ML LL (SYRINGE) ×1 IMPLANT
TOWEL GREEN STERILE (TOWEL DISPOSABLE) ×1 IMPLANT
TOWEL GREEN STERILE FF (TOWEL DISPOSABLE) ×1 IMPLANT

## 2024-01-14 NOTE — Transfer of Care (Signed)
 Immediate Anesthesia Transfer of Care Note  Patient: Samantha Henderson  Procedure(s) Performed: FIXATION, FEMUR, NECK, PERCUTANEOUS, USING SCREW (Right: Hip)  Patient Location: PACU  Anesthesia Type:General  Level of Consciousness: sedated  Airway & Oxygen Therapy: Patient Spontanous Breathing  Post-op Assessment: Report given to RN and Post -op Vital signs reviewed and stable  Post vital signs: Reviewed and stable  Last Vitals:  Vitals Value Taken Time  BP 144/68 01/14/24 16:55  Temp 36.7 C 01/14/24 16:55  Pulse 78 01/14/24 16:58  Resp 11 01/14/24 16:58  SpO2 94 % 01/14/24 16:58  Vitals shown include unfiled device data.  Last Pain:  Vitals:   01/14/24 1655  TempSrc:   PainSc: Asleep         Complications: No notable events documented.

## 2024-01-14 NOTE — Interval H&P Note (Signed)
 History and Physical Interval Note:  01/14/2024 3:21 PM  Samantha Henderson  has presented today for surgery, with the diagnosis of right hip fracture.  The various methods of treatment have been discussed with the patient and family. After consideration of risks, benefits and other options for treatment, the patient has consented to  Procedure(s) with comments: FIXATION, FEMUR, NECK, PERCUTANEOUS, USING SCREW (Right) - right hip percutaneous pinning as a surgical intervention.  The patient's history has been reviewed, patient examined, no change in status, stable for surgery.  I have reviewed the patient's chart and labs.  Questions were answered to the patient's satisfaction.     [name]

## 2024-01-14 NOTE — Progress Notes (Signed)
 Progress Note   Patient: Samantha Henderson FMW:[id number] DOB: [birth year] DOA: 01/13/2024     1 DOS: the patient was seen and examined on 01/14/2024   Brief hospital course: Samantha Henderson is a 89 y.o. female with medical history significant of paroxysmal A-fib on Eliquis , HFpEF, PFO, hypertension, hyperlipidemia, hypothyroidism, left femoral neck fracture status post left hip pinning in December 2017, right total knee replacement in January 2025 polymyalgia rheumatica, temporal arteritis presenting with complaints of pain in her right hip and knee after a mechanical fall.   She was noted to have impacted subcapital right femoral neck fracture, admitted to [facility] service with orthopedic consultation.  Assessment and Plan: Right femoral neck fracture Secondary to a mechanical fall.   Orthopedics consult appreciated. Plan to take her to the OR today. Continue to hold Eliquis .   Continue pain management: IV Dilaudid  PRN, Robaxin  PRN, oral hydrocodone  PRN.  PT OT post surgery as per orthopedics. Bedrest until orthopedic surgery.   Acute on CKD stage III A- Kidney function improved with fluids. Avoid nephrotoxic drugs. Hold hydrochlorothiazide . Monitor daily renal function.   Mild hypokalemia Improved with supplementation.   Paroxysmal A-fib Continue telemetry.  Hold Eliquis  as above.  Continue diltiazem .   Chronic HFpEF No signs of volume overload.   Resumed Cardizem .   Hypertension Continue diltiazem .  Hold hydrochlorothiazide  due to AKI.   Monitor blood pressure closely.   Hyperlipidemia Continue atorvastatin .   Hypothyroidism Continue levothyroxine .   Chronic constipation Continue home MiraLAX .   Mood disorder Continue trazodone .     Out of bed to chair. Incentive spirometry. Nursing supportive care. Fall, aspiration precautions. Diet:  Diet Orders (From admission, onward)     Start     Ordered   01/14/24 0808  Diet NPO time specified  Diet effective ____         01/14/24 9192           DVT prophylaxis: Sequential compression device to OR Start: 01/14/24 0808 SCDs Start: 01/14/24 0023  Level of care: Telemetry Medical   Code Status: Do not attempt resuscitation (DNR) PRE-ARREST INTERVENTIONS DESIRED  Subjective: Patient is seen and examined today morning.  She is lying comfortably.  Able to answer me appropriately.  Denies any complaints of pain.  Physical Exam: Vitals:   01/14/24 0730 01/14/24 0807 01/14/24 0926 01/14/24 1411  BP: (!) 129/57 (!) 145/62 (!) 145/62 (!) 123/56  Pulse: 61 95  (!) 59  Resp: 13 20  18   Temp:  98.6 F (37 C)  99 F (37.2 C)  TempSrc:  Oral  Oral  SpO2: 100% 93%  98%  Weight:    52.2 kg  Height:    5' 6 (1.676 m)    General - Elderly Caucasian female, no apparent distress HEENT - PERRLA, EOMI, atraumatic head, non tender sinuses. Lung - Clear, no rales, rhonchi, wheezes. Heart - S1, S2 heard, no murmurs, rubs, no pedal edema. Abdomen - Soft, non tender, bowel sounds good Neuro - Alert, awake and oriented, non focal exam. Skin - Warm and dry.  Data Reviewed:      Latest Ref Rng & Units 01/14/2024    4:30 AM 01/13/2024    5:50 PM 05/23/2022    1:09 PM  CBC  WBC 4.0 - 10.5 K/uL 10.7  8.5  6.2   Hemoglobin 12.0 - 15.0 g/dL 87.0  86.5  86.7   Hematocrit 36.0 - 46.0 % 39.6  39.5  41.4   Platelets 150 - 400 K/uL  173  PLATELET CLUMPS NOTED ON SMEAR, UNABLE TO ESTIMATE  263       Latest Ref Rng & Units 01/14/2024    4:30 AM 01/13/2024    8:10 PM 12/01/2022    8:06 AM  BMP  Glucose 70 - 99 mg/dL 889  898  897   BUN 8 - 23 mg/dL 24  28  16    Creatinine 0.44 - 1.00 mg/dL 8.53  8.42  8.72   BUN/Creat Ratio 12 - 28   13   Sodium 135 - 145 mmol/L 136  139  137   Potassium 3.5 - 5.1 mmol/L 4.2  3.1  4.1   Chloride 98 - 111 mmol/L 103  103  98   CO2 22 - 32 mmol/L 25  25  27    Calcium  8.9 - 10.3 mg/dL 9.0  89.9  89.9    DG Knee Complete 4 Views Right Result Date: 01/13/2024 EXAM: 4 VIEW(S) XRAY OF THE  KNEE 01/13/2024 06:46:00 PM COMPARISON: None available. CLINICAL HISTORY: Fall. EMS reports they arrive to patient's house due to mechanical fall from a 3 ft table that she was kneeling on about to stand to dust the fall. FINDINGS: BONES AND JOINTS: Total knee arthroplasty hardware in place. No acute fracture. No focal osseous lesion. No joint dislocation. No significant joint effusion. SOFT TISSUES: The soft tissues are unremarkable. IMPRESSION: 1. No acute abnormalities. Electronically signed by: Pinkie Pebbles MD 01/13/2024 07:10 PM EDT RP Workstation: [id number]   DG Chest 1 View Result Date: 01/13/2024 EXAM: 1 VIEW(S) XRAY OF THE CHEST 01/13/2024 06:46:00 PM COMPARISON: 06/15/2007 CLINICAL HISTORY: Fall. EMS reports they arrive to patient's house due to mechanical fall from a 3 ft table that she was kneeling on about to stand to dust the fall. FINDINGS: LUNGS AND PLEURA: No focal pulmonary opacity. No pulmonary edema. No pleural effusion. No pneumothorax. HEART AND MEDIASTINUM: No acute abnormality of the cardiac and mediastinal silhouettes. BONES AND SOFT TISSUES: No acute osseous abnormality. IMPRESSION: 1. No acute abnormalities. Electronically signed by: Pinkie Pebbles MD 01/13/2024 07:05 PM EDT RP Workstation: [id number]   DG Hip Unilat W or Wo Pelvis 2-3 Views Right Result Date: 01/13/2024 EXAM: 3 VIEW(S) XRAY OF THE BILATERAL HIP 01/13/2024 06:46:00 PM COMPARISON: None available. CLINICAL HISTORY: fall. Table formatting from the original note was not included.; Images from the original note were not included.; EMS reports they arrive to pts house due to mechanical fall from a 44ft table that she was kneeling on about to stand to dust the fall. FINDINGS: BONES AND JOINTS: Right hip: Impacted subcapital fracture. Left hip: Status post ORIF (Open Reduction Internal Fixation). Bilateral hips: Mild degenerative changes. SOFT TISSUES: The soft tissues are unremarkable. IMPRESSION: 1. Impacted  subcapital right femoral neck fracture. Electronically signed by: Pinkie Pebbles MD 01/13/2024 07:03 PM EDT RP Workstation: [id number]    Family Communication: Discussed with patient, she understand and agree. All questions answered.  Disposition: Status is: Inpatient Remains inpatient appropriate because: orthopedic intervention.  Planned Discharge Destination: Home with Home Health     Time spent: 44 minutes  Author: Concepcion Riser, MD 01/14/2024 3:32 PM Secure chat 7am to 7pm For on call review [email or web address].

## 2024-01-14 NOTE — Op Note (Signed)
 01/13/2024 - 01/14/2024  4:39 PM  PATIENT:  Samantha Henderson    PRE-OPERATIVE DIAGNOSIS:  right hip fracture, valgus impacted femoral neck  POST-OPERATIVE DIAGNOSIS:  Same  PROCEDURE: Right hip percutaneous pinning  SURGEON:  [name], MD  PHYSICIAN ASSISTANT: Army Daring, PA-C,  present and scrubbed throughout the case, critical for completion in a timely fashion, and for retraction, instrumentation, and closure.  ANESTHESIA:   General  ESTIMATED BLOOD LOSS: Minimal.  PREOPERATIVE INDICATIONS:  Samantha Henderson is a  89 y.o. female who fell and was found to have a diagnosis of right hip fracture who elected for surgical management.  She had a similar injury on the other side that I pinned before.  The risks benefits and alternatives were discussed with the patient preoperatively including but not limited to the risks of infection, bleeding, nerve injury, cardiopulmonary complications, blood clots, malunion, nonunion, avascular necrosis, the need for revision surgery, the potential for conversion to hemiarthroplasty, among others, and the patient was willing to proceed.  OPERATIVE IMPLANTS:   Stainless steel 85 x 16mm thread zimmer screws x2, with one 90 x 32mm screw  OPERATIVE FINDINGS: Clinical osteoporosis with weak bone, proximal femur, the inferior screw had very poor bone quality.    OPERATIVE PROCEDURE: The patient was brought to the operating room and placed in supine position. IV antibiotics were given. General anesthesia administered.  The patient was placed on the fracture table. The operative extremity was positioned, without any significant reduction maneuver and was prepped and draped in usual sterile fashion.  Time out was performed.  Small incision was made distal to the greater trochanter, and 3 guidewires were introduced Into an inverted triangle configuration. The lengths were measured. The reduction was slightly valgus, and near-anatomic. I opened the cortex with a  cannulated drill, and then placed the screws into position. Satisfactory fixation was achieved.  The wounds were irrigated copiously, and repaired with Vicryl with Steri-Strips and sterile gauze. There no complications and the patient tolerated the procedure well.  The patient will be weightbearing as tolerated, and will be on chemoprophylaxis for a period of 4 weeks after discharge for DVT prophylaxis.

## 2024-01-14 NOTE — TOC CAGE-AID Note (Signed)
 Transition of Care [facility]) - CAGE-AID Screening   Patient Details  Name: Samantha Henderson MRN: [id number] Date of Birth: Mar 13, 1935  [facility]) CM/SW Contact:    Kiyaan Haq E Bricyn Labrada, LCSW Phone Number: 01/14/2024, 10:25 AM   Clinical Narrative: No SA noted.   CAGE-AID Screening:    Have You Ever Felt You Ought to Cut Down on Your Drinking or Drug Use?: No Have People Annoyed You By Critizing Your Drinking Or Drug Use?: No Have You Felt Bad Or Guilty About Your Drinking Or Drug Use?: No Have You Ever Had a Drink or Used Drugs First Thing In The Morning to Steady Your Nerves or to Get Rid of a Hangover?: No CAGE-AID Score: 0  Substance Abuse Education Offered: No

## 2024-01-14 NOTE — Anesthesia Preprocedure Evaluation (Addendum)
 Anesthesia Evaluation  Patient identified by MRN, date of birth, ID band Patient awake    Reviewed: Allergy & Precautions, NPO status , Patient's Chart, lab work & pertinent test results, reviewed documented beta blocker date and time   Airway Mallampati: I  TM Distance: >3 FB     Dental no notable dental hx. (+) Dental Advisory Given, Teeth Intact   Pulmonary neg pulmonary ROS   Pulmonary exam normal breath sounds clear to auscultation       Cardiovascular hypertension, Pt. on medications pulmonary hypertension+ Peripheral Vascular Disease  Normal cardiovascular exam+ Valvular Problems/Murmurs MR  Rhythm:Regular Rate:Normal  EKG 01/14/24 NSR, 1st Deg AVB, LAFB, Low voltage  Echo 09/24/23 1. Left ventricular ejection fraction, by estimation, is 65 to 70%. The  left ventricle has normal function. The left ventricle has no regional  wall motion abnormalities.   2. Right ventricular systolic function is normal. The right ventricular  size is normal.   3. A small pericardial effusion is present. There is no evidence of  cardiac tamponade.   4. The mitral valve is normal in structure. No evidence of mitral valve  regurgitation. No evidence of mitral stenosis.   5. The aortic valve is tricuspid. Aortic valve regurgitation is not  visualized. No aortic stenosis is present.   6. The inferior vena cava is normal in size with greater than 50%  respiratory variability, suggesting right atrial pressure of 3 mmHg.   7. Agitated saline contrast bubble study was positive with shunting  observed within 3-6 cardiac cycles suggestive of interatrial shunt.    Myocardial perfusion scan 08/27/23   The study is normal. The study is low risk.   No ST deviation was noted.   LV perfusion is abnormal. Defect 1: There is a small defect with mild reduction in uptake present in the mid inferior, inferolateral and inferoseptal location(s) that is fixed.  There is normal wall motion in the defect area. Consistent with artifact.   Left ventricular function is normal. Nuclear stress EF: 73%. The left ventricular ejection fraction is hyperdynamic (>65%). End diastolic cavity size is normal.   CT images were obtained for attenuation correction and were examined for the presence of coronary calcium  when appropriate.   Coronary calcium  was present on the attenuation correction CT images. Moderate coronary calcifications were present. Coronary calcifications were present in the left anterior descending artery and right coronary artery distribution(s).      Neuro/Psych Hx/o temporal arteritis  Neuromuscular disease  negative psych ROS   GI/Hepatic negative GI ROS, Neg liver ROS,,,  Endo/Other  Hypothyroidism  HLD  Renal/GU Renal InsufficiencyRenal disease  negative genitourinary   Musculoskeletal  (+) Arthritis , Osteoarthritis,  Right femoral neck Fx Hx/o polymyalgia   Abdominal   Peds  Hematology negative hematology ROS (+) Eliquis  therapy- last dose yesterday   Anesthesia Other Findings   Reproductive/Obstetrics                              Anesthesia Physical Anesthesia Plan  ASA: 3  Anesthesia Plan: General   Post-op Pain Management: Dilaudid  IV   Induction:   PONV Risk Score and Plan: 3 and Treatment may vary due to age or medical condition, Propofol  infusion and TIVA  Airway Management Planned: LMA  Additional Equipment: None  Intra-op Plan:   Post-operative Plan: Extubation in OR  Informed Consent: I have reviewed the patients History and Physical, chart, labs and discussed the  procedure including the risks, benefits and alternatives for the proposed anesthesia with the patient or authorized representative who has indicated his/her understanding and acceptance.   Patient has DNR.  Discussed DNR with patient and Suspend DNR.   Dental advisory given  Plan Discussed with: CRNA and  Anesthesiologist  Anesthesia Plan Comments:          Anesthesia Quick Evaluation

## 2024-01-14 NOTE — ED Notes (Signed)
 CCMD contacted for pt monitoring. Monitoring successful

## 2024-01-14 NOTE — Anesthesia Procedure Notes (Addendum)
 Procedure Name: LMA Insertion Date/Time: 01/14/2024 3:40 PM  Performed by: Mannie Krystal LABOR, CRNAPre-anesthesia Checklist: Patient identified, Emergency Drugs available, Suction available and Patient being monitored Patient Re-evaluated:Patient Re-evaluated prior to induction Oxygen Delivery Method: Circle system utilized Preoxygenation: Pre-oxygenation with 100% oxygen Induction Type: IV induction LMA: LMA inserted LMA Size: 3.0 Dental Injury: Teeth and Oropharynx as per pre-operative assessment  Comments: LMA placed by NORVA Speed under MD/DO and CRNA supervision.

## 2024-01-15 ENCOUNTER — Encounter (HOSPITAL_COMMUNITY): Payer: Self-pay | Admitting: Orthopedic Surgery

## 2024-01-15 DIAGNOSIS — I4819 Other persistent atrial fibrillation: Secondary | ICD-10-CM | POA: Diagnosis not present

## 2024-01-15 DIAGNOSIS — S72001A Fracture of unspecified part of neck of right femur, initial encounter for closed fracture: Secondary | ICD-10-CM | POA: Diagnosis not present

## 2024-01-15 DIAGNOSIS — I10 Essential (primary) hypertension: Secondary | ICD-10-CM | POA: Diagnosis not present

## 2024-01-15 DIAGNOSIS — E876 Hypokalemia: Secondary | ICD-10-CM | POA: Diagnosis not present

## 2024-01-15 LAB — BASIC METABOLIC PANEL WITH GFR
Anion gap: 11 (ref 5–15)
BUN: 21 mg/dL (ref 8–23)
CO2: 23 mmol/L (ref 22–32)
Calcium: 8.4 mg/dL — ABNORMAL LOW (ref 8.9–10.3)
Chloride: 101 mmol/L (ref 98–111)
Creatinine, Ser: 1.44 mg/dL — ABNORMAL HIGH (ref 0.44–1.00)
GFR, Estimated: 35 mL/min — ABNORMAL LOW (ref >60)
Glucose, Bld: 181 mg/dL — ABNORMAL HIGH (ref 70–99)
Potassium: 4 mmol/L (ref 3.5–5.1)
Sodium: 135 mmol/L (ref 135–145)

## 2024-01-15 LAB — CBC
HCT: 40.3 % (ref 36.0–46.0)
Hemoglobin: 13.2 g/dL (ref 12.0–15.0)
MCH: 29.7 pg (ref 26.0–34.0)
MCHC: 32.8 g/dL (ref 30.0–36.0)
MCV: 90.6 fL (ref 80.0–100.0)
Platelets: UNDETERMINED K/uL (ref 150–400)
RBC: 4.45 MIL/uL (ref 3.87–5.11)
RDW: 12.6 % (ref 11.5–15.5)
WBC: 7.9 K/uL (ref 4.0–10.5)
nRBC: 0 % (ref 0.0–0.2)

## 2024-01-15 MED ORDER — DILTIAZEM HCL 25 MG/5ML IV SOLN
5.0000 mg | Freq: Once | INTRAVENOUS | Status: AC
  Administered 2024-01-15: 5 mg via INTRAVENOUS
  Filled 2024-01-15: qty 5

## 2024-01-15 MED ORDER — APIXABAN 2.5 MG PO TABS
2.5000 mg | ORAL_TABLET | Freq: Two times a day (BID) | ORAL | Status: DC
Start: 2024-01-15 — End: 2024-01-17
  Administered 2024-01-15 – 2024-01-17 (×5): 2.5 mg via ORAL
  Filled 2024-01-15 (×5): qty 1

## 2024-01-15 MED ORDER — CALCIUM CARBONATE ANTACID 500 MG PO CHEW
1.0000 | CHEWABLE_TABLET | Freq: Three times a day (TID) | ORAL | Status: DC | PRN
  Administered 2024-01-15 – 2024-01-16 (×3): 200 mg via ORAL
  Filled 2024-01-15 (×3): qty 1

## 2024-01-15 MED ORDER — HYDROCODONE-ACETAMINOPHEN 5-325 MG PO TABS: 1.0000 | ORAL_TABLET | ORAL | 0 refills | Status: AC | PRN

## 2024-01-15 MED ORDER — DILTIAZEM HCL 25 MG/5ML IV SOLN
5.0000 mg | Freq: Once | INTRAVENOUS | Status: DC
  Filled 2024-01-15 (×2): qty 5

## 2024-01-15 MED ORDER — NITROGLYCERIN 1 MG/10 ML FOR IR/CATH LAB
INTRA_ARTERIAL | Status: AC
  Filled 2024-01-15: qty 10

## 2024-01-15 NOTE — Progress Notes (Signed)
 Subjective: 1 Day Post-Op s/p Procedure(s): FIXATION, FEMUR, NECK, PERCUTANEOUS, USING SCREW   Patient is alert, oriented. Sitting up in bed, about to get up with physical therapy. Had some pain when using bedpan but otherwise doing well. Denies chest pain, SOB, Calf pain. No nausea/vomiting.    Objective:  PE: VITALS:   Vitals:   01/14/24 1753 01/14/24 2232 01/15/24 0535 01/15/24 0801  BP: 132/68 (!) 98/58 (!) 102/57 (!) 118/56  Pulse: 70 72 (!) 56 (!) 59  Resp: 18 17 17 17   Temp: 98.2 F (36.8 C) 98.5 F (36.9 C) 97.6 F (36.4 C) 98 F (36.7 C)  TempSrc: Oral Oral Oral Oral  SpO2: 95% 98% 97% 100%  Weight:      Height:       Sitting up in bed in no acute distress, about to work with PT Sensation intact distally Intact pulses distally Dorsiflexion/Plantar flexion intact Incision: dressing C/D/I  LABS  Results for orders placed or performed during the hospital encounter of 01/13/24 (from the past 24 hours)  CBC     Status: None   Collection Time: 01/15/24  4:43 AM  Result Value Ref Range   WBC 7.9 4.0 - 10.5 K/uL   RBC 4.45 3.87 - 5.11 MIL/uL   Hemoglobin 13.2 12.0 - 15.0 g/dL   HCT 59.6 63.9 - 53.9 %   MCV 90.6 80.0 - 100.0 fL   MCH 29.7 26.0 - 34.0 pg   MCHC 32.8 30.0 - 36.0 g/dL   RDW 87.3 88.4 - 84.4 %   Platelets PLATELET CLUMPS NOTED ON SMEAR, UNABLE TO ESTIMATE 150 - 400 K/uL   nRBC 0.0 0.0 - 0.2 %  Basic metabolic panel     Status: Abnormal   Collection Time: 01/15/24  4:43 AM  Result Value Ref Range   Sodium 135 135 - 145 mmol/L   Potassium 4.0 3.5 - 5.1 mmol/L   Chloride 101 98 - 111 mmol/L   CO2 23 22 - 32 mmol/L   Glucose, Bld 181 (H) 70 - 99 mg/dL   BUN 21 8 - 23 mg/dL   Creatinine, Ser 8.55 (H) 0.44 - 1.00 mg/dL   Calcium  8.4 (L) 8.9 - 10.3 mg/dL   GFR, Estimated 35 (L) >60 mL/min   Anion gap 11 5 - 15    DG HIP UNILAT WITH PELVIS 2-3 VIEWS RIGHT Result Date: 01/14/2024 CLINICAL DATA:  Elective surgery. Fixation of the right  femoral neck. EXAM: DG HIP (WITH OR WITHOUT PELVIS) 2-3V*R* COMPARISON:  01/13/2024 FINDINGS: Intraoperative fluoroscopy is utilized for surgical control purposes. Fluoroscopy time is recorded at 62 seconds. Dose 6.5 mGy. 2 spot fluoroscopic images are obtained. Spot fluoroscopic images demonstrate placement of 3 fixation screws across the subcapital fracture in the right femoral neck. Degenerative changes in the right hip. IMPRESSION: Intraoperative fluoroscopy is utilized for surgical control purposes during internal fixation of subcapital right femoral neck fracture. Electronically Signed   By: Elsie Gravely M.D.   On: 01/14/2024 19:29   DG C-Arm 1-60 Min-No Report Result Date: 01/14/2024 Fluoroscopy was utilized by the requesting physician.  No radiographic interpretation.   DG Knee Complete 4 Views Right Result Date: 01/13/2024 EXAM: 4 VIEW(S) XRAY OF THE KNEE 01/13/2024 06:46:00 PM COMPARISON: None available. CLINICAL HISTORY: Fall. EMS reports they arrive to patient's house due to mechanical fall from a 3 ft table that she was kneeling on about to stand to dust the fall. FINDINGS: BONES AND JOINTS: Total knee arthroplasty hardware  in place. No acute fracture. No focal osseous lesion. No joint dislocation. No significant joint effusion. SOFT TISSUES: The soft tissues are unremarkable. IMPRESSION: 1. No acute abnormalities. Electronically signed by: Pinkie Pebbles MD 01/13/2024 07:10 PM EDT RP Workstation: [id number]   DG Chest 1 View Result Date: 01/13/2024 EXAM: 1 VIEW(S) XRAY OF THE CHEST 01/13/2024 06:46:00 PM COMPARISON: 06/15/2007 CLINICAL HISTORY: Fall. EMS reports they arrive to patient's house due to mechanical fall from a 3 ft table that she was kneeling on about to stand to dust the fall. FINDINGS: LUNGS AND PLEURA: No focal pulmonary opacity. No pulmonary edema. No pleural effusion. No pneumothorax. HEART AND MEDIASTINUM: No acute abnormality of the cardiac and mediastinal silhouettes.  BONES AND SOFT TISSUES: No acute osseous abnormality. IMPRESSION: 1. No acute abnormalities. Electronically signed by: Pinkie Pebbles MD 01/13/2024 07:05 PM EDT RP Workstation: [id number]   DG Hip Unilat W or Wo Pelvis 2-3 Views Right Result Date: 01/13/2024 EXAM: 3 VIEW(S) XRAY OF THE BILATERAL HIP 01/13/2024 06:46:00 PM COMPARISON: None available. CLINICAL HISTORY: fall. Table formatting from the original note was not included.; Images from the original note were not included.; EMS reports they arrive to pts house due to mechanical fall from a 61ft table that she was kneeling on about to stand to dust the fall. FINDINGS: BONES AND JOINTS: Right hip: Impacted subcapital fracture. Left hip: Status post ORIF (Open Reduction Internal Fixation). Bilateral hips: Mild degenerative changes. SOFT TISSUES: The soft tissues are unremarkable. IMPRESSION: 1. Impacted subcapital right femoral neck fracture. Electronically signed by: Pinkie Pebbles MD 01/13/2024 07:03 PM EDT RP Workstation: [id number]    Today's  total administered Morphine  Milligram Equivalents: 10 Yesterday's total administered Morphine  Milligram Equivalents: 42.5  Assessment/Plan: Principal Problem:   Hip fracture (HCC) Active Problems:   Persistent atrial fibrillation (HCC)   Benign essential HTN   AKI (acute kidney injury)   Hypokalemia   Closed displaced fracture of right femoral neck (HCC)    1 Day Post-Op s/p Procedure(s): FIXATION, FEMUR, NECK, PERCUTANEOUS, USING SCREW  Weightbearing: WBAT RLE Insicional and dressing care: Reinforce dressings as needed VTE prophylaxis: Hbg stable this morning, ok to restart home Eliquis  Pain control: continue current regimen Follow - up plan: 2 weeks with Dr. Josefina Dispo: patient lives at home and does not have safe place to discharge back to with family or support, will need SNF placement   Contact information:   Army Daring, PA-C Weekdays 8-5  After hours and holidays please  check [email or web address] for group call information for [facility]  Army MARLA Daring 01/15/2024, 9:59 AM

## 2024-01-15 NOTE — Evaluation (Signed)
 Physical Therapy Evaluation Patient Details Name: Samantha Henderson MRN: [id number] DOB: November 05, 1934 Today's Date: 01/15/2024  History of Present Illness  Pt is 89 yo presenting to [facility] on 9/24 due to a fall off a bench and hitting the R hip. Pt is currenty s/p IMN of the R femur on 9/25. PMH: abdominal bruit, aortic atherosclerosis, aortic regurgitation, arthritis, HTN, bradycardia, carotid senosis, diastolic dysfunction, dizziness, HTN, hypothyroidism, mitral regurgitation, PAC, palitation, pericardial effusion, PFO, plymyalgia, pulmonary HTN, rheumatic fever, sinus tachycardia, temporal arteritis, tricuspid regurgitation, varicose veins of bil LE  Clinical Impression  Pt currently is presenting at Min A for bed mobility, sit to stand and transfers. Pt was unable to progress gait due to weakness and pain in the RLE. Pt lives alone and is ind at baseline. Due to pt current functional status, home set up and available assistance at home recommending skilled physical therapy services < 3 hours/day in order to address strength, balance and functional mobility to decrease risk for falls, injury, immobility, skin break down and re-hospitalization.          If plan is discharge home, recommend the following: Assist for transportation;Assistance with cooking/housework;Help with stairs or ramp for entrance   Can travel by private vehicle   No    Equipment Recommendations BSC/3in1     Functional Status Assessment Patient has had a recent decline in their functional status and demonstrates the ability to make significant improvements in function in a reasonable and predictable amount of time.     Precautions / Restrictions Precautions Precautions: Fall Recall of Precautions/Restrictions: Intact Restrictions Weight Bearing Restrictions Per Provider Order: Yes RLE Weight Bearing Per Provider Order: Weight bearing as tolerated      Mobility  Bed Mobility Overal bed mobility: Needs Assistance Bed  Mobility: Supine to Sit     Supine to sit: Min assist, HOB elevated, Used rails     General bed mobility comments: Min A with for RLE and CGA for trunk to progress. Encouragement and increased time to get to EOB>    Transfers Overall transfer level: Needs assistance Equipment used: Rolling walker (2 wheels) Transfers: Sit to/from Stand, Bed to chair/wheelchair/BSC Sit to Stand: Min assist   Step pivot transfers: Min assist       General transfer comment: Min A for stability, assist with AD and initial momentum to get to standing due to pain/weakness.    Ambulation/Gait Ambulation/Gait assistance: Min assist Gait Distance (Feet): 4 Feet Assistive device: Rolling walker (2 wheels) Gait Pattern/deviations: Step-to pattern Gait velocity: decreased Gait velocity interpretation: <1.31 ft/sec, indicative of household ambulator   General Gait Details: Pt took steps from EOB to BSC (~ 1 ft) and then from [facility] to recliner ~ 4 ft taking side steps with RW with Min A for balance and support in standing as well as some assist with AD for navigation with verbal cues for safety     Balance Overall balance assessment: Needs assistance Sitting-balance support: Bilateral upper extremity supported, Feet supported, Feet unsupported Sitting balance-Leahy Scale: Fair Sitting balance - Comments: leaning to the L to unweight the R Postural control: Left lateral lean Standing balance support: Bilateral upper extremity supported, During functional activity, Reliant on assistive device for balance Standing balance-Leahy Scale: Poor Standing balance comment: requires heavy bil UE support and external support.       Pertinent Vitals/Pain Pain Assessment Pain Assessment: Faces Faces Pain Scale: Hurts even more Breathing: normal Negative Vocalization: occasional moan/groan, low speech, negative/disapproving quality Facial Expression: sad,  frightened, frown Body Language: tense, distressed pacing,  fidgeting Consolability: no need to console PAINAD Score: 3 Pain Location: R hip/operative site Pain Descriptors / Indicators: Aching, Discomfort, Guarding, Grimacing Pain Intervention(s): Monitored during session, Limited activity within patient's tolerance, Premedicated before session    Home Living Family/patient expects to be discharged to:: Skilled nursing facility Living Arrangements: Alone                 Additional Comments: Pt lives in one level town home has RW and [facility], walk in shower, shower seat, grab bars in shower and by toilet and a ramped entrance. Ind at baseline without an AD and denies falls besides recent fall.    Prior Function Prior Level of Function : Independent/Modified Independent;Driving             Mobility Comments: ind without an AD ADLs Comments: Ind     Extremity/Trunk Assessment   Upper Extremity Assessment Upper Extremity Assessment: Defer to OT evaluation;Generalized weakness    Lower Extremity Assessment Lower Extremity Assessment: Generalized weakness;RLE deficits/detail RLE Deficits / Details: recent break and surgery RLE: Unable to fully assess due to pain    Cervical / Trunk Assessment Cervical / Trunk Assessment: Kyphotic  Communication   Communication Communication: Impaired Factors Affecting Communication: Hearing impaired    Cognition Arousal: Alert Behavior During Therapy: WFL for tasks assessed/performed   PT - Cognitive impairments: No apparent impairments     Following commands: Intact       Cueing Cueing Techniques: Verbal cues     General Comments General comments (skin integrity, edema, etc.): no signs/symptoms of cardiac/respiratory distress        Assessment/Plan    PT Assessment Patient needs continued PT services  PT Problem List Decreased strength;Decreased activity tolerance;Decreased balance;Decreased mobility;Decreased safety awareness;Pain       PT Treatment Interventions DME  instruction;Balance training;Gait training;Functional mobility training;Therapeutic activities;Therapeutic exercise;Patient/family education    PT Goals (Current goals can be found in the Care Plan section)  Acute Rehab PT Goals Patient Stated Goal: to improve mobility and then return home. PT Goal Formulation: With patient Time For Goal Achievement: 01/29/24 Potential to Achieve Goals: Good    Frequency Min 4X/week        AM-PAC PT 6 Clicks Mobility  Outcome Measure Help needed turning from your back to your side while in a flat bed without using bedrails?: A Little Help needed moving from lying on your back to sitting on the side of a flat bed without using bedrails?: A Little Help needed moving to and from a bed to a chair (including a wheelchair)?: A Little Help needed standing up from a chair using your arms (e.g., wheelchair or bedside chair)?: A Little Help needed to walk in hospital room?: A Lot Help needed climbing 3-5 steps with a railing? : Total 6 Click Score: 15    End of Session Equipment Utilized During Treatment: Gait belt Activity Tolerance: Patient tolerated treatment well Patient left: in bed;with call bell/phone within reach Nurse Communication: Mobility status PT Visit Diagnosis: Unsteadiness on feet (R26.81);Other abnormalities of gait and mobility (R26.89);Muscle weakness (generalized) (M62.81)    Time: 9064-8997 PT Time Calculation (min) (ACUTE ONLY): 27 min   Charges:   PT Evaluation $PT Eval Low Complexity: 1 Low PT Treatments $Therapeutic Activity: 8-22 mins PT General Charges $$ ACUTE PT VISIT: 1 Visit        Dorothyann Maier, DPT, CLT  Acute Rehabilitation Services Office: [phone] (Secure chat preferred)   Dorothyann  VEAR Maier 01/15/2024, 10:16 AM

## 2024-01-15 NOTE — Progress Notes (Addendum)
 CCMD called patient's HR 120-150's and Afib, patient showing no s/s of distress and says, I feel fine. Notified Dr. Concepcion Riser. MD placed new order for one-time dose of IV Cardizem .

## 2024-01-15 NOTE — Progress Notes (Signed)
 CCMD called patient has converted back to normal sinus and HR currently 62. Received IV Cardizem  from Main Pharmacy. Notified Dr. Therese Riser. Per MD hold Cardizem  for now.

## 2024-01-15 NOTE — Anesthesia Postprocedure Evaluation (Signed)
 Anesthesia Post Note  Patient: Samantha Henderson  Procedure(s) Performed: FIXATION, FEMUR, NECK, PERCUTANEOUS, USING SCREW (Right: Hip)     Patient location during evaluation: PACU Anesthesia Type: General Level of consciousness: awake and alert Pain management: pain level controlled Vital Signs Assessment: post-procedure vital signs reviewed and stable Respiratory status: spontaneous breathing, nonlabored ventilation and respiratory function stable Cardiovascular status: blood pressure returned to baseline and stable Postop Assessment: no apparent nausea or vomiting Anesthetic complications: no   No notable events documented.  Last Vitals:  Vitals:   01/15/24 1229 01/15/24 1627  BP: (!) 116/53 139/82  Pulse: 88 (!) 55  Resp: 17 18  Temp: 36.7 C 36.6 C  SpO2: 95% (!) 86%    Last Pain:  Vitals:   01/15/24 1644  TempSrc:   PainSc: 9    Pain Goal:                   Butler Levander Pinal

## 2024-01-15 NOTE — Care Management Important Message (Signed)
 Important Message  Patient Details  Name: Samantha Henderson MRN: [id number] Date of Birth: May 21, 1934   Important Message Given:  Yes - Medicare IM     Jon Cruel 01/15/2024, 1:04 PM

## 2024-01-15 NOTE — Progress Notes (Signed)
 Progress Note   Patient: Samantha Henderson FMW:[id number] DOB: 1934-04-27 DOA: 01/13/2024     2 DOS: the patient was seen and examined on 01/15/2024   Brief hospital course: LAVAYA DEFREITAS is a 89 y.o. female with medical history significant of paroxysmal A-fib on Eliquis , HFpEF, PFO, hypertension, hyperlipidemia, hypothyroidism, left femoral neck fracture status post left hip pinning in December 2017, right total knee replacement in January 2025 polymyalgia rheumatica, temporal arteritis presenting with complaints of pain in her right hip and knee after a mechanical fall.   She was noted to have impacted subcapital right femoral neck fracture, admitted to [facility] service with orthopedic consultation.  Assessment and Plan: Right femoral neck fracture Secondary to a mechanical fall.   S/p Right hip percutaneous pinning 01/13/25. Orthopedic follow up appreciated. Resumed Eliquis  today. Continue pain control.   WBAT, PT OT follow up. No help at home. She may benefit from SNF.  Acute on CKD stage III A- Kidney function better. Avoid nephrotoxic drugs. Hold hydrochlorothiazide . Monitor daily renal function.   Mild hypokalemia Improved with supplementation.   Paroxysmal A-fib Continue telemetry.  Resumed eliquis .  Continue diltiazem .   Chronic HFpEF No signs of volume overload.   Resumed Cardizem .   Hypertension Continue diltiazem . Hold hydrochlorothiazide  due to AKI.   Monitor blood pressure closely.   Hyperlipidemia Continue atorvastatin .   Hypothyroidism Continue levothyroxine .   Chronic constipation Continue home MiraLAX .   Mood disorder Continue trazodone .     Out of bed to chair. Incentive spirometry. Nursing supportive care. Fall, aspiration precautions. Diet:  Diet Orders (From admission, onward)     Start     Ordered   01/14/24 1737  Diet regular Room service appropriate? Yes; Fluid consistency: Thin  Diet effective now       Question Answer Comment  Room service  appropriate? Yes   Fluid consistency: Thin      01/14/24 1737           DVT prophylaxis: apixaban  (ELIQUIS ) tablet 2.5 mg Start: 01/15/24 1000 SCDs Start: 01/14/24 1737 SCDs Start: 01/14/24 0023 apixaban  (ELIQUIS ) tablet 2.5 mg  Level of care: Telemetry Medical   Code Status: Do not attempt resuscitation (DNR) PRE-ARREST INTERVENTIONS DESIRED  Subjective: Patient is seen and examined today morning.  She denies any pain. Did not get out of bed. Advised to work with PT.  Physical Exam: Vitals:   01/14/24 1753 01/14/24 2232 01/15/24 0535 01/15/24 0801  BP: 132/68 (!) 98/58 (!) 102/57 (!) 118/56  Pulse: 70 72 (!) 56 (!) 59  Resp: 18 17 17 17   Temp: 98.2 F (36.8 C) 98.5 F (36.9 C) 97.6 F (36.4 C) 98 F (36.7 C)  TempSrc: Oral Oral Oral Oral  SpO2: 95% 98% 97% 100%  Weight:      Height:        General - Elderly Caucasian female, no apparent distress HEENT - PERRLA, EOMI, atraumatic head, non tender sinuses. Lung - Clear, no rales, rhonchi, wheezes. Heart - S1, S2 heard, no murmurs, rubs, no pedal edema. Abdomen - Soft, non tender, bowel sounds good Neuro - Alert, awake and oriented, non focal exam. Skin - Warm and dry. Right hip dressing noted.  Data Reviewed:      Latest Ref Rng & Units 01/15/2024    4:43 AM 01/14/2024    4:30 AM 01/13/2024    5:50 PM  CBC  WBC 4.0 - 10.5 K/uL 7.9  10.7  8.5   Hemoglobin 12.0 - 15.0 g/dL 13.2  12.9  13.4   Hematocrit 36.0 - 46.0 % 40.3  39.6  39.5   Platelets 150 - 400 K/uL PLATELET CLUMPS NOTED ON SMEAR, UNABLE TO ESTIMATE  173  PLATELET CLUMPS NOTED ON SMEAR, UNABLE TO ESTIMATE       Latest Ref Rng & Units 01/15/2024    4:43 AM 01/14/2024    4:30 AM 01/13/2024    8:10 PM  BMP  Glucose 70 - 99 mg/dL 818  889  898   BUN 8 - 23 mg/dL 21  24  28    Creatinine 0.44 - 1.00 mg/dL 8.55  8.53  8.42   Sodium 135 - 145 mmol/L 135  136  139   Potassium 3.5 - 5.1 mmol/L 4.0  4.2  3.1   Chloride 98 - 111 mmol/L 101  103  103   CO2 22  - 32 mmol/L 23  25  25    Calcium  8.9 - 10.3 mg/dL 8.4  9.0  89.9    DG HIP UNILAT WITH PELVIS 2-3 VIEWS RIGHT Result Date: 01/14/2024 CLINICAL DATA:  Elective surgery. Fixation of the right femoral neck. EXAM: DG HIP (WITH OR WITHOUT PELVIS) 2-3V*R* COMPARISON:  01/13/2024 FINDINGS: Intraoperative fluoroscopy is utilized for surgical control purposes. Fluoroscopy time is recorded at 62 seconds. Dose 6.5 mGy. 2 spot fluoroscopic images are obtained. Spot fluoroscopic images demonstrate placement of 3 fixation screws across the subcapital fracture in the right femoral neck. Degenerative changes in the right hip. IMPRESSION: Intraoperative fluoroscopy is utilized for surgical control purposes during internal fixation of subcapital right femoral neck fracture. Electronically Signed   By: Elsie Gravely M.D.   On: 01/14/2024 19:29   DG C-Arm 1-60 Min-No Report Result Date: 01/14/2024 Fluoroscopy was utilized by the requesting physician.  No radiographic interpretation.   DG Knee Complete 4 Views Right Result Date: 01/13/2024 EXAM: 4 VIEW(S) XRAY OF THE KNEE 01/13/2024 06:46:00 PM COMPARISON: None available. CLINICAL HISTORY: Fall. EMS reports they arrive to patient's house due to mechanical fall from a 3 ft table that she was kneeling on about to stand to dust the fall. FINDINGS: BONES AND JOINTS: Total knee arthroplasty hardware in place. No acute fracture. No focal osseous lesion. No joint dislocation. No significant joint effusion. SOFT TISSUES: The soft tissues are unremarkable. IMPRESSION: 1. No acute abnormalities. Electronically signed by: Pinkie Pebbles MD 01/13/2024 07:10 PM EDT RP Workstation: [id number]   DG Chest 1 View Result Date: 01/13/2024 EXAM: 1 VIEW(S) XRAY OF THE CHEST 01/13/2024 06:46:00 PM COMPARISON: 06/15/2007 CLINICAL HISTORY: Fall. EMS reports they arrive to patient's house due to mechanical fall from a 3 ft table that she was kneeling on about to stand to dust the fall.  FINDINGS: LUNGS AND PLEURA: No focal pulmonary opacity. No pulmonary edema. No pleural effusion. No pneumothorax. HEART AND MEDIASTINUM: No acute abnormality of the cardiac and mediastinal silhouettes. BONES AND SOFT TISSUES: No acute osseous abnormality. IMPRESSION: 1. No acute abnormalities. Electronically signed by: Pinkie Pebbles MD 01/13/2024 07:05 PM EDT RP Workstation: [id number]   DG Hip Unilat W or Wo Pelvis 2-3 Views Right Result Date: 01/13/2024 EXAM: 3 VIEW(S) XRAY OF THE BILATERAL HIP 01/13/2024 06:46:00 PM COMPARISON: None available. CLINICAL HISTORY: fall. Table formatting from the original note was not included.; Images from the original note were not included.; EMS reports they arrive to pts house due to mechanical fall from a 46ft table that she was kneeling on about to stand to dust the fall. FINDINGS: BONES AND JOINTS: Right hip: Impacted  subcapital fracture. Left hip: Status post ORIF (Open Reduction Internal Fixation). Bilateral hips: Mild degenerative changes. SOFT TISSUES: The soft tissues are unremarkable. IMPRESSION: 1. Impacted subcapital right femoral neck fracture. Electronically signed by: Pinkie Pebbles MD 01/13/2024 07:03 PM EDT RP Workstation: [id number]    Family Communication: Discussed with patient, she understand and agree. All questions answered.  Disposition: Status is: Inpatient Remains inpatient appropriate because: PT/ OT, pain control.  Planned Discharge Destination: Skilled nursing facility     Time spent: 43 minutes  Author: Concepcion Riser, MD 01/15/2024 12:15 PM Secure chat 7am to 7pm For on call review [email or web address].

## 2024-01-15 NOTE — Plan of Care (Signed)
  Problem: Health Behavior/Discharge Planning: Goal: Ability to manage health-related needs will improve Outcome: Progressing   Problem: Clinical Measurements: Goal: Will remain free from infection Outcome: Progressing   Problem: Activity: Goal: Risk for activity intolerance will decrease Outcome: Progressing   Problem: Nutrition: Goal: Adequate nutrition will be maintained Outcome: Progressing   Problem: Coping: Goal: Level of anxiety will decrease Outcome: Progressing   Problem: Elimination: Goal: Will not experience complications related to bowel motility Outcome: Progressing   Problem: Safety: Goal: Ability to remain free from injury will improve Outcome: Progressing

## 2024-01-16 DIAGNOSIS — E876 Hypokalemia: Secondary | ICD-10-CM | POA: Diagnosis not present

## 2024-01-16 DIAGNOSIS — I4819 Other persistent atrial fibrillation: Secondary | ICD-10-CM | POA: Diagnosis not present

## 2024-01-16 DIAGNOSIS — I10 Essential (primary) hypertension: Secondary | ICD-10-CM | POA: Diagnosis not present

## 2024-01-16 DIAGNOSIS — S72001A Fracture of unspecified part of neck of right femur, initial encounter for closed fracture: Secondary | ICD-10-CM | POA: Diagnosis not present

## 2024-01-16 LAB — CBC
HCT: 37 % (ref 36.0–46.0)
Hemoglobin: 12.3 g/dL (ref 12.0–15.0)
MCH: 29.6 pg (ref 26.0–34.0)
MCHC: 33.2 g/dL (ref 30.0–36.0)
MCV: 89.2 fL (ref 80.0–100.0)
Platelets: UNDETERMINED K/uL (ref 150–400)
RBC: 4.15 MIL/uL (ref 3.87–5.11)
RDW: 12.9 % (ref 11.5–15.5)
WBC: 10.2 K/uL (ref 4.0–10.5)
nRBC: 0 % (ref 0.0–0.2)

## 2024-01-16 LAB — BASIC METABOLIC PANEL WITH GFR
Anion gap: 10 (ref 5–15)
BUN: 16 mg/dL (ref 8–23)
CO2: 24 mmol/L (ref 22–32)
Calcium: 9 mg/dL (ref 8.9–10.3)
Chloride: 106 mmol/L (ref 98–111)
Creatinine, Ser: 1.19 mg/dL — ABNORMAL HIGH (ref 0.44–1.00)
GFR, Estimated: 44 mL/min — ABNORMAL LOW (ref >60)
Glucose, Bld: 104 mg/dL — ABNORMAL HIGH (ref 70–99)
Potassium: 4.3 mmol/L (ref 3.5–5.1)
Sodium: 140 mmol/L (ref 135–145)

## 2024-01-16 NOTE — TOC Initial Note (Addendum)
 Transition of Care [facility]) - Initial/Assessment Note    Patient Details  Name: Samantha Henderson MRN: [id number] Date of Birth: [birth year]  [facility]) CM/SW Contact:    Samantha Cordella Simmonds, LCSW Phone Number: 01/16/2024, 11:01 AM  Clinical Narrative:     CSW met with pt regarding PT recommendation for SNF.  Pt agreeable to SNF, requesting Samantha Henderson, permission given to send out referral in the hub.  Pt from home alone, no current services.  Permission given to speak with daughter Samantha Henderson.  Referral sent out in hub for SNF, CSW reached out to Samantha Henderson/Samantha Henderson to review.             1140: Samantha Henderson does offer bed.  Auth request made with Samantha Henderson/HTA.    Expected Discharge Plan: Skilled Nursing Facility Barriers to Discharge: Continued Medical Work up, SNF Pending bed offer   Patient Goals and CMS Choice Patient states their goals for this hospitalization and ongoing recovery are:: do what I've been doing   Choice offered to / list presented to : Patient (requesting Samantha Henderson)      Expected Discharge Plan and Services In-house Referral: Clinical Social Work   Post Acute Care Choice: Skilled Nursing Facility Living arrangements for the past 2 months: Single Family Home                                      Prior Living Arrangements/Services Living arrangements for the past 2 months: Single Family Home Lives with:: Self Patient language and need for interpreter reviewed:: Yes Do you feel safe going back to the place where you live?: Yes      Need for Family Participation in Patient Care: Yes (Comment) Care giver support system in place?: Yes (comment) Current home services: Other (comment) (none) Criminal Activity/Legal Involvement Pertinent to Current Situation/Hospitalization: No - Comment as needed  Activities of Daily Living   ADL Screening (condition at time of admission) Independently performs ADLs?: Yes (appropriate for developmental age) Is the patient deaf or have  difficulty hearing?: No Does the patient have difficulty seeing, even when wearing glasses/contacts?: No Does the patient have difficulty concentrating, remembering, or making decisions?: No  Permission Sought/Granted Permission sought to share information with : Family Supports Permission granted to share information with : Yes, Verbal Permission Granted  Share Information with NAME: daughter Samantha Henderson  Permission granted to share info w AGENCY: SNF        Emotional Assessment Appearance:: Appears stated age Attitude/Demeanor/Rapport: Engaged Affect (typically observed): Appropriate, Pleasant Orientation: : Oriented to Self, Oriented to Place, Oriented to  Time, Oriented to Situation      Admission diagnosis:  Hip fracture (HCC) [S72.009A] Closed displaced fracture of right femoral neck (HCC) [S72.001A] Patient Active Problem List   Diagnosis Date Noted   AKI (acute kidney injury) 01/14/2024   Hypokalemia 01/14/2024   Closed displaced fracture of right femoral neck (HCC) 01/14/2024   Hip fracture (HCC) 01/13/2024   Senile osteoporosis 11/26/2023   PFO (patent foramen ovale)    Aortic atherosclerosis    S/P total knee replacement, right 05/14/2022   S/P total knee arthroplasty, right 05/13/2022   Carotid stenosis    Varicose vein of leg 07/19/2019   Mitral regurgitation 11/15/2018   Benign essential HTN 01/27/2017   Closed subcapital fracture of left femur (HCC) 04/12/2016   Left displaced femoral neck fracture (HCC) 04/12/2016   Joint pain 03/20/2014  Hepatic cyst 12/06/2013   Persistent atrial fibrillation (HCC) 11/23/2013   Temporal arteritis (HCC)    PAC (premature atrial contraction)    Palpitation    Leg cramps    Rheumatic fever    Aortic regurgitation    Ejection fraction    Dizziness    Diastolic dysfunction    Abdominal bruit    Bradycardia    Constipation    Arm weakness    Hypothyroidism 08/01/2008   Polymyalgia rheumatica 08/01/2008   PCP:  Cleotilde Planas, MD Pharmacy:   [facility] - [place], [place] - [address] [place] [address] [place] [address] [place] [address] Phone: [phone] Fax: [phone]  [facility] - [facility] [address] [place] [address] Phone: [phone] Fax: [phone]  [facility] [address] [place], [place] - [address] [place] [address] Phone: [phone] Fax: [phone]     Social Drivers of Health (SDOH) Social History: SDOH Screenings   Food Insecurity: No Food Insecurity (01/14/2024)  Housing: Low Risk  (01/14/2024)  Transportation Needs: No Transportation Needs (01/14/2024)  Utilities: Not At Risk (01/14/2024)  Social Connections: Unknown (01/14/2024)  Recent Concern: Social Connections - Socially Isolated (01/14/2024)  Tobacco Use: Low Risk  (01/14/2024)   SDOH Interventions:     Readmission Risk Interventions     No data to display

## 2024-01-16 NOTE — Progress Notes (Signed)
 Pt bp and hr were low this AM, cardizem  held. Provider notified via secure chat.  01/16/24 0743  Vitals  Temp 98.2 F (36.8 C)  Temp Source Oral  BP (!) 106/59  MAP (mmHg) 70  BP Location Left Arm  BP Method Automatic  Patient Position (if appropriate) Lying  Pulse Rate (!) 41  Pulse Rate Source Monitor  Resp 16  MEWS COLOR  MEWS Score Color Green  Oxygen Therapy  SpO2 93 %  O2 Device Room Air  MEWS Score  MEWS Temp 0  MEWS Systolic 0  MEWS Pulse 1  MEWS RR 0  MEWS LOC 0  MEWS Score 1

## 2024-01-16 NOTE — Plan of Care (Signed)
  Problem: Health Behavior/Discharge Planning: Goal: Ability to manage health-related needs will improve Outcome: Progressing   Problem: Clinical Measurements: Goal: Will remain free from infection Outcome: Progressing   Problem: Nutrition: Goal: Adequate nutrition will be maintained Outcome: Progressing   Problem: Coping: Goal: Level of anxiety will decrease Outcome: Progressing   Problem: Safety: Goal: Ability to remain free from injury will improve Outcome: Progressing   Problem: Skin Integrity: Goal: Risk for impaired skin integrity will decrease Outcome: Progressing   Problem: Activity: Goal: Ability to ambulate and perform ADLs will improve Outcome: Progressing

## 2024-01-16 NOTE — Progress Notes (Signed)
 Physical Therapy Treatment Patient Details Name: Samantha Henderson MRN: [id number] DOB: [birth year] Today's Date: 01/16/2024   History of Present Illness Pt is 89 yo presenting to [facility] on 9/24 due to a fall off a bench and hitting the R hip. Pt is currenty s/p IMN of the R femur on 9/25. PMH: abdominal bruit, aortic atherosclerosis, aortic regurgitation, arthritis, HTN, bradycardia, carotid senosis, diastolic dysfunction, dizziness, HTN, hypothyroidism, mitral regurgitation, PAC, palitation, pericardial effusion, PFO, plymyalgia, pulmonary HTN, rheumatic fever, sinus tachycardia, temporal arteritis, tricuspid regurgitation, varicose veins of bil LE    PT Comments  Pt received in supine and agreeable to session. Pt able to tolerate increased gait distance this session. Pt able to complete mobility tasks with up to min A. Pt continues to be limited by RLE pain and weakness, but reports improved pain today.  Pt continues to benefit from PT services to progress toward functional mobility goals.    If plan is discharge home, recommend the following: Assist for transportation;Assistance with cooking/housework;Help with stairs or ramp for entrance   Can travel by private vehicle     No  Equipment Recommendations  BSC/3in1    Recommendations for Other Services       Precautions / Restrictions Precautions Precautions: Fall Recall of Precautions/Restrictions: Intact Restrictions Weight Bearing Restrictions Per Provider Order: Yes RLE Weight Bearing Per Provider Order: Weight bearing as tolerated     Mobility  Bed Mobility Overal bed mobility: Needs Assistance Bed Mobility: Supine to Sit     Supine to sit: HOB elevated, Used rails, Contact guard     General bed mobility comments: increased time/effort    Transfers Overall transfer level: Needs assistance Equipment used: Rolling walker (2 wheels) Transfers: Sit to/from Stand Sit to Stand: Min assist, From elevated surface            General transfer comment: From EOB with min A for power up and cues for hand placement    Ambulation/Gait Ambulation/Gait assistance: Contact guard assist Gait Distance (Feet): 40 Feet Assistive device: Rolling walker (2 wheels) Gait Pattern/deviations: Step-to pattern, Decreased stride length, Decreased stance time - right, Antalgic Gait velocity: decreased     General Gait Details: Short steps with increased difficulty advancing RLE. RLE remained mostly extended, but improved knee flexion during swing through with cues. Increased time during turns   Comptroller Bed    Modified Rankin (Stroke Patients Only)       Balance Overall balance assessment: Needs assistance Sitting-balance support: Bilateral upper extremity supported, Feet supported, Feet unsupported Sitting balance-Leahy Scale: Fair     Standing balance support: Bilateral upper extremity supported, During functional activity, Reliant on assistive device for balance Standing balance-Leahy Scale: Poor Standing balance comment: reliant on RW support                            Communication Communication Communication: Impaired Factors Affecting Communication: Hearing impaired  Cognition Arousal: Alert Behavior During Therapy: WFL for tasks assessed/performed   PT - Cognitive impairments: No apparent impairments                         Following commands: Intact      Cueing Cueing Techniques: Verbal cues  Exercises General Exercises - Lower Extremity Quad Sets: AROM, Seated, Both, 5 reps Long Arc Quad: AROM, Seated, Right,  5 reps Heel Slides: AROM, Seated, Right, 5 reps Hip Flexion/Marching: AROM, Seated, Right, 5 reps    General Comments        Pertinent Vitals/Pain Pain Assessment Pain Assessment: Faces Faces Pain Scale: Hurts little more Pain Location: R hip/operative site Pain Descriptors / Indicators: Aching, Discomfort,  Guarding, Grimacing Pain Intervention(s): Limited activity within patient's tolerance, Monitored during session, Repositioned     PT Goals (current goals can now be found in the care plan section) Acute Rehab PT Goals Patient Stated Goal: to improve mobility and then return home. PT Goal Formulation: With patient Time For Goal Achievement: 01/29/24 Progress towards PT goals: Progressing toward goals    Frequency    Min 4X/week       AM-PAC PT 6 Clicks Mobility   Outcome Measure  Help needed turning from your back to your side while in a flat bed without using bedrails?: A Little Help needed moving from lying on your back to sitting on the side of a flat bed without using bedrails?: A Little Help needed moving to and from a bed to a chair (including a wheelchair)?: A Little Help needed standing up from a chair using your arms (e.g., wheelchair or bedside chair)?: A Little Help needed to walk in hospital room?: A Little Help needed climbing 3-5 steps with a railing? : Total 6 Click Score: 16    End of Session Equipment Utilized During Treatment: Gait belt Activity Tolerance: Patient tolerated treatment well Patient left: with call bell/phone within reach;in chair Nurse Communication: Mobility status PT Visit Diagnosis: Unsteadiness on feet (R26.81);Other abnormalities of gait and mobility (R26.89);Muscle weakness (generalized) (M62.81)     Time: 8462-8443 PT Time Calculation (min) (ACUTE ONLY): 19 min  Charges:    $Gait Training: 8-22 mins PT General Charges $$ ACUTE PT VISIT: 1 Visit                     Darryle George, PTA Acute Rehabilitation Services Secure Chat Preferred  [phone]    Darryle George 01/16/2024, 4:03 PM

## 2024-01-16 NOTE — NC FL2 (Signed)
 [place]  MEDICAID FL2 LEVEL OF CARE FORM     IDENTIFICATION  Patient Name: Samantha Henderson Birthdate: 1934/11/07 Sex: female Admission Date (Current Location): 01/13/2024  [facility] and [place] Number:  Producer, television/film/video and Address:  [facility], [address], [place], [place] [address]      Provider Number: [id number]  Attending Physician Name and Address:  Darci Pore, MD  Relative Name and Phone Number:  [facility] Daughter   [phone]    Current Level of Care: Hospital Recommended Level of Care: Skilled Nursing Facility Prior Approval Number:    Date Approved/Denied:   PASRR Number: [id number]  Discharge Plan: SNF    Current Diagnoses: Patient Active Problem List   Diagnosis Date Noted   AKI (acute kidney injury) 01/14/2024   Hypokalemia 01/14/2024   Closed displaced fracture of right femoral neck (HCC) 01/14/2024   Hip fracture (HCC) 01/13/2024   Senile osteoporosis 11/26/2023   PFO (patent foramen ovale)    Aortic atherosclerosis    S/P total knee replacement, right 05/14/2022   S/P total knee arthroplasty, right 05/13/2022   Carotid stenosis    Varicose vein of leg 07/19/2019   Mitral regurgitation 11/15/2018   Benign essential HTN 01/27/2017   Closed subcapital fracture of left femur (HCC) 04/12/2016   Left displaced femoral neck fracture (HCC) 04/12/2016   Joint pain 03/20/2014   Hepatic cyst 12/06/2013   Persistent atrial fibrillation (HCC) 11/23/2013   Temporal arteritis (HCC)    PAC (premature atrial contraction)    Palpitation    Leg cramps    Rheumatic fever    Aortic regurgitation    Ejection fraction    Dizziness    Diastolic dysfunction    Abdominal bruit    Bradycardia    Constipation    Arm weakness    Hypothyroidism 08/01/2008   Polymyalgia rheumatica 08/01/2008    Orientation RESPIRATION BLADDER Height & Weight     Self, Time, Situation, Place  Normal Continent Weight: 115 lb  (52.2 kg) Height:  5' 6 (167.6 cm)  BEHAVIORAL SYMPTOMS/MOOD NEUROLOGICAL BOWEL NUTRITION STATUS        Diet (see discharge summary)  AMBULATORY STATUS COMMUNICATION OF NEEDS Skin   Limited Assist Verbally Surgical wounds                       Personal Care Assistance Level of Assistance  Bathing, Feeding, Dressing Bathing Assistance: Limited assistance Feeding assistance: Limited assistance Dressing Assistance: Limited assistance     Functional Limitations Info  Sight, Hearing, Speech Sight Info: Adequate Hearing Info: Adequate Speech Info: Adequate    SPECIAL CARE FACTORS FREQUENCY  PT (By licensed PT), OT (By licensed OT)     PT Frequency: 5x week OT Frequency: 5x week            Contractures Contractures Info: Not present    Additional Factors Info  Code Status, Allergies Code Status Info: DNR Allergies Info: Niacin And Related, Amlodipine , Cefdinir, Celecoxib, Codeine, Nebivolol Hcl, Pregabalin, Ciprofloxacin, Lisinopril, Plaquenil  (Hydroxychloroquine ), Sulfamethoxazole-trimethoprim, Augmentin (Amoxicillin-pot Clavulanate), Cephalexin, Sulfonamide Derivatives           Current Medications (01/16/2024):  This is the current hospital active medication list Current Facility-Administered Medications  Medication Dose Route Frequency Provider Last Rate Last Admin   acetaminophen  (TYLENOL ) tablet 325-650 mg  325-650 mg Oral Q6H PRN Brown, Blaine K, PA-C       apixaban  (ELIQUIS ) tablet 2.5 mg  2.5 mg Oral BID  Merilee Linsey I, RPH   2.5 mg at 01/16/24 0902   atorvastatin  (LIPITOR) tablet 20 mg  20 mg Oral Daily Brown, Blaine K, PA-C   20 mg at 01/16/24 0902   bisacodyl  (DULCOLAX) suppository 10 mg  10 mg Rectal Daily PRN Brown, Blaine K, PA-C       calcium  carbonate (TUMS - dosed in mg elemental calcium ) chewable tablet 200 mg of elemental calcium   1 tablet Oral TID PRN Sreeram, Narendranath, MD   200 mg of elemental calcium  at 01/15/24 2328   diltiazem   (CARDIZEM  CD) 24 hr capsule 120 mg  120 mg Oral Daily Brown, Blaine K, PA-C   120 mg at 01/15/24 9142   docusate sodium  (COLACE) capsule 100 mg  100 mg Oral BID Brown, Blaine K, PA-C   100 mg at 01/16/24 9097   ferrous sulfate  tablet 325 mg  325 mg Oral TID PC Brown, Blaine K, PA-C   325 mg at 01/16/24 0902   HYDROcodone -acetaminophen  (NORCO) 7.5-325 MG per tablet 1-2 tablet  1-2 tablet Oral Q4H PRN Brown, Blaine K, PA-C   1 tablet at 01/14/24 2025   HYDROcodone -acetaminophen  (NORCO/VICODIN) 5-325 MG per tablet 1-2 tablet  1-2 tablet Oral Q4H PRN Brown, Blaine K, PA-C   2 tablet at 01/16/24 9344   levothyroxine  (SYNTHROID ) tablet 100 mcg  100 mcg Oral Daily Brown, Blaine K, PA-C   100 mcg at 01/16/24 0630   magnesium  citrate solution 1 Bottle  1 Bottle Oral Once PRN Brown, Blaine K, PA-C       melatonin tablet 10 mg  10 mg Oral QHS Brown, Blaine K, PA-C   10 mg at 01/15/24 2030   menthol  (CEPACOL) lozenge 3 mg  1 lozenge Oral PRN Brown, Blaine K, PA-C       Or   phenol (CHLORASEPTIC) mouth spray 1 spray  1 spray Mouth/Throat PRN Brown, Blaine K, PA-C       methocarbamol  (ROBAXIN ) tablet 500 mg  500 mg Oral Q6H PRN Brown, Blaine K, PA-C   500 mg at 01/16/24 9344   Or   methocarbamol  (ROBAXIN ) injection 500 mg  500 mg Intravenous Q6H PRN Brown, Blaine K, PA-C       morphine  (PF) 2 MG/ML injection 0.5-1 mg  0.5-1 mg Intravenous Q4H PRN Delores, Blaine K, PA-C       naloxone  (NARCAN ) injection 0.4 mg  0.4 mg Intravenous PRN Delores, Blaine K, PA-C       ondansetron  (ZOFRAN ) tablet 4 mg  4 mg Oral Q6H PRN Brown, Blaine K, PA-C       Or   ondansetron  (ZOFRAN ) injection 4 mg  4 mg Intravenous Q6H PRN Delores, Blaine K, PA-C       polyethylene glycol (MIRALAX  / GLYCOLAX ) packet 17 g  17 g Oral Daily Brown, Blaine K, PA-C   17 g at 01/15/24 0859   polyethylene glycol (MIRALAX  / GLYCOLAX ) packet 17 g  17 g Oral Daily PRN Brown, Blaine K, PA-C       senna (SENOKOT) tablet 8.6 mg  1 tablet Oral BID Brown, Blaine  K, PA-C   8.6 mg at 01/16/24 9097   traZODone  (DESYREL ) tablet 50 mg  50 mg Oral QHS Brown, Blaine K, PA-C   50 mg at 01/15/24 2030     Discharge Medications: Please see discharge summary for a list of discharge medications.  Relevant Imaging Results:  Relevant Lab Results:   Additional Information SSN: [id number]  [name], LCSW

## 2024-01-16 NOTE — Progress Notes (Signed)
 Progress Note   Patient: Samantha Henderson FMW:[id number] DOB: 1934-11-24 DOA: 01/13/2024     3 DOS: the patient was seen and examined on 01/16/2024   Brief hospital course: Samantha Henderson is a 89 y.o. female with medical history significant of paroxysmal A-fib on Eliquis , HFpEF, PFO, hypertension, hyperlipidemia, hypothyroidism, left femoral neck fracture status post left hip pinning in December 2017, right total knee replacement in January 2025 polymyalgia rheumatica, temporal arteritis presenting with complaints of pain in her right hip and knee after a mechanical fall.   She was noted to have impacted subcapital right femoral neck fracture, admitted to [facility] service with orthopedic consultation.  Assessment and Plan: Right femoral neck fracture Secondary to a mechanical fall.   S/p Right hip percutaneous pinning 01/13/25. Orthopedic follow up appreciated. continue Eliquis  home dose Continue pain control.   WBAT, PT OT follow up. She will need SNF placement.  Acute on CKD stage III A- Kidney function better. Avoid nephrotoxic drugs. Hold hydrochlorothiazide . Monitor daily renal function.   Mild hypokalemia Improved with supplementation.   Paroxysmal A-fib Continue telemetry.  Resumed eliquis .  Continue diltiazem .   Chronic HFpEF No signs of volume overload.   Resumed Cardizem .   Hypertension Continue diltiazem . Hold hydrochlorothiazide  due to AKI.   Monitor blood pressure closely.   Hyperlipidemia Continue atorvastatin .   Hypothyroidism Continue levothyroxine .   Chronic constipation Continue home MiraLAX .   Mood disorder Continue trazodone .     Out of bed to chair. Incentive spirometry. Nursing supportive care. Fall, aspiration precautions. Diet:  Diet Orders (From admission, onward)     Start     Ordered   01/14/24 1737  Diet regular Room service appropriate? Yes; Fluid consistency: Thin  Diet effective now       Question Answer Comment  Room service  appropriate? Yes   Fluid consistency: Thin      01/14/24 1737           DVT prophylaxis: apixaban  (ELIQUIS ) tablet 2.5 mg Start: 01/15/24 1000 SCDs Start: 01/14/24 1737 SCDs Start: 01/14/24 0023 apixaban  (ELIQUIS ) tablet 2.5 mg  Level of care: Telemetry Medical   Code Status: Do not attempt resuscitation (DNR) PRE-ARREST INTERVENTIONS DESIRED  Subjective: Patient is seen and examined today morning.  She denies any pain, eating fair. Did sit in chair for a while yesterday.  Physical Exam: Vitals:   01/16/24 0438 01/16/24 0743 01/16/24 0918 01/16/24 1612  BP: 107/65 (!) 106/59 (!) 106/59 (!) 130/58  Pulse: (!) 55 (!) 41  61  Resp: 16 16  16   Temp: 98 F (36.7 C) 98.2 F (36.8 C)  98.2 F (36.8 C)  TempSrc:  Oral  Oral  SpO2: 96% 93%  95%  Weight:      Height:        General - Elderly Caucasian female, no apparent distress HEENT - PERRLA, EOMI, atraumatic head, non tender sinuses. Lung - Clear, no rales, rhonchi, wheezes. Heart - S1, S2 heard, no murmurs, rubs, no pedal edema. Abdomen - Soft, non tender, bowel sounds good Neuro - Alert, awake and oriented, non focal exam. Skin - Warm and dry. Right hip dressing noted.  Data Reviewed:      Latest Ref Rng & Units 01/16/2024    5:26 AM 01/15/2024    4:43 AM 01/14/2024    4:30 AM  CBC  WBC 4.0 - 10.5 K/uL 10.2  7.9  10.7   Hemoglobin 12.0 - 15.0 g/dL 87.6  86.7  87.0   Hematocrit 36.0 -  46.0 % 37.0  40.3  39.6   Platelets 150 - 400 K/uL PLATELET CLUMPS NOTED ON SMEAR, UNABLE TO ESTIMATE  PLATELET CLUMPS NOTED ON SMEAR, UNABLE TO ESTIMATE  173       Latest Ref Rng & Units 01/16/2024    5:26 AM 01/15/2024    4:43 AM 01/14/2024    4:30 AM  BMP  Glucose 70 - 99 mg/dL 895  818  889   BUN 8 - 23 mg/dL 16  21  24    Creatinine 0.44 - 1.00 mg/dL 8.80  8.55  8.53   Sodium 135 - 145 mmol/L 140  135  136   Potassium 3.5 - 5.1 mmol/L 4.3  4.0  4.2   Chloride 98 - 111 mmol/L 106  101  103   CO2 22 - 32 mmol/L 24  23  25     Calcium  8.9 - 10.3 mg/dL 9.0  8.4  9.0    No results found.   Family Communication: Discussed with patient, she understand and agree. All questions answered.  Disposition: Status is: Inpatient Remains inpatient appropriate because: PT/ OT, pain control, placement.  Planned Discharge Destination: Skilled nursing facility     Time spent: 41 minutes  Author: Concepcion Riser, MD 01/16/2024 5:49 PM Secure chat 7am to 7pm For on call review [email or web address].

## 2024-01-16 NOTE — Progress Notes (Signed)
 Orthopaedic Trauma Progress Note  SUBJECTIVE: Today's patient's birthday.  Doing fairly well this morning.  Pain controlled.  States she is able to get a good nights rest.  No chest pain. No SOB. No nausea/vomiting. No other complaints.  Has not eaten breakfast yet this morning but is hungry.  No family at bedside currently.  OBJECTIVE:  Vitals:   01/16/24 0438 01/16/24 0743  BP: 107/65 (!) 106/59  Pulse: (!) 55 (!) 41  Resp: 16 16  Temp: 98 F (36.7 C) 98.2 F (36.8 C)  SpO2: 96% 93%    Opiates Today (MME): Today's  total administered Morphine  Milligram Equivalents: 20 Opiates Yesterday (MME): Yesterday's total administered Morphine  Milligram Equivalents: 30  General: Sitting up in bed, no acute distress.  Pleasant and cooperative Respiratory: No increased work of breathing.  Operative Extremity (right lower extremity): Dressings clean, dry, intact.  Tenderness over the hip as expected.  Otherwise no significant tenderness throughout the remainder of the leg.  No calf tenderness.  Ankle DF/PF intact.  Endorses sensation light touch over all aspects of the foot.  Neurovascularly intact.  IMAGING: Stable intraoperative films  LABS:  Results for orders placed or performed during the hospital encounter of 01/13/24 (from the past 24 hours)  Basic metabolic panel     Status: Abnormal   Collection Time: 01/16/24  5:26 AM  Result Value Ref Range   Sodium 140 135 - 145 mmol/L   Potassium 4.3 3.5 - 5.1 mmol/L   Chloride 106 98 - 111 mmol/L   CO2 24 22 - 32 mmol/L   Glucose, Bld 104 (H) 70 - 99 mg/dL   BUN 16 8 - 23 mg/dL   Creatinine, Ser 8.80 (H) 0.44 - 1.00 mg/dL   Calcium  9.0 8.9 - 10.3 mg/dL   GFR, Estimated 44 (L) >60 mL/min   Anion gap 10 5 - 15  CBC     Status: None   Collection Time: 01/16/24  5:26 AM  Result Value Ref Range   WBC 10.2 4.0 - 10.5 K/uL   RBC 4.15 3.87 - 5.11 MIL/uL   Hemoglobin 12.3 12.0 - 15.0 g/dL   HCT 62.9 63.9 - 53.9 %   MCV 89.2 80.0 - 100.0 fL    MCH 29.6 26.0 - 34.0 pg   MCHC 33.2 30.0 - 36.0 g/dL   RDW 87.0 88.4 - 84.4 %   Platelets PLATELET CLUMPS NOTED ON SMEAR, UNABLE TO ESTIMATE 150 - 400 K/uL   nRBC 0.0 0.0 - 0.2 %    ASSESSMENT: Samantha Henderson is a 89 y.o. female, 2 Days Post-Op s/p fall Procedures: FIXATION, FEMUR, NECK, PERCUTANEOUS, USING SCREW  CV/Blood loss: Hemoglobin 12.3 this morning.  Stable.  Hemodynamically stable  PLAN: Weightbearing: WBAT RLE ROM: Unrestricted ROM Incisional and dressing care: Reinforce dressings as needed  Showering: Okay to shower and get incisions wet Orthopedic device(s): None  Pain management: Continue current regimen VTE prophylaxis: Home dose Eliquis .  Continue SCDs ID: Perioperative ABX complete Foley/Lines:  No foley, KVO IVFs Dispo: PT/OT evaluation ongoing.  Patient was at home and does not have a safe discharge plan in place.  Will need SNF at discharge.  TOC following for placement.  Okay for discharge from ortho standpoint once cleared by medicine team and therapies   Follow - up plan: 2 weeks after d/c with Dr. Josefina for wound check and repeat x-rays   Contact information: 01/16/2024 through 01/17/2024 Franky Light MD, Lauraine Moores PA-C. After hours and holidays please check  [email or web address] for group call information for [facility]   Lauraine PATRIC Moores, PA-C [phone] (office) [email or web address]

## 2024-01-17 DIAGNOSIS — M810 Age-related osteoporosis without current pathological fracture: Secondary | ICD-10-CM | POA: Diagnosis not present

## 2024-01-17 DIAGNOSIS — I5032 Chronic diastolic (congestive) heart failure: Secondary | ICD-10-CM | POA: Diagnosis not present

## 2024-01-17 DIAGNOSIS — E876 Hypokalemia: Secondary | ICD-10-CM | POA: Diagnosis not present

## 2024-01-17 DIAGNOSIS — F4321 Adjustment disorder with depressed mood: Secondary | ICD-10-CM | POA: Diagnosis not present

## 2024-01-17 DIAGNOSIS — R2681 Unsteadiness on feet: Secondary | ICD-10-CM | POA: Diagnosis not present

## 2024-01-17 DIAGNOSIS — Z7401 Bed confinement status: Secondary | ICD-10-CM | POA: Diagnosis not present

## 2024-01-17 DIAGNOSIS — S72001D Fracture of unspecified part of neck of right femur, subsequent encounter for closed fracture with routine healing: Secondary | ICD-10-CM | POA: Diagnosis not present

## 2024-01-17 DIAGNOSIS — N1831 Chronic kidney disease, stage 3a: Secondary | ICD-10-CM | POA: Diagnosis not present

## 2024-01-17 DIAGNOSIS — I130 Hypertensive heart and chronic kidney disease with heart failure and stage 1 through stage 4 chronic kidney disease, or unspecified chronic kidney disease: Secondary | ICD-10-CM | POA: Diagnosis not present

## 2024-01-17 DIAGNOSIS — I10 Essential (primary) hypertension: Secondary | ICD-10-CM | POA: Diagnosis not present

## 2024-01-17 DIAGNOSIS — M6281 Muscle weakness (generalized): Secondary | ICD-10-CM | POA: Diagnosis not present

## 2024-01-17 DIAGNOSIS — Z96651 Presence of right artificial knee joint: Secondary | ICD-10-CM | POA: Diagnosis not present

## 2024-01-17 DIAGNOSIS — I129 Hypertensive chronic kidney disease with stage 1 through stage 4 chronic kidney disease, or unspecified chronic kidney disease: Secondary | ICD-10-CM | POA: Diagnosis not present

## 2024-01-17 DIAGNOSIS — M25551 Pain in right hip: Secondary | ICD-10-CM | POA: Diagnosis not present

## 2024-01-17 DIAGNOSIS — I4819 Other persistent atrial fibrillation: Secondary | ICD-10-CM | POA: Diagnosis not present

## 2024-01-17 DIAGNOSIS — S79919A Unspecified injury of unspecified hip, initial encounter: Secondary | ICD-10-CM | POA: Diagnosis not present

## 2024-01-17 DIAGNOSIS — R2689 Other abnormalities of gait and mobility: Secondary | ICD-10-CM | POA: Diagnosis not present

## 2024-01-17 DIAGNOSIS — G8918 Other acute postprocedural pain: Secondary | ICD-10-CM | POA: Diagnosis not present

## 2024-01-17 DIAGNOSIS — F5104 Psychophysiologic insomnia: Secondary | ICD-10-CM | POA: Diagnosis not present

## 2024-01-17 DIAGNOSIS — S72001A Fracture of unspecified part of neck of right femur, initial encounter for closed fracture: Secondary | ICD-10-CM | POA: Diagnosis not present

## 2024-01-17 DIAGNOSIS — T8484XA Pain due to internal orthopedic prosthetic devices, implants and grafts, initial encounter: Secondary | ICD-10-CM | POA: Diagnosis not present

## 2024-01-17 LAB — CBC
HCT: 36.7 % (ref 36.0–46.0)
Hemoglobin: 11.8 g/dL — ABNORMAL LOW (ref 12.0–15.0)
MCH: 29.2 pg (ref 26.0–34.0)
MCHC: 32.2 g/dL (ref 30.0–36.0)
MCV: 90.8 fL (ref 80.0–100.0)
Platelets: UNDETERMINED K/uL (ref 150–400)
RBC: 4.04 MIL/uL (ref 3.87–5.11)
RDW: 13 % (ref 11.5–15.5)
WBC: 7.6 K/uL (ref 4.0–10.5)
nRBC: 0 % (ref 0.0–0.2)

## 2024-01-17 MED ORDER — FERROUS SULFATE 325 (65 FE) MG PO TABS: 325.0000 mg | ORAL_TABLET | Freq: Every day | ORAL | 1 refills | Status: AC

## 2024-01-17 MED ORDER — POLYETHYLENE GLYCOL 3350 17 G PO PACK: 17.0000 g | PACK | Freq: Every day | ORAL | 0 refills | Status: AC | PRN

## 2024-01-17 MED ORDER — DOCUSATE SODIUM 100 MG PO CAPS: 100.0000 mg | ORAL_CAPSULE | Freq: Two times a day (BID) | ORAL | 0 refills | Status: AC

## 2024-01-17 NOTE — TOC Progression Note (Signed)
 Transition of Care [facility]) - Progression Note    Patient Details  Name: Samantha Henderson MRN: [id number] Date of Birth: June 07, 1934  Transition of [facility]) CM/SW Contact  Dino CHRISTELLA Au, LCSWA Phone Number: 01/17/2024, 9:58 AM  Clinical Narrative:     SW spoke with HTA ([phone]) reports pt was approved for SNF and PTAR. Information was provided to bedside [facility], LPN.   Per MD, pt ready for d/c.   Update 1016am SW received callback from [facility] ([facility]), SNF Auth # [id number], PTAR [phone]  Update 1223pm D/c orders and summary in.   SW spoke with [facility]) Room: 706p Call Report: [phone]  SW updated pt at bedside.   PTAR called.    Expected Discharge Plan: Skilled Nursing Facility Barriers to Discharge: Continued Medical Work up, SNF Pending bed offer               Expected Discharge Plan and Services In-house Referral: Clinical Social Work   Post Acute Care Choice: Skilled Nursing Facility Living arrangements for the past 2 months: Single Family Home                                       Social Drivers of Health (SDOH) Interventions SDOH Screenings   Food Insecurity: No Food Insecurity (01/14/2024)  Housing: Low Risk  (01/14/2024)  Transportation Needs: No Transportation Needs (01/14/2024)  Utilities: Not At Risk (01/14/2024)  Social Connections: Unknown (01/14/2024)  Recent Concern: Social Connections - Socially Isolated (01/14/2024)  Tobacco Use: Low Risk  (01/14/2024)    Readmission Risk Interventions     No data to display

## 2024-01-17 NOTE — TOC Transition Note (Signed)
 Transition of Care [facility]) - Discharge Note   Patient Details  Name: Samantha Henderson MRN: [id number] Date of Birth: 06/01/1934  Transition of Care [facility]) CM/SW Contact:  Dino CHRISTELLA Au, LCSWA Phone Number: 01/17/2024, 1:35 PM   Clinical Narrative:     Pt d/c to [facility] via PTAR.   Pt notified her daughter. SW assisted pt with packing her belongings at bedside.   Final next level of care: Skilled Nursing Facility Barriers to Discharge: Barriers Resolved   Patient Goals and CMS Choice Patient states their goals for this hospitalization and ongoing recovery are:: do what I've been doing   Choice offered to / list presented to : Patient (requesting [name])      Discharge Placement              Patient chooses bed at: [facility] Patient to be transferred to facility by: PTAR Name of family member notified: Pt notified daughter Patient and family notified of of transfer: 01/17/24  Discharge Plan and Services Additional resources added to the After Visit Summary for   In-house Referral: Clinical Social Work   Post Acute Care Choice: Skilled Nursing Facility                               Social Drivers of Health (SDOH) Interventions SDOH Screenings   Food Insecurity: No Food Insecurity (01/14/2024)  Housing: Low Risk  (01/14/2024)  Transportation Needs: No Transportation Needs (01/14/2024)  Utilities: Not At Risk (01/14/2024)  Social Connections: Unknown (01/14/2024)  Recent Concern: Social Connections - Socially Isolated (01/14/2024)  Tobacco Use: Low Risk  (01/14/2024)     Readmission Risk Interventions     No data to display

## 2024-01-17 NOTE — Progress Notes (Signed)
 CCMD called patient's HR 38 non-sustanined. Notified Dr. Concepcion Riser. Per MD hold Cardizem .

## 2024-01-17 NOTE — Plan of Care (Signed)
  Problem: Education: Goal: Knowledge of General Education information will improve Description: Including pain rating scale, medication(s)/side effects and non-pharmacologic comfort measures Outcome: Adequate for Discharge   Problem: Health Behavior/Discharge Planning: Goal: Ability to manage health-related needs will improve Outcome: Adequate for Discharge   Problem: Clinical Measurements: Goal: Ability to maintain clinical measurements within normal limits will improve Outcome: Adequate for Discharge Goal: Will remain free from infection Outcome: Adequate for Discharge Goal: Diagnostic test results will improve Outcome: Adequate for Discharge Goal: Respiratory complications will improve Outcome: Adequate for Discharge Goal: Cardiovascular complication will be avoided Outcome: Adequate for Discharge   Problem: Activity: Goal: Risk for activity intolerance will decrease Outcome: Adequate for Discharge   Problem: Nutrition: Goal: Adequate nutrition will be maintained Outcome: Adequate for Discharge   Problem: Coping: Goal: Level of anxiety will decrease Outcome: Adequate for Discharge   Problem: Elimination: Goal: Will not experience complications related to bowel motility Outcome: Adequate for Discharge Goal: Will not experience complications related to urinary retention Outcome: Adequate for Discharge   Problem: Pain Managment: Goal: General experience of comfort will improve and/or be controlled Outcome: Adequate for Discharge   Problem: Safety: Goal: Ability to remain free from injury will improve Outcome: Adequate for Discharge   Problem: Skin Integrity: Goal: Risk for impaired skin integrity will decrease Outcome: Adequate for Discharge   Problem: Education: Goal: Verbalization of understanding the information provided (i.e., activity precautions, restrictions, etc) will improve Outcome: Adequate for Discharge Goal: Individualized Educational  Video(s) Outcome: Adequate for Discharge   Problem: Activity: Goal: Ability to ambulate and perform ADLs will improve Outcome: Adequate for Discharge   Problem: Clinical Measurements: Goal: Postoperative complications will be avoided or minimized Outcome: Adequate for Discharge   Problem: Self-Concept: Goal: Ability to maintain and perform role responsibilities to the fullest extent possible will improve Outcome: Adequate for Discharge   Problem: Pain Management: Goal: Pain level will decrease Outcome: Adequate for Discharge

## 2024-01-17 NOTE — Discharge Summary (Signed)
 Physician Discharge Summary   Patient: Samantha Henderson MRN: [id number] DOB: 03/02/1935  Admit date:     01/13/2024  Discharge date: 01/17/24  Discharge Physician: Concepcion Riser   PCP: Cleotilde Planas, MD   Recommendations at discharge:    PCP follow up as scheduled. Follow up in 2 weeks after d/c with Dr. Josefina for wound check and repeat x-rays  Discharge Diagnoses: Principal Problem:   Hip fracture [facility]) Active Problems:   Persistent atrial fibrillation (HCC)   Benign essential HTN   AKI (acute kidney injury)   Hypokalemia   Closed displaced fracture of right femoral neck (HCC)  Resolved Problems:   * No resolved hospital problems. *  Hospital Course: Samantha Henderson is a 89 y.o. female with medical history significant of paroxysmal A-fib on Eliquis , HFpEF, PFO, hypertension, hyperlipidemia, hypothyroidism, left femoral neck fracture status post left hip pinning in December 2017, right total knee replacement in January 2025 polymyalgia rheumatica, temporal arteritis presenting with complaints of pain in her right hip and knee after a mechanical fall.    She was noted to have impacted subcapital right femoral neck fracture, admitted to [facility] service with orthopedic consultation.   Assessment and Plan: Right femoral neck fracture Secondary to a mechanical fall.   S/p Right hip percutaneous pinning 01/13/25. Orthopedic follow up appreciated. Continue Eliquis  home dose for DVT ppx. Continue pain control, norco prescribed. WBAT, PT OT follow up at [facility] facility. Follow up in 2 weeks after d/c with Dr. Josefina for wound check and repeat x-rays PCP follow up in 1 week.   Acute on CKD stage III A- Kidney function better. Avoid nephrotoxic drugs. Hold hydrochlorothiazide . Monitor renal function as outpatient.   Mild hypokalemia Improved with supplementation.   Paroxysmal A-fib She has slow HR but asymptomatic. Resumed eliquis .  Continue diltiazem . Outpatient cardiology follow  up.   Chronic HFpEF No signs of volume overload.   Resumed Cardizem .   Hypertension Continue diltiazem . Hold hydrochlorothiazide  due to AKI.   Monitor blood pressure closely.   Hyperlipidemia Continue atorvastatin .   Hypothyroidism Continue levothyroxine .   Chronic constipation Continue home MiraLAX .   Mood disorder Continue trazodone .        Consultants: orthopedic surgery Procedures performed:  FIXATION, Right femur neck, PERCUTANEOUS, USING SCREW   Disposition: Skilled nursing facility Diet recommendation:  Discharge Diet Orders (From admission, onward)     Start     Ordered   01/17/24 0000  Diet - low sodium heart healthy        01/17/24 1148           Cardiac diet DISCHARGE MEDICATION: Allergies as of 01/17/2024       Reactions   Niacin And Related Palpitations   Amlodipine  Swelling   Cefdinir Swelling   Patient tolerated Ancef  on 05/13/2022   Celecoxib Rash   Codeine Nausea And Vomiting   Nebivolol Hcl Swelling   Pregabalin Swelling   Ciprofloxacin Swelling   Hives  Tongue Sweeling   Lisinopril Cough   Plaquenil  [hydroxychloroquine ] Other (See Comments)   Low platelets   Sulfamethoxazole-trimethoprim    Rash   Augmentin [amoxicillin-pot Clavulanate] Rash   Cephalexin Rash   Patient tolerated Ancef  on 05/13/2022   Sulfonamide Derivatives Rash   Rash        Medication List     STOP taking these medications    hydrochlorothiazide  12.5 MG capsule Commonly known as: MICROZIDE    traMADol  50 MG tablet Commonly known as: ULTRAM   TAKE these medications    atorvastatin  20 MG tablet Commonly known as: LIPITOR Take 1 tablet (20 mg total) by mouth daily.   Biotin  10 MG Caps Take 10 mg by mouth daily.   calcium  carbonate 750 MG chewable tablet Commonly known as: TUMS EX Chew 1 tablet by mouth daily as needed for heartburn.   diltiazem  120 MG 24 hr capsule Commonly known as: CARDIZEM  CD Take 1 capsule (120 mg total) by  mouth daily.   diltiazem  30 MG tablet Commonly known as: Cardizem  Take 1 tablet (30 mg total) by mouth daily as needed. For breakthrough atrial fibrillation.   docusate sodium  100 MG capsule Commonly known as: COLACE Take 1 capsule (100 mg total) by mouth 2 (two) times daily.   Eliquis  2.5 MG Tabs tablet Generic drug: apixaban  Take 1 tablet (2.5 mg total) by mouth 2 (two) times daily.   ferrous sulfate  325 (65 FE) MG tablet Take 1 tablet (325 mg total) by mouth daily with breakfast.   HYDROcodone -acetaminophen  5-325 MG tablet Commonly known as: NORCO/VICODIN Take 1-2 tablets by mouth every 4 (four) hours as needed for moderate pain (pain score 4-6).   levothyroxine  100 MCG tablet Commonly known as: SYNTHROID  Take 1 tablet (100 mcg total) by mouth daily.   Magnesium  250 MG Tabs Take 250 mg by mouth daily.   Melatonin 10 MG Tabs Take 10 mg by mouth at bedtime.   OMEGA-3 FISH OIL PO Take 1 capsule by mouth daily.   polyethylene glycol 17 g packet Commonly known as: MIRALAX  / GLYCOLAX  Take 17 g by mouth daily. What changed: Another medication with the same name was added. Make sure you understand how and when to take each.   polyethylene glycol 17 g packet Commonly known as: MIRALAX  / GLYCOLAX  Take 17 g by mouth daily as needed for mild constipation. What changed: You were already taking a medication with the same name, and this prescription was added. Make sure you understand how and when to take each.   traZODone  50 MG tablet Commonly known as: DESYREL  Take 50 mg by mouth at bedtime.   Vitamin D3 125 MCG (5000 UT) Caps Take 1 capsule by mouth daily.               Discharge Care Instructions  (From admission, onward)           Start     Ordered   01/17/24 0000  Leave dressing on - Keep it clean, dry, and intact until clinic visit        01/17/24 1148            Discharge Exam: Filed Weights   01/13/24 1727 01/14/24 1411  Weight: 52.2 kg 52.2  kg      01/17/2024    8:45 AM 01/17/2024    5:43 AM 01/16/2024    9:00 PM  Vitals with BMI  Systolic 123 152 880  Diastolic 71 74 65  Pulse 42 45 66    General - Elderly Caucasian female, no apparent distress HEENT - PERRLA, EOMI, atraumatic head, non tender sinuses. Lung - Clear, no rales, rhonchi, wheezes. Heart - S1, S2 heard, no murmurs, rubs, no pedal edema. Abdomen - Soft, non tender, bowel sounds good Neuro - Alert, awake and oriented, non focal exam. Skin - Warm and dry. Right hip dressing noted.  Condition at discharge: stable  The results of significant diagnostics from this hospitalization (including imaging, microbiology, ancillary and laboratory) are listed below for reference.  Imaging Studies: DG HIP UNILAT WITH PELVIS 2-3 VIEWS RIGHT Result Date: 01/14/2024 CLINICAL DATA:  Elective surgery. Fixation of the right femoral neck. EXAM: DG HIP (WITH OR WITHOUT PELVIS) 2-3V*R* COMPARISON:  01/13/2024 FINDINGS: Intraoperative fluoroscopy is utilized for surgical control purposes. Fluoroscopy time is recorded at 62 seconds. Dose 6.5 mGy. 2 spot fluoroscopic images are obtained. Spot fluoroscopic images demonstrate placement of 3 fixation screws across the subcapital fracture in the right femoral neck. Degenerative changes in the right hip. IMPRESSION: Intraoperative fluoroscopy is utilized for surgical control purposes during internal fixation of subcapital right femoral neck fracture. Electronically Signed   By: Elsie Gravely M.D.   On: 01/14/2024 19:29   DG C-Arm 1-60 Min-No Report Result Date: 01/14/2024 Fluoroscopy was utilized by the requesting physician.  No radiographic interpretation.   DG Knee Complete 4 Views Right Result Date: 01/13/2024 EXAM: 4 VIEW(S) XRAY OF THE KNEE 01/13/2024 06:46:00 PM COMPARISON: None available. CLINICAL HISTORY: Fall. EMS reports they arrive to patient's house due to mechanical fall from a 3 ft table that she was kneeling on about to  stand to dust the fall. FINDINGS: BONES AND JOINTS: Total knee arthroplasty hardware in place. No acute fracture. No focal osseous lesion. No joint dislocation. No significant joint effusion. SOFT TISSUES: The soft tissues are unremarkable. IMPRESSION: 1. No acute abnormalities. Electronically signed by: Pinkie Pebbles MD 01/13/2024 07:10 PM EDT RP Workstation: [id number]   DG Chest 1 View Result Date: 01/13/2024 EXAM: 1 VIEW(S) XRAY OF THE CHEST 01/13/2024 06:46:00 PM COMPARISON: 06/15/2007 CLINICAL HISTORY: Fall. EMS reports they arrive to patient's house due to mechanical fall from a 3 ft table that she was kneeling on about to stand to dust the fall. FINDINGS: LUNGS AND PLEURA: No focal pulmonary opacity. No pulmonary edema. No pleural effusion. No pneumothorax. HEART AND MEDIASTINUM: No acute abnormality of the cardiac and mediastinal silhouettes. BONES AND SOFT TISSUES: No acute osseous abnormality. IMPRESSION: 1. No acute abnormalities. Electronically signed by: Pinkie Pebbles MD 01/13/2024 07:05 PM EDT RP Workstation: [id number]   DG Hip Unilat W or Wo Pelvis 2-3 Views Right Result Date: 01/13/2024 EXAM: 3 VIEW(S) XRAY OF THE BILATERAL HIP 01/13/2024 06:46:00 PM COMPARISON: None available. CLINICAL HISTORY: fall. Table formatting from the original note was not included.; Images from the original note were not included.; EMS reports they arrive to pts house due to mechanical fall from a 32ft table that she was kneeling on about to stand to dust the fall. FINDINGS: BONES AND JOINTS: Right hip: Impacted subcapital fracture. Left hip: Status post ORIF (Open Reduction Internal Fixation). Bilateral hips: Mild degenerative changes. SOFT TISSUES: The soft tissues are unremarkable. IMPRESSION: 1. Impacted subcapital right femoral neck fracture. Electronically signed by: Pinkie Pebbles MD 01/13/2024 07:03 PM EDT RP Workstation: [id number]    Microbiology: Results for orders placed or performed during  the hospital encounter of 01/13/24  Surgical pcr screen     Status: None   Collection Time: 01/14/24  8:59 AM   Specimen: Nasal Mucosa; Nasal Swab  Result Value Ref Range Status   MRSA, PCR NEGATIVE NEGATIVE Final   Staphylococcus aureus NEGATIVE NEGATIVE Final    Comment: (NOTE) The Xpert SA Assay (FDA approved for NASAL specimens in patients 86 years of age and older), is one component of a comprehensive surveillance program. It is not intended to diagnose infection nor to guide or monitor treatment. Performed at [facility], [address] [place], [place] [address]     Labs: CBC: Recent  Labs  Lab 01/13/24 1750 01/14/24 0430 01/15/24 0443 01/16/24 0526 01/17/24 0349  WBC 8.5 10.7* 7.9 10.2 7.6  NEUTROABS 6.3  --   --   --   --   HGB 13.4 12.9 13.2 12.3 11.8*  HCT 39.5 39.6 40.3 37.0 36.7  MCV 88.4 90.2 90.6 89.2 90.8  PLT PLATELET CLUMPS NOTED ON SMEAR, UNABLE TO ESTIMATE 173 PLATELET CLUMPS NOTED ON SMEAR, UNABLE TO ESTIMATE PLATELET CLUMPS NOTED ON SMEAR, UNABLE TO ESTIMATE PLATELET CLUMPS NOTED ON SMEAR, UNABLE TO ESTIMATE   Basic Metabolic Panel: Recent Labs  Lab 01/13/24 2010 01/14/24 0430 01/15/24 0443 01/16/24 0526  NA 139 136 135 140  K 3.1* 4.2 4.0 4.3  CL 103 103 101 106  CO2 25 25 23 24   GLUCOSE 101* 110* 181* 104*  BUN 28* 24* 21 16  CREATININE 1.57* 1.46* 1.44* 1.19*  CALCIUM  10.0 9.0 8.4* 9.0  MG 2.0  --   --   --    Liver Function Tests: Recent Labs  Lab 01/13/24 2010  AST 25  ALT 24  ALKPHOS 39  BILITOT 1.3*  PROT 6.4*  ALBUMIN 4.1   CBG: No results for input(s): GLUCAP in the last 168 hours.  Discharge time spent: 34 minutes.  Signed: Concepcion Riser, MD [facility] 01/17/2024

## 2024-01-17 NOTE — Progress Notes (Signed)
 Lindajo GORMAN Sharps to be D/C'd  per MD order.  Discussed with the patient and all questions fully answered.  VSS, Skin clean, dry and intact without evidence of skin break down, no evidence of skin tears noted.  IV catheter discontinued intact. Site without signs and symptoms of complications. Dressing and pressure applied.  An After Visit Summary was printed and given to PTAR and prescriptions scripts.  Call report to [facility] at [phone] spoke with [facility], LPN. Provided direct phone number for any additional questions.

## 2024-01-18 DIAGNOSIS — I5032 Chronic diastolic (congestive) heart failure: Secondary | ICD-10-CM | POA: Diagnosis not present

## 2024-01-18 DIAGNOSIS — I130 Hypertensive heart and chronic kidney disease with heart failure and stage 1 through stage 4 chronic kidney disease, or unspecified chronic kidney disease: Secondary | ICD-10-CM | POA: Diagnosis not present

## 2024-01-18 DIAGNOSIS — S72001D Fracture of unspecified part of neck of right femur, subsequent encounter for closed fracture with routine healing: Secondary | ICD-10-CM | POA: Diagnosis not present

## 2024-01-18 DIAGNOSIS — N1831 Chronic kidney disease, stage 3a: Secondary | ICD-10-CM | POA: Diagnosis not present

## 2024-01-20 ENCOUNTER — Ambulatory Visit: Payer: Medicare HMO | Admitting: Internal Medicine

## 2024-01-22 DIAGNOSIS — M810 Age-related osteoporosis without current pathological fracture: Secondary | ICD-10-CM | POA: Diagnosis not present

## 2024-01-22 DIAGNOSIS — I129 Hypertensive chronic kidney disease with stage 1 through stage 4 chronic kidney disease, or unspecified chronic kidney disease: Secondary | ICD-10-CM | POA: Diagnosis not present

## 2024-01-22 DIAGNOSIS — S72001D Fracture of unspecified part of neck of right femur, subsequent encounter for closed fracture with routine healing: Secondary | ICD-10-CM | POA: Diagnosis not present

## 2024-01-22 DIAGNOSIS — N1831 Chronic kidney disease, stage 3a: Secondary | ICD-10-CM | POA: Diagnosis not present

## 2024-01-27 DIAGNOSIS — M25551 Pain in right hip: Secondary | ICD-10-CM | POA: Diagnosis not present

## 2024-01-27 DIAGNOSIS — Z96651 Presence of right artificial knee joint: Secondary | ICD-10-CM | POA: Diagnosis not present

## 2024-01-27 DIAGNOSIS — G8918 Other acute postprocedural pain: Secondary | ICD-10-CM | POA: Diagnosis not present

## 2024-02-04 DIAGNOSIS — S72001D Fracture of unspecified part of neck of right femur, subsequent encounter for closed fracture with routine healing: Secondary | ICD-10-CM | POA: Diagnosis not present

## 2024-02-04 DIAGNOSIS — N1831 Chronic kidney disease, stage 3a: Secondary | ICD-10-CM | POA: Diagnosis not present

## 2024-02-04 DIAGNOSIS — I130 Hypertensive heart and chronic kidney disease with heart failure and stage 1 through stage 4 chronic kidney disease, or unspecified chronic kidney disease: Secondary | ICD-10-CM | POA: Diagnosis not present

## 2024-02-04 DIAGNOSIS — I5032 Chronic diastolic (congestive) heart failure: Secondary | ICD-10-CM | POA: Diagnosis not present

## 2024-02-06 ENCOUNTER — Other Ambulatory Visit: Payer: Self-pay | Admitting: Cardiology

## 2024-02-06 ENCOUNTER — Other Ambulatory Visit: Payer: Self-pay | Admitting: Internal Medicine

## 2024-02-06 ENCOUNTER — Other Ambulatory Visit (HOSPITAL_COMMUNITY): Payer: Self-pay

## 2024-02-07 DIAGNOSIS — I491 Atrial premature depolarization: Secondary | ICD-10-CM | POA: Diagnosis not present

## 2024-02-07 DIAGNOSIS — I700 Atherosclerosis of aorta: Secondary | ICD-10-CM | POA: Diagnosis not present

## 2024-02-07 DIAGNOSIS — Z9181 History of falling: Secondary | ICD-10-CM | POA: Diagnosis not present

## 2024-02-07 DIAGNOSIS — I130 Hypertensive heart and chronic kidney disease with heart failure and stage 1 through stage 4 chronic kidney disease, or unspecified chronic kidney disease: Secondary | ICD-10-CM | POA: Diagnosis not present

## 2024-02-07 DIAGNOSIS — I8393 Asymptomatic varicose veins of bilateral lower extremities: Secondary | ICD-10-CM | POA: Diagnosis not present

## 2024-02-07 DIAGNOSIS — M353 Polymyalgia rheumatica: Secondary | ICD-10-CM | POA: Diagnosis not present

## 2024-02-07 DIAGNOSIS — I480 Paroxysmal atrial fibrillation: Secondary | ICD-10-CM | POA: Diagnosis not present

## 2024-02-07 DIAGNOSIS — N1831 Chronic kidney disease, stage 3a: Secondary | ICD-10-CM | POA: Diagnosis not present

## 2024-02-07 DIAGNOSIS — I5032 Chronic diastolic (congestive) heart failure: Secondary | ICD-10-CM | POA: Diagnosis not present

## 2024-02-07 DIAGNOSIS — I272 Pulmonary hypertension, unspecified: Secondary | ICD-10-CM | POA: Diagnosis not present

## 2024-02-07 DIAGNOSIS — Z96651 Presence of right artificial knee joint: Secondary | ICD-10-CM | POA: Diagnosis not present

## 2024-02-07 DIAGNOSIS — I083 Combined rheumatic disorders of mitral, aortic and tricuspid valves: Secondary | ICD-10-CM | POA: Diagnosis not present

## 2024-02-07 DIAGNOSIS — I10 Essential (primary) hypertension: Secondary | ICD-10-CM | POA: Diagnosis not present

## 2024-02-07 DIAGNOSIS — E039 Hypothyroidism, unspecified: Secondary | ICD-10-CM | POA: Diagnosis not present

## 2024-02-07 DIAGNOSIS — E785 Hyperlipidemia, unspecified: Secondary | ICD-10-CM | POA: Diagnosis not present

## 2024-02-07 DIAGNOSIS — Z7901 Long term (current) use of anticoagulants: Secondary | ICD-10-CM | POA: Diagnosis not present

## 2024-02-08 ENCOUNTER — Other Ambulatory Visit: Payer: Self-pay

## 2024-02-08 ENCOUNTER — Other Ambulatory Visit (HOSPITAL_COMMUNITY): Payer: Self-pay

## 2024-02-08 MED ORDER — LEVOTHYROXINE SODIUM 100 MCG PO TABS
100.0000 ug | ORAL_TABLET | Freq: Every day | ORAL | 1 refills | Status: DC
  Filled 2024-02-08: qty 90, 90d supply, fill #0

## 2024-02-10 ENCOUNTER — Other Ambulatory Visit (HOSPITAL_COMMUNITY): Payer: Self-pay

## 2024-02-10 ENCOUNTER — Telehealth: Payer: Self-pay | Admitting: Cardiology

## 2024-02-10 NOTE — Telephone Encounter (Signed)
 Spoke with pt regarding her medication. It was explained to pt that hydrochlorothiazide  was discontinued in the hospital due to acute onset CKD. Pt verbalized understanding. All questions if any were answered.

## 2024-02-10 NOTE — Telephone Encounter (Signed)
 Pt c/o medication issue:  1. Name of Medication hydrochlorothiazide  (MICROZIDE )   2. How are you currently taking this medication (dosage and times per day)? no  3. Are you having a reaction (difficulty breathing--STAT)? no  4. What is your medication issue? Pt does not understand why the medication was discontinued and would like a c/b from the nurse

## 2024-02-24 DIAGNOSIS — S72001D Fracture of unspecified part of neck of right femur, subsequent encounter for closed fracture with routine healing: Secondary | ICD-10-CM | POA: Diagnosis not present

## 2024-02-29 ENCOUNTER — Other Ambulatory Visit: Payer: Self-pay | Admitting: Cardiology

## 2024-02-29 DIAGNOSIS — I480 Paroxysmal atrial fibrillation: Secondary | ICD-10-CM

## 2024-03-01 ENCOUNTER — Other Ambulatory Visit: Payer: Self-pay

## 2024-03-01 ENCOUNTER — Other Ambulatory Visit (HOSPITAL_COMMUNITY): Payer: Self-pay

## 2024-03-01 MED ORDER — APIXABAN 2.5 MG PO TABS
2.5000 mg | ORAL_TABLET | Freq: Two times a day (BID) | ORAL | 1 refills | Status: AC
Start: 2024-03-01 — End: ?
  Filled 2024-03-01: qty 180, 90d supply, fill #0

## 2024-03-01 NOTE — Telephone Encounter (Signed)
 Prescription refill request for Eliquis  received. Indication: A-fib Last office visit: 07/15/2023 Scr: 1.19 (EPIC 01/16/2024) Age: 89  Weight: 52.2kg

## 2024-03-07 DIAGNOSIS — M810 Age-related osteoporosis without current pathological fracture: Secondary | ICD-10-CM | POA: Diagnosis not present

## 2024-03-07 DIAGNOSIS — Z681 Body mass index (BMI) 19 or less, adult: Secondary | ICD-10-CM | POA: Diagnosis not present

## 2024-03-07 DIAGNOSIS — Z8781 Personal history of (healed) traumatic fracture: Secondary | ICD-10-CM | POA: Diagnosis not present

## 2024-03-07 DIAGNOSIS — I129 Hypertensive chronic kidney disease with stage 1 through stage 4 chronic kidney disease, or unspecified chronic kidney disease: Secondary | ICD-10-CM | POA: Diagnosis not present

## 2024-03-07 DIAGNOSIS — N1832 Chronic kidney disease, stage 3b: Secondary | ICD-10-CM | POA: Diagnosis not present

## 2024-04-22 ENCOUNTER — Encounter (INDEPENDENT_AMBULATORY_CARE_PROVIDER_SITE_OTHER): Payer: HMO | Admitting: Ophthalmology

## 2024-04-22 DIAGNOSIS — H35033 Hypertensive retinopathy, bilateral: Secondary | ICD-10-CM | POA: Diagnosis not present

## 2024-04-22 DIAGNOSIS — H43813 Vitreous degeneration, bilateral: Secondary | ICD-10-CM | POA: Diagnosis not present

## 2024-04-22 DIAGNOSIS — H33303 Unspecified retinal break, bilateral: Secondary | ICD-10-CM | POA: Diagnosis not present

## 2024-04-22 DIAGNOSIS — I10 Essential (primary) hypertension: Secondary | ICD-10-CM

## 2024-04-26 ENCOUNTER — Ambulatory Visit: Admitting: Internal Medicine

## 2024-04-26 ENCOUNTER — Encounter: Payer: Self-pay | Admitting: Internal Medicine

## 2024-04-26 ENCOUNTER — Other Ambulatory Visit

## 2024-04-26 VITALS — BP 144/78 | Ht 66.0 in | Wt 115.0 lb

## 2024-04-26 DIAGNOSIS — E890 Postprocedural hypothyroidism: Secondary | ICD-10-CM

## 2024-04-26 NOTE — Patient Instructions (Addendum)
 Switch plain Biotin  vitamin  to  Hair, skin and nail  vitamin    Consider Rosemary hair oil and shampoo for hair loss        You are on levothyroxine  - which is your thyroid  hormone supplement. You MUST take this consistently.  You should take this first thing in the morning on an empty stomach with water . You should not take it with other medications. Wait to 1hr prior to eating. If you are taking any vitamins - please take these in the evening.   If you miss a dose, please take your missed dose the following day (double the dose for that day). You should have a pill box for ONLY levothyroxine  on your bedside table to help you remember to take your medications.

## 2024-04-26 NOTE — Progress Notes (Unsigned)
 "  Name: Samantha Henderson  MRN/ DOB: [id number], [birth year]    Age/ Sex: 89 y.o., female     PCP: Cleotilde Planas, MD   Reason for Endocrinology Evaluation: Postoperative Hypothyroidism      Initial Endocrinology Clinic Visit: 02/22/2018    PATIENT IDENTIFIER: Samantha Henderson is a 89 y.o., female with a past medical history of HTN, temporal arteritis, osteoporosis and A.Fib. She has followed with [facility] since 02/22/2018 for consultative assistance with management of her Hypothyroidism .   HISTORICAL SUMMARY: . 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 She was always on Levothyroxine  112 mcg up until 2019, when her doses have been gradually reduced due to low TS. On her initial visit to our office she was on Levothyroxine  50 mcg daily with symptomatic hypothyroidism. She was on Biotin  at the time. We have asked her to hold it for a few days with repeat TFT's consistent with hypothyroidism TSH 15.61 uIU/mL . We started her on Levothyroxine  100 mcg daily and she was asked to hold Biotin  for at least 3 days prior to her lab work in the future.    SUBJECTIVE:    Today (04/26/2024):  Samantha Henderson is here for her a follow up on hypothyroidism.   She continues to follow-up with cardiology for paroxysmal A-fib, nonrheumatic aortic valve insufficiency and HTN She continues to follow-up with rheumatology for temporal arteritis   Has been noted with weight loss  No local neck swelling  Has occasional palpitations  Has occasional tremors with intention, brother and sister with essential tremors  Has noted recent constipation - miralax  helps  Has Hair loss , she has been evaluated by dermatology, and was advised to obtain OTC medications that did not help with the patient  She continues to use biotin , she did not hold it at this time    Levothyroxine  100 mcg daily  HISTORY:  Past Medical History:  Past Medical History:  Diagnosis Date   Abdominal bruit    Abdominal  bruit in the past no aortic aneurysm by evaluation in the past   Agatston coronary artery calcium  score less than 100 09/2023   Coronary Ca score 63   Aortic atherosclerosis    Aortic regurgitation    Mild by echo 08/2023   Arm weakness    Arm weakness with exercise, January, 2012   Arthritis    Benign essential HTN 01/27/2017   Bradycardia    Carotid stenosis    1-39% bilateral by dopplers 09/2019   Constipation    Constipation with diltiazem  March, 2012   Diastolic dysfunction    Mild   Dizziness    HTN (hypertension)    Hypothyroidism    Leg cramps    from lipitor in the past   Mitral regurgitation 11/15/2018   mild echo 08/2023   PAC (premature atrial contraction)    Palpitation    Pericardial effusion    mild on echo 10/2022   PFO (patent foramen ovale)    Polymyalgia    Pulmonary hypertension (HCC)    Rheumatic fever    Questionable history of rheumatic fever, but no history of valvular abnormalities   Sinus tachycardia    Temporal arteritis (HCC)    Rule out temporal arteritis in the past   Tricuspid regurgitation    Moderate by echo 08/2023   Varicose veins of both lower extremities    Past Surgical History:  Past Surgical History:  Procedure Laterality Date  BACK SURGERY     BUNIONECTOMY     HIP PINNING,CANNULATED Left 04/12/2016   Procedure: CANNULATED HIP PINNING;  Surgeon: Fonda Olmsted, MD;  Location: [facility] OR;  Service: Orthopedics;  Laterality: Left;   HIP PINNING,CANNULATED Right 01/14/2024   Procedure: FIXATION, FEMUR, NECK, PERCUTANEOUS, USING SCREW;  Surgeon: Olmsted Fonda, MD;  Location: [facility] OR;  Service: Orthopedics;  Laterality: Right;  right hip percutaneous pinning   NECK SURGERY     TONSILLECTOMY     TOTAL ABDOMINAL HYSTERECTOMY     TOTAL KNEE ARTHROPLASTY Right 05/13/2022   Procedure: TOTAL KNEE ARTHROPLASTY;  Surgeon: Olmsted Fonda, MD;  Location: [facility] ORS;  Service: Orthopedics;  Laterality: Right;   Social History:  reports that she has never  smoked. She has never used smokeless tobacco. She reports that she does not drink alcohol and does not use drugs. Family History: family history includes Heart disease in her mother; Stroke in her mother.   HOME MEDICATIONS: Levothyroxine  100 mcg daily     OBJECTIVE:   PHYSICAL EXAM: VS: BP (!) 144/78   Ht 5' 6 (1.676 m)   Wt 115 lb (52.2 kg)   BMI 18.56 kg/m    EXAM: General: Pt appears well and is in NAD  Neck: General: Supple without adenopathy. Thyroid : No goiter or nodules appreciated.  Lungs: Clear with good BS bilat  Heart: Auscultation: RRR.  Extremities:  BL LE: No pretibial edema  Mental Status: Judgment, insight: Intact Orientation: Oriented to time, place, and person Mood and affect: No depression, anxiety, or agitation     DATA REVIEWED:  Latest Reference Range & Units 12/01/22 08:06  TSH 0.450 - 4.500 uIU/mL 1.330    ASSESSMENT / PLAN / RECOMMENDATIONS:   Post-Operative Hypothyroidism :  - Patient with hair thinning -  Pt educated extensively on the correct way to take levothyroxine  (first thing in the morning with water , 30 minutes before eating or taking other medications). - Pt encouraged to double dose the following day if she were to miss a dose given long half-life of levothyroxine . -Most recent TSH ****   Medications  Continue  Levothyroxine  100 mcg daily   2.  Hair thinning:   - Differential diagnosis includes thyroid  disease, genetics, nutritional deficiencies, and stress  -I have advised the patient to switch biotin  only vitamin to hair skin and nail vitamin - I have also advised the patient to consider Rosemary oil and shampoo  F/U in 1 yr   Signed electronically by: Stefano Redgie Butts, MD  [facility]  [facility] [address] [place] [place], [place] [address] Phone: [phone] FAX: [phone]      CC: Cleotilde Planas, MD [address] [place] [address] Phone:  [phone]  Fax: [phone]   Return to Endocrinology clinic as below: Future Appointments  Date Time Provider Department Center  04/28/2025  9:15 AM Alvia Norleen BIRCH, MD [facility] None    "

## 2024-04-27 ENCOUNTER — Ambulatory Visit: Payer: Self-pay | Admitting: Internal Medicine

## 2024-04-27 LAB — TSH: TSH: 4.4 m[IU]/L (ref 0.40–4.50)

## 2024-04-27 MED ORDER — LEVOTHYROXINE SODIUM 100 MCG PO TABS: 100.0000 ug | ORAL_TABLET | Freq: Every day | ORAL | 3 refills | Status: AC

## 2024-04-27 NOTE — Telephone Encounter (Signed)
 Please let the patient know her thyroid  function is within normal range and to continue levothyroxine  100 mcg daily.    A year supply was sent to the pharmacy    Thanks

## 2024-05-06 ENCOUNTER — Other Ambulatory Visit (HOSPITAL_COMMUNITY): Payer: Self-pay

## 2024-05-06 ENCOUNTER — Other Ambulatory Visit: Payer: Self-pay | Admitting: Internal Medicine

## 2024-05-06 ENCOUNTER — Other Ambulatory Visit: Payer: Self-pay

## 2024-05-06 MED ORDER — LEVOTHYROXINE SODIUM 100 MCG PO TABS
100.0000 ug | ORAL_TABLET | Freq: Every day | ORAL | 1 refills | Status: AC
  Filled 2024-05-06: qty 90, 90d supply, fill #0

## 2024-05-09 ENCOUNTER — Other Ambulatory Visit: Payer: Self-pay

## 2024-05-13 ENCOUNTER — Other Ambulatory Visit (HOSPITAL_COMMUNITY): Payer: Self-pay

## 2024-05-16 ENCOUNTER — Other Ambulatory Visit: Payer: Self-pay

## 2025-04-25 ENCOUNTER — Ambulatory Visit: Admitting: Internal Medicine

## 2025-04-28 ENCOUNTER — Encounter (INDEPENDENT_AMBULATORY_CARE_PROVIDER_SITE_OTHER): Admitting: Ophthalmology
